# Patient Record
Sex: Male | Born: 1960 | Race: White | Hispanic: No | Marital: Married | Smoking: Former smoker
Health system: Southern US, Community
[De-identification: ages and names within clinical notes are randomized; demographics above are authoritative.]

## PROBLEM LIST (undated history)

## (undated) DIAGNOSIS — I70221 Atherosclerosis of native arteries of extremities with rest pain, right leg: Secondary | ICD-10-CM

## (undated) DIAGNOSIS — L03031 Cellulitis of right toe: Secondary | ICD-10-CM

## (undated) DIAGNOSIS — J45909 Unspecified asthma, uncomplicated: Secondary | ICD-10-CM

## (undated) DIAGNOSIS — L02611 Cutaneous abscess of right foot: Secondary | ICD-10-CM

## (undated) DIAGNOSIS — F419 Anxiety disorder, unspecified: Secondary | ICD-10-CM

## (undated) DIAGNOSIS — F17200 Nicotine dependence, unspecified, uncomplicated: Secondary | ICD-10-CM

## (undated) DIAGNOSIS — I739 Peripheral vascular disease, unspecified: Secondary | ICD-10-CM

## (undated) DIAGNOSIS — I70219 Atherosclerosis of native arteries of extremities with intermittent claudication, unspecified extremity: Secondary | ICD-10-CM

## (undated) HISTORY — PX: TONSILLECTOMY: SUR1361

## (undated) HISTORY — PX: TONSILECTOMY, ADENOIDECTOMY, BILATERAL MYRINGOTOMY AND TUBES: SHX2538

## (undated) HISTORY — PX: STOMACH SURGERY: SHX791

## (undated) HISTORY — PX: BACK SURGERY: SHX140

## (undated) HISTORY — DX: Peripheral vascular disease, unspecified: I73.9

---

## 2005-11-02 ENCOUNTER — Emergency Department: Payer: Self-pay | Admitting: Emergency Medicine

## 2012-06-12 ENCOUNTER — Emergency Department: Payer: Self-pay | Admitting: Emergency Medicine

## 2016-10-25 ENCOUNTER — Other Ambulatory Visit: Payer: Self-pay | Admitting: Internal Medicine

## 2016-10-25 ENCOUNTER — Ambulatory Visit (HOSPITAL_COMMUNITY): Payer: Self-pay

## 2016-10-25 DIAGNOSIS — R0602 Shortness of breath: Secondary | ICD-10-CM

## 2019-11-11 ENCOUNTER — Other Ambulatory Visit (INDEPENDENT_AMBULATORY_CARE_PROVIDER_SITE_OTHER): Payer: Self-pay | Admitting: Nurse Practitioner

## 2019-11-11 ENCOUNTER — Encounter (INDEPENDENT_AMBULATORY_CARE_PROVIDER_SITE_OTHER): Payer: Self-pay | Admitting: Vascular Surgery

## 2019-11-11 ENCOUNTER — Encounter (INDEPENDENT_AMBULATORY_CARE_PROVIDER_SITE_OTHER): Payer: Self-pay

## 2019-11-11 ENCOUNTER — Other Ambulatory Visit: Payer: Self-pay

## 2019-11-11 ENCOUNTER — Ambulatory Visit (INDEPENDENT_AMBULATORY_CARE_PROVIDER_SITE_OTHER): Payer: Self-pay | Admitting: Vascular Surgery

## 2019-11-11 ENCOUNTER — Other Ambulatory Visit (INDEPENDENT_AMBULATORY_CARE_PROVIDER_SITE_OTHER): Payer: Self-pay | Admitting: Vascular Surgery

## 2019-11-11 DIAGNOSIS — I70213 Atherosclerosis of native arteries of extremities with intermittent claudication, bilateral legs: Secondary | ICD-10-CM

## 2019-11-11 DIAGNOSIS — R0989 Other specified symptoms and signs involving the circulatory and respiratory systems: Secondary | ICD-10-CM

## 2019-11-11 DIAGNOSIS — F17200 Nicotine dependence, unspecified, uncomplicated: Secondary | ICD-10-CM

## 2019-11-11 DIAGNOSIS — I70219 Atherosclerosis of native arteries of extremities with intermittent claudication, unspecified extremity: Secondary | ICD-10-CM | POA: Insufficient documentation

## 2019-11-11 NOTE — Progress Notes (Signed)
Patient ID: Brendan Williams, male   DOB: 07-09-1961, 59 y.o.   MRN: [id number]  Chief Complaint  Patient presents with  . New Patient (Initial Visit)    no femoral pulses    HPI Brendan Williams is a 58 y.o. male.  I am asked to see the patient by R. Whitten, PA-C for evaluation of nonpalpable femoral pulses.  Patient describes several months of worsening leg pain and weakness with activity.  He is starting to get to the point where his right foot and leg hurt all the time.  This was steadily progressive without a clear inciting event or causative factor.  No current open ulcerations or infection.  Has not really not sought medical care and has not had previous interventions to his lower extremities.  His primary care provider noticed no palpable pulses with his symptoms referred him for further evaluation and treatment.  His blood pressure has been high as of recent although he previously was reported to have good blood pressure.  He does smoke tobacco.  Had not sought medical care in many years so does not have an extensive past medical history.     PMH Tobacco use Leg pain   Family History  Problem Relation Age of Onset  . Leukemia Mother   . Heart disease Father   . Heart disease Sister   . Cancer Sister   . Cancer Brother     Social History   Tobacco Use  . Smoking status: Current Every Day Smoker    Packs/day: 1.00    Types: Cigarettes  . Smokeless tobacco: Never Used  Substance Use Topics  . Alcohol use: Not on file  . Drug use: Not on file     NKDA  Not currently on meds    REVIEW OF SYSTEMS (Negative unless checked)  Constitutional: [] Weight loss  [] Fever  [] Chills Cardiac: [] Chest pain   [] Chest pressure   [] Palpitations   [] Shortness of breath when laying flat   [] Shortness of breath at rest   [] Shortness of breath with exertion. Vascular:  [x] Pain in legs with walking   [] Pain in legs at rest   [] Pain in legs when laying flat   [x] Claudication    [x] Pain in feet when walking  [] Pain in feet at rest  [] Pain in feet when laying flat   [] History of DVT   [] Phlebitis   [] Swelling in legs   [] Varicose veins   [] Non-healing ulcers Pulmonary:   [] Uses home oxygen   [] Productive cough   [] Hemoptysis   [] Wheeze  [] COPD   [] Asthma Neurologic:  [] Dizziness  [] Blackouts   [] Seizures   [] History of stroke   [] History of TIA  [] Aphasia   [] Temporary blindness   [] Dysphagia   [] Weakness or numbness in arms   [x] Weakness or numbness in legs Musculoskeletal:  [] Arthritis   [] Joint swelling   [] Joint pain   [] Low back pain Hematologic:  [] Easy bruising  [] Easy bleeding   [] Hypercoagulable state   [] Anemic  [] Hepatitis Gastrointestinal:  [] Blood in stool   [] Vomiting blood  [] Gastroesophageal reflux/heartburn   [] Abdominal pain Genitourinary:  [] Chronic kidney disease   [] Difficult urination  [] Frequent urination  [] Burning with urination   [] Hematuria Skin:  [] Rashes   [] Ulcers   [] Wounds Psychological:  [] History of anxiety   []  History of major depression.    Physical Exam BP 132/84 (BP Location: Right Arm)   Pulse (!) 59   Resp 14   Ht 6' (1.829 m)  Wt 173 lb (78.5 kg)   BMI 23.46 kg/m  Gen:  WD/WN, NAD Head: [place]/AT, No temporalis wasting. Ear/Nose/Throat: Hearing grossly intact, nares w/o erythema or drainage, oropharynx w/o Erythema/Exudate Eyes: Conjunctiva clear, sclera non-icteric  Neck: trachea midline.  No JVD. No bruit Pulmonary:  Good air movement, respirations not labored, no use of accessory muscles  Cardiac: RRR, no JVD Vascular:  Vessel Right Left  Radial Palpable Palpable                      Fem  NP  NP  DP  NP  NP  PT  NP  NP   Gastrointestinal:. No masses, surgical incisions, or scars. Musculoskeletal: M/S 5/5 throughout.  No deformity or atrophy.  No edema. Neurologic: Sensation grossly intact in extremities.  Symmetrical.  Speech is fluent. Motor exam as listed above. Psychiatric: Judgment intact, Mood & affect  appropriate for pt's clinical situation. Dermatologic: No rashes or ulcers noted.  No cellulitis or open wounds.    Radiology No results found.  Labs No results found for this or any previous visit (from the past 2160 hour(s)).  Assessment/Plan:  Tobacco use disorder We had a discussion for approximately 3 minutes regarding the absolute need for smoking cessation due to the deleterious nature of tobacco on the vascular system. We discussed the tobacco use would diminish patency of any intervention, and likely significantly worsen progressio of disease. We discussed multiple agents for quitting including replacement therapy or medications to reduce cravings such as Chantix. The patient voices their understanding of the importance of smoking cessation.   Atherosclerosis of native arteries of extremity with intermittent claudication (HCC) The patient describes symptoms consistent with atherosclerosis with claudication of both lower extremities worse on the right than the left.  He sounds like he is getting rest pain on the right leg as well.  I discussed the pathophysiology and natural history of peripheral arterial disease and why causes symptoms.  I discussed other diagnoses in the differential such as neurogenic claudication.  Recommend:  Patient should undergo arterial duplex of the lower extremity ASAP because there has been a significant deterioration in the patient's lower extremity symptoms.  The patient states they are having increased pain and a marked decrease in the distance that they can walk.  The risks and benefits as well as the alternatives were discussed in detail with the patient.  All questions were answered.  Patient agrees to proceed and understands this could be a prelude to angiography and intervention.  The patient will follow up with me in the office to review the studies.       Festus Barren 11/11/2019, 11:37 AM   This note was created with Dragon medical  transcription system.  Any errors from dictation are unintentional.

## 2019-11-11 NOTE — Assessment & Plan Note (Signed)

## 2019-11-11 NOTE — Patient Instructions (Signed)
Peripheral Vascular Disease  Peripheral vascular disease (PVD) is a disease of the blood vessels that are not part of your heart and brain. A simple term for PVD is poor circulation. In most cases, PVD narrows the blood vessels that carry blood from your heart to the rest of your body. This can reduce the supply of blood to your arms, legs, and internal organs, like your stomach or kidneys. However, PVD most often affects a person's lower legs and feet. Without treatment, PVD tends to get worse. PVD can also lead to acute ischemic limb. This is when an arm or leg suddenly cannot get enough blood. This is a medical emergency. Follow these instructions at home: Lifestyle  Do not use any products that contain nicotine or tobacco, such as cigarettes and e-cigarettes. If you need help quitting, ask your doctor.  Lose weight if you are overweight. Or, stay at a healthy weight as told by your doctor.  Eat a diet that is low in fat and cholesterol. If you need help, ask your doctor.  Exercise regularly. Ask your doctor for activities that are right for you. General instructions  Take over-the-counter and prescription medicines only as told by your doctor.  Take good care of your feet: ? Wear comfortable shoes that fit well. ? Check your feet often for any cuts or sores.  Keep all follow-up visits as told by your doctor This is important. Contact a doctor if:  You have cramps in your legs when you walk.  You have leg pain when you are at rest.  You have coldness in a leg or foot.  Your skin changes.  You are unable to get or have an erection (erectile dysfunction).  You have cuts or sores on your feet that do not heal. Get help right away if:  Your arm or leg turns cold, numb, and blue.  Your arms or legs become red, warm, swollen, painful, or numb.  You have chest pain.  You have trouble breathing.  You suddenly have weakness in your face, arm, or leg.  You become very  confused or you cannot speak.  You suddenly have a very bad headache.  You suddenly cannot see. Summary  Peripheral vascular disease (PVD) is a disease of the blood vessels.  A simple term for PVD is poor circulation. Without treatment, PVD tends to get worse.  Treatment may include exercise, low fat and low cholesterol diet, and quitting smoking. This information is not intended to replace advice given to you by your health care provider. Make sure you discuss any questions you have with your health care provider. Document Revised: 09/14/2017 Document Reviewed: 11/09/2016 Elsevier Patient Education  2020 [employer]  

## 2019-11-11 NOTE — Assessment & Plan Note (Addendum)
The patient describes symptoms consistent with atherosclerosis with claudication of both lower extremities worse on the right than the left.  He sounds like he is getting rest pain on the right leg as well.  I discussed the pathophysiology and natural history of peripheral arterial disease and why causes symptoms.  I discussed other diagnoses in the differential such as neurogenic claudication.  Recommend:  Patient should undergo arterial duplex of the lower extremity ASAP because there has been a significant deterioration in the patient's lower extremity symptoms.  The patient states they are having increased pain and a marked decrease in the distance that they can walk.  The risks and benefits as well as the alternatives were discussed in detail with the patient.  All questions were answered.  Patient agrees to proceed and understands this could be a prelude to angiography and intervention.  The patient will follow up with me in the office to review the studies.

## 2019-11-12 ENCOUNTER — Encounter (INDEPENDENT_AMBULATORY_CARE_PROVIDER_SITE_OTHER): Payer: Self-pay | Admitting: Nurse Practitioner

## 2019-11-12 ENCOUNTER — Ambulatory Visit (INDEPENDENT_AMBULATORY_CARE_PROVIDER_SITE_OTHER): Payer: Self-pay

## 2019-11-12 ENCOUNTER — Ambulatory Visit (INDEPENDENT_AMBULATORY_CARE_PROVIDER_SITE_OTHER): Payer: Self-pay | Admitting: Nurse Practitioner

## 2019-11-12 ENCOUNTER — Encounter (INDEPENDENT_AMBULATORY_CARE_PROVIDER_SITE_OTHER): Payer: Self-pay

## 2019-11-12 VITALS — BP 102/75 | HR 55 | Resp 16 | Wt 175.0 lb

## 2019-11-12 DIAGNOSIS — I70213 Atherosclerosis of native arteries of extremities with intermittent claudication, bilateral legs: Secondary | ICD-10-CM

## 2019-11-12 DIAGNOSIS — F17200 Nicotine dependence, unspecified, uncomplicated: Secondary | ICD-10-CM

## 2019-11-12 DIAGNOSIS — I771 Stricture of artery: Secondary | ICD-10-CM

## 2019-11-12 DIAGNOSIS — R0989 Other specified symptoms and signs involving the circulatory and respiratory systems: Secondary | ICD-10-CM

## 2019-11-13 ENCOUNTER — Other Ambulatory Visit: Payer: Self-pay

## 2019-11-13 ENCOUNTER — Other Ambulatory Visit
Admission: RE | Admit: 2019-11-13 | Discharge: 2019-11-13 | Disposition: A | Discharge status: Home / Self Care | Payer: HRSA Program | Source: Ambulatory Visit | Attending: Vascular Surgery | Admitting: Vascular Surgery

## 2019-11-13 DIAGNOSIS — Z20822 Contact with and (suspected) exposure to covid-19: Secondary | ICD-10-CM | POA: Diagnosis not present

## 2019-11-13 DIAGNOSIS — Z01812 Encounter for preprocedural laboratory examination: Secondary | ICD-10-CM | POA: Diagnosis present

## 2019-11-13 LAB — SARS CORONAVIRUS 2 (TAT 6-24 HRS): SARS Coronavirus 2: NEGATIVE

## 2019-11-14 ENCOUNTER — Encounter (INDEPENDENT_AMBULATORY_CARE_PROVIDER_SITE_OTHER): Payer: Self-pay | Admitting: Nurse Practitioner

## 2019-11-14 ENCOUNTER — Encounter
Admission: RE | Disposition: A | Discharge status: Home / Self Care | Payer: Self-pay | Source: Home / Self Care | Attending: Vascular Surgery

## 2019-11-14 ENCOUNTER — Other Ambulatory Visit: Payer: Self-pay

## 2019-11-14 ENCOUNTER — Ambulatory Visit
Admission: RE | Admit: 2019-11-14 | Discharge: 2019-11-14 | Disposition: A | Discharge status: Home / Self Care | Payer: Self-pay | Attending: Vascular Surgery | Admitting: Vascular Surgery

## 2019-11-14 ENCOUNTER — Encounter: Payer: Self-pay | Admitting: Vascular Surgery

## 2019-11-14 DIAGNOSIS — I70223 Atherosclerosis of native arteries of extremities with rest pain, bilateral legs: Secondary | ICD-10-CM

## 2019-11-14 DIAGNOSIS — I70229 Atherosclerosis of native arteries of extremities with rest pain, unspecified extremity: Secondary | ICD-10-CM

## 2019-11-14 DIAGNOSIS — F17210 Nicotine dependence, cigarettes, uncomplicated: Secondary | ICD-10-CM | POA: Insufficient documentation

## 2019-11-14 HISTORY — PX: LOWER EXTREMITY ANGIOGRAPHY: CATH118251

## 2019-11-14 LAB — CREATININE, SERUM
Creatinine, Ser: 0.78 mg/dL (ref 0.61–1.24)
GFR calc Af Amer: >60 mL/min (ref >60)
GFR calc non Af Amer: >60 mL/min (ref >60)

## 2019-11-14 LAB — BUN: BUN: 7 mg/dL (ref 6–20)

## 2019-11-14 SURGERY — LOWER EXTREMITY ANGIOGRAPHY
Anesthesia: Moderate Sedation | Site: Leg Lower | Laterality: Right

## 2019-11-14 MED ORDER — CEFAZOLIN SODIUM-DEXTROSE 2-4 GM/100ML-% IV SOLN
2.0000 g | Freq: Once | INTRAVENOUS | Status: AC
  Administered 2019-11-14: 2 g via INTRAVENOUS

## 2019-11-14 MED ORDER — ATORVASTATIN CALCIUM 10 MG PO TABS: 10.0000 mg | ORAL_TABLET | Freq: Every day | ORAL | 11 refills | Status: DC

## 2019-11-14 MED ORDER — ONDANSETRON HCL 4 MG/2ML IJ SOLN: 4.0000 mg | Freq: Four times a day (QID) | INTRAMUSCULAR | Status: DC | PRN

## 2019-11-14 MED ORDER — ASPIRIN EC 81 MG PO TBEC: 81.0000 mg | DELAYED_RELEASE_TABLET | Freq: Every day | ORAL | 2 refills | Status: DC

## 2019-11-14 MED ORDER — SODIUM CHLORIDE 0.9 % IV SOLN: INTRAVENOUS | Status: DC

## 2019-11-14 MED ORDER — DIPHENHYDRAMINE HCL 50 MG/ML IJ SOLN
INTRAMUSCULAR | Status: DC | PRN
  Administered 2019-11-14: 50 mg via INTRAVENOUS

## 2019-11-14 MED ORDER — SODIUM CHLORIDE 0.9% FLUSH: 3.0000 mL | INTRAVENOUS | Status: DC | PRN

## 2019-11-14 MED ORDER — DIPHENHYDRAMINE HCL 50 MG/ML IJ SOLN
INTRAMUSCULAR | Status: AC
  Filled 2019-11-14: qty 1

## 2019-11-14 MED ORDER — CLOPIDOGREL BISULFATE 75 MG PO TABS
ORAL_TABLET | ORAL | Status: AC
  Administered 2019-11-14: 300 mg via ORAL
  Filled 2019-11-14: qty 4

## 2019-11-14 MED ORDER — HEPARIN SODIUM (PORCINE) 1000 UNIT/ML IJ SOLN
INTRAMUSCULAR | Status: AC
  Filled 2019-11-14: qty 1

## 2019-11-14 MED ORDER — ATORVASTATIN CALCIUM 10 MG PO TABS
10.0000 mg | ORAL_TABLET | Freq: Every day | ORAL | Status: DC
  Administered 2019-11-14: 10 mg via ORAL
  Filled 2019-11-14: qty 1

## 2019-11-14 MED ORDER — ASPIRIN EC 81 MG PO TBEC
DELAYED_RELEASE_TABLET | ORAL | Status: AC
  Administered 2019-11-14: 81 mg via ORAL
  Filled 2019-11-14: qty 1

## 2019-11-14 MED ORDER — SODIUM CHLORIDE 0.9 % IV SOLN: 250.0000 mL | INTRAVENOUS | Status: DC | PRN

## 2019-11-14 MED ORDER — SODIUM CHLORIDE 0.9% FLUSH: 3.0000 mL | Freq: Two times a day (BID) | INTRAVENOUS | Status: DC

## 2019-11-14 MED ORDER — FENTANYL CITRATE (PF) 100 MCG/2ML IJ SOLN
INTRAMUSCULAR | Status: AC
  Filled 2019-11-14: qty 2

## 2019-11-14 MED ORDER — HYDRALAZINE HCL 20 MG/ML IJ SOLN: 5.0000 mg | INTRAMUSCULAR | Status: DC | PRN

## 2019-11-14 MED ORDER — HYDROMORPHONE HCL 1 MG/ML IJ SOLN: 1.0000 mg | Freq: Once | INTRAMUSCULAR | Status: AC | PRN

## 2019-11-14 MED ORDER — ASPIRIN EC 81 MG PO TBEC: 81.0000 mg | DELAYED_RELEASE_TABLET | Freq: Every day | ORAL | Status: DC

## 2019-11-14 MED ORDER — ACETAMINOPHEN 325 MG PO TABS: 650.0000 mg | ORAL_TABLET | ORAL | Status: DC | PRN

## 2019-11-14 MED ORDER — CLOPIDOGREL BISULFATE 300 MG PO TABS: 300.0000 mg | ORAL_TABLET | Freq: Once | ORAL | 0 refills | Status: AC

## 2019-11-14 MED ORDER — METHYLPREDNISOLONE SODIUM SUCC 125 MG IJ SOLR: 125.0000 mg | Freq: Once | INTRAMUSCULAR | Status: DC | PRN

## 2019-11-14 MED ORDER — MIDAZOLAM HCL 2 MG/2ML IJ SOLN
INTRAMUSCULAR | Status: DC | PRN
  Administered 2019-11-14 (×3): 1 mg via INTRAVENOUS
  Administered 2019-11-14: 2 mg via INTRAVENOUS

## 2019-11-14 MED ORDER — HYDROMORPHONE HCL 1 MG/ML IJ SOLN
INTRAMUSCULAR | Status: AC
  Administered 2019-11-14: 1 mg via INTRAVENOUS
  Filled 2019-11-14: qty 1

## 2019-11-14 MED ORDER — DIPHENHYDRAMINE HCL 50 MG/ML IJ SOLN: 50.0000 mg | Freq: Once | INTRAMUSCULAR | Status: DC | PRN

## 2019-11-14 MED ORDER — LABETALOL HCL 5 MG/ML IV SOLN: 10.0000 mg | INTRAVENOUS | Status: DC | PRN

## 2019-11-14 MED ORDER — HEPARIN SODIUM (PORCINE) 1000 UNIT/ML IJ SOLN
INTRAMUSCULAR | Status: DC | PRN
  Administered 2019-11-14: 5000 [IU] via INTRAVENOUS

## 2019-11-14 MED ORDER — CLOPIDOGREL BISULFATE 75 MG PO TABS: 75.0000 mg | ORAL_TABLET | Freq: Every day | ORAL | Status: DC

## 2019-11-14 MED ORDER — FAMOTIDINE 20 MG PO TABS: 40.0000 mg | ORAL_TABLET | Freq: Once | ORAL | Status: DC | PRN

## 2019-11-14 MED ORDER — CLOPIDOGREL BISULFATE 75 MG PO TABS: 75.0000 mg | ORAL_TABLET | Freq: Every day | ORAL | 11 refills | Status: DC

## 2019-11-14 MED ORDER — ONDANSETRON HCL 4 MG/2ML IJ SOLN
INTRAMUSCULAR | Status: AC
  Administered 2019-11-14: 4 mg via INTRAVENOUS
  Filled 2019-11-14: qty 2

## 2019-11-14 MED ORDER — MIDAZOLAM HCL 5 MG/5ML IJ SOLN
INTRAMUSCULAR | Status: AC
  Filled 2019-11-14: qty 5

## 2019-11-14 MED ORDER — CLOPIDOGREL BISULFATE 75 MG PO TABS
ORAL_TABLET | ORAL | Status: AC
  Filled 2019-11-14: qty 1

## 2019-11-14 MED ORDER — MIDAZOLAM HCL 2 MG/ML PO SYRP: 8.0000 mg | ORAL_SOLUTION | Freq: Once | ORAL | Status: DC | PRN

## 2019-11-14 MED ORDER — FENTANYL CITRATE (PF) 100 MCG/2ML IJ SOLN
INTRAMUSCULAR | Status: DC | PRN
  Administered 2019-11-14 (×6): 25 ug via INTRAVENOUS
  Administered 2019-11-14: 50 ug via INTRAVENOUS

## 2019-11-14 MED ORDER — CLOPIDOGREL BISULFATE 75 MG PO TABS: 300.0000 mg | ORAL_TABLET | Freq: Once | ORAL | Status: AC

## 2019-11-14 SURGICAL SUPPLY — 28 items
BALLN LUTONIX DCB 5X100X130 (BALLOONS) ×2
BALLN LUTONIX DCB 6X100X130 (BALLOONS) ×2
BALLN ULTRVRSE 7X150X75 (BALLOONS) ×2
BALLN ULTRVRSE 7X80X75 (BALLOONS) ×2
BALLOON LUTONIX DCB 5X100X130 (BALLOONS) ×1 IMPLANT
BALLOON LUTONIX DCB 6X100X130 (BALLOONS) ×1 IMPLANT
BALLOON ULTRVRSE 7X150X75 (BALLOONS) ×1 IMPLANT
BALLOON ULTRVRSE 7X80X75 (BALLOONS) ×1 IMPLANT
CANNULA 5F STIFF (CANNULA) ×2 IMPLANT
CATH BEACON 5 .035 40 KMP TP (CATHETERS) ×1 IMPLANT
CATH BEACON 5 .035 65 RIM TIP (CATHETERS) ×2 IMPLANT
CATH BEACON 5 .038 40 KMP TP (CATHETERS) ×1
CATH PIG 70CM (CATHETERS) ×2 IMPLANT
DEVICE PRESTO INFLATION (MISCELLANEOUS) ×4 IMPLANT
DEVICE STARCLOSE SE CLOSURE (Vascular Products) ×2 IMPLANT
GLIDEWIRE ADV .035X180CM (WIRE) ×2 IMPLANT
PACK ANGIOGRAPHY (CUSTOM PROCEDURE TRAY) ×2 IMPLANT
SHEATH BRITE TIP 5FRX11 (SHEATH) ×2 IMPLANT
SHEATH BRITE TIP 6FRX11 (SHEATH) ×4 IMPLANT
SHEATH BRITE TIP 7FRX11 (SHEATH) ×2 IMPLANT
STENT LIFESTAR 8X60X80 (Permanent Stent) ×2 IMPLANT
STENT LIFESTAR 8X80X80 (Permanent Stent) ×4 IMPLANT
STENT LIFESTREAM 7X26X80 (Permanent Stent) ×2 IMPLANT
STENT LIFESTREAM 8X26X80 (Permanent Stent) ×2 IMPLANT
SYR MEDRAD MARK 7 150ML (SYRINGE) ×2 IMPLANT
TUBING CONTRAST HIGH PRESS 72 (TUBING) ×2 IMPLANT
WIRE J 3MM .035X145CM (WIRE) ×2 IMPLANT
WIRE MAGIC TOR.035 180C (WIRE) ×2 IMPLANT

## 2019-11-14 NOTE — Progress Notes (Signed)
Pt pulses absent in bilateral feet x 2. Dr. Wyn Quaker at bedside aware. No orders at this time.  Per MD [facility] for Patient to D/C after 2 hrs bedrest if pain controlled. Will monitor closely.

## 2019-11-14 NOTE — Progress Notes (Signed)
SUBJECTIVE:  Patient ID: Brendan Williams, male    DOB: [birth year], 59 y.o.   MRN: [id number] Chief Complaint  Patient presents with  . Follow-up    stat ultrasound follow up    HPI  Brendan Williams is a 59 y.o. male that presents today for follow-up stat ultrasound studies.  The patient was referred by his primary care provider due to having nonpalpable femoral pulses.  The patient has had several months of worsening leg pain and weakness with the right leg being far worse than the left.  He states that the pain happens when he walks and he has a  very short ambulation distance before he begins to experience claudication.  He denies any previous interventions.  The patient also smokes tobacco on a regular basis.  Today the patient has an ABI of 0.60 on the right and 0.75 on the left.  The patient has a toe pressure of 48 on the right and 66 on the left.  Limited duplex shows dampened monophasic flow in the common femoral, superficial femoral and popliteal arteries with monophasic flow reversal in the profundofemoral artery via collateralization.  There is no flow detected in the external iliac artery region.  The left lower extremity shows monophasic flow in the left common femoral, profunda, superficial femoral and popliteal arteries.  No flow was visualized in the external iliac artery region with prominent superficial collateral noted.  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    History reviewed. No pertinent past medical history.  Past Surgical History:  Procedure Laterality Date  . BACK SURGERY     lower ruptured disk  . STOMACH SURGERY    . TONSILECTOMY, ADENOIDECTOMY, BILATERAL MYRINGOTOMY AND TUBES      Social History   Socioeconomic History  . Marital status: Married    Spouse name: Not on file  . Number of children: Not on file  . Years of education: Not on file  . Highest education level: Not on file  Occupational History  . Not on file    Tobacco Use  . Smoking status: Current Every Day Smoker    Packs/day: 1.00    Types: Cigarettes  . Smokeless tobacco: Never Used  Substance and Sexual Activity  . Alcohol use: Not on file  . Drug use: Not on file  . Sexual activity: Not on file  Other Topics Concern  . Not on file  Social History Narrative  . Not on file   Social Determinants of Health   Financial Resource Strain:   . Difficulty of Paying Living Expenses: Not on file  Food Insecurity:   . Worried About Charity fundraiser in the Last Year: Not on file  . Ran Out of Food in the Last Year: Not on file  Transportation Needs:   . Lack of Transportation (Medical): Not on file  . Lack of Transportation (Non-Medical): Not on file  Physical Activity:   . Days of Exercise per Week: Not on file  . Minutes of Exercise per Session: Not on file  Stress:   . Feeling of Stress : Not on file  Social Connections:   . Frequency of Communication with Friends and Family: Not on file  . Frequency of Social Gatherings with Friends and Family: Not on file  . Attends Religious Services: Not on file  . Active Member of Clubs or Organizations: Not on file  . Attends Archivist Meetings: Not on file  . Marital Status: Not  on file  Intimate Partner Violence:   . Fear of Current or Ex-Partner: Not on file  . Emotionally Abused: Not on file  . Physically Abused: Not on file  . Sexually Abused: Not on file    Family History  Problem Relation Age of Onset  . Leukemia Mother   . Heart disease Father   . Heart disease Sister   . Cancer Sister   . Cancer Brother     No Known Allergies   Review of Systems   Review of Systems: Negative Unless Checked Constitutional: [] Weight loss  [] Fever  [] Chills Cardiac: [] Chest pain   []  Atrial Fibrillation  [] Palpitations   [] Shortness of breath when laying flat   [] Shortness of breath with exertion. [] Shortness of breath at rest Vascular:  [x] Pain in legs with walking    [x] Pain in legs with standing [x] Pain in legs when laying flat   [x] Claudication    [] Pain in feet when laying flat    [] History of DVT   [] Phlebitis   [] Swelling in legs   [] Varicose veins   [] Non-healing ulcers Pulmonary:   [] Uses home oxygen   [] Productive cough   [] Hemoptysis   [] Wheeze  [] COPD   [] Asthma Neurologic:  [] Dizziness   [] Seizures  [] Blackouts [] History of stroke   [] History of TIA  [] Aphasia   [] Temporary Blindness   [] Weakness or numbness in arm   [] Weakness or numbness in leg Musculoskeletal:   [] Joint swelling   [] Joint pain   [] Low back pain  []  History of Knee Replacement [] Arthritis [] back Surgeries  []  Spinal Stenosis    Hematologic:  [] Easy bruising  [] Easy bleeding   [] Hypercoagulable state   [] Anemic Gastrointestinal:  [] Diarrhea   [] Vomiting  [] Gastroesophageal reflux/heartburn   [] Difficulty swallowing. [] Abdominal pain Genitourinary:  [] Chronic kidney disease   [] Difficult urination  [] Anuric   [] Blood in urine [] Frequent urination  [] Burning with urination   [] Hematuria Skin:  [] Rashes   [] Ulcers [] Wounds Psychological:  [] History of anxiety   []  History of major depression  []  Memory Difficulties      OBJECTIVE:   Physical Exam  BP 102/75 (BP Location: Right Arm)   Pulse (!) 55   Resp 16   Wt 175 lb (79.4 kg)   BMI 23.73 kg/m   Gen: WD/WN, NAD Head: [place]/AT, No temporalis wasting.  Ear/Nose/Throat: Hearing grossly intact, nares w/o erythema or drainage Eyes: PER, EOMI, sclera nonicteric.  Neck: Supple, no masses.  No JVD.  Pulmonary:  Good air movement, no use of accessory muscles.  Cardiac: RRR Vascular:  Vessel Right Left  Radial Palpable Palpable  Dorsalis Pedis Not Palpable Not Palpable  Posterior Tibial Not Palpable Not Palpable   Gastrointestinal: soft, non-distended. No guarding/no peritoneal signs.  Musculoskeletal: M/S 5/5 throughout.  No deformity or atrophy.  Neurologic: Pain and light touch intact in extremities.  Symmetrical.  Speech is  fluent. Motor exam as listed above. Psychiatric: Judgment intact, Mood & affect appropriate for pt's clinical situation. Dermatologic: No Venous rashes. No Ulcers Noted.  No changes consistent with cellulitis. Lymph : No Cervical lymphadenopathy, no lichenification or skin changes of chronic lymphedema.       ASSESSMENT AND PLAN:  1. Atherosclerosis of native artery of both lower extremities with intermittent claudication (HCC) Recommend:  The patient has evidence of severe atherosclerotic changes of both lower extremities with rest pain that is associated with preulcerative changes and impending tissue loss of the foot.  This represents a limb threatening ischemia and places the patient  at the risk for limb loss.  Patient should undergo angiography of the lower extremities with the hope for intervention for limb salvage.  The risks and benefits as well as the alternative therapies was discussed in detail with the patient.  All questions were answered.  Patient agrees to proceed with angiography.  The patient will follow up with me in the office after the procedure.       2. Tobacco use disorder Smoking cessation was discussed, 3-10 minutes spent on this topic specifically   3. Subclavian artery stenosis (HCC) >30 mmHG difference in the bilateral arm blood pressures.  Patient has no complaints today but his leg is taking a more urgent precedent.  Will re assess at follow up visit   No current outpatient medications on file prior to visit.   No current facility-administered medications on file prior to visit.    There are no Patient Instructions on file for this visit. No follow-ups on file.   Georgiana Spinner, NP  This note was completed with Office manager.  Any errors are purely unintentional.

## 2019-11-14 NOTE — Op Note (Signed)
[facility]  Percutaneous Study/Intervention Procedural Note   Date of Surgery: 11/14/2019  Surgeon(s):Deedee Lybarger    Assistants:none  Pre-operative Diagnosis: PAD with rest pain bilateral lower extremities  Post-operative diagnosis:  Same  Procedure(s) Performed:             1.  Ultrasound guidance for vascular access bilateral femoral artery             2.  Catheter placement into aorta from bilateral femoral approaches             3.  Aortogram and selective bilateral lower extremity angiogram             4.  Percutaneous transluminal angioplasty of right external iliac artery with 6 mm diameter Lutonix drug-coated balloon             5.   Percutaneous transluminal angioplasty of the left external iliac artery with 5 mm diameter Lutonix drug-coated balloon  6.  Kissing balloon stent placements to bilateral common iliac arteries with an 8 mm diameter by 26 mm length stent on the right and a 7 mm diameter by 26 mm length stent on the left  7.  Additional stent placement to the right external iliac artery with 8 mm diameter by 8 cm length life star stent  8.  Additional stent placement to the left common and external iliac artery with an 8 mm diameter by 8 cm length life star stent into the left external iliac artery with a 7 mm diameter by 6 cm length life star stent  9.  Angioplasty of the right common femoral artery with 7 mm diameter angioplasty balloon             10.  StarClose closure device left femoral artery  EBL: 10 cc  Contrast: 110 cc  Fluoro Time: 19.7 minutes  Moderate Conscious Sedation Time: approximately 50 minutes using 5 mg of Versed and 200 mcg of Fentanyl              Indications:  Patient is a 59 y.o.male with rest pain in both legs. The patient has noninvasive study showing very poor monophasic flow bilaterally. The patient is brought in for angiography for further evaluation and potential treatment.  Due to the limb threatening nature  of the situation, angiogram was performed for attempted limb salvage. The patient is aware that if the procedure fails, amputation would be expected.  The patient also understands that even with successful revascularization, amputation may still be required due to the severity of the situation.  Risks and benefits are discussed and informed consent is obtained.   Procedure:  The patient was identified and appropriate procedural time out was performed.  The patient was then placed supine on the table and prepped and draped in the usual sterile fashion. Moderate conscious sedation was administered during a face to face encounter with the patient throughout the procedure with my supervision of the RN administering medicines and monitoring the patient's vital signs, pulse oximetry, telemetry and mental status throughout from the start of the procedure until the patient was taken to the recovery room. Ultrasound was used to evaluate the left common femoral artery.  It was patent but significantly diseased.  A digital ultrasound image was acquired.  A micropuncture puncture needle was used to access the left common femoral artery under direct ultrasound guidance and a permanent image was performed.   A micropuncture wire and sheath were then placed but these were only  go and partially.  There was an occlusion down to the distal external iliac artery and I then crossed this with an advantage wire a 5Fr sheath was placed.  Pigtail catheter was placed into the aorta and an AP aortogram was performed to crossing the occlusion. This demonstrated extensive iliac disease with occlusion of both external iliac arteries and high-grade stenosis versus occlusion of the common iliac arteries.  The renal arteries were widely patent.  The aorta itself was patent but it was disease distally.  Attempts at crossing the right iliac occlusion from the left femoral approach were not successful, so I elected to access the right femoral  artery.  This was done in the distal common femoral artery.  This was done with ultrasound and the artery was accessed under direct visualization with a micropuncture needle.  A micropuncture wire and sheath were placed and upsized to a 6 [place] sheath.  Using the advantage wire and a Kumpe catheter was able to cross the iliac occlusion and confirm intraluminal flow in the aorta.  Selective bilateral lower extremity angiogram was then performed through the existing sheaths. This demonstrated short segment occlusion in the left SFA with what appeared to be more significant disease in the left popliteal artery.  Distal opacification was extremely poor but there appeared to be at least 1 vessel runoff.  On the right side, there was a long segment right SFA occlusion, occlusion of the right common femoral artery below the sheath and involving the origins of the profunda femoris artery and proximal superficial femoral artery.  Distal opacification was extremely poor and nothing was seen below the knee to assess his runoff due to the poor inflow. It was felt that it was in the patient's best interest to proceed with intervention after these images to avoid a second procedure and a larger amount of contrast and fluoroscopy based off of the findings from the initial angiogram. The patient was systemically heparinized. I then used a Kumpe catheter and the advantage wire to get into the aorta bilaterally and confirm intraluminal flow.  The external iliac arteries were heavily calcified and predilatation with a 5 mm Lutonix drug-coated balloon and a 6 mm diameter Lutonix drug-coated balloon were performed on the left and right respectively.  We then proceeded with stent placement in a kissing stent fashion in the common iliac artery for the proximal disease that was high-grade on the right and nearly occlusive on the left.  An 8 mm diameter by 26 mm length stent was placed on the right and a 7 mm diameter by 26 mm length stent  was placed on the left and they were both inflated to 14 atm.  The remainder of the right common iliac artery down to the bifurcation was normal but the external remained highly stenotic after angioplasty.  An 8 mm diameter by 8 cm length life star stent was then deployed from the right iliac bifurcation down to just above the femoral head.  This was postdilated with a 7 mm balloon.  There remained occlusion of the femoral artery and I pulled the sheath back to the very distal femoral artery and used the 7 mm balloon to angioplasty the right common femoral artery in the proximal and mid segment inflated to 8 atm for 1 minute.  Completion imaging showed it now to be patent to the stent with less than 20% stenosis but there remained occlusion at and below the stent in the common femoral artery on the right.  This  involved the origins of the profunda and SFA.  As such, closure device was not deployed on the right and the sheath was removed with pressure held.  The remainder of the disease in the left leg was then addressed within the iliac arteries.  An 8 mm diameter by 8 cm length life star stent was deployed from the edge of the Lifestream stent down to the proximal external iliac artery.  A 7 mm diameter by 6 cm length life star stent was then deployed to the top of the femoral head bridging up to the stent in the proximal external iliac artery.  These were postdilated with 7 mm balloon with excellent angiographic completion result and less than 10% residual stenosis.. I elected to terminate the procedure. The sheath was removed and StarClose closure device was deployed in the left femoral artery with excellent hemostatic result. The patient was taken to the recovery room in stable condition having tolerated the procedure well.  Findings:               Aortogram:  Extensive iliac disease with occlusion of both external iliac arteries and high-grade stenosis versus occlusion of the common iliac arteries.  The  renal arteries were widely patent.  The aorta itself was patent but it was disease distally.             Right Lower Extremity:  there was a long segment right SFA occlusion, occlusion of the right common femoral artery below the sheath and involving the origins of the profunda femoris artery and proximal superficial femoral artery.  Distal opacification was extremely poor and nothing was seen below the knee to assess his runoff due to the poor inflow  Left Lower Extremity:  This demonstrated short segment occlusion in the left SFA with what appeared to be more significant disease in the left popliteal artery.  Distal opacification was extremely poor but there appeared to be at least 1 vessel runoff.  The common femoral artery appeared to be at least moderately diseased although it was difficult to discern with the sheath in place.   Disposition: Patient was taken to the recovery room in stable condition having tolerated the procedure well.  Complications: None  Leotis Pain 11/14/2019 4:46 PM   This note was created with Dragon Medical transcription system. Any errors in dictation are purely unintentional.

## 2019-11-14 NOTE — H&P (Signed)
[facility] History & Physical Update  The patient was interviewed and re-examined.  The patient's previous History and Physical has been reviewed and is unchanged.  There is no change in the plan of care. We plan to proceed with the scheduled procedure.  Festus Barren, MD  11/14/2019, 1:05 PM

## 2019-11-17 ENCOUNTER — Telehealth (INDEPENDENT_AMBULATORY_CARE_PROVIDER_SITE_OTHER): Payer: Self-pay | Admitting: Vascular Surgery

## 2019-11-17 ENCOUNTER — Encounter: Payer: Self-pay | Admitting: Cardiology

## 2019-11-18 ENCOUNTER — Telehealth (INDEPENDENT_AMBULATORY_CARE_PROVIDER_SITE_OTHER): Payer: Self-pay

## 2019-11-18 NOTE — Telephone Encounter (Signed)
Patient wife left voicemail stating that her husband had angio on 11/14/19 and was having pain. The wife is requesting for pain medication to be called into pharmacy for relief. I spoke with Dr Wyn Quaker and he advise I could call in Tramadol 50mg  1 tablet every 6 hours prn #30 with 1 refill and I have made the patient wife aware that prescription will be called into pharmacy.

## 2019-11-24 ENCOUNTER — Telehealth (INDEPENDENT_AMBULATORY_CARE_PROVIDER_SITE_OTHER): Payer: Self-pay

## 2019-11-24 NOTE — Telephone Encounter (Signed)
A referral has been sent to [facility] for a cardiac clearance for this patient for surgery with Dr. Wyn Quaker. Annice Needy, MD  Dominga Ferry, CMA  Patient will need bilateral femoral endarterectomies and bilateral SFA and possible tibial interventions

## 2019-11-25 ENCOUNTER — Other Ambulatory Visit (INDEPENDENT_AMBULATORY_CARE_PROVIDER_SITE_OTHER): Payer: Self-pay | Admitting: Vascular Surgery

## 2019-11-25 ENCOUNTER — Inpatient Hospital Stay
Admission: AD | Admit: 2019-11-25 | Discharge: 2019-12-04 | DRG: 271 | Disposition: A | Discharge status: Home Health | Payer: Self-pay | Source: Ambulatory Visit | Attending: Hospitalist | Admitting: Hospitalist

## 2019-11-25 ENCOUNTER — Ambulatory Visit (INDEPENDENT_AMBULATORY_CARE_PROVIDER_SITE_OTHER): Payer: Self-pay | Admitting: Vascular Surgery

## 2019-11-25 ENCOUNTER — Encounter (INDEPENDENT_AMBULATORY_CARE_PROVIDER_SITE_OTHER): Payer: Self-pay | Admitting: Vascular Surgery

## 2019-11-25 ENCOUNTER — Other Ambulatory Visit: Payer: Self-pay

## 2019-11-25 VITALS — BP 147/87 | HR 68 | Resp 14 | Wt 171.0 lb

## 2019-11-25 DIAGNOSIS — E785 Hyperlipidemia, unspecified: Secondary | ICD-10-CM | POA: Diagnosis present

## 2019-11-25 DIAGNOSIS — F17210 Nicotine dependence, cigarettes, uncomplicated: Secondary | ICD-10-CM | POA: Diagnosis present

## 2019-11-25 DIAGNOSIS — G9349 Other encephalopathy: Secondary | ICD-10-CM | POA: Diagnosis not present

## 2019-11-25 DIAGNOSIS — R509 Fever, unspecified: Secondary | ICD-10-CM

## 2019-11-25 DIAGNOSIS — R231 Pallor: Secondary | ICD-10-CM | POA: Diagnosis present

## 2019-11-25 DIAGNOSIS — I70229 Atherosclerosis of native arteries of extremities with rest pain, unspecified extremity: Secondary | ICD-10-CM | POA: Diagnosis present

## 2019-11-25 DIAGNOSIS — E440 Moderate protein-calorie malnutrition: Secondary | ICD-10-CM | POA: Diagnosis present

## 2019-11-25 DIAGNOSIS — I70221 Atherosclerosis of native arteries of extremities with rest pain, right leg: Secondary | ICD-10-CM

## 2019-11-25 DIAGNOSIS — E1152 Type 2 diabetes mellitus with diabetic peripheral angiopathy with gangrene: Secondary | ICD-10-CM | POA: Diagnosis present

## 2019-11-25 DIAGNOSIS — Z7982 Long term (current) use of aspirin: Secondary | ICD-10-CM

## 2019-11-25 DIAGNOSIS — Z79899 Other long term (current) drug therapy: Secondary | ICD-10-CM

## 2019-11-25 DIAGNOSIS — F1290 Cannabis use, unspecified, uncomplicated: Secondary | ICD-10-CM | POA: Diagnosis present

## 2019-11-25 DIAGNOSIS — I70261 Atherosclerosis of native arteries of extremities with gangrene, right leg: Secondary | ICD-10-CM | POA: Diagnosis present

## 2019-11-25 DIAGNOSIS — T82856A Stenosis of peripheral vascular stent, initial encounter: Secondary | ICD-10-CM | POA: Diagnosis present

## 2019-11-25 DIAGNOSIS — B9562 Methicillin resistant Staphylococcus aureus infection as the cause of diseases classified elsewhere: Secondary | ICD-10-CM | POA: Diagnosis present

## 2019-11-25 DIAGNOSIS — I739 Peripheral vascular disease, unspecified: Secondary | ICD-10-CM | POA: Diagnosis present

## 2019-11-25 DIAGNOSIS — K5900 Constipation, unspecified: Secondary | ICD-10-CM | POA: Diagnosis present

## 2019-11-25 DIAGNOSIS — Z6823 Body mass index (BMI) 23.0-23.9, adult: Secondary | ICD-10-CM

## 2019-11-25 DIAGNOSIS — Z09 Encounter for follow-up examination after completed treatment for conditions other than malignant neoplasm: Secondary | ICD-10-CM

## 2019-11-25 DIAGNOSIS — F17200 Nicotine dependence, unspecified, uncomplicated: Secondary | ICD-10-CM

## 2019-11-25 DIAGNOSIS — S91301A Unspecified open wound, right foot, initial encounter: Secondary | ICD-10-CM

## 2019-11-25 DIAGNOSIS — D649 Anemia, unspecified: Secondary | ICD-10-CM | POA: Diagnosis not present

## 2019-11-25 DIAGNOSIS — F10231 Alcohol dependence with withdrawal delirium: Secondary | ICD-10-CM | POA: Diagnosis not present

## 2019-11-25 DIAGNOSIS — Z7902 Long term (current) use of antithrombotics/antiplatelets: Secondary | ICD-10-CM

## 2019-11-25 DIAGNOSIS — L97519 Non-pressure chronic ulcer of other part of right foot with unspecified severity: Secondary | ICD-10-CM | POA: Diagnosis present

## 2019-11-25 DIAGNOSIS — Z01818 Encounter for other preprocedural examination: Secondary | ICD-10-CM

## 2019-11-25 DIAGNOSIS — I10 Essential (primary) hypertension: Secondary | ICD-10-CM | POA: Diagnosis present

## 2019-11-25 DIAGNOSIS — I745 Embolism and thrombosis of iliac artery: Secondary | ICD-10-CM | POA: Diagnosis present

## 2019-11-25 DIAGNOSIS — Y838 Other surgical procedures as the cause of abnormal reaction of the patient, or of later complication, without mention of misadventure at the time of the procedure: Secondary | ICD-10-CM | POA: Diagnosis present

## 2019-11-25 DIAGNOSIS — Z806 Family history of leukemia: Secondary | ICD-10-CM

## 2019-11-25 DIAGNOSIS — Z8249 Family history of ischemic heart disease and other diseases of the circulatory system: Secondary | ICD-10-CM

## 2019-11-25 DIAGNOSIS — E1142 Type 2 diabetes mellitus with diabetic polyneuropathy: Secondary | ICD-10-CM | POA: Diagnosis present

## 2019-11-25 DIAGNOSIS — K219 Gastro-esophageal reflux disease without esophagitis: Secondary | ICD-10-CM | POA: Diagnosis present

## 2019-11-25 DIAGNOSIS — L03115 Cellulitis of right lower limb: Secondary | ICD-10-CM | POA: Diagnosis present

## 2019-11-25 DIAGNOSIS — Z20822 Contact with and (suspected) exposure to covid-19: Secondary | ICD-10-CM | POA: Diagnosis present

## 2019-11-25 DIAGNOSIS — I70223 Atherosclerosis of native arteries of extremities with rest pain, bilateral legs: Principal | ICD-10-CM | POA: Diagnosis present

## 2019-11-25 LAB — BASIC METABOLIC PANEL
Anion gap: 9 (ref 5–15)
BUN: 6 mg/dL (ref 6–20)
CO2: 25 mmol/L (ref 22–32)
Calcium: 9.1 mg/dL (ref 8.9–10.3)
Chloride: 101 mmol/L (ref 98–111)
Creatinine, Ser: 0.76 mg/dL (ref 0.61–1.24)
GFR calc Af Amer: >60 mL/min (ref >60)
GFR calc non Af Amer: >60 mL/min (ref >60)
Glucose, Bld: 109 mg/dL — ABNORMAL HIGH (ref 70–99)
Potassium: 4.1 mmol/L (ref 3.5–5.1)
Sodium: 135 mmol/L (ref 135–145)

## 2019-11-25 LAB — CBC
HCT: 39.9 % (ref 39.0–52.0)
Hemoglobin: 13.3 g/dL (ref 13.0–17.0)
MCH: 30.4 pg (ref 26.0–34.0)
MCHC: 33.3 g/dL (ref 30.0–36.0)
MCV: 91.3 fL (ref 80.0–100.0)
Platelets: 434 10*3/uL — ABNORMAL HIGH (ref 150–400)
RBC: 4.37 MIL/uL (ref 4.22–5.81)
RDW: 13.3 % (ref 11.5–15.5)
WBC: 11.1 10*3/uL — ABNORMAL HIGH (ref 4.0–10.5)
nRBC: 0 % (ref 0.0–0.2)

## 2019-11-25 LAB — PROTIME-INR
INR: 0.9 (ref 0.8–1.2)
Prothrombin Time: 12.5 seconds (ref 11.4–15.2)

## 2019-11-25 LAB — MAGNESIUM: Magnesium: 1.9 mg/dL (ref 1.7–2.4)

## 2019-11-25 LAB — APTT: aPTT: 36 seconds (ref 24–36)

## 2019-11-25 LAB — HEPARIN LEVEL (UNFRACTIONATED): Heparin Unfractionated: 0.12 IU/mL — ABNORMAL LOW (ref 0.30–0.70)

## 2019-11-25 MED ORDER — MUPIROCIN 2 % EX OINT
1.0000 "application " | TOPICAL_OINTMENT | Freq: Two times a day (BID) | CUTANEOUS | Status: DC
  Filled 2019-11-25: qty 22

## 2019-11-25 MED ORDER — ONDANSETRON HCL 4 MG PO TABS: 4.0000 mg | ORAL_TABLET | Freq: Four times a day (QID) | ORAL | Status: DC | PRN

## 2019-11-25 MED ORDER — HEPARIN SODIUM (PORCINE) 5000 UNIT/ML IJ SOLN: 5000.0000 [IU] | Freq: Three times a day (TID) | INTRAMUSCULAR | Status: DC

## 2019-11-25 MED ORDER — HEPARIN BOLUS VIA INFUSION
4000.0000 [IU] | Freq: Once | INTRAVENOUS | Status: AC
  Administered 2019-11-25: 4000 [IU] via INTRAVENOUS
  Filled 2019-11-25: qty 4000

## 2019-11-25 MED ORDER — OXYCODONE HCL 5 MG PO TABS
5.0000 mg | ORAL_TABLET | ORAL | Status: DC | PRN
  Administered 2019-11-25 – 2019-12-03 (×5): 5 mg via ORAL
  Filled 2019-11-25 (×6): qty 1

## 2019-11-25 MED ORDER — ACETAMINOPHEN 325 MG PO TABS
650.0000 mg | ORAL_TABLET | Freq: Four times a day (QID) | ORAL | Status: DC | PRN
  Administered 2019-11-29 – 2019-11-30 (×2): 650 mg via ORAL
  Filled 2019-11-25 (×2): qty 2

## 2019-11-25 MED ORDER — HEPARIN (PORCINE) 25000 UT/250ML-% IV SOLN
1600.0000 [IU]/h | INTRAVENOUS | Status: DC
  Administered 2019-11-25: 16:00:00 1000 [IU]/h via INTRAVENOUS
  Administered 2019-11-26: 23:00:00 1600 [IU]/h via INTRAVENOUS
  Administered 2019-11-26: 08:00:00 1400 [IU]/h via INTRAVENOUS
  Administered 2019-11-27: 16:00:00 1600 [IU]/h via INTRAVENOUS
  Filled 2019-11-25 (×4): qty 250

## 2019-11-25 MED ORDER — MORPHINE SULFATE (PF) 2 MG/ML IV SOLN: 2.0000 mg | INTRAVENOUS | Status: DC | PRN

## 2019-11-25 MED ORDER — ATORVASTATIN CALCIUM 10 MG PO TABS
10.0000 mg | ORAL_TABLET | Freq: Every day | ORAL | Status: DC
  Administered 2019-11-26: 10 mg via ORAL
  Filled 2019-11-25 (×2): qty 1

## 2019-11-25 MED ORDER — ONDANSETRON HCL 4 MG/2ML IJ SOLN: 4.0000 mg | Freq: Four times a day (QID) | INTRAMUSCULAR | Status: DC | PRN

## 2019-11-25 MED ORDER — HEPARIN BOLUS VIA INFUSION
2300.0000 [IU] | Freq: Once | INTRAVENOUS | Status: AC
  Administered 2019-11-25: 23:00:00 2300 [IU] via INTRAVENOUS
  Filled 2019-11-25: qty 2300

## 2019-11-25 MED ORDER — ACETAMINOPHEN 650 MG RE SUPP: 650.0000 mg | Freq: Four times a day (QID) | RECTAL | Status: DC | PRN

## 2019-11-25 NOTE — Assessment & Plan Note (Signed)
Understands that this is a primary cause of his disease and will attempt smoking cessation forward

## 2019-11-25 NOTE — Assessment & Plan Note (Signed)
The patient still has disabling claudication symptoms on the left, although they are slightly improved after intervention.  On the right, he clearly has a critical and limb threatening situation with rest pain.  We had discussed femoral endarterectomies with infrainguinal intervention at the time of his aortoiliac intervention previously.  He clearly needs this.  Both sides are severely diseased and we will proceed with bilateral femoral endarterectomies and infrainguinal intervention.  I will also reassess his iliac interventions to reassess the patency.  I think he should be admitted to the hospital we will do so today.  We will start him on a heparin drip and give him IV pain medications as needed.  His surgery will be later this week.  We will try to obtain cardiac and preoperative testing.

## 2019-11-25 NOTE — Consult Note (Signed)
ANTICOAGULATION CONSULT NOTE - Initial Consult  Pharmacy Consult for Heparin Drip Indication:  Atherosclerotic Disease With Rest Pain  No Known Allergies  Patient Measurements:   Heparin Dosing Weight: 77.6kg  Vital Signs: Temp: 98.9 F (37.2 C) (02/09 1405) Temp Source: Oral (02/09 1405) BP: 102/72 (02/09 1405) Pulse Rate: 73 (02/09 1405)  Labs: No results for input(s): HGB, HCT, PLT, APTT, LABPROT, INR, HEPARINUNFRC, HEPRLOWMOCWT, CREATININE, CKTOTAL, CKMB, TROPONINIHS in the last 72 hours.  Estimated Creatinine Clearance: 109.1 mL/min (by C-G formula based on SCr of 0.78 mg/dL).   Medical History: Past Medical History:  Diagnosis Date  . Peripheral arterial disease (HCC)     Medications:  No PTA anticoagulant of record  Assessment: Brendan Williams is a 59 y.o. male.  Patient returns in follow-up of his severe peripheral arterial disease.  He underwent aortoiliac intervention about a week and a half ago, but continues to have persistent disabling rest pain of the right foot.  Pharmacy has been consulted to initiate and monitor a heparin drip.  Goal of Therapy:  Heparin level 0.3-0.7 units/ml Monitor platelets by anticoagulation protocol: Yes   Plan:  Give 4000 units bolus x 1 Start heparin infusion at 1000 units/hr Check anti-Xa level in 6 hours and daily while on heparin Continue to monitor H&H and platelets  Albina Billet, PharmD, BCPS Clinical Pharmacist 11/25/2019 2:22 PM

## 2019-11-25 NOTE — Progress Notes (Signed)
Patient ID: Brendan Williams, male   DOB: 1961/03/02, 59 y.o.   MRN: [id number]  Chief Complaint  Patient presents with  . Follow-up    1 week [facility]    HPI Brendan Williams is a 59 y.o. male.  Patient returns in follow-up of his severe peripheral arterial disease.  He underwent aortoiliac intervention about a week and a half ago, but continues to have persistent disabling rest pain of the right foot and has bluish discoloration of the right fifth toe.  He had known common femoral and SFA occlusion as well as profunda disease on the right below the aortoiliac intervention for occluded iliac arteries.  He has gotten some improvement on the left leg where he had SFA disease and some fairly significant common femoral disease spilling into the origins of the profunda and SFA but the flow did appear improved on the left.  He has had sleepless nights and basically cannot rest due to the pain in the right foot that is unrelenting   PMH Tobacco abuse PAD GERD  Past Surgical History:  Procedure Laterality Date  . BACK SURGERY     lower ruptured disk  . LOWER EXTREMITY ANGIOGRAPHY Right 11/14/2019   Procedure: LOWER EXTREMITY ANGIOGRAPHY;  Surgeon: Annice Needy, MD;  Location: [facility] INVASIVE CV LAB;  Service: Cardiovascular;  Laterality: Right;  . STOMACH SURGERY    . TONSILECTOMY, ADENOIDECTOMY, BILATERAL MYRINGOTOMY AND TUBES       Family History  Problem Relation Age of Onset  . Leukemia Mother   . Heart disease Father   . Heart disease Sister   . Cancer Sister   . Cancer Brother     Social History Social History   Tobacco Use  . Smoking status: Current Every Day Smoker    Packs/day: 1.00    Types: Cigarettes  . Smokeless tobacco: Never Used  Substance Use Topics  . Alcohol use: Yes    Alcohol/week: 5.0 standard drinks    Types: 5 Cans of beer per week    Comment: 4-5 beers a week  . Drug use: Yes    Frequency: 3.0 times per week    Types: Marijuana    Comment: 3-4 times  a week     No Known Allergies  Current Outpatient Medications  Medication Sig Dispense Refill  . aspirin EC 81 MG tablet Take 1 tablet (81 mg total) by mouth daily. 150 tablet 2  . atorvastatin (LIPITOR) 10 MG tablet Take 1 tablet (10 mg total) by mouth daily. 30 tablet 11  . clopidogrel (PLAVIX) 75 MG tablet Take 1 tablet (75 mg total) by mouth daily. 30 tablet 11   No current facility-administered medications for this visit.      REVIEW OF SYSTEMS (Negative unless checked)  Constitutional: [] Weight loss  [] Fever  [] Chills Cardiac: [] Chest pain   [] Chest pressure   [] Palpitations   [] Shortness of breath when laying flat   [] Shortness of breath at rest   [] Shortness of breath with exertion. Vascular:  [] Pain in legs with walking   [] Pain in legs at rest   [] Pain in legs when laying flat   [x] Claudication   [x] Pain in feet when walking  [x] Pain in feet at rest  [] Pain in feet when laying flat   [] History of DVT   [] Phlebitis   [] Swelling in legs   [] Varicose veins   [] Non-healing ulcers Pulmonary:   [] Uses home oxygen   [] Productive cough   [] Hemoptysis   [] Wheeze  []   COPD   [] Asthma Neurologic:  [] Dizziness  [] Blackouts   [] Seizures   [] History of stroke   [] History of TIA  [] Aphasia   [] Temporary blindness   [] Dysphagia   [] Weakness or numbness in arms   [] Weakness or numbness in legs Musculoskeletal:  [x] Arthritis   [] Joint swelling   [] Joint pain   [] Low back pain Hematologic:  [] Easy bruising  [] Easy bleeding   [] Hypercoagulable state   [] Anemic  [] Hepatitis Gastrointestinal:  [] Blood in stool   [] Vomiting blood  [x] Gastroesophageal reflux/heartburn   [] Abdominal pain Genitourinary:  [] Chronic kidney disease   [] Difficult urination  [] Frequent urination  [] Burning with urination   [] Hematuria Skin:  [] Rashes   [] Ulcers   [] Wounds Psychological:  [] History of anxiety   []  History of major depression.    Physical Exam BP (!) 147/87 (BP Location: Left Arm)   Pulse 68   Resp 14    Wt 171 lb (77.6 kg)   BMI 23.19 kg/m  Gen:  WD/WN, NAD Head: [place]/AT, No temporalis wasting.  Ear/Nose/Throat: Hearing grossly intact, nares w/o erythema or drainage, oropharynx w/o Erythema/Exudate Eyes: Conjunctiva clear, sclera non-icteric  Neck: trachea midline.  No JVD.  Pulmonary:  Good air movement, respirations not labored, no use of accessory muscles  Cardiac: RRR, no JVD Vascular:  Vessel Right Left  Radial Palpable Palpable                          PT  not palpable  trace palpable  DP  not palpable  not palpable   Gastrointestinal:. No masses, surgical incisions, or scars. Musculoskeletal: M/S 5/5 throughout.  No deformity or atrophy.  Right foot with rubor on dependency and sluggish capillary refill.  Right fifth toe is cyanotic.  2+ right lower extremity edema, trace left lower extremity edema. Neurologic: Sensation grossly intact in extremities.  Symmetrical.  Speech is fluent. Motor exam as listed above. Psychiatric: Judgment intact, Mood & affect appropriate for pt's clinical situation. Dermatologic: No rashes or ulcers noted.  No cellulitis or open wounds.    Radiology PERIPHERAL VASCULAR CATHETERIZATION  Result Date: 11/14/2019 See op note  VAS ABI WITH/WO TBI  Result Date: 11/14/2019 LOWER EXTREMITY DOPPLER STUDY Indications: Claudication.  Performing Technologist: RT, RDMS, RVT  Examination Guidelines: A complete evaluation includes at minimum, Doppler waveform signals and systolic blood pressure reading at the level of bilateral brachial, anterior tibial, and posterior tibial arteries, when vessel segments are accessible. Bilateral testing is considered an integral part of a complete examination. Photoelectric Plethysmograph (PPG) waveforms and toe systolic pressure readings are included as required and additional duplex testing as needed. Limited examinations for reoccurring indications may be performed as noted.  ABI Findings:  +---------+------------------+-----+-------------------+-----------------------+ Right    Rt Pressure (mmHg)IndexWaveform           Comment                 +---------+------------------+-----+-------------------+-----------------------+ Brachial 94                                        >84mm/Mg less than left +---------+------------------+-----+-------------------+-----------------------+ CFA                             dampened monophasic                        +---------+------------------+-----+-------------------+-----------------------+  Popliteal                       dampened monophasic                        +---------+------------------+-----+-------------------+-----------------------+ ATA      76                0.60 dampened monophasic                        +---------+------------------+-----+-------------------+-----------------------+ PTA      69                0.55 dampened monophasic                        +---------+------------------+-----+-------------------+-----------------------+ Great Toe48                0.38 Abnormal                                   +---------+------------------+-----+-------------------+-----------------------+ +---------+------------------+-----+----------+-------+ Left     Lt Pressure (mmHg)IndexWaveform  Comment +---------+------------------+-----+----------+-------+ Brachial 126                                      +---------+------------------+-----+----------+-------+ CFA                             monophasic        +---------+------------------+-----+----------+-------+ Popliteal                       monophasic        +---------+------------------+-----+----------+-------+ ATA      94                0.75 monophasic        +---------+------------------+-----+----------+-------+ PTA      90                0.71 monophasic        +---------+------------------+-----+----------+-------+ Great  Toe66                0.52 Abnormal          +---------+------------------+-----+----------+-------+ Limited duplex: RIGHT: Dampened monophasic flow in the common femoral, superficial femoral and popliteal arteries with monophasic flow reversal in the profunda femoral artery via collateralization. No flow detected in external iliac artery region. LEFT: Monophasic flow in the left common femoral, profunda femoral, superficial femoral, and popliteal arteries. No flow was visualized in the external iliac artery region with prominent superficial collateral noted.  Summary: Right: Resting right ankle-brachial index indicates moderate right lower extremity arterial disease. The right toe-brachial index is abnormal. Left: Resting left ankle-brachial index indicates moderate left lower extremity arterial disease. The left toe-brachial index is abnormal. Limited duplex is suggestive of bilateral iliac level arterial occlusions, as described above.  *See table(s) above for measurements and observations.  Electronically signed by Leotis Pain MD on 11/14/2019 at 8:44:05 AM.    Final     Labs Recent Results (from the past 2160 hour(s))  SARS CORONAVIRUS 2 (TAT 6-24 HRS) Nasopharyngeal Nasopharyngeal Swab     Status: None   Collection Time: 11/13/19  2:18 PM   Specimen: Nasopharyngeal Swab  Result Value  Ref Range   SARS Coronavirus 2 NEGATIVE NEGATIVE    Comment: (NOTE) SARS-CoV-2 target nucleic acids are NOT DETECTED. The SARS-CoV-2 RNA is generally detectable in upper and lower respiratory specimens during the acute phase of infection. Negative results do not preclude SARS-CoV-2 infection, do not rule out co-infections with other pathogens, and should not be used as the sole basis for treatment or other patient management decisions. Negative results must be combined with clinical observations, patient history, and epidemiological information. The expected result is Negative. Fact Sheet for  Patients: HairSlick.no Fact Sheet for Healthcare Providers: quierodirigir.com This test is not yet approved or cleared by the [place] FDA and  has been authorized for detection and/or diagnosis of SARS-CoV-2 by FDA under an Emergency Use Authorization (EUA). This EUA will remain  in effect (meaning this test can be used) for the duration of the COVID-19 declaration under Section 56 4(b)(1) of the Act, 21 U.S.C. section 360bbb-3(b)(1), unless the authorization is terminated or revoked sooner. Performed at [facility], [address], [place], [place] [address]   BUN     Status: None   Collection Time: 11/14/19  1:13 PM  Result Value Ref Range   BUN 7 6 - 20 mg/dL    Comment: Performed at [facility], [address] [place], [place] [address]  Creatinine, serum     Status: None   Collection Time: 11/14/19  1:13 PM  Result Value Ref Range   Creatinine, Ser 0.78 0.61 - 1.24 mg/dL   GFR calc non Af Amer >60 >60 mL/min   GFR calc Af Amer >60 >60 mL/min    Comment: Performed at [facility], [address] [place], [place] [address]    Assessment/Plan:  Tobacco use disorder Understands that this is a primary cause of his disease and will attempt smoking cessation forward  Atherosclerosis of native arteries of extremity with rest pain [facility]) The patient still has disabling claudication symptoms on the left, although they are slightly improved after intervention.  On the right, he clearly has a critical and limb threatening situation with rest pain.  We had discussed femoral endarterectomies with infrainguinal intervention at the time of his aortoiliac intervention previously.  He clearly needs this.  Both sides are severely diseased and we will proceed with bilateral femoral endarterectomies and infrainguinal intervention.  I will also reassess his iliac interventions to reassess the patency.  I think he  should be admitted to the hospital we will do so today.  We will start him on a heparin drip and give him IV pain medications as needed.  His surgery will be later this week.  We will try to obtain cardiac and preoperative testing.      Festus Barren 11/25/2019, 1:30 PM   This note was created with Dragon medical transcription system.  Any errors from dictation are unintentional.

## 2019-11-25 NOTE — Consult Note (Signed)
ANTICOAGULATION CONSULT NOTE - Initial Consult  Pharmacy Consult for Heparin Drip Indication:  Atherosclerotic Disease With Rest Pain  No Known Allergies  Patient Measurements: Height: 6' (182.9 cm) IBW/kg (Calculated) : 77.6 Heparin Dosing Weight: 77.6kg  Vital Signs: Temp: 98.7 F (37.1 C) (02/09 2037) Temp Source: Oral (02/09 2037) BP: 146/90 (02/09 2037) Pulse Rate: 73 (02/09 2037)  Labs: Recent Labs    11/25/19 1418 11/25/19 2118  HGB 13.3  --   HCT 39.9  --   PLT 434*  --   APTT 36  --   LABPROT 12.5  --   INR 0.9  --   HEPARINUNFRC  --  0.12*  CREATININE 0.76  --     Estimated Creatinine Clearance: 109.1 mL/min (by C-G formula based on SCr of 0.76 mg/dL).   Medical History: Past Medical History:  Diagnosis Date  . Peripheral arterial disease (HCC)     Medications:  No PTA anticoagulant of record  Assessment: Brendan Williams is a 59 y.o. male.  Patient returns in follow-up of his severe peripheral arterial disease.  He underwent aortoiliac intervention about a week and a half ago, but continues to have persistent disabling rest pain of the right foot.  Pharmacy has been consulted to initiate and monitor a heparin drip.  Goal of Therapy:  Heparin level 0.3-0.7 units/ml Monitor platelets by anticoagulation protocol: Yes   Plan:  Give 4000 units bolus x 1 Start heparin infusion at 1000 units/hr Check anti-Xa level in 6 hours and daily while on heparin Continue to monitor H&H and platelets  2/9:  HL @ 2118 = 0.12 Will order Heparin 2300 units IV X 1 bolus and increase drip rate to 1300 units/hr. Will recheck HL 6 hrs after rate change.   Scherrie Gerlach, PharmD Clinical Pharmacist 11/25/2019 9:55 PM

## 2019-11-25 NOTE — Patient Instructions (Signed)
Peripheral Vascular Disease  Peripheral vascular disease (PVD) is a disease of the blood vessels that are not part of your heart and brain. A simple term for PVD is poor circulation. In most cases, PVD narrows the blood vessels that carry blood from your heart to the rest of your body. This can reduce the supply of blood to your arms, legs, and internal organs, like your stomach or kidneys. However, PVD most often affects a person's lower legs and feet. Without treatment, PVD tends to get worse. PVD can also lead to acute ischemic limb. This is when an arm or leg suddenly cannot get enough blood. This is a medical emergency. Follow these instructions at home: Lifestyle  Do not use any products that contain nicotine or tobacco, such as cigarettes and e-cigarettes. If you need help quitting, ask your doctor.  Lose weight if you are overweight. Or, stay at a healthy weight as told by your doctor.  Eat a diet that is low in fat and cholesterol. If you need help, ask your doctor.  Exercise regularly. Ask your doctor for activities that are right for you. General instructions  Take over-the-counter and prescription medicines only as told by your doctor.  Take good care of your feet: ? Wear comfortable shoes that fit well. ? Check your feet often for any cuts or sores.  Keep all follow-up visits as told by your doctor This is important. Contact a doctor if:  You have cramps in your legs when you walk.  You have leg pain when you are at rest.  You have coldness in a leg or foot.  Your skin changes.  You are unable to get or have an erection (erectile dysfunction).  You have cuts or sores on your feet that do not heal. Get help right away if:  Your arm or leg turns cold, numb, and blue.  Your arms or legs become red, warm, swollen, painful, or numb.  You have chest pain.  You have trouble breathing.  You suddenly have weakness in your face, arm, or leg.  You become very  confused or you cannot speak.  You suddenly have a very bad headache.  You suddenly cannot see. Summary  Peripheral vascular disease (PVD) is a disease of the blood vessels.  A simple term for PVD is poor circulation. Without treatment, PVD tends to get worse.  Treatment may include exercise, low fat and low cholesterol diet, and quitting smoking. This information is not intended to replace advice given to you by your health care provider. Make sure you discuss any questions you have with your health care provider. Document Revised: 09/14/2017 Document Reviewed: 11/09/2016 Elsevier Patient Education  2020 [employer]  

## 2019-11-26 ENCOUNTER — Inpatient Hospital Stay: Payer: Self-pay

## 2019-11-26 ENCOUNTER — Encounter: Payer: Self-pay | Admitting: Vascular Surgery

## 2019-11-26 DIAGNOSIS — I70219 Atherosclerosis of native arteries of extremities with intermittent claudication, unspecified extremity: Secondary | ICD-10-CM

## 2019-11-26 DIAGNOSIS — F17200 Nicotine dependence, unspecified, uncomplicated: Secondary | ICD-10-CM

## 2019-11-26 DIAGNOSIS — I70229 Atherosclerosis of native arteries of extremities with rest pain, unspecified extremity: Secondary | ICD-10-CM

## 2019-11-26 LAB — MAGNESIUM: Magnesium: 2.1 mg/dL (ref 1.7–2.4)

## 2019-11-26 LAB — BASIC METABOLIC PANEL
Anion gap: 9 (ref 5–15)
BUN: 7 mg/dL (ref 6–20)
CO2: 25 mmol/L (ref 22–32)
Calcium: 8.9 mg/dL (ref 8.9–10.3)
Chloride: 104 mmol/L (ref 98–111)
Creatinine, Ser: 0.7 mg/dL (ref 0.61–1.24)
GFR calc Af Amer: >60 mL/min (ref >60)
GFR calc non Af Amer: >60 mL/min (ref >60)
Glucose, Bld: 108 mg/dL — ABNORMAL HIGH (ref 70–99)
Potassium: 4 mmol/L (ref 3.5–5.1)
Sodium: 138 mmol/L (ref 135–145)

## 2019-11-26 LAB — HEPARIN LEVEL (UNFRACTIONATED)
Heparin Unfractionated: 0.15 IU/mL — ABNORMAL LOW (ref 0.30–0.70)
Heparin Unfractionated: 0.15 IU/mL — ABNORMAL LOW (ref 0.30–0.70)
Heparin Unfractionated: 0.46 IU/mL (ref 0.30–0.70)

## 2019-11-26 LAB — CBC
HCT: 39.8 % (ref 39.0–52.0)
Hemoglobin: 13.3 g/dL (ref 13.0–17.0)
MCH: 30.3 pg (ref 26.0–34.0)
MCHC: 33.4 g/dL (ref 30.0–36.0)
MCV: 90.7 fL (ref 80.0–100.0)
Platelets: 430 10*3/uL — ABNORMAL HIGH (ref 150–400)
RBC: 4.39 MIL/uL (ref 4.22–5.81)
RDW: 13.2 % (ref 11.5–15.5)
WBC: 9.3 10*3/uL (ref 4.0–10.5)
nRBC: 0 % (ref 0.0–0.2)

## 2019-11-26 LAB — SURGICAL PCR SCREEN
MRSA, PCR: NEGATIVE
Staphylococcus aureus: NEGATIVE

## 2019-11-26 MED ORDER — MORPHINE SULFATE (PF) 2 MG/ML IV SOLN
2.0000 mg | INTRAVENOUS | Status: DC | PRN
  Administered 2019-11-27 – 2019-12-03 (×17): 2 mg via INTRAVENOUS
  Filled 2019-11-26 (×18): qty 1

## 2019-11-26 MED ORDER — OXYCODONE HCL 5 MG PO TABS
10.0000 mg | ORAL_TABLET | ORAL | Status: DC | PRN
  Administered 2019-11-26 – 2019-12-03 (×18): 10 mg via ORAL
  Filled 2019-11-26 (×19): qty 2

## 2019-11-26 MED ORDER — HEPARIN BOLUS VIA INFUSION
2500.0000 [IU] | Freq: Once | INTRAVENOUS | Status: AC
  Administered 2019-11-26: 2500 [IU] via INTRAVENOUS
  Filled 2019-11-26: qty 2500

## 2019-11-26 MED ORDER — NICOTINE 21 MG/24HR TD PT24
21.0000 mg | MEDICATED_PATCH | Freq: Every day | TRANSDERMAL | Status: DC
  Administered 2019-11-29 – 2019-11-30 (×2): 21 mg via TRANSDERMAL
  Filled 2019-11-26 (×7): qty 1

## 2019-11-26 NOTE — Consult Note (Signed)
ANTICOAGULATION CONSULT NOTE - Initial Consult  Pharmacy Consult for Heparin Drip Indication:  Atherosclerotic Disease With Rest Pain  No Known Allergies  Patient Measurements: Height: 6' (182.9 cm) Weight: 170 lb 13.7 oz (77.5 kg) IBW/kg (Calculated) : 77.6 Heparin Dosing Weight: 77.6kg  Vital Signs: Temp: 98.6 F (37 C) (02/10 1932) Temp Source: Oral (02/10 1932) BP: 125/73 (02/10 1932) Pulse Rate: 65 (02/10 1932)  Labs: Recent Labs    11/25/19 1418 11/25/19 2118 11/26/19 0504 11/26/19 1317 11/26/19 2213  HGB 13.3  --  13.3  --   --   HCT 39.9  --  39.8  --   --   PLT 434*  --  430*  --   --   APTT 36  --   --   --   --   LABPROT 12.5  --   --   --   --   INR 0.9  --   --   --   --   HEPARINUNFRC  --    < > 0.15* 0.15* 0.46  CREATININE 0.76  --  0.70  --   --    < > = values in this interval not displayed.    Estimated Creatinine Clearance: 109 mL/min (by C-G formula based on SCr of 0.7 mg/dL).   Medical History: Past Medical History:  Diagnosis Date  . Peripheral arterial disease (HCC)     Medications:  No PTA anticoagulant of record  Assessment: AMERE BRICCO is a 58 y.o. male.  Patient returns in follow-up of his severe peripheral arterial disease.  He underwent aortoiliac intervention about a week and a half ago, but continues to have persistent disabling rest pain of the right foot.  Pharmacy has been consulted to initiate and monitor a heparin drip.  Started with 4000 unit bolus and 1000units/hr  0209 2118 HL 0.12 (2300 unit bolus and rate incr to 1300 units) 0210 0504 HL 0.15 (no bolus and rate incr to 1400 units/hr) 0210 1317 HL 0.15  Goal of Therapy:  Heparin level 0.3-0.7 units/ml Monitor platelets by anticoagulation protocol: Yes   Plan:  02/10 @ 2200 HL 0.46 therapeutic. Will continue current rate and will recheck HL w/ am labs.  Thomasene Ripple, PharmD Clinical Pharmacist 11/26/2019 11:31 PM

## 2019-11-26 NOTE — Progress Notes (Addendum)
 Vein & Vascular Surgery Daily Progress Note   Subjective: Patient without complaint this AM.  Minimal improvement to lower extremity discomfort with initiation of heparin.  Objective: Vitals:   11/25/19 1405 11/25/19 2037 11/25/19 2048 11/26/19 0521  BP: 102/72 (!) 146/90  (!) 141/87  Pulse: 73 73  67  Resp: 20 18  20   Temp: 98.9 F (37.2 C) 98.7 F (37.1 C)  97.7 F (36.5 C)  TempSrc: Oral Oral  Oral  SpO2: 100% 99%  99%  Height:   6' (1.829 m)     Intake/Output Summary (Last 24 hours) at 11/26/2019 1148 Last data filed at 11/26/2019 0949 Gross per 24 hour  Intake 499.42 ml  Output --  Net 499.42 ml   Physical Exam: A&Ox3, NAD CV: RRR Pulmonary: CTA Bilaterally Abdomen: Soft, Nontender, Nondistended Vascular:  Right lower extremity: Thigh soft.  Calf soft.  Extremities warm distally in toes.  Unable to palpate pedal pulses.  Bluish tint to toes.  Left lower extremity: Thigh soft.  Calf soft.  Extremities warm distally in toes.  Unable to palpate pedal pulses.   Laboratory: CBC    Component Value Date/Time   WBC 9.3 11/26/2019 0504   HGB 13.3 11/26/2019 0504   HCT 39.8 11/26/2019 0504   PLT 430 (H) 11/26/2019 0504   BMET    Component Value Date/Time   NA 138 11/26/2019 0504   K 4.0 11/26/2019 0504   CL 104 11/26/2019 0504   CO2 25 11/26/2019 0504   GLUCOSE 108 (H) 11/26/2019 0504   BUN 7 11/26/2019 0504   CREATININE 0.70 11/26/2019 0504   CALCIUM 8.9 11/26/2019 0504   GFRNONAA >60 11/26/2019 0504   GFRAA >60 11/26/2019 0504   Assessment/Planning: The patient is a 59 year old male with known history of peripheral artery disease s/p endovascular intervention with minimal improvement in symptoms and physical exam.  1) Atherosclerotic Disease with Rest Pain: Symptoms are bilateral. Minimal improvement with heparin - however will continue for now.  Plan is for bilateral femoral endarterectomies with iliac stenting on Friday with Dr. Monday Have  placed consult to cardiology for clearance. Will order EKG / CXR for pre-op clearance.  We will continue to monitor.  Discussed with Dr. Wyn Quaker Loie Jahr PA-C 11/26/2019 11:48 AM

## 2019-11-26 NOTE — Consult Note (Signed)
ANTICOAGULATION CONSULT NOTE - Initial Consult  Pharmacy Consult for Heparin Drip Indication:  Atherosclerotic Disease With Rest Pain  No Known Allergies  Patient Measurements: Height: 6' (182.9 cm) IBW/kg (Calculated) : 77.6 Heparin Dosing Weight: 77.6kg  Vital Signs: Temp: 98.3 F (36.8 C) (02/10 1301) Temp Source: Oral (02/10 1301) BP: 129/63 (02/10 1301) Pulse Rate: 66 (02/10 1301)  Labs: Recent Labs    11/25/19 1418 11/25/19 2118 11/26/19 0504 11/26/19 1317  HGB 13.3  --  13.3  --   HCT 39.9  --  39.8  --   PLT 434*  --  430*  --   APTT 36  --   --   --   LABPROT 12.5  --   --   --   INR 0.9  --   --   --   HEPARINUNFRC  --  0.12* 0.15* 0.15*  CREATININE 0.76  --  0.70  --     Estimated Creatinine Clearance: 109.1 mL/min (by C-G formula based on SCr of 0.7 mg/dL).   Medical History: Past Medical History:  Diagnosis Date  . Peripheral arterial disease (HCC)     Medications:  No PTA anticoagulant of record  Assessment: Brendan Williams is a 59 y.o. male.  Patient returns in follow-up of his severe peripheral arterial disease.  He underwent aortoiliac intervention about a week and a half ago, but continues to have persistent disabling rest pain of the right foot.  Pharmacy has been consulted to initiate and monitor a heparin drip.  Started with 4000 unit bolus and 1000units/hr  0209 2118 HL 0.12 (2300 unit bolus and rate incr to 1300 units) 0210 0504 HL 0.15 (no bolus and rate incr to 1400 units/hr) 0210 1317 HL 0.15  Goal of Therapy:  Heparin level 0.3-0.7 units/ml Monitor platelets by anticoagulation protocol: Yes   Plan:  02/10 @ 1317 HL 0.15 subtherapeutic.   Will bolus 2500 units and increase rate to 1600 units/hr  Will recheck HL in 6 hours per protocol.  CBC stable will continue to monitor.  Albina Billet, PharmD Clinical Pharmacist 11/26/2019 2:05 PM

## 2019-11-26 NOTE — H&P (Signed)
See clinic note from 11/25/19

## 2019-11-26 NOTE — Consult Note (Signed)
ANTICOAGULATION CONSULT NOTE - Initial Consult  Pharmacy Consult for Heparin Drip Indication:  Atherosclerotic Disease With Rest Pain  No Known Allergies  Patient Measurements: Height: 6' (182.9 cm) IBW/kg (Calculated) : 77.6 Heparin Dosing Weight: 77.6kg  Vital Signs: Temp: 97.7 F (36.5 C) (02/10 0521) Temp Source: Oral (02/10 0521) BP: 141/87 (02/10 0521) Pulse Rate: 67 (02/10 0521)  Labs: Recent Labs    11/25/19 1418 11/25/19 2118 11/26/19 0504  HGB 13.3  --  13.3  HCT 39.9  --  39.8  PLT 434*  --  430*  APTT 36  --   --   LABPROT 12.5  --   --   INR 0.9  --   --   HEPARINUNFRC  --  0.12* 0.15*  CREATININE 0.76  --  0.70    Estimated Creatinine Clearance: 109.1 mL/min (by C-G formula based on SCr of 0.7 mg/dL).   Medical History: Past Medical History:  Diagnosis Date  . Peripheral arterial disease (HCC)     Medications:  No PTA anticoagulant of record  Assessment: Brendan Williams is a 58 y.o. male.  Patient returns in follow-up of his severe peripheral arterial disease.  He underwent aortoiliac intervention about a week and a half ago, but continues to have persistent disabling rest pain of the right foot.  Pharmacy has been consulted to initiate and monitor a heparin drip.  Goal of Therapy:  Heparin level 0.3-0.7 units/ml Monitor platelets by anticoagulation protocol: Yes   Plan:  02/10 @ 0500 HL 0.15 subtherapeutic. Per RN patient's elbow was kinked for sometime, so therefore will not bolus but will increase rate to 1400 units/hr and will recheck HL at 1230, CBC stable will continue to monitor.  Thomasene Ripple, PharmD Clinical Pharmacist 11/26/2019 6:33 AM

## 2019-11-27 ENCOUNTER — Encounter: Payer: Self-pay | Admitting: Vascular Surgery

## 2019-11-27 DIAGNOSIS — Z01818 Encounter for other preprocedural examination: Secondary | ICD-10-CM

## 2019-11-27 DIAGNOSIS — I739 Peripheral vascular disease, unspecified: Secondary | ICD-10-CM

## 2019-11-27 LAB — CBC
HCT: 39.7 % (ref 39.0–52.0)
Hemoglobin: 13.5 g/dL (ref 13.0–17.0)
MCH: 30.9 pg (ref 26.0–34.0)
MCHC: 34 g/dL (ref 30.0–36.0)
MCV: 90.8 fL (ref 80.0–100.0)
Platelets: 415 10*3/uL — ABNORMAL HIGH (ref 150–400)
RBC: 4.37 MIL/uL (ref 4.22–5.81)
RDW: 13 % (ref 11.5–15.5)
WBC: 10.1 10*3/uL (ref 4.0–10.5)
nRBC: 0 % (ref 0.0–0.2)

## 2019-11-27 LAB — BASIC METABOLIC PANEL
Anion gap: 6 (ref 5–15)
BUN: 5 mg/dL — ABNORMAL LOW (ref 6–20)
CO2: 29 mmol/L (ref 22–32)
Calcium: 9.2 mg/dL (ref 8.9–10.3)
Chloride: 102 mmol/L (ref 98–111)
Creatinine, Ser: 0.69 mg/dL (ref 0.61–1.24)
GFR calc Af Amer: >60 mL/min (ref >60)
GFR calc non Af Amer: >60 mL/min (ref >60)
Glucose, Bld: 118 mg/dL — ABNORMAL HIGH (ref 70–99)
Potassium: 4 mmol/L (ref 3.5–5.1)
Sodium: 137 mmol/L (ref 135–145)

## 2019-11-27 LAB — MAGNESIUM: Magnesium: 1.8 mg/dL (ref 1.7–2.4)

## 2019-11-27 LAB — HEPARIN LEVEL (UNFRACTIONATED): Heparin Unfractionated: 0.38 IU/mL (ref 0.30–0.70)

## 2019-11-27 LAB — ABO/RH: ABO/RH(D): B POS

## 2019-11-27 MED ORDER — ENSURE ENLIVE PO LIQD
237.0000 mL | Freq: Three times a day (TID) | ORAL | Status: DC
  Administered 2019-11-27 – 2019-12-04 (×13): 237 mL via ORAL

## 2019-11-27 MED ORDER — ADULT MULTIVITAMIN W/MINERALS CH
1.0000 | ORAL_TABLET | Freq: Every day | ORAL | Status: DC
  Administered 2019-11-29 – 2019-12-04 (×5): 1 via ORAL
  Filled 2019-11-27 (×5): qty 1

## 2019-11-27 MED ORDER — SODIUM CHLORIDE 0.9 % IV SOLN: INTRAVENOUS | Status: DC

## 2019-11-27 MED ORDER — ATORVASTATIN CALCIUM 10 MG PO TABS
10.0000 mg | ORAL_TABLET | Freq: Every day | ORAL | Status: DC
  Administered 2019-11-27 – 2019-12-03 (×6): 10 mg via ORAL
  Filled 2019-11-27 (×5): qty 1

## 2019-11-27 NOTE — Progress Notes (Signed)
Patient foot turning red running upward toward leg

## 2019-11-27 NOTE — Progress Notes (Signed)
[facility] Daily Progress Note   Subjective: Without complaint. No issues overnight.   Objective: Vitals:   11/26/19 1301 11/26/19 1932 11/27/19 0508 11/27/19 0558  BP: 129/63 125/73 103/84   Pulse: 66 65 69   Resp: 16 18 16    Temp: 98.3 F (36.8 C) 98.6 F (37 C) 98.1 F (36.7 C)   TempSrc: Oral Oral Oral   SpO2: 99% 100% 97%   Weight: 77.5 kg   75.3 kg  Height: 6' (1.829 m)   6' (1.829 m)    Intake/Output Summary (Last 24 hours) at 11/27/2019 1030 Last data filed at 11/27/2019 0504 Gross per 24 hour  Intake 614.52 ml  Output 1200 ml  Net -585.48 ml   Physical Exam: A&Ox3, NAD CV: RRR Pulmonary: CTA Bilaterally Abdomen: Soft, Nontender, Nondistended Vascular:             Right lower extremity: Thigh soft.  Calf soft.  Extremities warm distally in toes.  Unable to palpate pedal pulses.  Bluish tint to toes.             Left lower extremity: Thigh soft.  Calf soft.  Extremities warm distally in toes.  Unable to palpate pedal pulses.   Laboratory: CBC    Component Value Date/Time   WBC 10.1 11/27/2019 0411   HGB 13.5 11/27/2019 0411   HCT 39.7 11/27/2019 0411   PLT 415 (H) 11/27/2019 0411   BMET    Component Value Date/Time   NA 137 11/27/2019 0411   K 4.0 11/27/2019 0411   CL 102 11/27/2019 0411   CO2 29 11/27/2019 0411   GLUCOSE 118 (H) 11/27/2019 0411   BUN 5 (L) 11/27/2019 0411   CREATININE 0.69 11/27/2019 0411   CALCIUM 9.2 11/27/2019 0411   GFRNONAA >60 11/27/2019 0411   GFRAA >60 11/27/2019 0411   Assessment/Planning: The patient is a 59 year old male with known history of peripheral artery disease s/p endovascular intervention with minimal improvement in symptoms and physical exam.  1) Atherosclerotic Disease with Rest Pain: Plan for bilateral femoral endarterectomy with endovascular intervention tomorrow.  Appreciate consult from cardiology.  Continue heparin with stop time tomorrow at 5:00am tomorrow. Pre-op  complete. We will continue to monitor.  Discussed with Dr. 46 Jalexus Brett PA-C 11/27/2019 10:30 AM

## 2019-11-27 NOTE — Progress Notes (Signed)
OT Cancellation Note  Patient Details Name: Brendan Williams MRN: [id number] DOB: [birth year]   Cancelled Treatment:    Reason Eval/Treat Not Completed: Other (comment). Consult received, chart reviewed. Pt here for resting claudication pain, pending surgical intervention with vascular team on Friday. Will hold evaluation until post-op phase.  Richrd Prime, MPH, MS, OTR/L ascom [phone] 11/27/19, 12:42 PM

## 2019-11-27 NOTE — Consult Note (Signed)
Cardiology Consultation:   Patient ID: Brendan Williams; [id number]; May 30, 1961   Admit date: 11/25/2019 Date of Consult: 11/27/2019  Primary Care Provider: Gildardo Pounds, PA Primary Cardiologist: new to [facility] - consult by Mariah Milling   Patient Profile:   Brendan Williams is a 59 y.o. male with a hx of severe PAD as detailed below which is followed by vascular surgery, tobacco use, and GERD who is being seen today for preoperative cardiac risk stratification at the request of Ms. Stegmayer, PA-C.  History of Present Illness:   Mr. Grzelak has no previously known cardiac history. He has known severe PAD with known common femoral and SFA occlusion as well as profunda disease which is distal to the recent aortoiliac intervention in late 10/2019 as detailed in the vascular surgery note. In follow up on 2/9, he continued to note persistent disabling rest pain of the right foot as well as cyanotic discoloration of the right 5th toe. He did note some improvement in symptoms on the left leg where he had SFA disease and some significant common femoral disease that extended into the origin of the profunda and SFA. Given his critical limb threatening disease on the right, it was recommended he undergo bilateral femoral endarterectomies and infrainguinal intervention. This is scheduled for 2/12.  He reports never having to see a cardiologist previously. He denies any chest pain, SOB, palpitations, dizziness, presyncope, or syncope. He is able to achieve > 4 METs without cardiac limitation with his limiting factor being claudication. He reports his father and brother had CABG in their 81s. He has smoked 1 pack daily since age 10.  Preoperative 12-lead EKG showed NSR, 63 bpm, normal axis, no acute st/t changes. He continues to note right lower extremity rest pain. He is without cardiac complaints.   Past Medical History:  Diagnosis Date  . Peripheral arterial disease [facility])     Past Surgical History:   Procedure Laterality Date  . BACK SURGERY     lower ruptured disk  . LOWER EXTREMITY ANGIOGRAPHY Right 11/14/2019   Procedure: LOWER EXTREMITY ANGIOGRAPHY;  Surgeon: Annice Needy, MD;  Location: [facility] INVASIVE CV LAB;  Service: Cardiovascular;  Laterality: Right;  . STOMACH SURGERY    . TONSILECTOMY, ADENOIDECTOMY, BILATERAL MYRINGOTOMY AND TUBES       Home Meds: Prior to Admission medications   Medication Sig Start Date End Date Taking? Authorizing Provider  aspirin EC 81 MG tablet Take 1 tablet (81 mg total) by mouth daily. 11/14/19   Annice Needy, MD  atorvastatin (LIPITOR) 10 MG tablet Take 1 tablet (10 mg total) by mouth daily. 11/14/19 11/13/20  Annice Needy, MD  clopidogrel (PLAVIX) 75 MG tablet Take 1 tablet (75 mg total) by mouth daily. 11/14/19   Annice Needy, MD    Inpatient Medications: Scheduled Meds: . atorvastatin  10 mg Oral Daily  . nicotine  21 mg Transdermal Daily   Continuous Infusions: . heparin 1,600 Units/hr (11/27/19 0504)   PRN Meds: acetaminophen **OR** acetaminophen, morphine injection, ondansetron **OR** ondansetron (ZOFRAN) IV, oxyCODONE, oxyCODONE  Allergies:  No Known Allergies  Social History:   Social History   Socioeconomic History  . Marital status: Married    Spouse name: Not on file  . Number of children: Not on file  . Years of education: Not on file  . Highest education level: Not on file  Occupational History  . Not on file  Tobacco Use  . Smoking status: Current Every  Day Smoker    Packs/day: 1.00    Types: Cigarettes  . Smokeless tobacco: Never Used  Substance and Sexual Activity  . Alcohol use: Yes    Alcohol/week: 5.0 standard drinks    Types: 5 Cans of beer per week    Comment: 4-5 beers a week  . Drug use: Yes    Frequency: 3.0 times per week    Types: Marijuana    Comment: 3-4 times a week  . Sexual activity: Yes  Other Topics Concern  . Not on file  Social History Narrative   Live in private residence with wife    Social Determinants of Health   Financial Resource Strain:   . Difficulty of Paying Living Expenses: Not on file  Food Insecurity:   . Worried About Charity fundraiser in the Last Year: Not on file  . Ran Out of Food in the Last Year: Not on file  Transportation Needs:   . Lack of Transportation (Medical): Not on file  . Lack of Transportation (Non-Medical): Not on file  Physical Activity:   . Days of Exercise per Week: Not on file  . Minutes of Exercise per Session: Not on file  Stress:   . Feeling of Stress : Not on file  Social Connections:   . Frequency of Communication with Friends and Family: Not on file  . Frequency of Social Gatherings with Friends and Family: Not on file  . Attends Religious Services: Not on file  . Active Member of Clubs or Organizations: Not on file  . Attends Archivist Meetings: Not on file  . Marital Status: Not on file  Intimate Partner Violence:   . Fear of Current or Ex-Partner: Not on file  . Emotionally Abused: Not on file  . Physically Abused: Not on file  . Sexually Abused: Not on file     Family History:   Family History  Problem Relation Age of Onset  . Leukemia Mother   . Heart disease Father        a. CABG  . Heart disease Sister        a. MVR  . Cancer Sister   . Cancer Brother   . CAD Brother        a. CABG    ROS:  Review of Systems  Constitutional: Positive for malaise/fatigue. Negative for chills, diaphoresis, fever and weight loss.  HENT: Negative for congestion.   Eyes: Negative for discharge and redness.  Respiratory: Negative for cough, sputum production, shortness of breath and wheezing.   Cardiovascular: Positive for claudication. Negative for chest pain, palpitations, orthopnea, leg swelling and PND.  Gastrointestinal: Negative for abdominal pain, heartburn, nausea and vomiting.  Musculoskeletal: Negative for falls and myalgias.  Skin: Negative for rash.  Neurological: Negative for dizziness,  tingling, tremors, sensory change, speech change, focal weakness, loss of consciousness and weakness.  Endo/Heme/Allergies: Does not bruise/bleed easily.  Psychiatric/Behavioral: Negative for substance abuse. The patient is not nervous/anxious.   All other systems reviewed and are negative.     Physical Exam/Data:   Vitals:   11/26/19 1301 11/26/19 1932 11/27/19 0508 11/27/19 0558  BP: 129/63 125/73 103/84   Pulse: 66 65 69   Resp: 16 18 16    Temp: 98.3 F (36.8 C) 98.6 F (37 C) 98.1 F (36.7 C)   TempSrc: Oral Oral Oral   SpO2: 99% 100% 97%   Weight: 77.5 kg   75.3 kg  Height: 6' (1.829 m)   6' (  1.829 m)    Intake/Output Summary (Last 24 hours) at 11/27/2019 0824 Last data filed at 11/27/2019 0504 Gross per 24 hour  Intake 645.24 ml  Output 1200 ml  Net -554.76 ml   Filed Weights   11/26/19 1301 11/27/19 0558  Weight: 77.5 kg 75.3 kg   Body mass index is 22.51 kg/m.   Physical Exam: General: Well developed, well nourished, in no acute distress. Head: Normocephalic, atraumatic, sclera non-icteric, no xanthomas, nares without discharge.  Neck: Negative for carotid bruits. JVD not elevated. Lungs: Clear bilaterally to auscultation without wheezes, rales, or rhonchi. Breathing is unlabored. Heart: RRR with S1 S2. No murmurs, rubs, or gallops appreciated. Abdomen: Soft, non-tender, non-distended with normoactive bowel sounds. No hepatomegaly. No rebound/guarding. No obvious abdominal masses. Msk:  Strength and tone appear normal for age. Extremities: No clubbing or cyanosis. No edema. RLE pulses are nonpalpable. LLE PT trace with DP nonpalpable. Neuro: Alert and oriented X 3. No facial asymmetry. No focal deficit. Moves all extremities spontaneously. Psych:  Responds to questions appropriately with a normal affect.   EKG:  The EKG was personally reviewed and demonstrates: NSR, 63 bpm, normal axis, no acute st/t changes Telemetry:  Telemetry was personally reviewed and  demonstrates: not on telemetry  Weights: Filed Weights   11/26/19 1301 11/27/19 0558  Weight: 77.5 kg 75.3 kg    Relevant CV Studies: No prior studies for review in Epic  Laboratory Data:  Chemistry Recent Labs  Lab 11/25/19 1418 11/26/19 0504 11/27/19 0411  NA 135 138 137  K 4.1 4.0 4.0  CL 101 104 102  CO2 25 25 29   GLUCOSE 109* 108* 118*  BUN 6 7 5*  CREATININE 0.76 0.70 0.69  CALCIUM 9.1 8.9 9.2  GFRNONAA >60 >60 >60  GFRAA >60 >60 >60  ANIONGAP 9 9 6     No results for input(s): PROT, ALBUMIN, AST, ALT, ALKPHOS, BILITOT in the last 168 hours. Hematology Recent Labs  Lab 11/25/19 1418 11/26/19 0504 11/27/19 0411  WBC 11.1* 9.3 10.1  RBC 4.37 4.39 4.37  HGB 13.3 13.3 13.5  HCT 39.9 39.8 39.7  MCV 91.3 90.7 90.8  MCH 30.4 30.3 30.9  MCHC 33.3 33.4 34.0  RDW 13.3 13.2 13.0  PLT 434* 430* 415*   Cardiac EnzymesNo results for input(s): TROPONINI in the last 168 hours. No results for input(s): TROPIPOC in the last 168 hours.  BNPNo results for input(s): BNP, PROBNP in the last 168 hours.  DDimer No results for input(s): DDIMER in the last 168 hours.  Radiology/Studies:  DG Chest 2 View  Result Date: 11/26/2019 IMPRESSION: No acute cardiopulmonary disease. Electronically Signed   By: 01/25/20 M.D.   On: 11/26/2019 13:06    Assessment and Plan:   1. Preoperative cardiac risk stratification: -[facility] Activity Status Index: METs > 4 without cardiac limitation. His limiting factor with regards to his functional status is secondary to lifestyle limiting claudication/critical limb ischemia  -Revised Cardiac Index: low risk for high risk noncardiac procedure with an estimated 0.9% risk of adverse cardiac event -He may proceed with noncardiac procedure without further workup at this time at an overall low risk as above  2. PAD: -Per vascular surgery  -On heparin gtt -Lipitor  -Consider obtaining lipid panel and A1c for further risk stratification   3.  Tobacco use: -Nicotine patch while admitted -Complete cessation is advised   4. Dispo: -Would recommend he follow up with his PCP or our office for further risk management as  an outpatient    For questions or updates, please contact [facility] Please consult [email or web address] for contact info under Cardiology/STEMI.   Signed, Eula Listen, PA-C [facility] Pager: [phone] 11/27/2019, 8:24 AM

## 2019-11-27 NOTE — Progress Notes (Addendum)
Initial Nutrition Assessment  DOCUMENTATION CODES:   Not applicable  INTERVENTION:   Ensure Enlive po TID, each supplement provides 350 kcal and 20 grams of protein  MVI daily   NUTRITION DIAGNOSIS:   Moderate Malnutrition related to poor appetite as evidenced by mild to moderate fat depletions, moderate  to severe muscle depletions.  GOAL:   Patient will meet greater than or equal to 90% of their needs  MONITOR:   PO intake, Supplement acceptance, Labs, Weight trends, Skin, I & O's  REASON FOR ASSESSMENT:   Malnutrition Screening Tool    ASSESSMENT:   59 year old male with known history of peripheral artery disease s/p endovascular intervention 1/29 now admitted with disabling claudication symptoms on the left   Met with pt in room today. Pt reports fair appetite and oral intake at baseline. Pt reports that his appetite is poor in hospital; pt eating < 50% of meals. Pt is willing to drink supplements while in hospital. RD will add supplements and MVI to help pt meet his estimated needs. Per chart, pt with 9lb(5%) weight loss in 2 weeks; this is significant.   Of note, pt documented to have h/o stomach surgery; pt reports that he has never had any surgeries on his stomach.   Medications reviewed and include: nicotine, NaCl '@75ml' /hr, heparin   Labs reviewed: BUN 5(L), Mg 1.8 wnl  NUTRITION - FOCUSED PHYSICAL EXAM:    Most Recent Value  Orbital Region  No depletion  Upper Arm Region  Moderate depletion  Thoracic and Lumbar Region  Mild depletion  Buccal Region  Mild depletion  Temple Region  Moderate depletion  Clavicle Bone Region  Moderate depletion  Clavicle and Acromion Bone Region  Moderate depletion  Scapular Bone Region  Moderate depletion  Dorsal Hand  Moderate depletion  Patellar Region  Severe depletion  Anterior Thigh Region  Severe depletion  Posterior Calf Region  Severe depletion  Edema (RD Assessment)  None  Hair  Reviewed  Eyes  Reviewed  Mouth   Reviewed  Skin  Reviewed  Nails  Reviewed     Diet Order:   Diet Order            Diet NPO time specified  Diet effective midnight        Diet heart healthy/carb modified Room service appropriate? Yes; Fluid consistency: Thin  Diet effective now             EDUCATION NEEDS:   Education needs have been addressed  Skin:  Skin Assessment: Reviewed RN Assessment  Last BM:  2/9  Height:   Ht Readings from Last 1 Encounters:  11/27/19 6' (1.829 m)    Weight:   Wt Readings from Last 1 Encounters:  11/27/19 75.3 kg    Ideal Body Weight:  80.9 kg  BMI:  Body mass index is 22.51 kg/m.  Estimated Nutritional Needs:   Kcal:  2100-2400kcal/day  Protein:  105-120g/day  Fluid:  >2.3L/day  Koleen Distance MS, RD, LDN Contact information available in Amion

## 2019-11-27 NOTE — Progress Notes (Signed)
PT Cancellation Note  Patient Details Name: Brendan Williams MRN: [id number] DOB: [birth year]   Cancelled Treatment:    Reason Eval/Treat Not Completed: Patient not medically ready(Pt here for resting claudication pain, pending surgical intervention with vascular team on Friday. Will hold evaluation until post-op phase.)  12:08 PM, 11/27/19 Rosamaria Lints, PT, DPT Physical Therapist - [facility]  [phone] (ASCOM)    Edge Mauger C 11/27/2019, 12:08 PM

## 2019-11-27 NOTE — Consult Note (Signed)
ANTICOAGULATION CONSULT NOTE - Initial Consult  Pharmacy Consult for Heparin Drip Indication:  Atherosclerotic Disease With Rest Pain  No Known Allergies  Patient Measurements: Height: 6' (182.9 cm) Weight: 170 lb 13.7 oz (77.5 kg) IBW/kg (Calculated) : 77.6 Heparin Dosing Weight: 77.6kg  Vital Signs: Temp: 98.1 F (36.7 C) (02/11 0508) Temp Source: Oral (02/11 0508) BP: 103/84 (02/11 0508) Pulse Rate: 69 (02/11 0508)  Labs: Recent Labs    11/25/19 1418 11/25/19 2118 11/26/19 0504 11/26/19 0504 11/26/19 1317 11/26/19 2213 11/27/19 0411  HGB 13.3  --  13.3  --   --   --  13.5  HCT 39.9  --  39.8  --   --   --  39.7  PLT 434*  --  430*  --   --   --  415*  APTT 36  --   --   --   --   --   --   LABPROT 12.5  --   --   --   --   --   --   INR 0.9  --   --   --   --   --   --   HEPARINUNFRC  --    < > 0.15*   < > 0.15* 0.46 0.38  CREATININE 0.76  --  0.70  --   --   --  0.69   < > = values in this interval not displayed.    Estimated Creatinine Clearance: 109 mL/min (by C-G formula based on SCr of 0.69 mg/dL).   Medical History: Past Medical History:  Diagnosis Date  . Peripheral arterial disease (HCC)     Medications:  No PTA anticoagulant of record  Assessment: Brendan Williams is a 59 y.o. male.  Patient returns in follow-up of his severe peripheral arterial disease.  He underwent aortoiliac intervention about a week and a half ago, but continues to have persistent disabling rest pain of the right foot.  Pharmacy has been consulted to initiate and monitor a heparin drip.  Started with 4000 unit bolus and 1000units/hr  0209 2118 HL 0.12 (2300 unit bolus and rate incr to 1300 units) 0210 0504 HL 0.15 (no bolus and rate incr to 1400 units/hr) 0210 1317 HL 0.15  Goal of Therapy:  Heparin level 0.3-0.7 units/ml Monitor platelets by anticoagulation protocol: Yes   Plan:  02/11 @ 0400 HL 0.38 therapeutic. Will continue current rate and will recheck HL w/  am labs, CBC stable will continue to monitor.  Thomasene Ripple, PharmD Clinical Pharmacist 11/27/2019 5:18 AM

## 2019-11-28 ENCOUNTER — Inpatient Hospital Stay: Payer: Self-pay

## 2019-11-28 ENCOUNTER — Inpatient Hospital Stay: Payer: Self-pay | Admitting: Anesthesiology

## 2019-11-28 ENCOUNTER — Encounter
Admission: AD | Disposition: A | Discharge status: Home Health | Payer: Self-pay | Source: Ambulatory Visit | Attending: Vascular Surgery

## 2019-11-28 DIAGNOSIS — I70261 Atherosclerosis of native arteries of extremities with gangrene, right leg: Secondary | ICD-10-CM

## 2019-11-28 DIAGNOSIS — I70222 Atherosclerosis of native arteries of extremities with rest pain, left leg: Secondary | ICD-10-CM

## 2019-11-28 HISTORY — PX: ENDARTERECTOMY FEMORAL: SHX5804

## 2019-11-28 HISTORY — PX: APPLICATION OF WOUND VAC: SHX5189

## 2019-11-28 HISTORY — PX: CENTRAL VENOUS CATHETER INSERTION: SHX401

## 2019-11-28 LAB — MAGNESIUM: Magnesium: 1.9 mg/dL (ref 1.7–2.4)

## 2019-11-28 LAB — LIPID PANEL
Cholesterol: 126 mg/dL (ref 0–200)
HDL: 43 mg/dL (ref >40)
LDL Cholesterol: 67 mg/dL (ref 0–99)
Total CHOL/HDL Ratio: 2.9 RATIO
Triglycerides: 79 mg/dL (ref <150)
VLDL: 16 mg/dL (ref 0–40)

## 2019-11-28 LAB — RESPIRATORY PANEL BY RT PCR (FLU A&B, COVID)
Influenza A by PCR: NEGATIVE
Influenza B by PCR: NEGATIVE
SARS Coronavirus 2 by RT PCR: NEGATIVE

## 2019-11-28 LAB — CBC
HCT: 39.2 % (ref 39.0–52.0)
Hemoglobin: 13.2 g/dL (ref 13.0–17.0)
MCH: 30.5 pg (ref 26.0–34.0)
MCHC: 33.7 g/dL (ref 30.0–36.0)
MCV: 90.5 fL (ref 80.0–100.0)
Platelets: 414 10*3/uL — ABNORMAL HIGH (ref 150–400)
RBC: 4.33 MIL/uL (ref 4.22–5.81)
RDW: 13 % (ref 11.5–15.5)
WBC: 10.8 10*3/uL — ABNORMAL HIGH (ref 4.0–10.5)
nRBC: 0 % (ref 0.0–0.2)

## 2019-11-28 LAB — GLUCOSE, CAPILLARY: Glucose-Capillary: 168 mg/dL — ABNORMAL HIGH (ref 70–99)

## 2019-11-28 LAB — BASIC METABOLIC PANEL
Anion gap: 10 (ref 5–15)
BUN: 8 mg/dL (ref 6–20)
CO2: 24 mmol/L (ref 22–32)
Calcium: 9.1 mg/dL (ref 8.9–10.3)
Chloride: 102 mmol/L (ref 98–111)
Creatinine, Ser: 0.69 mg/dL (ref 0.61–1.24)
GFR calc Af Amer: >60 mL/min (ref >60)
GFR calc non Af Amer: >60 mL/min (ref >60)
Glucose, Bld: 111 mg/dL — ABNORMAL HIGH (ref 70–99)
Potassium: 4 mmol/L (ref 3.5–5.1)
Sodium: 136 mmol/L (ref 135–145)

## 2019-11-28 LAB — TYPE AND SCREEN
ABO/RH(D): B POS
Antibody Screen: NEGATIVE

## 2019-11-28 LAB — PROTIME-INR
INR: 0.9 (ref 0.8–1.2)
Prothrombin Time: 12.3 seconds (ref 11.4–15.2)

## 2019-11-28 LAB — HEMOGLOBIN A1C
Hgb A1c MFr Bld: 6 % — ABNORMAL HIGH (ref 4.8–5.6)
Mean Plasma Glucose: 125.5 mg/dL

## 2019-11-28 SURGERY — ENDARTERECTOMY, FEMORAL
Anesthesia: General | Laterality: Bilateral

## 2019-11-28 MED ORDER — SUGAMMADEX SODIUM 200 MG/2ML IV SOLN
INTRAVENOUS | Status: DC | PRN
  Administered 2019-11-28: 160 mg via INTRAVENOUS

## 2019-11-28 MED ORDER — PROPOFOL 10 MG/ML IV BOLUS
INTRAVENOUS | Status: DC | PRN
  Administered 2019-11-28: 180 mg via INTRAVENOUS

## 2019-11-28 MED ORDER — VISTASEAL 10 ML SINGLE DOSE KIT
PACK | CUTANEOUS | Status: DC | PRN
  Administered 2019-11-28: 10 mL via TOPICAL

## 2019-11-28 MED ORDER — OXYCODONE HCL 5 MG PO TABS: 5.0000 mg | ORAL_TABLET | Freq: Once | ORAL | Status: DC | PRN

## 2019-11-28 MED ORDER — CEFAZOLIN SODIUM-DEXTROSE 2-3 GM-%(50ML) IV SOLR
INTRAVENOUS | Status: DC | PRN
  Administered 2019-11-28 (×2): 2 g via INTRAVENOUS

## 2019-11-28 MED ORDER — ACETAMINOPHEN 10 MG/ML IV SOLN: 1000.0000 mg | Freq: Once | INTRAVENOUS | Status: DC | PRN

## 2019-11-28 MED ORDER — HEPARIN SODIUM (PORCINE) 1000 UNIT/ML IJ SOLN
INTRAMUSCULAR | Status: AC
  Filled 2019-11-28: qty 1

## 2019-11-28 MED ORDER — ONDANSETRON HCL 4 MG/2ML IJ SOLN
INTRAMUSCULAR | Status: AC
  Filled 2019-11-28: qty 2

## 2019-11-28 MED ORDER — ACETAMINOPHEN 10 MG/ML IV SOLN
INTRAVENOUS | Status: DC | PRN
  Administered 2019-11-28: 1000 mg via INTRAVENOUS

## 2019-11-28 MED ORDER — HEPARIN SODIUM (PORCINE) 5000 UNIT/ML IJ SOLN
INTRAMUSCULAR | Status: AC
  Filled 2019-11-28: qty 1

## 2019-11-28 MED ORDER — CEFAZOLIN SODIUM 1 G IJ SOLR
INTRAMUSCULAR | Status: AC
  Filled 2019-11-28: qty 20

## 2019-11-28 MED ORDER — FENTANYL CITRATE (PF) 100 MCG/2ML IJ SOLN
INTRAMUSCULAR | Status: AC
  Filled 2019-11-28: qty 2

## 2019-11-28 MED ORDER — ROCURONIUM BROMIDE 100 MG/10ML IV SOLN
INTRAVENOUS | Status: DC | PRN
  Administered 2019-11-28: 20 mg via INTRAVENOUS
  Administered 2019-11-28 (×2): 10 mg via INTRAVENOUS
  Administered 2019-11-28 (×2): 20 mg via INTRAVENOUS
  Administered 2019-11-28: 50 mg via INTRAVENOUS
  Administered 2019-11-28: 20 mg via INTRAVENOUS

## 2019-11-28 MED ORDER — SEVOFLURANE IN SOLN
RESPIRATORY_TRACT | Status: AC
  Filled 2019-11-28: qty 250

## 2019-11-28 MED ORDER — LABETALOL HCL 5 MG/ML IV SOLN
INTRAVENOUS | Status: AC
  Administered 2019-11-28: 19:00:00 5 mg via INTRAVENOUS
  Filled 2019-11-28: qty 4

## 2019-11-28 MED ORDER — DEXMEDETOMIDINE HCL 200 MCG/2ML IV SOLN
INTRAVENOUS | Status: DC | PRN
  Administered 2019-11-28: 12 ug via INTRAVENOUS

## 2019-11-28 MED ORDER — FENTANYL CITRATE (PF) 100 MCG/2ML IJ SOLN
25.0000 ug | INTRAMUSCULAR | Status: AC | PRN
  Administered 2019-11-28 (×6): 25 ug via INTRAVENOUS

## 2019-11-28 MED ORDER — SODIUM CHLORIDE FLUSH 0.9 % IV SOLN
INTRAVENOUS | Status: AC
  Filled 2019-11-28: qty 30

## 2019-11-28 MED ORDER — KETAMINE HCL 50 MG/ML IJ SOLN
INTRAMUSCULAR | Status: AC
  Filled 2019-11-28: qty 10

## 2019-11-28 MED ORDER — LORAZEPAM 2 MG/ML IJ SOLN
1.0000 mg | INTRAMUSCULAR | Status: DC | PRN
  Administered 2019-11-28 – 2019-11-29 (×8): 2 mg via INTRAVENOUS
  Filled 2019-11-28 (×8): qty 1

## 2019-11-28 MED ORDER — PROPOFOL 500 MG/50ML IV EMUL
INTRAVENOUS | Status: AC
  Filled 2019-11-28: qty 50

## 2019-11-28 MED ORDER — VISTASEAL 10 ML SINGLE DOSE KIT
PACK | CUTANEOUS | Status: AC
  Filled 2019-11-28: qty 10

## 2019-11-28 MED ORDER — ACETAMINOPHEN 10 MG/ML IV SOLN
INTRAVENOUS | Status: AC
  Filled 2019-11-28: qty 100

## 2019-11-28 MED ORDER — PHENYLEPHRINE HCL-NACL 10-0.9 MG/250ML-% IV SOLN
INTRAVENOUS | Status: DC | PRN
  Administered 2019-11-28: 25 ug/min via INTRAVENOUS

## 2019-11-28 MED ORDER — PHENYLEPHRINE HCL (PRESSORS) 10 MG/ML IV SOLN
INTRAVENOUS | Status: DC | PRN
  Administered 2019-11-28 (×2): 100 ug via INTRAVENOUS
  Administered 2019-11-28: 200 ug via INTRAVENOUS
  Administered 2019-11-28 (×2): 100 ug via INTRAVENOUS
  Administered 2019-11-28: 150 ug via INTRAVENOUS
  Administered 2019-11-28: 200 ug via INTRAVENOUS
  Administered 2019-11-28: 50 ug via INTRAVENOUS

## 2019-11-28 MED ORDER — MIDAZOLAM HCL 2 MG/2ML IJ SOLN
INTRAMUSCULAR | Status: AC
  Filled 2019-11-28: qty 2

## 2019-11-28 MED ORDER — DEXMEDETOMIDINE HCL IN NACL 80 MCG/20ML IV SOLN
INTRAVENOUS | Status: AC
  Filled 2019-11-28: qty 20

## 2019-11-28 MED ORDER — ASPIRIN EC 81 MG PO TBEC
81.0000 mg | DELAYED_RELEASE_TABLET | Freq: Every day | ORAL | Status: DC
  Administered 2019-11-28 – 2019-12-01 (×4): 81 mg via ORAL
  Filled 2019-11-28 (×4): qty 1

## 2019-11-28 MED ORDER — LABETALOL HCL 5 MG/ML IV SOLN
5.0000 mg | INTRAVENOUS | Status: AC | PRN
  Administered 2019-11-28: 19:00:00 5 mg via INTRAVENOUS

## 2019-11-28 MED ORDER — LIDOCAINE HCL (PF) 2 % IJ SOLN
INTRAMUSCULAR | Status: AC
  Filled 2019-11-28: qty 10

## 2019-11-28 MED ORDER — FENTANYL CITRATE (PF) 100 MCG/2ML IJ SOLN
INTRAMUSCULAR | Status: DC | PRN
  Administered 2019-11-28: 50 ug via INTRAVENOUS
  Administered 2019-11-28: 200 ug via INTRAVENOUS
  Administered 2019-11-28 (×3): 50 ug via INTRAVENOUS

## 2019-11-28 MED ORDER — LIDOCAINE HCL (CARDIAC) PF 100 MG/5ML IV SOSY
PREFILLED_SYRINGE | INTRAVENOUS | Status: DC | PRN
  Administered 2019-11-28: 80 mg via INTRAVENOUS

## 2019-11-28 MED ORDER — DEXAMETHASONE SODIUM PHOSPHATE 10 MG/ML IJ SOLN
INTRAMUSCULAR | Status: AC
  Filled 2019-11-28: qty 1

## 2019-11-28 MED ORDER — PHENYLEPHRINE HCL (PRESSORS) 10 MG/ML IV SOLN
INTRAVENOUS | Status: AC
  Filled 2019-11-28: qty 1

## 2019-11-28 MED ORDER — LACTATED RINGERS IV SOLN: INTRAVENOUS | Status: DC | PRN

## 2019-11-28 MED ORDER — PROPOFOL 10 MG/ML IV BOLUS
INTRAVENOUS | Status: AC
  Filled 2019-11-28: qty 20

## 2019-11-28 MED ORDER — HEPARIN SODIUM (PORCINE) 1000 UNIT/ML IJ SOLN
INTRAMUSCULAR | Status: DC | PRN
  Administered 2019-11-28: 3000 [IU] via INTRAVENOUS
  Administered 2019-11-28: 5000 [IU] via INTRAVENOUS
  Administered 2019-11-28: 2000 [IU] via INTRAVENOUS

## 2019-11-28 MED ORDER — SODIUM CHLORIDE 0.9 % IV SOLN
INTRAVENOUS | Status: DC | PRN
  Administered 2019-11-28: 500 mL via INTRAMUSCULAR

## 2019-11-28 MED ORDER — ONDANSETRON HCL 4 MG/2ML IJ SOLN: 4.0000 mg | Freq: Once | INTRAMUSCULAR | Status: DC | PRN

## 2019-11-28 MED ORDER — HYDROMORPHONE HCL 1 MG/ML IJ SOLN
INTRAMUSCULAR | Status: AC
  Administered 2019-11-28: 0.5 mg via INTRAVENOUS
  Filled 2019-11-28: qty 1

## 2019-11-28 MED ORDER — PROPOFOL 500 MG/50ML IV EMUL
INTRAVENOUS | Status: DC | PRN
  Administered 2019-11-28: 50 ug/kg/min via INTRAVENOUS

## 2019-11-28 MED ORDER — ROCURONIUM BROMIDE 50 MG/5ML IV SOLN
INTRAVENOUS | Status: AC
  Filled 2019-11-28: qty 1

## 2019-11-28 MED ORDER — APIXABAN 5 MG PO TABS
5.0000 mg | ORAL_TABLET | Freq: Two times a day (BID) | ORAL | Status: DC
  Administered 2019-11-29 – 2019-12-01 (×5): 5 mg via ORAL
  Filled 2019-11-28 (×5): qty 1

## 2019-11-28 MED ORDER — CEFAZOLIN SODIUM 1 G IJ SOLR
INTRAMUSCULAR | Status: AC
  Filled 2019-11-28: qty 10

## 2019-11-28 MED ORDER — SUGAMMADEX SODIUM 200 MG/2ML IV SOLN
INTRAVENOUS | Status: AC
  Filled 2019-11-28: qty 2

## 2019-11-28 MED ORDER — KETAMINE HCL 10 MG/ML IJ SOLN
INTRAMUSCULAR | Status: DC | PRN
  Administered 2019-11-28: 20 mg via INTRAVENOUS

## 2019-11-28 MED ORDER — MIDAZOLAM HCL 2 MG/2ML IJ SOLN
INTRAMUSCULAR | Status: DC | PRN
  Administered 2019-11-28: 2 mg via INTRAVENOUS

## 2019-11-28 MED ORDER — DEXMEDETOMIDINE HCL IN NACL 400 MCG/100ML IV SOLN: 0.2000 ug/kg/h | INTRAVENOUS | Status: DC

## 2019-11-28 MED ORDER — CHLORHEXIDINE GLUCONATE CLOTH 2 % EX PADS
6.0000 | MEDICATED_PAD | Freq: Every day | CUTANEOUS | Status: DC
  Administered 2019-11-28 – 2019-12-01 (×4): 6 via TOPICAL

## 2019-11-28 MED ORDER — HYDROMORPHONE HCL 1 MG/ML IJ SOLN: 0.5000 mg | INTRAMUSCULAR | Status: DC | PRN

## 2019-11-28 MED ORDER — ONDANSETRON HCL 4 MG/2ML IJ SOLN
INTRAMUSCULAR | Status: DC | PRN
  Administered 2019-11-28: 4 mg via INTRAVENOUS

## 2019-11-28 MED ORDER — OXYCODONE HCL 5 MG/5ML PO SOLN: 5.0000 mg | Freq: Once | ORAL | Status: DC | PRN

## 2019-11-28 MED ORDER — DEXAMETHASONE SODIUM PHOSPHATE 10 MG/ML IJ SOLN
INTRAMUSCULAR | Status: DC | PRN
  Administered 2019-11-28: 5 mg via INTRAVENOUS

## 2019-11-28 SURGICAL SUPPLY — 103 items
APPLIER CLIP 11 MED OPEN (CLIP)
APPLIER CLIP 9.375 SM OPEN (CLIP)
BAG COUNTER SPONGE EZ (MISCELLANEOUS) IMPLANT
BAG DECANTER FOR FLEXI CONT (MISCELLANEOUS) ×4 IMPLANT
BAG ISOLATATION DRAPE 20X20 ST (DRAPES) IMPLANT
BALLN DORADO 8X100X80 (BALLOONS) ×4
BALLN DORADO 8X60X80 (BALLOONS) ×4
BALLN LUTONIX 018 4X150X130 (BALLOONS) ×8
BALLN LUTONIX 018 5X150X130 (BALLOONS) ×4
BALLN LUTONIX 018 5X300X130 (BALLOONS) ×4
BALLN ULTRV 018 3X100X75 (BALLOONS) ×4
BALLOON DORADO 8X100X80 (BALLOONS) ×2 IMPLANT
BALLOON DORADO 8X60X80 (BALLOONS) ×2 IMPLANT
BALLOON LUTONIX 018 4X150X130 (BALLOONS) ×4 IMPLANT
BALLOON LUTONIX 018 5X150X130 (BALLOONS) ×2 IMPLANT
BALLOON LUTONIX 018 5X300X130 (BALLOONS) ×2 IMPLANT
BALLOON ULTRV 018 3X100X75 (BALLOONS) ×2 IMPLANT
BLADE SURG 15 STRL LF DISP TIS (BLADE) ×2 IMPLANT
BLADE SURG 15 STRL SS (BLADE) ×2
BLADE SURG SZ11 CARB STEEL (BLADE) ×4 IMPLANT
BOOT SUTURE AID YELLOW STND (SUTURE) ×4 IMPLANT
BRUSH SCRUB EZ  4% CHG (MISCELLANEOUS) ×2
BRUSH SCRUB EZ 4% CHG (MISCELLANEOUS) ×2 IMPLANT
CANISTER PENUMBRA ENGINE (MISCELLANEOUS) ×4 IMPLANT
CANISTER SUCT 1200ML W/VALVE (MISCELLANEOUS) ×4 IMPLANT
CATH BEACON 5 .035 40 KMP TP (CATHETERS) ×2 IMPLANT
CATH BEACON 5 .035 65 KMP TIP (CATHETERS) ×4 IMPLANT
CATH BEACON 5 .038 40 KMP TP (CATHETERS) ×2
CATH EMBOLECTOMY 3X80 (CATHETERS) ×8 IMPLANT
CATH FOGERTY 4X80 WAS (CATHETERS) ×4 IMPLANT
CATH INDIGO D 50CM (CATHETERS) ×4 IMPLANT
CATH PIG 70CM (CATHETERS) ×4 IMPLANT
CHLORAPREP W/TINT 26 (MISCELLANEOUS) ×4 IMPLANT
CLIP APPLIE 11 MED OPEN (CLIP) IMPLANT
CLIP APPLIE 9.375 SM OPEN (CLIP) IMPLANT
COUNTER SPONGE BAG EZ (MISCELLANEOUS)
COVER WAND RF STERILE (DRAPES) ×4 IMPLANT
DERMABOND ADVANCED (GAUZE/BANDAGES/DRESSINGS) ×2
DERMABOND ADVANCED .7 DNX12 (GAUZE/BANDAGES/DRESSINGS) ×2 IMPLANT
DEVICE PRESTO INFLATION (MISCELLANEOUS) ×8 IMPLANT
DRAPE INCISE IOBAN 66X45 STRL (DRAPES) ×4 IMPLANT
DRAPE ISOLATE BAG 20X20 STRL (DRAPES)
ELECT CAUTERY BLADE 6.4 (BLADE) ×4 IMPLANT
ELECT REM PT RETURN 9FT ADLT (ELECTROSURGICAL) ×4
ELECTRODE REM PT RTRN 9FT ADLT (ELECTROSURGICAL) ×2 IMPLANT
GLIDEWIRE ADV .035X180CM (WIRE) ×4 IMPLANT
GLOVE BIO SURGEON STRL SZ7 (GLOVE) ×8 IMPLANT
GLOVE INDICATOR 7.5 STRL GRN (GLOVE) ×4 IMPLANT
GORE VIABAHN ENDOPROSTHESIS IMPLANT
GOWN STRL REUS W/ TWL LRG LVL3 (GOWN DISPOSABLE) ×2 IMPLANT
GOWN STRL REUS W/ TWL XL LVL3 (GOWN DISPOSABLE) ×4 IMPLANT
GOWN STRL REUS W/TWL LRG LVL3 (GOWN DISPOSABLE) ×2
GOWN STRL REUS W/TWL XL LVL3 (GOWN DISPOSABLE) ×4
HEMOSTAT SURGICEL 2X3 (HEMOSTASIS) ×8 IMPLANT
IV NS 500ML (IV SOLUTION) ×2
IV NS 500ML BAXH (IV SOLUTION) ×2 IMPLANT
KIT PREVENA INCISION MGT 13 (CANNISTER) ×8 IMPLANT
KIT TURNOVER KIT A (KITS) ×4 IMPLANT
LABEL OR SOLS (LABEL) ×4 IMPLANT
LOOP RED MAXI  1X406MM (MISCELLANEOUS) ×4
LOOP VESSEL MAXI 1X406 RED (MISCELLANEOUS) ×4 IMPLANT
LOOP VESSEL MINI 0.8X406 BLUE (MISCELLANEOUS) ×4 IMPLANT
LOOPS BLUE MINI 0.8X406MM (MISCELLANEOUS) ×4
NDL SAFETY ECLIPSE 18X1.5 (NEEDLE) ×2 IMPLANT
NEEDLE HYPO 18GX1.5 SHARP (NEEDLE) ×2
NS IRRIG 500ML POUR BTL (IV SOLUTION) ×4 IMPLANT
PACK ANGIOGRAPHY (CUSTOM PROCEDURE TRAY) ×4 IMPLANT
PACK BASIN MAJOR ARMC (MISCELLANEOUS) ×4 IMPLANT
PACK UNIVERSAL (MISCELLANEOUS) ×4 IMPLANT
PATCH CAROTID ECM VASC 1X10 (Prosthesis & Implant Heart) ×8 IMPLANT
SHEATH BRITE TIP 6FRX11 (SHEATH) ×8 IMPLANT
SHEATH BRITE TIP 8FRX11 (SHEATH) ×8 IMPLANT
STENT LIFESTREAM 12X38X80 (Permanent Stent) ×8 IMPLANT
STENT LIFESTREAM 9X58X80 (Permanent Stent) ×16 IMPLANT
STENT VIABAHN 6X150X120 (Permanent Stent) ×4 IMPLANT
STENT VIABAHN 6X250X120 (Permanent Stent) ×4 IMPLANT
STENT VIABAHN 9X10X120 (Permanent Stent) ×4 IMPLANT
STENT VIABAHN 9X7.5X120 (Permanent Stent) ×4 IMPLANT
SUT MNCRL 4-0 (SUTURE) ×4
SUT MNCRL 4-0 27XMFL (SUTURE) ×4
SUT PROLENE 5 0 RB 1 DA (SUTURE) ×8 IMPLANT
SUT PROLENE 6 0 BV (SUTURE) ×56 IMPLANT
SUT PROLENE 7 0 BV 1 (SUTURE) ×8 IMPLANT
SUT SILK 2 0 (SUTURE) ×2
SUT SILK 2-0 18XBRD TIE 12 (SUTURE) ×2 IMPLANT
SUT SILK 3 0 (SUTURE) ×2
SUT SILK 3-0 18XBRD TIE 12 (SUTURE) ×2 IMPLANT
SUT SILK 4 0 (SUTURE) ×2
SUT SILK 4-0 18XBRD TIE 12 (SUTURE) ×2 IMPLANT
SUT VIC AB 2-0 CT1 27 (SUTURE) ×8
SUT VIC AB 2-0 CT1 TAPERPNT 27 (SUTURE) ×8 IMPLANT
SUT VIC AB 3-0 SH 27 (SUTURE) ×4
SUT VIC AB 3-0 SH 27X BRD (SUTURE) ×4 IMPLANT
SUT VICRYL+ 3-0 36IN CT-1 (SUTURE) ×8 IMPLANT
SUTURE MNCRL 4-0 27XMF (SUTURE) ×4 IMPLANT
SYR 20ML LL LF (SYRINGE) ×4 IMPLANT
SYR 3ML LL SCALE MARK (SYRINGE) ×4 IMPLANT
SYR 5ML LL (SYRINGE) ×4 IMPLANT
TRAY FOLEY MTR SLVR 16FR STAT (SET/KITS/TRAYS/PACK) ×4 IMPLANT
TUBING CONTRAST HIGH PRESS 72 (TUBING) ×4 IMPLANT
WIRE G V18X300CM (WIRE) ×4 IMPLANT
WIRE J 3MM .035X145CM (WIRE) ×8 IMPLANT
WIRE MAGIC TOR.035 180C (WIRE) ×4 IMPLANT

## 2019-11-28 NOTE — Anesthesia Procedure Notes (Signed)
Procedure Name: Intubation Date/Time: 11/28/2019 12:35 PM Performed by: Lowry Bowl, CRNA Pre-anesthesia Checklist: Patient identified, Emergency Drugs available, Suction available, Patient being monitored and Timeout performed Patient Re-evaluated:Patient Re-evaluated prior to induction Oxygen Delivery Method: Circle system utilized Preoxygenation: Pre-oxygenation with 100% oxygen Induction Type: IV induction Ventilation: Mask ventilation without difficulty Laryngoscope Size: Mac and 4 Grade View: Grade II Tube type: Oral Tube size: 7.5 mm Number of attempts: 1 Airway Equipment and Method: Stylet Placement Confirmation: ETT inserted through vocal cords under direct vision,  positive ETCO2 and breath sounds checked- equal and bilateral Secured at: 23 cm Tube secured with: Tape Dental Injury: Teeth and Oropharynx as per pre-operative assessment

## 2019-11-28 NOTE — Anesthesia Preprocedure Evaluation (Signed)
Anesthesia Evaluation  Patient identified by MRN, date of birth, ID band Patient awake    Reviewed: Allergy & Precautions, NPO status , Patient's Chart, lab work & pertinent test results  History of Anesthesia Complications Negative for: history of anesthetic complications  Airway Mallampati: II  TM Distance: >3 FB Neck ROM: Full    Dental no notable dental hx. (+) Teeth Intact, Dental Advisory Given   Pulmonary neg pulmonary ROS, neg sleep apnea, neg COPD, Current Smoker and Patient abstained from smoking.,    Pulmonary exam normal breath sounds clear to auscultation       Cardiovascular Exercise Tolerance: Good METS(-) hypertension+ Peripheral Vascular Disease  (-) CAD and (-) Past MI negative cardio ROS  (-) dysrhythmias  Rhythm:Regular Rate:Normal - Systolic murmurs    Neuro/Psych negative neurological ROS  negative psych ROS   GI/Hepatic neg GERD  ,(+)     (-) substance abuse  ,   Endo/Other  neg diabetes  Renal/GU negative Renal ROS     Musculoskeletal   Abdominal   Peds  Hematology   Anesthesia Other Findings Past Medical History: No date: Peripheral arterial disease (HCC)  Reproductive/Obstetrics                             Anesthesia Physical Anesthesia Plan  ASA: III  Anesthesia Plan: General   Post-op Pain Management:    Induction: Intravenous  PONV Risk Score and Plan: 2 and Ondansetron, Dexamethasone and Midazolam  Airway Management Planned: Oral ETT  Additional Equipment: None and Arterial line  Intra-op Plan:   Post-operative Plan: Extubation in OR  Informed Consent: I have reviewed the patients History and Physical, chart, labs and discussed the procedure including the risks, benefits and alternatives for the proposed anesthesia with the patient or authorized representative who has indicated his/her understanding and acceptance.     Dental advisory  given  Plan Discussed with: CRNA and Surgeon  Anesthesia Plan Comments: (Discussed risks of anesthesia with patient, including PONV, blood loss requiring blood transfusion, sore throat, lip/dental damage. Rare risks discussed as well, such as cardiorespiratory sequelae. Patient understands.)        Anesthesia Quick Evaluation

## 2019-11-28 NOTE — Progress Notes (Addendum)
Pt right foot much more red today than it had been the previous 2 days. Red color streaking up towards the patients middle calf.  Able to hear pulse with doppler but it sounded different than the left foot. Pt in 10/10 pain and unable to sit still. Morphine given at 0902. Will reasses.

## 2019-11-28 NOTE — Op Note (Signed)
OPERATIVE NOTE   PROCEDURE: 1. Left common femoral, profunda femoris, and superficial femoral artery endarterectomies 2.   Right common femoral, profunda femoris, and superficial femoral artery endarterectomies 3.   Fogarty embolectomy balloon to the right iliac arteries using a 4 Fogarty balloon 4.   Fogarty embolectomy balloon to the left iliac arteries using a 3 and a 4 Fogarty balloon 5.   Catheter placement into the aorta from bilateral femoral approaches 6.   Aortogram and bilateral lower extremity angiograms 7.   Kissing balloon stent placements into the common iliac arteries bilaterally with 9 mm diameter by 58 mm length lifestream stents 8.   Additional stent placement into the right iliac arteries both common and external with 9 mm diameter by 58 mm length lifestream stent and a 9 mm diameter by 7.5 cm length Viabahn stent 9.   Additional stent placement to the left iliac arteries both common and external with 9 mm diameter by 58 mm length lifestream stent and a 9 mm diameter by 10 cm length Viabahn stent 10.  Additional stent placement into the aorta from bilateral approaches to extend inflow proximally using 12 mm diameter by 38 mm length lifestream stents from each side 11.  Angioplasty of the right anterior tibial artery with 3 mm diameter by 10 cm length angioplasty balloon 12.  Viabahn stent placement to the right SFA and proximal popliteal artery with 6 mm diameter by 25 cm length stent 13.  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 significant plaque in bilateral common  femoral, profunda femoris, and superficial femoral arteries 2.  Thrombosis of the previous bilateral iliac stent placements requiring Fogarty embolectomy/thrombectomy and relining with new covered stents from the aorta down to the distal external iliac arteries 3.  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 4.  Left SFA with high-grade stenosis in the distal segment and two-vessel runoff distally  SPECIMEN(S): Bilateral common femoral, profunda femoris, and superficial femoral artery plaque.  INDICATIONS:  Patient presents with worsening ischemic symptoms now with gangrene of the right fifth toe and rest pain.  He has had previous aortoiliac intervention but had known severe common femoral disease as well as infrainguinal disease.  Bilateral femoral endarterectomies as well as endovascular therapy are planned to try to improve perfusion. The risks and benefits as well as alternative therapies including intervention were reviewed in detail all questions were answered the patient agrees to proceed with surgery.  DESCRIPTION: After obtaining full informed written consent, the patient was brought back to the operating room and placed supine upon the operating table. The patient received IV antibiotics prior to induction. After obtaining adequate anesthesia, the patient was prepped and draped in the standard fashion appropriate time out is called.   With myself working on the right and Dr. Delana Meyer working on the left we began by dissecting out the femoral arteries on each side. Vertical incisions were created overlying both femoral arteries. The common femoral artery proximally, and superficial femoral artery, and primary profunda femoris artery branches were encircled with  vessel loops and prepared for control. Both femoral arteries were found to have significant plaque from the common femoral  artery into the profunda and superficial femoral arteries.  Unfortunately, there was also no pulse in either femoral artery despite previous aortoiliac intervention.   5000 units of heparin was given and allowed circulate for 5 minutes.  Additional heparin was given later in the procedure as needed  Attention is then turned to the right femoral artery. An arteriotomy is made with 11 blade and extended with Potts scissors in the common femoral artery and carried down onto the first 2-3 cm of the superficial femoral artery. An endarterectomy was then performed. The [facility] was used to create a plane. The proximal endpoint was cut flush with tenotomy scissors. This was in the proximal common femoral artery. An eversion endarterectomy was then performed for the first 2-3 cm of the main profunda femoris branch as well as a short segment eversion endarterectomy on the secondary profunda femoris branch. Good backbleeding was then seen. The distal endpoint of the superficial femoral artery endarterectomy was created with gentle traction and the distal endpoint was clean.  Despite the endarterectomy, the artery remained pulseless.  3 passes with a 4 Fogarty embolectomy balloon were done proximally but the balloon would not pass beyond about 20 cm.  Even though a large amount of thrombus was returned, satisfactory inflow was not achieved.  The left femoral artery is then addressed. Arteriotomy is made in the common femoral artery and extended down into the first 2 cm or so of the superficial femoral artery. Similarly, an endarterectomy was performed with the [facility]. The proximal endpoint was cut flush with tenotomy scissors in the proximal common femoral artery.  The artery remained pulseless however.  The profunda femoris which was diseased at least 2 to 3 cm downstream was addressed and treated with an eversion endarterectomy and this was performed with a hemostat and gentle traction. The  arteriotomy was carried down onto the superficial femoral artery and the endarterectomy was continued to this point. The distal endpoint was also extended about 2 cm more distal with hemostats pulling a large plug of plaque out of the proximal superficial femoral artery.  Again, despite the extensive endarterectomy the artery remained pulseless.  A 4 Fogarty embolectomy balloon was passed proximally twice with a large amount of thrombus returned but still only a trickle of inflow.  This was despite the balloon passing up to 30 cm without a lot of resistance. At this point, it was clear his previous iliac stents were thrombosed and would need revision and endovascular therapy.  Pigtail catheter was placed up the left and imaging was performed which showed occlusion with thrombus and stenosis throughout the right iliac system.  The left iliac system was also full of thrombus although a small amount of flow was seen through the thrombus.  This was after Fogarty embolectomy so we elected to place a covered stents starting at the distal aorta and proximal common iliac arteries bilaterally.  8 [place] sheaths were placed bilaterally and intraluminal flow was confirmed in the aorta from each side to ensure that we are not subintimal.  First set of kissing stents in the common iliac arteries bilaterally with 9 mm diameter by 58 mm length lifestream stents inflated to 12 atm on each side.  An additional 9 mm diameter by 58 mm length lifestream stent was then deployed in the distal common and proximal external iliac artery on each side through the  8 French sheaths and inflated to 12 atm.  To take this down to just above the arteriotomy in the external iliac artery and completely cover the lesions, 9 mm diameter Viabahn stents were selected.  On the right it was 7.5 cm long on the left it was 10 cm long.  The Viabahn stents were postdilated with 8 mm balloons.  Completion imaging after this showed very poor inflow with  thrombus in the distal aorta limiting flow through both sets of stents.  With this, we elected to stent into the aorta on each side and a double lumen shotgun fashion.  Larger stents which were 12 mm in diameter by 37 mm in length were selected and these were inflated simultaneously in the mid and distal aorta up to 10 atm.  These went back into the initial 9 mm diameter by 58 mm length lifestream stents in the common iliac artery.  With this, there was return of pulsatile flow in both groins and pigtail catheter placed back into the aorta showed brisk flow with less than 10% residual stenosis on the right.  On the left, there remained some thrombus in the iliac arteries that was then treated with a 3 Fogarty embolectomy balloon with resolution of the thrombus and brisk inflow seen. Attention was then turned to the infra inguinal disease that was known.  Although our images were very limited on his previous angiogram due to poor inflow, there appeared to be significant disease on both sides.  The 8 French sheath first was turned antegrade on the right and imaging was performed which showed a medium to long segment occlusion in the mid to distal SFA of about 15 cm.  There was reconstitution of the above-knee popliteal artery which was somewhat diseased.  There was then two-vessel runoff but a high-grade stenosis was seen in the proximal portion of the anterior tibial artery.  The posterior tibial artery appeared to be continuous into the foot.  I was able to cross the occlusion with minimal difficulty with an advantage wire and a Kumpe catheter and then cross the AT stenosis with a V 18 wire without difficulty.  I then treated the anterior tibial artery with a 3 mm diameter by 10 cm length angioplasty balloon inflated to 8 atm for 1 minute.  The SFA occlusion was addressed with a 6 mm diameter by 25 cm length Viabahn stent taken down to the above-knee popliteal artery.  This was postdilated with a 5 mm balloon and  then a 4 mm balloon just below the stent in the above-knee popliteal artery was inflated to 12 atm for 1 minute.  Completion imaging showed markedly improved flow with less than 10% residual stenosis in the SFA and popliteal artery.  There is still some spasm in the anterior tibial artery but the residual stenosis appear to be in the 25 to 30% range.  The peroneal and posterior tibial arteries also lit up and flowed well at this point. Dr. Delana Meyer then flipped his 26 French sheath in an antegrade fashion on the left and performed imaging showing an 80 to 90% stenosis in the mid to distal SFA which was highly calcific.  The popliteal artery then normalized and there was two-vessel runoff distally.  He crossed the lesion without difficulty with a V 18 wire and performed stent placement with a 6 mm diameter by 15 cm length Viabahn stent postdilated with a 5 mm balloon with excellent angiographic completion result and less than 10% residual stenosis in  the left SFA.  Both sheaths were then removed and we turned our attention to closing the arteriotomies.  Additional heparin was given and loose flecks were all removed from the arteriotomies.  The vessels were copiously flushed with heparinized saline. The left femoral arteriotomy was addressed first.  The Cormatrix patcth is then selected and prepared for a patch angioplasty.  It is cut and beveled and started at the proximal endpoint with a 6-0 Prolene suture.  Approximately one half of the suture line is run medially and laterally and the distal end point was cut and bevelled to match the arteriotomy.  A second 6-0 Prolene was started at the distal end point and run to the mid portion to complete the arteriotomy.  The vessel was flushed prior to release of control and completion of the anastomosis.  At this point, flow was established first to the profunda femoris artery and then to the superficial femoral artery.  Initially, there is not good backbleeding so we  passed 3 Fogarty embolectomy balloons down both the SFA as well as the profunda femoris artery.  This restored backbleeding although no obvious thrombus was removed from either vessel.  Easily palpable pulses are noted well beyond the anastomosis and both arteries. At this point, we completed the right sided arteriotomy.  The Cormatrix extracellular patch was then brought onto the field.  It is cut and beveled and started at the proximal endpoint with a 6-0 Prolene suture.  Approximately one half of the suture line is run medially and laterally and the distal end point was cut and bevelled to match the arteriotomy.  A second 6-0 Prolene was started at the distal end point and run to the mid portion to complete the arteriotomy.   Flushing maneuvers were performed and flow was reestablished to the femoral vessels. Excellent pulses noted in the right superficial femoral and profunda femoris artery below the femoral anastomosis.  There was a rent in the profunda femoris artery main branch which was repaired with a series of interrupted 6-0 Prolene sutures with a pledget with adequate hemostasis achieved.  Surgicel and Vistacel topical hemostatic agents were placed in the femoral incisions and hemostasis was complete. The femoral incisions were then closed in a layered fashion with 2 layers of 2-0 Vicryl, 2 layers of 3-0 Vicryl, and skin staples with an incisional VAC for the skin closure. Dermabond and sterile dressing were then placed over all incisions.  The patient was then awakened from anesthesia and taken to the recovery room in stable condition having tolerated the procedure well.  COMPLICATIONS: None  CONDITION: Stable     Leotis Pain 11/28/2019 5:58 PM  This note was created with Dragon Medical transcription system. Any errors in dictation are purely unintentional.

## 2019-11-28 NOTE — Progress Notes (Signed)
Pt received another dose of labetalol of 5mg    Blood pressure 143/94

## 2019-11-28 NOTE — Transfer of Care (Signed)
Immediate Anesthesia Transfer of Care Note  Patient: Brendan Williams  Procedure(s) Performed: ENDARTERECTOMY FEMORAL (Bilateral ) INSERTION CENTRAL LINE ADULT APPLICATION OF WOUND VAC (Bilateral )  Patient Location: PACU  Anesthesia Type:General  Level of Consciousness: drowsy  Airway & Oxygen Therapy: Patient Spontanous Breathing and Patient connected to face mask oxygen  Post-op Assessment: Report given to RN and Post -op Vital signs reviewed and stable  Post vital signs: Reviewed and stable  Last Vitals:  Vitals Value Taken Time  BP 125/90 11/28/19 1755  Temp    Pulse 73 11/28/19 1800  Resp 14 11/28/19 1800  SpO2 100 % 11/28/19 1800  Vitals shown include unvalidated device data.  Last Pain:  Vitals:   11/28/19 0947  TempSrc:   PainSc: Asleep      Patients Stated Pain Goal: 0 (11/27/19 2059)  Complications: No apparent anesthesia complications

## 2019-11-28 NOTE — Op Note (Signed)
OPERATIVE NOTE   PROCEDURE: 1. Bilateral common femoral, superficial femoral and profunda femoris endarterectomy with Cormatrix patch angioplasty 2. Fogarty embolectomy of the right common and external iliac artery using a #4 Fogarty balloon catheter 3. Fogarty embolectomy of the left common iliac and external iliac arteries using a #3 and a #4 Fogarty balloon catheter. 4. Catheter placement into the aorta from both right common femoral and left common femoral approach 5. Aortogram with bilateral lower extremity angiogram 6. Placement of bilateral common iliac artery stents kissing balloon technique using a 9 x 58 lifestream stent initially but later in the case extending proximally with a 12 x 58 lifestream stent bilaterally. 7. Placement of bilateral 9 mm diameter Viabahn stents in the right and left external iliac arteries. 8. Stent placement right SFA with a 6 mm x 25 cm length Viabahn stent 9. Angioplasty right anterior tibial artery to 3 mm 10. 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  ASSISTANT(S): None  ANESTHESIA: general  ESTIMATED BLOOD LOSS: 1000 cc  FINDING(S): 1. Profound calcific plaque noted bilaterally extending past the initial bifurcation of the profunda femoris arteries as well as down the extensive length of the SFA  SPECIMEN(S):  Calcific plaque from the common femoral, superficial femoral and the profunda femoris arteries bilaterally  INDICATIONS:   Brendan Williams 59 y.o. y.o.male who presents with complaints of lifestyle limiting claudication and pain continuously in the feet bilaterally. The patient has documented  severe atherosclerotic occlusive disease and has undergone multiple minimally invasive treatments in the past. However, at this point his primary area of stricture stenosis resides in the common femoral and origins of the superficial femoral and profunda femoris extending into these arteries and therefore this is not amenable to intervention and he is now undergoing open endarterectomy. The risks and benefits of been reviewed with the patient, all questions have answered; alternative therapies have been reviewed as well and the patient has agreed to proceed with surgical open repair.  DESCRIPTION: After obtaining full informed written consent, the patient was brought back to the operating room and placed supine upon the operating table.  The patient received IV antibiotics prior to induction.  After obtaining adequate anesthesia, the patient was prepped and draped in the standard fashion for: bilateral femoral exposure.  Co-surgeons are required because this is a bilateral procedure with work being performed simultaneously from both the right femoral and left femoral approach.  This also expedite the procedure making a shorter operative time reducing complications and improving patient safety.  Attention was turned to the bilateral groin with Brendan Williams working on the right and myself working on the left of the patient.  Vertical  incisions were made over the common femoral artery and dissected down to the common femoral artery with electrocautery.  I dissected out the common femoral artery from the distal external iliac artery (identified by the superficial circumflex vessels) down to the femoral bifurcation.  On initial inspection, the common femoral artery was: Densely calcified and there was no palpable pulse noted bilaterally.    Subsequently the dissection was continued to include all circumflex branches and the profunda femoral artery and superficial femoral artery. The superficial femoral artery was  dissected circumferentially for a distance of approximately 3-4 cm and the profunda femoris was dissected circumferentially out to the fourth order branches individual vessel loops were placed around each branch. Both of the groins were treated simultaneously as described above. Control of all branches was obtained with vessel loops.  A softer area in the distal external iliac artery amendable to clamping was identified.    The patient was given 5000 units of Heparin intravenously, which was a therapeutic bolus.   After waiting 3 minutes, the distal external iliac artery was clamped and placed all circumflex branches, and the profunda and superficial femoral arteries under tension.  Arteriotomy was made in the common femoral artery with a 11-blade and extended it with a Potts scissor proximally and distally extending the distal end down the SFA for approximately 3 to 4 cm.   Endarterectomy was then performed under direct visualization using a freer elevator and a right angle from the mid common femoral extending up both proximally and distally. Proximally the endarterectomy was brought up to the level of the clamp where a clean edge was obtained. Distally the endarterectomy was carried down to a soft spot in the SFA where a feathered edge would was obtained.  7-0 Prolene interrupted tacking sutures were placed to secure the leading edge of the plaque in the SFA.  Both femoral artery systems were treated in this same fashion beginning on the left and then performing endarterectomy on the right  The profunda femoris was treated with an eversion technique extending endarterectomy approximately 2 cm distally again obtaining a featheredge on both sides again treating the left first and then the right..  A #4 Fogarty embolectomy catheter was then opened onto the field and beginning on the right side multiple passes were made from the common femoral extending up into the aorta and then withdrawing the catheter.   Initial passes did return a significant amount of thrombotic material.  Once the past was clear we turned our attention to thromboembolectomy of the left common and external iliac arteries.  Again multiple passes were made until there was no return of thrombotic material.  But the initial passes did return significant amount of thrombus.  This was collected and sent as specimen.  21 [place] sheaths were then introduced into the external iliac arteries under direct visualization in a retrograde fashion through the femoral arteriotomies.  Initially 9 mm x 58 mm lifestream stents were advanced up both sides and positioned raising the aortic bifurcation by about a centimeter and a half.  There deployed without difficulty.  Following this 9 mm x 38 mm lifestream stents were extended on both sides again simultaneous inflation was performed in both of these deployments with maximal atmospheric inflation of 12 atm for approximately 30 seconds.  Hand-injection of contrast was then performed and a 9 mm x 75 mm Viabahn stent was deployed on the right side extending the treated segment across the entire external iliac down to the circumflex branches.  On the left we selected a 9 mm x 100 mm Viabahn stent again deployed extending down to just above the circumflex branches.  Both of these Viabahn stents were postdilated with 8 mm balloons.  Follow-up imaging demonstrated thrombus in the common iliac within the stent and a #3 Fogarty balloon catheter was advanced up the left side and the thrombus was extracted.  Follow-up imaging appeared to show problems at the proximal edge of the lifestream stents in the aorta and we selected 12 mm x 58  mm lifestream stents and again in a kissing technique advanced both several more centimeters into the aorta raising the bifurcation but also keeping the distal portions of the stents well within the common iliac bilaterally.  They were deployed simultaneously to 12 atm for approximately 30  seconds.  Pigtail catheter was then reintroduced up the right side and bolus injection of contrast demonstrated wide patency of the aorta resolution of the thrombus within the left side as well as resolution of the lesion proximally.  There is now full expansion of both stents with wide patency of the distal aorta common iliacs and external iliac arteries bilaterally.  Given this finding we turned our attention to treating the right lower extremity initially.  The 8 French sheath was removed from the retrograde orientation and inserted antegrade down the SFA.  Using a combination of hand-injection as well as injection through a Kumpe catheter angiography was performed.  Occlusion of the SFA was noted with reconstitution of the distal popliteal.  High-grade greater than 80% stenosis was noted in the anterior tibial.  A 6 mm x 25 cm length Viabahn stent was deployed across the occluded segment of the SFA and popliteal and then postdilated with a 5 mm balloon.  A 3 mm balloon was then advanced across the anterior tibial lesion inflation was performed.  Follow-up imaging through the sheath now demonstrated wide patency of the SFA wide patency of the SFA popliteal stent there is less than 5% residual stenosis.  There is now wide patency of the anterior tibial with less than 5% residual stenosis.  Attention was then turned to the left lower extremity where the sheath was removed from the retrograde direction and reinserted down the SFA in an antegrade direction.  Hand-injection of contrast was then performed demonstrating a greater than 90% stenosis in the SFA and proximal popliteal at Hunter's canal.  There is two-vessel runoff to the foot identified.  V 18 wire was then advanced across the 90% stenosis and a 6 mm x 15 cm Viabahn stent was deployed and postdilated with a 5 mm balloon.  Inflation was to 14 atm for 30 seconds.  Follow-up imaging now demonstrates less than 5% residual stenosis with preservation of the  distal runoff.  At this point both sheaths were removed and both SFA's were irrigated with copious heparinized saline and then controlled with Silastic Vesseloops.  At this point, I fashioned a core matrix patch for the geometry of the arteriotomy.  The patch was sewn to the artery with 2 running stitches of 6-0 Prolene, running from each end.  Prior to completing the patch angioplasty, the profunda femoral artery was flushed as was the superficial femoral artery. The system was then forward flushed. The endarterectomy site was then irrigated copiously with heparinized saline.  On the left backbleeding from the SFA and the profunda was poor in the #3 Fogarty embolectomy catheter was passed down both the SFA and the popliteal.  A single pass was made down each artery no thrombus was noted but backbleeding was quite brisk.  Both were irrigated with heparinized saline and the CorMatrix patch suture line was completed without difficulty.  On the right backbleeding was noted to be excellent and the suture line was completed in the usual fashion.  Small arteriotomy was noted in the profunda femoris on the right and this was easily controlled with interrupted 6-0 Prolene suture.  Flow was then reestablished first to the profunda femoris and then the superficial femoral artery. Any gaps  or bleeding sites in the suture line were easily controlled with a 6-0 Prolene suture. Doppler is then delivered onto the field and the SFA as well as the profunda femoris arteries were interrogated and found to have triphasic Doppler signals.  Both right and left groins were then irrigated copiously with sterile saline and subsequently Vistaseal and Surgicel were placed in the wound. The incision was repaired with a double layer of 2-0 Vicryl, a double layer of 3-0 Vicryl, and a layer of 4-0 Monocryl in a subcuticular fashion.  The skin was cleaned, dried, and reinforced with Dermabond.  COMPLICATIONS: None  CONDITION:  Brendan Williams, M.D. [facility] Office: [phone]  11/28/2019, 6:46 PM

## 2019-11-29 ENCOUNTER — Inpatient Hospital Stay: Payer: Self-pay

## 2019-11-29 LAB — RESPIRATORY PANEL BY PCR

## 2019-11-29 LAB — BPAM RBC
Blood Product Expiration Date: 202103192359
Blood Product Expiration Date: 202103192359
Unit Type and Rh: 5100
Unit Type and Rh: 5100

## 2019-11-29 LAB — CBC
HCT: 32.7 % — ABNORMAL LOW (ref 39.0–52.0)
Hemoglobin: 10.8 g/dL — ABNORMAL LOW (ref 13.0–17.0)
MCH: 30.4 pg (ref 26.0–34.0)
MCHC: 33 g/dL (ref 30.0–36.0)
MCV: 92.1 fL (ref 80.0–100.0)
Platelets: 345 10*3/uL (ref 150–400)
RBC: 3.55 MIL/uL — ABNORMAL LOW (ref 4.22–5.81)
RDW: 13.2 % (ref 11.5–15.5)
WBC: 15.3 10*3/uL — ABNORMAL HIGH (ref 4.0–10.5)
nRBC: 0 % (ref 0.0–0.2)

## 2019-11-29 LAB — TYPE AND SCREEN
ABO/RH(D): B POS
Antibody Screen: NEGATIVE
Unit division: 0
Unit division: 0

## 2019-11-29 LAB — EXPECTORATED SPUTUM ASSESSMENT W GRAM STAIN, RFLX TO RESP C

## 2019-11-29 LAB — BASIC METABOLIC PANEL
Anion gap: 10 (ref 5–15)
BUN: 9 mg/dL (ref 6–20)
CO2: 24 mmol/L (ref 22–32)
Calcium: 8.4 mg/dL — ABNORMAL LOW (ref 8.9–10.3)
Chloride: 102 mmol/L (ref 98–111)
Creatinine, Ser: 0.78 mg/dL (ref 0.61–1.24)
GFR calc Af Amer: >60 mL/min (ref >60)
GFR calc non Af Amer: >60 mL/min (ref >60)
Glucose, Bld: 130 mg/dL — ABNORMAL HIGH (ref 70–99)
Potassium: 4.1 mmol/L (ref 3.5–5.1)
Sodium: 136 mmol/L (ref 135–145)

## 2019-11-29 LAB — MAGNESIUM: Magnesium: 1.8 mg/dL (ref 1.7–2.4)

## 2019-11-29 LAB — PROCALCITONIN: Procalcitonin: 0.11 ng/mL

## 2019-11-29 MED ORDER — THIAMINE HCL 100 MG/ML IJ SOLN
Freq: Once | INTRAVENOUS | Status: AC
  Filled 2019-11-29: qty 1000

## 2019-11-29 MED ORDER — VANCOMYCIN HCL 1250 MG/250ML IV SOLN
1250.0000 mg | Freq: Two times a day (BID) | INTRAVENOUS | Status: DC
  Administered 2019-11-29 – 2019-12-04 (×10): 1250 mg via INTRAVENOUS
  Filled 2019-11-29 (×11): qty 250

## 2019-11-29 MED ORDER — VANCOMYCIN HCL 1750 MG/350ML IV SOLN
1750.0000 mg | Freq: Once | INTRAVENOUS | Status: AC
  Administered 2019-11-29: 13:00:00 1750 mg via INTRAVENOUS
  Filled 2019-11-29: qty 350

## 2019-11-29 MED ORDER — PIPERACILLIN-TAZOBACTAM 3.375 G IVPB
3.3750 g | Freq: Three times a day (TID) | INTRAVENOUS | Status: DC
  Administered 2019-11-29 – 2019-12-02 (×9): 3.375 g via INTRAVENOUS
  Filled 2019-11-29 (×9): qty 50

## 2019-11-29 MED ORDER — SODIUM CHLORIDE 0.9 % IV SOLN
INTRAVENOUS | Status: DC | PRN
  Administered 2019-11-29: 23:00:00 250 mL via INTRAVENOUS
  Administered 2019-11-29: 15:00:00 1000 mL via INTRAVENOUS
  Administered 2019-12-01 (×2): 250 mL via INTRAVENOUS

## 2019-11-29 MED ORDER — CLONIDINE HCL 0.1 MG PO TABS
0.1000 mg | ORAL_TABLET | Freq: Three times a day (TID) | ORAL | Status: DC
  Administered 2019-11-29 – 2019-12-01 (×7): 0.1 mg via ORAL
  Filled 2019-11-29 (×8): qty 1

## 2019-11-29 NOTE — Progress Notes (Signed)
Shift summary:  - Febrile, tachycardic, tachypneic, and rigoring this evening (see MAR for treatment). - Mentation improving throughout this shift. - Red streaking from RLE; Vascular surgery aware.

## 2019-11-29 NOTE — Evaluation (Addendum)
Physical Therapy Evaluation Patient Details Name: Brendan Williams MRN: [id number] DOB: 07-Sep-1961 Today's Date: 11/29/2019   History of Present Illness  59 year old with a history of alcoholism, lifelong smoking, GERD does not follow with primary care doctor, came in with problems associated with his severe peripheral artery disease namely SFA and common femoral occlusion status post aorto iliac surgery on January 2021 when he was noted to have critical limb ischemia of his toes.  S/p b/l endartectomies/stent placement surgeries 2/12.  WBAT in b/l LEs per vascular sx.  Clinical Impression  Pt sleepy, but willing to participate with PT.  He showed good effort but was very weak and functionally limited.  Pt had some tremors in UEs with most in bed testing, but ataxia and tremoring became very apparent and exaggerated with both standing/walking attempts.  Ultimately he was unable to maintain standing for any significant amount of time and was highly reliant on the walker to maintain upright.  Pt was hoping to have done better and still hopes to go home but realizes that at this point is is far too weak and unsteady to be able to safely go home.      Follow Up Recommendations SNF(pt hoping to improve enough to go home)    Equipment Recommendations  None recommended by PT    Recommendations for Other Services       Precautions / Restrictions Precautions Precautions: Fall Restrictions Weight Bearing Restrictions: Yes RLE Weight Bearing: Weight bearing as tolerated LLE Weight Bearing: Weight bearing as tolerated      Mobility  Bed Mobility Overal bed mobility: Needs Assistance Bed Mobility: Supine to Sit;Sit to Supine     Supine to sit: Min assist Sit to supine: Min assist   General bed mobility comments: Pt showed very good effort and determined to do transitions on his own, ultimately needed heavy UE use and extra time, only light actual phyiscal assist  Transfers Overall transfer  level: Needs assistance Equipment used: Rolling walker (2 wheeled) Transfers: Sit to/from Stand Sit to Stand: Min assist         General transfer comment: Pt very unsteady with rising to standing, tremors in buckling in knees; able to keep himself upright at EOB with heavy UE use on walker and assist from PT  Ambulation/Gait Ambulation/Gait assistance: Min assist;Mod assist Gait Distance (Feet): 2 Feet Assistive device: Rolling walker (2 wheeled)       General Gait Details: Pt was able to take 2 steps forward with each foot, but was unsteady with shaking/tremors and inability to control walker safely.  Overall he showed great effort but simply was too weak and tremoring to be able to attempt actual ambulation.  Stairs            Wheelchair Mobility    Modified Rankin (Stroke Patients Only)       Balance                                             Pertinent Vitals/Pain Pain Assessment: 0-10 Pain Score: 4  Pain Location: b/l feet/legs    Home Living Family/patient expects to be discharged to:: Private residence Living Arrangements: Spouse/significant other;Other relatives Available Help at Discharge: Family;Available 24 hours/day   Home Access: Stairs to enter Entrance Stairs-Rails: ("yes") Entrance Stairs-Number of Steps: 3 Home Layout: One level Home Equipment: Walker - 2 wheels;Cane - single  point      Prior Function Level of Independence: Independent         Comments: Pt reports that he does not drive, but can be out in the community regularly, does not use ADs     Hand Dominance        Extremity/Trunk Assessment   Upper Extremity Assessment Upper Extremity Assessment: Generalized weakness(limited due to tremors/lines but functional)    Lower Extremity Assessment Lower Extremity Assessment: Generalized weakness(limited testing secondary to b/l LE sx/pain)       Communication   Communication: No difficulties   Cognition Arousal/Alertness: Awake/alert Behavior During Therapy: Restless(with with tremors/DTs) Overall Cognitive Status: Within Functional Limits for tasks assessed                                        General Comments  Pt's HR jumped to ~130 with both standing attempts, generally ~100 with supine and seated tasks    Exercises     Assessment/Plan    PT Assessment Patient needs continued PT services  PT Problem List Decreased strength;Decreased range of motion;Decreased balance;Decreased activity tolerance;Decreased mobility;Decreased knowledge of use of DME;Decreased safety awareness;Cardiopulmonary status limiting activity       PT Treatment Interventions DME instruction;Gait training;Functional mobility training;Therapeutic activities;Therapeutic exercise;Balance training;Stair training;Neuromuscular re-education;Patient/family education    PT Goals (Current goals can be found in the Care Plan section)  Acute Rehab PT Goals Patient Stated Goal: go home PT Goal Formulation: With patient Time For Goal Achievement: 12/13/19 Potential to Achieve Goals: Fair    Frequency Min 2X/week   Barriers to discharge        Co-evaluation               AM-PAC PT "6 Clicks" Mobility  Outcome Measure Help needed turning from your back to your side while in a flat bed without using bedrails?: A Little Help needed moving from lying on your back to sitting on the side of a flat bed without using bedrails?: A Little Help needed moving to and from a bed to a chair (including a wheelchair)?: A Lot Help needed standing up from a chair using your arms (e.g., wheelchair or bedside chair)?: A Lot Help needed to walk in hospital room?: Total Help needed climbing 3-5 steps with a railing? : Total 6 Click Score: 12    End of Session Equipment Utilized During Treatment: Gait belt;Oxygen Activity Tolerance: Other (comment);Patient limited by fatigue(tremors likely related  to detox) Patient left: in bed;with call bell/phone within reach;with nursing/sitter in room Nurse Communication: Mobility status PT Visit Diagnosis: Muscle weakness (generalized) (M62.81);Difficulty in walking, not elsewhere classified (R26.2);Unsteadiness on feet (R26.81);Ataxic gait (R26.0)    Time: 1610-9604 PT Time Calculation (min) (ACUTE ONLY): 33 min   Charges:   PT Evaluation $PT Eval Low Complexity: 1 Low PT Treatments $Therapeutic Activity: 8-22 mins        Kreg Shropshire, DPT 11/29/2019, 4:45 PM

## 2019-11-29 NOTE — Consult Note (Signed)
Pharmacy Antibiotic Note  Brendan Williams is a 59 y.o. male admitted on 11/25/2019 with sepsis.  Pharmacy has been consulted for pip/tazo and vancomycin dosing.  Plan: Zosyn 3.375g IV q8h (4 hour infusion).  Will give a vancomycin 1750 mg x 1 loading dose, followed by vancomycin 1250 mg q12H maintenance dose. Predicted AUC 499 and goal AUC is 400-550. Scr used 0.8.   Height: 6' (182.9 cm) Weight: 166 lb 0.1 oz (75.3 kg) IBW/kg (Calculated) : 77.6  Temp (24hrs), Avg:98.5 F (36.9 C), Min:97.4 F (36.3 C), Max:100.6 F (38.1 C)  Recent Labs  Lab 11/25/19 1418 11/26/19 0504 11/27/19 0411 11/28/19 0752 11/29/19 0517  WBC 11.1* 9.3 10.1 10.8* 15.3*  CREATININE 0.76 0.70 0.69 0.69 0.78    Estimated Creatinine Clearance: 105.9 mL/min (by C-G formula based on SCr of 0.78 mg/dL).    No Known Allergies  Antimicrobials this admission: 2/13 pip/tazo >>  2/13 vancomycin >>   Dose adjustments this admission: None  Microbiology results:  2/9 MRSA PCR: negative  Thank you for allowing pharmacy to be a part of this patient's care.  Ronnald Ramp, PharmD, BCPS 11/29/2019 11:13 AM

## 2019-11-29 NOTE — Progress Notes (Signed)
OT Cancellation Note  Patient Details Name: Brendan Williams MRN: [id number] DOB: 01-18-1961   Cancelled Treatment:    Reason Eval/Treat Not Completed: Fatigue/lethargy limiting ability to participate. Thank you for the OT consult. Order received and chart reviewed. This Thereasa Parkin spoke with pt primary physical therapist who indicated pt was sleeping soundly this am and nsg staff reports pt has been confused overnight. Will hold OT eval until a later date/time when pt awake/alert and able to participate in therapy session.   Rockney Ghee, M.S., OTR/L Ascom: [phone] 11/29/19, 9:07 AM

## 2019-11-29 NOTE — Progress Notes (Signed)
[facility] Progress Note   Date of Surgery: 11/28/2019 Procedures:  Procedure(s): ENDARTERECTOMY FEMORAL INSERTION CENTRAL LINE ADULT APPLICATION OF WOUND VAC Surgeon: Surgeon(s): Dew, Marlow Baars, MD Schnier, Latina Craver, MD  1 Day Post-Op  History of Present Illness  Brendan Williams is a 59 y.o. male who is 1 Day Post-Op.  He is reported to be quite agitated and suspected to be going thru withdrawals. He has been sleeping .  Hemodynamically stable. Does not engage in converstaion VASC. LAB Studies:      Imaging: VASCULAR C-ARM IMAGES ONLY ([facility] IR)  Result Date: 11/28/2019 Please refer to notes tab for details about interventional procedure. (Op Note)   Significant Diagnostic Studies: CBC Lab Results  Component Value Date   WBC 15.3 (H) 11/29/2019   HGB 10.8 (L) 11/29/2019   HCT 32.7 (L) 11/29/2019   MCV 92.1 11/29/2019   PLT 345 11/29/2019    BMET    Component Value Date/Time   NA 136 11/29/2019 0517   K 4.1 11/29/2019 0517   CL 102 11/29/2019 0517   CO2 24 11/29/2019 0517   GLUCOSE 130 (H) 11/29/2019 0517   BUN 9 11/29/2019 0517   CREATININE 0.78 11/29/2019 0517   CALCIUM 8.4 (L) 11/29/2019 0517   GFRNONAA >60 11/29/2019 0517   GFRAA >60 11/29/2019 0517    COAG Lab Results  Component Value Date   INR 0.9 11/28/2019   INR 0.9 11/25/2019   No results found for: PTT  Physical Examination  BP Readings from Last 3 Encounters:  11/29/19 (!) 147/78  11/25/19 (!) 147/87  11/14/19 116/86   Temp Readings from Last 3 Encounters:  11/29/19 (!) 100.6 F (38.1 C) (Axillary)  11/14/19 98.3 F (36.8 C) (Oral)   @LASTSAO2 (3)@ Pulse Readings from Last 3 Encounters:  11/29/19 92  11/25/19 68  11/14/19 63    Pt is A&O x 3 Incisions clean and dry. vacs in place. Feet are warm. Biphasic DP and PT bil. Right foot with red streaks to mid calf. No crepitus. Starts at base of cyanotic fith toe.  Assessment/Plan: Tem 100.6 this  morning S/p bil femoral endarterectomies and stentingh. Alcohol withdrawal protocol initiated. ICU team consulted.  Continue eliquis. If able to take PO Will get blood culture and chest xray in view of fever. Incentive spirometry    11/16/19, MD  11/29/2019 9:29 AM

## 2019-11-29 NOTE — Anesthesia Postprocedure Evaluation (Signed)
Anesthesia Post Note  Patient: Brendan Williams  Procedure(s) Performed: ENDARTERECTOMY FEMORAL (Bilateral ) INSERTION CENTRAL LINE ADULT APPLICATION OF WOUND VAC (Bilateral )  Patient location during evaluation: SICU Anesthesia Type: General Level of consciousness: awake, patient cooperative and oriented (appears to be in withdrawl) Pain management: pain level controlled Vital Signs Assessment: post-procedure vital signs reviewed and stable Respiratory status: spontaneous breathing, nonlabored ventilation and respiratory function stable Cardiovascular status: stable Postop Assessment: no apparent nausea or vomiting Anesthetic complications: no Comments: Has been getting Ativan per CIWA protocol and he was noted to have some red streaking around the leg, so was placed on vanc and zosyn.  Otherwise patient is doing well without anesthetic complications.     Last Vitals:  Vitals:   11/29/19 1400 11/29/19 1500  BP: (!) 171/91 (!) 143/86  Pulse:  98  Resp: (!) 21 17  Temp:    SpO2:  100%    Last Pain:  Vitals:   11/29/19 1200  TempSrc: Axillary  PainSc:                  Lenard Simmer

## 2019-11-29 NOTE — Care Plan (Signed)
Patient re-evaluated at bedside.  He is febrile 101.6, tachypneic RR26-34, diaphoretic with chills and admits to withdrawal symptoms.  Administered clonidine 0.1 TID with 1 dose now, goal to decrease BP by 25%, Tylenol 650 q6h PNR, Ativan per CIWA protocol.    Vida Rigger, M.D.  Pulmonary & Critical Care Medicine  [facility]

## 2019-11-29 NOTE — Progress Notes (Signed)
PT Cancellation Note  Patient Details Name: Brendan Williams MRN: [id number] DOB: Feb 07, 1961   Cancelled Treatment:    Reason Eval/Treat Not Completed: Patient's level of consciousness Spoke with nurse who reports that pt is delirious (likely going through withdrawal) and not appropriate for PT this AM, will check back later and eval as appropriate.  Malachi Pro, DPT 11/29/2019, 10:52 AM

## 2019-11-29 NOTE — Consult Note (Signed)
CRITICAL CARE PROGRESS NOTE    Name: Brendan Williams MRN: [id number] DOB: Jul 08, 1961     LOS: 4   SUBJECTIVE FINDINGS & SIGNIFICANT EVENTS   Patient description:  This is a 59 year old with a history of alcoholism, lifelong smoking, GERD does not follow with primary care doctor, came in with problems associated with his severe peripheral artery disease namely SFA and common femoral occlusion status post aorto iliac surgery on January 2021 when he was noted to have critical limb ischemia of his toes.  He has a total of 41 pack years of smoking.  Lines / Drains: PIVx2  Cultures / Sepsis markers: bloodx2  Antibiotics: Vanc/zosyn empirically   Protocols / Consultants: Pccm/vasc/cards     PAST MEDICAL HISTORY   Past Medical History:  Diagnosis Date  . Peripheral arterial disease (HCC)      SURGICAL HISTORY   Past Surgical History:  Procedure Laterality Date  . BACK SURGERY     lower ruptured disk  . LOWER EXTREMITY ANGIOGRAPHY Right 11/14/2019   Procedure: LOWER EXTREMITY ANGIOGRAPHY;  Surgeon: Annice Needy, MD;  Location: [facility] INVASIVE CV LAB;  Service: Cardiovascular;  Laterality: Right;  . STOMACH SURGERY    . TONSILECTOMY, ADENOIDECTOMY, BILATERAL MYRINGOTOMY AND TUBES       FAMILY HISTORY   Family History  Problem Relation Age of Onset  . Leukemia Mother   . Heart disease Father        a. CABG  . Heart disease Sister        a. MVR  . Cancer Sister   . Cancer Brother   . CAD Brother        a. CABG     SOCIAL HISTORY   Social History   Tobacco Use  . Smoking status: Current Every Day Smoker    Packs/day: 1.00    Types: Cigarettes  . Smokeless tobacco: Never Used  Substance Use Topics  . Alcohol use: Yes    Alcohol/week: 5.0 standard drinks    Types: 5 Cans of beer per  week    Comment: 4-5 beers a week  . Drug use: Yes    Frequency: 3.0 times per week    Types: Marijuana    Comment: 3-4 times a week     MEDICATIONS   Current Medication:  Current Facility-Administered Medications:  .  0.9 %  sodium chloride infusion, , Intravenous, Continuous, Dew, Marlow Baars, MD, Last Rate: 75 mL/hr at 11/29/19 0816, New Bag at 11/29/19 0816 .  acetaminophen (TYLENOL) tablet 650 mg, 650 mg, Oral, Q6H PRN **OR** acetaminophen (TYLENOL) suppository 650 mg, 650 mg, Rectal, Q6H PRN, Dew, Marlow Baars, MD .  apixaban (ELIQUIS) tablet 5 mg, 5 mg, Oral, BID, Dew, Marlow Baars, MD, 5 mg at 11/29/19 1026 .  aspirin EC tablet 81 mg, 81 mg, Oral, Daily, Annice Needy, MD, 81 mg at 11/29/19 1026 .  atorvastatin (LIPITOR) tablet 10 mg, 10 mg, Oral, q1800, Annice Needy, MD, 10 mg at 11/27/19 1835 .  Chlorhexidine Gluconate Cloth 2 % PADS 6 each, 6 each, Topical, Daily, Dew, Marlow Baars, MD, 6 each at 11/28/19 1955 .  dexmedetomidine (PRECEDEX) 400 MCG/100ML (4 mcg/mL) infusion, 0.2-0.7 mcg/kg/hr, Intravenous, Continuous, Griffith Citron, MD, Stopped at 11/28/19 2127 .  feeding supplement (ENSURE ENLIVE) (ENSURE ENLIVE) liquid 237 mL, 237 mL, Oral, TID BM, Dew, Marlow Baars, MD, 237 mL at 11/29/19 1027 .  LORazepam (ATIVAN) injection 1-2 mg, 1-2 mg, Intravenous, Q1H PRN, Griffith Citron, MD, 2  mg at 11/29/19 0610 .  morphine 2 MG/ML injection 2 mg, 2 mg, Intravenous, Q2H PRN, Annice Needy, MD, 2 mg at 11/29/19 0259 .  multivitamin with minerals tablet 1 tablet, 1 tablet, Oral, Daily, Dew, Marlow Baars, MD, 1 tablet at 11/29/19 1026 .  nicotine (NICODERM CQ - dosed in mg/24 hours) patch 21 mg, 21 mg, Transdermal, Daily, Dew, Marlow Baars, MD, 21 mg at 11/29/19 1030 .  ondansetron (ZOFRAN) tablet 4 mg, 4 mg, Oral, Q6H PRN **OR** ondansetron (ZOFRAN) injection 4 mg, 4 mg, Intravenous, Q6H PRN, Wyn Quaker, Marlow Baars, MD .  oxyCODONE (Oxy IR/ROXICODONE) immediate release tablet 10 mg, 10 mg, Oral, Q4H PRN, Annice Needy, MD, 10 mg at  11/27/19 2301 .  oxyCODONE (Oxy IR/ROXICODONE) immediate release tablet 5 mg, 5 mg, Oral, Q4H PRN, Annice Needy, MD, 5 mg at 11/28/19 1954    ALLERGIES   Patient has no known allergies.    REVIEW OF SYSTEMS    Unable to obtain ROS due to mild confusion  PHYSICAL EXAMINATION   Vital Signs: Temp:  [97.4 F (36.3 C)-100.6 F (38.1 C)] 100.6 F (38.1 C) (02/13 0800) Pulse Rate:  [63-93] 92 (02/13 0600) Resp:  [10-33] 18 (02/13 0800) BP: (109-158)/(78-110) 147/78 (02/13 0800) SpO2:  [94 %-100 %] 99 % (02/13 0800)  GENERAL:NAD HEAD: Normocephalic, atraumatic.  EYES: Pupils equal, round, reactive to light.  No scleral icterus.  MOUTH: Moist mucosal membrane. NECK: Supple. No thyromegaly. No nodules. No JVD.  PULMONARY: ctab CARDIOVASCULAR: S1 and S2. Regular rate and rhythm. No murmurs, rubs, or gallops.  GASTROINTESTINAL: Soft, nontender, non-distended. No masses. Positive bowel sounds. No hepatosplenomegaly.  MUSCULOSKELETAL: No swelling, clubbing, or edema. Tender erythematous right foot.  NEUROLOGIC: Mild distress due to acute illness SKIN:intact,warm,dry   PERTINENT DATA     Infusions: . sodium chloride 75 mL/hr at 11/29/19 0816  . dexmedetomidine (PRECEDEX) IV infusion Stopped (11/28/19 2127)   Scheduled Medications: . apixaban  5 mg Oral BID  . aspirin EC  81 mg Oral Daily  . atorvastatin  10 mg Oral q1800  . Chlorhexidine Gluconate Cloth  6 each Topical Daily  . feeding supplement (ENSURE ENLIVE)  237 mL Oral TID BM  . multivitamin with minerals  1 tablet Oral Daily  . nicotine  21 mg Transdermal Daily   PRN Medications: acetaminophen **OR** acetaminophen, LORazepam, morphine injection, ondansetron **OR** ondansetron (ZOFRAN) IV, oxyCODONE, oxyCODONE Hemodynamic parameters:   Intake/Output: 02/12 0701 - 02/13 0700 In: 3750 [I.V.:3700; IV Piggyback:50] Out: 2700 [Urine:1700; Blood:1000]  Ventilator  Settings:       LAB RESULTS:  Basic  Metabolic Panel: Recent Labs  Lab 11/25/19 1418 11/25/19 1418 11/26/19 0504 11/26/19 0504 11/27/19 0411 11/27/19 0411 11/28/19 0752 11/29/19 0517  NA 135  --  138  --  137  --  136 136  K 4.1   < > 4.0   < > 4.0   < > 4.0 4.1  CL 101  --  104  --  102  --  102 102  CO2 25  --  25  --  29  --  24 24  GLUCOSE 109*  --  108*  --  118*  --  111* 130*  BUN 6  --  7  --  5*  --  8 9  CREATININE 0.76  --  0.70  --  0.69  --  0.69 0.78  CALCIUM 9.1  --  8.9  --  9.2  --  9.1 8.4*  MG 1.9  --  2.1  --  1.8  --  1.9 1.8   < > = values in this interval not displayed.   Liver Function Tests: No results for input(s): AST, ALT, ALKPHOS, BILITOT, PROT, ALBUMIN in the last 168 hours. No results for input(s): LIPASE, AMYLASE in the last 168 hours. No results for input(s): AMMONIA in the last 168 hours. CBC: Recent Labs  Lab 11/25/19 1418 11/26/19 0504 11/27/19 0411 11/28/19 0752 11/29/19 0517  WBC 11.1* 9.3 10.1 10.8* 15.3*  HGB 13.3 13.3 13.5 13.2 10.8*  HCT 39.9 39.8 39.7 39.2 32.7*  MCV 91.3 90.7 90.8 90.5 92.1  PLT 434* 430* 415* 414* 345   Cardiac Enzymes: No results for input(s): CKTOTAL, CKMB, CKMBINDEX, TROPONINI in the last 168 hours. BNP: Invalid input(s): POCBNP CBG: Recent Labs  Lab 11/28/19 1946  GLUCAP 168*     IMAGING RESULTS:  Imaging: VASCULAR C-ARM IMAGES ONLY ([facility] IR)  Result Date: 11/28/2019 Please refer to notes tab for details about interventional procedure. (Op Note)     ASSESSMENT AND PLAN    -Multidisciplinary rounds held today  Altered mental status with confusion - due to alcohol withdrawal  - thiamine/folate repletion - CIWA protocol -follow [place] EtOH withdrawal protocol -s/p Ativan this am -patient admits to EtOH and THC abuse -monitor for DTs/ seizures  Probable right lower extremity cellulitis -patient admits to "scraping foot when he fell" -febrile with elevated WBC count today - will cover with broad spectrum  antibiotics -pharmD consult for vanc/zosyn pharmacodynamics -procalcitonin trend -MRSA pcr negative - may d/c vanco as per pharmD -ICU telemetrymonitoring    Severe PAD with critical limb ischemia - vascular team on case  -POD1 - B/L fem endarterectomy with stenting - hemodynamically stable -adequate analgesia  Tobacco abuse with probable COPD  - 14mg  nicotine replacement  - COPD workup on outpatient    ID -continue IV abx as prescibed -follow up cultures  GI/Nutrition GI PROPHYLAXIS as indicated DIET-->TF's as tolerated Constipation protocol as indicated  ENDO - ICU hypoglycemic\Hyperglycemia protocol -check FSBS per protocol   ELECTROLYTES -follow labs as needed -replace as needed -pharmacy consultation   DVT/GI PRX ordered -SCDs  TRANSFUSIONS AS NEEDED MONITOR FSBS ASSESS the need for LABS as needed   Critical care provider statement:    Critical care time (minutes):  33   Critical care time was exclusive of:  Separately billable procedures and treating other patients   Critical care was necessary to treat or prevent imminent or life-threatening deterioration of the following conditions:  critical limb ischemia, alcohol /drug withdrawal, probable sepsis, multiple comoirid conditiosn.    Critical care was time spent personally by me on the following activities:  Development of treatment plan with patient or surrogate, discussions with consultants, evaluation of patient's response to treatment, examination of patient, obtaining history from patient or surrogate, ordering and performing treatments and interventions, ordering and review of laboratory studies and re-evaluation of patient's condition.  I assumed direction of critical care for this patient from another provider in my specialty: no    This document was prepared using Dragon voice recognition software and may include unintentional dictation errors.    Ottie Glazier, M.D.  Division of [place]

## 2019-11-29 NOTE — Plan of Care (Signed)

## 2019-11-30 LAB — BASIC METABOLIC PANEL
Anion gap: 10 (ref 5–15)
BUN: 9 mg/dL (ref 6–20)
CO2: 23 mmol/L (ref 22–32)
Calcium: 8.2 mg/dL — ABNORMAL LOW (ref 8.9–10.3)
Chloride: 103 mmol/L (ref 98–111)
Creatinine, Ser: 0.83 mg/dL (ref 0.61–1.24)
GFR calc Af Amer: >60 mL/min (ref >60)
GFR calc non Af Amer: >60 mL/min (ref >60)
Glucose, Bld: 100 mg/dL — ABNORMAL HIGH (ref 70–99)
Potassium: 3.7 mmol/L (ref 3.5–5.1)
Sodium: 136 mmol/L (ref 135–145)

## 2019-11-30 LAB — CBC
HCT: 27.9 % — ABNORMAL LOW (ref 39.0–52.0)
Hemoglobin: 9.2 g/dL — ABNORMAL LOW (ref 13.0–17.0)
MCH: 30.3 pg (ref 26.0–34.0)
MCHC: 33 g/dL (ref 30.0–36.0)
MCV: 91.8 fL (ref 80.0–100.0)
Platelets: 302 10*3/uL (ref 150–400)
RBC: 3.04 MIL/uL — ABNORMAL LOW (ref 4.22–5.81)
RDW: 13.2 % (ref 11.5–15.5)
WBC: 14.2 10*3/uL — ABNORMAL HIGH (ref 4.0–10.5)
nRBC: 0 % (ref 0.0–0.2)

## 2019-11-30 LAB — PROCALCITONIN: Procalcitonin: 0.14 ng/mL

## 2019-11-30 LAB — MAGNESIUM: Magnesium: 1.8 mg/dL (ref 1.7–2.4)

## 2019-11-30 LAB — PREPARE RBC (CROSSMATCH)

## 2019-11-30 MED ORDER — LORAZEPAM 1 MG PO TABS: 1.0000 mg | ORAL_TABLET | ORAL | Status: AC | PRN

## 2019-11-30 MED ORDER — LORAZEPAM 2 MG/ML IJ SOLN: 1.0000 mg | INTRAMUSCULAR | Status: AC | PRN

## 2019-11-30 NOTE — Progress Notes (Signed)
Patient transferred room 155, report called to [facility], VSS on room air, all belongings, chart, meds, and provena supplies transferred with him.  His wifa accompanied him to room 155.  CCMD alerted to his transfer and tele box #

## 2019-11-30 NOTE — Progress Notes (Addendum)
[facility] Progress Note   Date of Surgery: 11/28/2019 Procedures:  Procedure(s): ENDARTERECTOMY FEMORAL INSERTION CENTRAL LINE ADULT APPLICATION OF WOUND VAC Surgeon: Surgeon(s): Dew, Marlow Baars, MD Schnier, Latina Craver, MD  1 Day Post-Op  History of Present Illness  Brendan Williams is a 59 y.o. male who is 1 Day Post-Op.  He is reported to be quite agitated and suspected to be going thru withdrawals. He has been sleeping . He did attempt to participate in PT but reportedly is very weak Hemodynamically stable. Still febrile 100.1 CXR with no signifixcant findings. Blood cultures pending    VASC. LAB Studies:      Imaging: DG Chest Port 1 View  Result Date: 11/29/2019 CLINICAL DATA:  Fever. S/p bilateral femoral endarterectomies and stenting. Hx of PAD, current smoker. EXAM: PORTABLE CHEST 1 VIEW COMPARISON:  Chest radiograph 11/26/2019 FINDINGS: Stable cardiomediastinal contours given technique. Low volumes with bronchovascular crowding in the lung bases. No definite new focal infiltrate. No pneumothorax or large pleural effusion. Probable old left rib fracture. IMPRESSION: Low volumes with bronchovascular crowding. No definite focal infiltrate. Electronically Signed   By: Emmaline Kluver M.D.   On: 11/29/2019 12:10   VASCULAR C-ARM IMAGES ONLY ([facility] IR)  Result Date: 11/28/2019 Please refer to notes tab for details about interventional procedure. (Op Note)   Significant Diagnostic Studies: CBC Lab Results  Component Value Date   WBC 14.2 (H) 11/30/2019   HGB 9.2 (L) 11/30/2019   HCT 27.9 (L) 11/30/2019   MCV 91.8 11/30/2019   PLT 302 11/30/2019    BMET    Component Value Date/Time   NA 136 11/30/2019 0502   K 3.7 11/30/2019 0502   CL 103 11/30/2019 0502   CO2 23 11/30/2019 0502   GLUCOSE 100 (H) 11/30/2019 0502   BUN 9 11/30/2019 0502   CREATININE 0.83 11/30/2019 0502   CALCIUM 8.2 (L) 11/30/2019 0502   GFRNONAA >60 11/30/2019 0502    GFRAA >60 11/30/2019 0502    COAG Lab Results  Component Value Date   INR 0.9 11/28/2019   INR 0.9 11/25/2019   No results found for: PTT  Physical Examination  BP Readings from Last 3 Encounters:  11/30/19 131/75  11/25/19 (!) 147/87  11/14/19 116/86   Temp Readings from Last 3 Encounters:  11/30/19 100.1 F (37.8 C) (Axillary)  11/14/19 98.3 F (36.8 C) (Oral)   @LASTSAO2 (3)@ Pulse Readings from Last 3 Encounters:  11/30/19 94  11/25/19 68  11/14/19 63    Pt is A&O x 3 Incisions clean and dry. vacs in place. Feet are warm. Biphasic DP and PT bil. Right foot with red streaks to mid calf. No crepitus. Starts at base of cyanotic fith toe.  Assessment/Plan: Tem 100.1 this morning S/p bil femoral endarterectomies and stenting Red streak in right ankle. Started on broad spectrum antibiotics . No worsening so far Alcohol withdrawal protocol initiated. ICU team consulted.  Continue eliquis. If able to take PO Fever w/u in progress with no obvious source. Possibly atelectasis Encourage Incentive spirometry    11/16/19, MD  11/30/2019 8:52 AM

## 2019-11-30 NOTE — Consult Note (Signed)
Pharmacy Antibiotic Note  Brendan Williams is a 59 y.o. male admitted on 11/25/2019 with sepsis.  Pharmacy has been consulted for pip/tazo and vancomycin dosing.  Plan: Zosyn 3.375g IV q8h (4 hour infusion).  Will give a vancomycin 1750 mg x 1 loading dose, followed by vancomycin 1250 mg q12H maintenance dose. Predicted AUC 484 and goal AUC is 400-550. Scr used 0.83.   Height: 6' (182.9 cm) Weight: 172 lb 2.9 oz (78.1 kg) IBW/kg (Calculated) : 77.6  Temp (24hrs), Avg:99.7 F (37.6 C), Min:98.8 F (37.1 C), Max:101.6 F (38.7 C)  Recent Labs  Lab 11/26/19 0504 11/27/19 0411 11/28/19 0752 11/29/19 0517 11/30/19 0502  WBC 9.3 10.1 10.8* 15.3* 14.2*  CREATININE 0.70 0.69 0.69 0.78 0.83    Estimated Creatinine Clearance: 105.2 mL/min (by C-G formula based on SCr of 0.83 mg/dL).    No Known Allergies  Antimicrobials this admission: 2/13 pip/tazo >>  2/13 vancomycin >>   Dose adjustments this admission: None  Microbiology results:  2/9 MRSA PCR: negative  Thank you for allowing pharmacy to be a part of this patient's care.  Ronnald Ramp, PharmD, BCPS 11/30/2019 11:44 AM

## 2019-11-30 NOTE — Progress Notes (Signed)
PHARMACY - PHYSICIAN COMMUNICATION CRITICAL VALUE ALERT - BLOOD CULTURE IDENTIFICATION (BCID)  No results found for this or any previous visit.  Name of physician (or Provider) Contacted:  Manuela Schwartz, NP   Changes to prescribed antibiotics required: No, will continue vanc and zosyn  Enez Monahan D 11/30/2019  8:13 PM

## 2019-11-30 NOTE — Consult Note (Signed)
CRITICAL CARE PROGRESS NOTE    Name: Brendan Williams MRN: [id number] DOB: [birth year]     LOS: 5   SUBJECTIVE FINDINGS & SIGNIFICANT EVENTS   Patient description:  This is a 59 year old with a history of alcoholism, lifelong smoking, GERD does not follow with primary care doctor, came in with problems associated with his severe peripheral artery disease namely SFA and common femoral occlusion status post aorto iliac surgery on January 2021 when he was noted to have critical limb ischemia of his toes.  He has a total of 41 pack years of smoking.  Lines / Drains: PIVx2  Cultures / Sepsis markers: bloodx2  Antibiotics: Vanc/zosyn empirically   Protocols / Consultants: Pccm/vasc/cards   11/30/19-patient has clinically improved, this morning on exam patient is nontachypneic, nondiaphoretic, nonfebrile on tylenol,hemodynamically stable, with trending down WBC count, he is lucid and nonagitated speaking calmly and appropriately.  Unbreached lines of demarcation on right lower extremity. Plan for transfer to hospitalist service on telemetry floor.  Discussed with Dr Consuello Closs vascular surgery.   PAST MEDICAL HISTORY   Past Medical History:  Diagnosis Date  . Peripheral arterial disease (HCC)      SURGICAL HISTORY   Past Surgical History:  Procedure Laterality Date  . BACK SURGERY     lower ruptured disk  . LOWER EXTREMITY ANGIOGRAPHY Right 11/14/2019   Procedure: LOWER EXTREMITY ANGIOGRAPHY;  Surgeon: Annice Needy, MD;  Location: [facility] INVASIVE CV LAB;  Service: Cardiovascular;  Laterality: Right;  . STOMACH SURGERY    . TONSILECTOMY, ADENOIDECTOMY, BILATERAL MYRINGOTOMY AND TUBES       FAMILY HISTORY   Family History  Problem Relation Age of Onset  . Leukemia Mother   . Heart disease Father        a.  CABG  . Heart disease Sister        a. MVR  . Cancer Sister   . Cancer Brother   . CAD Brother        a. CABG     SOCIAL HISTORY   Social History   Tobacco Use  . Smoking status: Current Every Day Smoker    Packs/day: 1.00    Types: Cigarettes  . Smokeless tobacco: Never Used  Substance Use Topics  . Alcohol use: Yes    Alcohol/week: 5.0 standard drinks    Types: 5 Cans of beer per week    Comment: 4-5 beers a week  . Drug use: Yes    Frequency: 3.0 times per week    Types: Marijuana    Comment: 3-4 times a week     MEDICATIONS   Current Medication:  Current Facility-Administered Medications:  .  0.9 %  sodium chloride infusion, , Intravenous, PRN, Vida Rigger, MD, Last Rate: 10 mL/hr at 11/30/19 0906, Rate Verify at 11/30/19 0906 .  acetaminophen (TYLENOL) tablet 650 mg, 650 mg, Oral, Q6H PRN, 650 mg at 11/29/19 1631 **OR** acetaminophen (TYLENOL) suppository 650 mg, 650 mg, Rectal, Q6H PRN, Wyn Quaker, Marlow Baars, MD .  apixaban (ELIQUIS) tablet 5 mg, 5 mg, Oral, BID, Dew, Marlow Baars, MD, 5 mg at 11/30/19 0907 .  aspirin EC tablet 81 mg, 81 mg, Oral, Daily, Annice Needy, MD, 81 mg at 11/30/19 0907 .  atorvastatin (LIPITOR) tablet 10 mg, 10 mg, Oral, q1800, Annice Needy, MD, 10 mg at 11/29/19 1631 .  Chlorhexidine Gluconate Cloth 2 % PADS 6 each, 6 each, Topical, Daily, Dew, Marlow Baars, MD, 6 each at 11/30/19 0908 .  cloNIDine (CATAPRES) tablet 0.1 mg, 0.1 mg, Oral, TID, Karna Christmas, Pamala Hayman, MD, 0.1 mg at 11/30/19 0907 .  dexmedetomidine (PRECEDEX) 400 MCG/100ML (4 mcg/mL) infusion, 0.2-0.7 mcg/kg/hr, Intravenous, Continuous, Griffith Citron, MD, Stopped at 11/28/19 2127 .  feeding supplement (ENSURE ENLIVE) (ENSURE ENLIVE) liquid 237 mL, 237 mL, Oral, TID BM, Annice Needy, MD, 237 mL at 11/30/19 0908 .  LORazepam (ATIVAN) injection 1-2 mg, 1-2 mg, Intravenous, Q1H PRN, Griffith Citron, MD, 2 mg at 11/29/19 2301 .  morphine 2 MG/ML injection 2 mg, 2 mg, Intravenous, Q2H PRN, Annice Needy,  MD, 2 mg at 11/29/19 2109 .  multivitamin with minerals tablet 1 tablet, 1 tablet, Oral, Daily, Dew, Marlow Baars, MD, 1 tablet at 11/30/19 0908 .  nicotine (NICODERM CQ - dosed in mg/24 hours) patch 21 mg, 21 mg, Transdermal, Daily, Dew, Marlow Baars, MD, 21 mg at 11/30/19 0908 .  ondansetron (ZOFRAN) tablet 4 mg, 4 mg, Oral, Q6H PRN **OR** ondansetron (ZOFRAN) injection 4 mg, 4 mg, Intravenous, Q6H PRN, Wyn Quaker, Marlow Baars, MD .  oxyCODONE (Oxy IR/ROXICODONE) immediate release tablet 10 mg, 10 mg, Oral, Q4H PRN, Annice Needy, MD, 10 mg at 11/29/19 2110 .  oxyCODONE (Oxy IR/ROXICODONE) immediate release tablet 5 mg, 5 mg, Oral, Q4H PRN, Annice Needy, MD, 5 mg at 11/28/19 1954 .  piperacillin-tazobactam (ZOSYN) IVPB 3.375 g, 3.375 g, Intravenous, Q8H, Ronnald Ramp, RPH, Stopped at 11/30/19 4132 .  vancomycin (VANCOREADY) IVPB 1250 mg/250 mL, 1,250 mg, Intravenous, Q12H, Ronnald Ramp, RPH, Stopped at 11/30/19 4401    ALLERGIES   Patient has no known allergies.    REVIEW OF SYSTEMS    Unable to obtain ROS due to mild confusion  PHYSICAL EXAMINATION   Vital Signs: Temp:  [98.8 F (37.1 C)-101.6 F (38.7 C)] 98.8 F (37.1 C) (02/14 0800) Pulse Rate:  [79-106] 89 (02/14 1000) Resp:  [14-26] 16 (02/14 1000) BP: (93-171)/(53-140) 146/76 (02/14 1000) SpO2:  [95 %-100 %] 99 % (02/14 1000) Weight:  [78.1 kg] 78.1 kg (02/14 0423)  GENERAL:NAD HEAD: Normocephalic, atraumatic.  EYES: Pupils equal, round, reactive to light.  No scleral icterus.  MOUTH: Moist mucosal membrane. NECK: Supple. No thyromegaly. No nodules. No JVD.  PULMONARY: ctab CARDIOVASCULAR: S1 and S2. Regular rate and rhythm. No murmurs, rubs, or gallops.  GASTROINTESTINAL: Soft, nontender, non-distended. No masses. Positive bowel sounds. No hepatosplenomegaly.  MUSCULOSKELETAL: No swelling, clubbing, or edema of LLE. Tender erythematous right foot with erythematous linear streaking consistent with possible lymphatic tracking.    NEUROLOGIC: Calm non-aggitated, no FND grossly SKIN:intact,warm,dry   PERTINENT DATA     Infusions: . sodium chloride 10 mL/hr at 11/30/19 0906  . dexmedetomidine (PRECEDEX) IV infusion Stopped (11/28/19 2127)  . piperacillin-tazobactam (ZOSYN)  IV Stopped (11/30/19 0906)  . vancomycin Stopped (11/30/19 0272)   Scheduled Medications: . apixaban  5 mg Oral BID  . aspirin EC  81 mg Oral Daily  . atorvastatin  10 mg Oral q1800  . Chlorhexidine Gluconate Cloth  6 each Topical Daily  . cloNIDine  0.1 mg Oral TID  . feeding supplement (ENSURE ENLIVE)  237 mL Oral TID BM  . multivitamin with minerals  1 tablet Oral Daily  . nicotine  21 mg Transdermal Daily   PRN Medications: sodium chloride, acetaminophen **OR** acetaminophen, LORazepam, morphine injection, ondansetron **OR** ondansetron (ZOFRAN) IV, oxyCODONE, oxyCODONE Hemodynamic parameters:   Intake/Output: 02/13 0701 - 02/14 0700 In: 1503.5 [I.V.:778.7; IV Piggyback:724.8] Out: 915 [Urine:915]  Ventilator  Settings:  LAB RESULTS:  Basic Metabolic Panel: Recent Labs  Lab 11/26/19 0504 11/26/19 0504 11/27/19 0411 11/27/19 0411 11/28/19 0752 11/28/19 0752 11/29/19 0517 11/30/19 0502  NA 138  --  137  --  136  --  136 136  K 4.0   < > 4.0   < > 4.0   < > 4.1 3.7  CL 104  --  102  --  102  --  102 103  CO2 25  --  29  --  24  --  24 23  GLUCOSE 108*  --  118*  --  111*  --  130* 100*  BUN 7  --  5*  --  8  --  9 9  CREATININE 0.70  --  0.69  --  0.69  --  0.78 0.83  CALCIUM 8.9  --  9.2  --  9.1  --  8.4* 8.2*  MG 2.1  --  1.8  --  1.9  --  1.8 1.8   < > = values in this interval not displayed.   Liver Function Tests: No results for input(s): AST, ALT, ALKPHOS, BILITOT, PROT, ALBUMIN in the last 168 hours. No results for input(s): LIPASE, AMYLASE in the last 168 hours. No results for input(s): AMMONIA in the last 168 hours. CBC: Recent Labs  Lab 11/26/19 0504 11/27/19 0411 11/28/19 0752  11/29/19 0517 11/30/19 0502  WBC 9.3 10.1 10.8* 15.3* 14.2*  HGB 13.3 13.5 13.2 10.8* 9.2*  HCT 39.8 39.7 39.2 32.7* 27.9*  MCV 90.7 90.8 90.5 92.1 91.8  PLT 430* 415* 414* 345 302   Cardiac Enzymes: No results for input(s): CKTOTAL, CKMB, CKMBINDEX, TROPONINI in the last 168 hours. BNP: Invalid input(s): POCBNP CBG: Recent Labs  Lab 11/28/19 1946  GLUCAP 168*     IMAGING RESULTS:  Imaging: DG Chest Port 1 View  Result Date: 11/29/2019 CLINICAL DATA:  Fever. S/p bilateral femoral endarterectomies and stenting. Hx of PAD, current smoker. EXAM: PORTABLE CHEST 1 VIEW COMPARISON:  Chest radiograph 11/26/2019 FINDINGS: Stable cardiomediastinal contours given technique. Low volumes with bronchovascular crowding in the lung bases. No definite new focal infiltrate. No pneumothorax or large pleural effusion. Probable old left rib fracture. IMPRESSION: Low volumes with bronchovascular crowding. No definite focal infiltrate. Electronically Signed   By: Audie Pinto M.D.   On: 11/29/2019 12:10   VASCULAR C-ARM IMAGES ONLY ([facility] IR)  Result Date: 11/28/2019 Please refer to notes tab for details about interventional procedure. (Op Note)     ASSESSMENT AND PLAN    -Multidisciplinary rounds held today  Altered mental status with confusion- 2/14-improved now with calm appropriate mentation - due to alcohol withdrawal  - thiamine/folate repletion - CIWA protocol -follow [place] EtOH withdrawal protocol -s/p Ativan this am -patient admits to EtOH and THC abuse -monitor for DTs/ seizures   Probable right lower extremity cellulitis -patient admits to "scraping foot when he fell" -febrile with elevated WBC count today - will cover with broad spectrum antibiotics -pharmD consult for vanc/zosyn pharmacodynamics -procalcitonin trend -MRSA pcr negative - -ICU telemetrymonitoring -demarcated erythematous margins without progression overnight   Severe PAD with critical limb  ischemia - vascular team on case  -POD2- 11/30/19 - B/L fem endarterectomy with stenting - hemodynamically stable -adequate analgesia   Tobacco abuse with probable COPD  - 14mg  nicotine replacement  - COPD workup on outpatient    ID -continue IV abx as prescibed -follow up cultures  GI/Nutrition GI PROPHYLAXIS as indicated DIET-->TF's as tolerated  Constipation protocol as indicated  ENDO - ICU hypoglycemic\Hyperglycemia protocol -check FSBS per protocol   ELECTROLYTES -follow labs as needed -replace as needed -pharmacy consultation   DVT/GI PRX ordered -SCDs  TRANSFUSIONS AS NEEDED MONITOR FSBS ASSESS the need for LABS as needed   Critical care provider statement:    Critical care time (minutes):  33   Critical care time was exclusive of:  Separately billable procedures and treating other patients   Critical care was necessary to treat or prevent imminent or life-threatening deterioration of the following conditions:  critical limb ischemia, alcohol /drug withdrawal, probable sepsis, multiple comoirid conditiosn.    Critical care was time spent personally by me on the following activities:  Development of treatment plan with patient or surrogate, discussions with consultants, evaluation of patient's response to treatment, examination of patient, obtaining history from patient or surrogate, ordering and performing treatments and interventions, ordering and review of laboratory studies and re-evaluation of patient's condition.  I assumed direction of critical care for this patient from another provider in my specialty: no    This document was prepared using Dragon voice recognition software and may include unintentional dictation errors.    Vida Rigger, M.D.  Division of Pulmonary & Critical Care Medicine  [facility]

## 2019-12-01 ENCOUNTER — Inpatient Hospital Stay: Payer: Self-pay

## 2019-12-01 DIAGNOSIS — I70221 Atherosclerosis of native arteries of extremities with rest pain, right leg: Secondary | ICD-10-CM

## 2019-12-01 LAB — BASIC METABOLIC PANEL WITH GFR
Anion gap: 7 (ref 5–15)
BUN: 11 mg/dL (ref 6–20)
CO2: 25 mmol/L (ref 22–32)
Calcium: 8.3 mg/dL — ABNORMAL LOW (ref 8.9–10.3)
Chloride: 104 mmol/L (ref 98–111)
Creatinine, Ser: 0.85 mg/dL (ref 0.61–1.24)
GFR calc Af Amer: >60 mL/min
GFR calc non Af Amer: >60 mL/min
Glucose, Bld: 118 mg/dL — ABNORMAL HIGH (ref 70–99)
Potassium: 3.6 mmol/L (ref 3.5–5.1)
Sodium: 136 mmol/L (ref 135–145)

## 2019-12-01 LAB — CBC
HCT: 28.1 % — ABNORMAL LOW (ref 39.0–52.0)
Hemoglobin: 9.2 g/dL — ABNORMAL LOW (ref 13.0–17.0)
MCH: 30.3 pg (ref 26.0–34.0)
MCHC: 32.7 g/dL (ref 30.0–36.0)
MCV: 92.4 fL (ref 80.0–100.0)
Platelets: 305 10*3/uL (ref 150–400)
RBC: 3.04 MIL/uL — ABNORMAL LOW (ref 4.22–5.81)
RDW: 13.2 % (ref 11.5–15.5)
WBC: 13.8 10*3/uL — ABNORMAL HIGH (ref 4.0–10.5)
nRBC: 0 % (ref 0.0–0.2)

## 2019-12-01 LAB — PROCALCITONIN: Procalcitonin: 0.13 ng/mL

## 2019-12-01 LAB — MAGNESIUM: Magnesium: 2 mg/dL (ref 1.7–2.4)

## 2019-12-01 MED ORDER — ZOLPIDEM TARTRATE 5 MG PO TABS
5.0000 mg | ORAL_TABLET | Freq: Every evening | ORAL | Status: DC | PRN
  Administered 2019-12-01 – 2019-12-03 (×3): 5 mg via ORAL
  Filled 2019-12-01 (×3): qty 1

## 2019-12-01 MED ORDER — SENNOSIDES-DOCUSATE SODIUM 8.6-50 MG PO TABS
1.0000 | ORAL_TABLET | Freq: Two times a day (BID) | ORAL | Status: DC
  Administered 2019-12-01 – 2019-12-04 (×5): 1 via ORAL
  Filled 2019-12-01 (×5): qty 1

## 2019-12-01 NOTE — Progress Notes (Signed)
[facility] Progress Note  Patient: Brendan Williams    ZWC:[id number]  DOA: 11/25/2019     Date of Service: the patient was seen and examined on 12/01/2019  Chief complaint Right fifth toe gangrene, cellulitis  Brief hospital course: Brendan Williams is a 59 y.o. male who presents with darkening discoloration to the right fifth toe with associated erythema and edema that is extending proximal over the foot and ankle.  Patient was admitted for further work-up due to gangrenous changes and cellulitis.  Vascular determine that he had very poor blood flow and perform revascularization procedure to the right lower extremity.  Podiatry was consulted at that point after revascularization for potential amputation of the right fifth toe.  Currently further plan is amputation tomorrow..  Assessment and Plan: 1.  Right leg cellulitis. Severe PAD with critical limb ischemia. Initially started on broad-spectrum antibiotic coverage. Currently on IV Zosyn. Vascular surgery was the primary on the patient initially. SP bilateral femoral endarterectomy with stenting. Pain control per vascular surgery. Monitor.  Antibiotics per vascular surgery as well.  2.  Alcohol abuse. Smoking. Acute encephalopathy secondary to alcohol withdrawal Continue CIWA protocol. Continue B12 and folic acid. Continue nicotine patch. Also on clonidine  3.  Constipation. Bowel regimen initiated.  4.  Hyperlipidemia Continue Lipitor.  5.  Right foot toe gangrene Scheduled for amputation tomorrow by podiatry.  Rapid Covid PCR ordered at the request.  6.  Moderate malnutrition Body mass index is 23.53 kg/m.  Nutrition Problem: Moderate Malnutrition Etiology: poor appetite Interventions: Interventions: Ensure Enlive (each supplement provides 350kcal and 20 grams of protein), MVI, Magic cup   Diet: Cardiac diet DVT Prophylaxis: Therapeutic Anticoagulation with Eliquis   Advance goals of care discussion: Full  code  Family Communication: family was present at bedside, at the time of interview.  The pt provided permission to discuss medical plan with the family. Opportunity was given to ask question and all questions were answered satisfactorily.   Disposition:  Pt is from home, admitted with gangrene and cellulitis, still has gangrene requiring amputation tomorrow, which precludes a safe discharge. Discharge to home with home health per patient request given the PT recommends SNF, when cleared by surgery.  Subjective: No nausea no vomiting no fever no chills.  No chest pain no abdominal pain.  Reports constipation.  Physical Exam: General:  alert oriented to time, place, and person.  Appear in mild distress, affect appropriate Eyes: PERRL ENT: Oral Mucosa Clear, moist  Neck: no JVD,  Cardiovascular: S1 and S2 Present, no Murmur,  Respiratory: good respiratory effort, Bilateral Air entry equal and Decreased, no Crackles, no wheezes Abdomen: Bowel Sound present, Soft and no tenderness,  Skin: no rash Extremities: no Pedal edema, no calf tenderness Neurologic: without any new focal findings  Gait not checked due to patient safety concerns  Vitals:   11/30/19 2202 11/30/19 2347 12/01/19 0801 12/01/19 1559  BP: 116/68 111/71 115/70 130/72  Pulse:  81 65 79  Resp:  20 16 18   Temp:  98.4 F (36.9 C) 98.5 F (36.9 C) 98.5 F (36.9 C)  TempSrc:  Oral Oral   SpO2:  100% 99% 98%  Weight:  78.7 kg    Height:        Intake/Output Summary (Last 24 hours) at 12/01/2019 1927 Last data filed at 12/01/2019 1515 Gross per 24 hour  Intake 240 ml  Output 1100 ml  Net -860 ml   Filed Weights   11/27/19 0558 11/30/19 0423 11/30/19  2347  Weight: 75.3 kg 78.1 kg 78.7 kg    Data Reviewed: I have personally reviewed and interpreted daily labs, tele strips, imagings as discussed above. I reviewed all nursing notes, pharmacy notes, vitals, pertinent old records I have discussed plan of care as  described above with RN and patient/family.  CBC: Recent Labs  Lab 11/27/19 0411 11/28/19 0752 11/29/19 0517 11/30/19 0502 12/01/19 0324  WBC 10.1 10.8* 15.3* 14.2* 13.8*  HGB 13.5 13.2 10.8* 9.2* 9.2*  HCT 39.7 39.2 32.7* 27.9* 28.1*  MCV 90.8 90.5 92.1 91.8 92.4  PLT 415* 414* 345 302 027   Basic Metabolic Panel: Recent Labs  Lab 11/27/19 0411 11/28/19 0752 11/29/19 0517 11/30/19 0502 12/01/19 0324  NA 137 136 136 136 136  K 4.0 4.0 4.1 3.7 3.6  CL 102 102 102 103 104  CO2 29 24 24 23 25   GLUCOSE 118* 111* 130* 100* 118*  BUN 5* 8 9 9 11   CREATININE 0.69 0.69 0.78 0.83 0.85  CALCIUM 9.2 9.1 8.4* 8.2* 8.3*  MG 1.8 1.9 1.8 1.8 2.0    Studies: No results found.  Scheduled Meds: . apixaban  5 mg Oral BID  . aspirin EC  81 mg Oral Daily  . atorvastatin  10 mg Oral q1800  . Chlorhexidine Gluconate Cloth  6 each Topical Daily  . cloNIDine  0.1 mg Oral TID  . feeding supplement (ENSURE ENLIVE)  237 mL Oral TID BM  . multivitamin with minerals  1 tablet Oral Daily  . nicotine  21 mg Transdermal Daily  . senna-docusate  1 tablet Oral BID   Continuous Infusions: . sodium chloride Stopped (11/30/19 1316)  . piperacillin-tazobactam (ZOSYN)  IV 3.375 g (12/01/19 1322)  . vancomycin Stopped (12/01/19 1200)   PRN Meds: sodium chloride, acetaminophen **OR** acetaminophen, LORazepam **OR** LORazepam, morphine injection, ondansetron **OR** ondansetron (ZOFRAN) IV, oxyCODONE, oxyCODONE, zolpidem  Time spent: 35 minutes  Author: Berle Mull, MD [facility] 12/01/2019 7:27 PM  To reach On-call, see care teams to locate the attending and reach out to them via [email or web address]. If 7PM-7AM, please contact night-coverage If you still have difficulty reaching the attending provider, please page the [facility] (Director on Call) for [facility] on amion for assistance.

## 2019-12-01 NOTE — Progress Notes (Signed)
Pt sleeping soundly at this time.

## 2019-12-01 NOTE — Evaluation (Signed)
Occupational Therapy Evaluation Patient Details Name: Brendan Williams MRN: [id number] DOB: December 28, 1960 Today's Date: 12/01/2019    History of Present Illness 59 year old with a history of alcoholism, lifelong smoking, GERD does not follow with primary care doctor, came in with problems associated with his severe peripheral artery disease namely SFA and common femoral occlusion status post aorto iliac surgery on January 2021 when he was noted to have critical limb ischemia of his toes.  S/p b/l endartectomies/stent placement surgeries 2/12.  WBAT in b/l LEs per vascular sx.   Clinical Impression   Patient seen this date for OT evaluation following B/L femoral endarterectomy with stenting.  Patient is WBAT for B LEs.  Patient agreeable to therapy and motivated to return home, however, he was limited in ability to participate secondary to pain.  Nurse administered pain medication during treatment session.  Attempted to sit up at EOB with MIN A, but unable to tolerate secondary to pain.  Patient declined attempting to stand. Performed simple grooming tasks while supine with set up.  Therapist provided education on importance of ankle pumps while in bed to improve circulation and simple stretches to reduce pain and improve participation in ADLs.  Wife present for evaluation and verbalized acknowledgement of education along with patient.  Based on performance, recommending SNF at discharge, but patient/spouse are anticipating discharge home.       Follow Up Recommendations  SNF(Patient is anticipating D/C home)    Equipment Recommendations       Recommendations for Other Services       Precautions / Restrictions Precautions Precautions: Fall Restrictions Weight Bearing Restrictions: No RLE Weight Bearing: Weight bearing as tolerated LLE Weight Bearing: Weight bearing as tolerated      Mobility Bed Mobility Overal bed mobility: Needs Assistance Bed Mobility: Supine to Sit;Sit to Supine      Supine to sit: Min assist Sit to supine: Min assist   General bed mobility comments: Patient willing to attempt, but unable to complete full sitting at EOB secondary to pain.  Transfers                 General transfer comment: Unable to attempt functional transfer this date secondary to pain.    Balance                                           ADL either performed or assessed with clinical judgement   ADL Overall ADL's : Needs assistance/impaired Eating/Feeding: Set up   Grooming: Wash/dry face;Oral care;Brushing hair;Set up Grooming Details (indicate cue type and reason): Able to perform while supine in bed with SBA for safety.         Upper Body Dressing : Minimal assistance;Sitting   Lower Body Dressing: Maximal assistance;Sitting/lateral leans     Toilet Transfer Details (indicate cue type and reason): unable to perform secondary to pain   Toileting - Clothing Manipulation Details (indicate cue type and reason): unable to perform secondary to pain   Tub/Shower Transfer Details (indicate cue type and reason): unable to perform secondary to pain Functional mobility during ADLs: (unable to perform secondary to pain) General ADL Comments: Patient attempted to sit up at EOB to perform simple hygiene tasks.  Required MIN A and head of bed elevated to initiate.  Patient stated he is unable due to pain and laid back supine with MIN A.  Performed simple grooming  while supine with set up.     Vision         Perception     Praxis      Pertinent Vitals/Pain Pain Score: 7 (Nurse administered pain medication during evaluation) Pain Location: B/L feet, legs, groin     Hand Dominance Left   Extremity/Trunk Assessment Upper Extremity Assessment Upper Extremity Assessment: Overall WFL for tasks assessed(Noted slight tremor with exertion)   Lower Extremity Assessment Lower Extremity Assessment: Defer to PT evaluation       Communication  Communication Communication: No difficulties   Cognition Arousal/Alertness: Awake/alert Behavior During Therapy: WFL for tasks assessed/performed Overall Cognitive Status: Within Functional Limits for tasks assessed                                     General Comments       Exercises Other Exercises Other Exercises: Provided education to patient on importance of ankle pumps to improve circulation. Other Exercises: Provided education on simple stretches within precautions to improve ROM/strength needed for grooming and functional transfers.   Shoulder Instructions      Home Living Family/patient expects to be discharged to:: Private residence Living Arrangements: Spouse/significant other;Other relatives Available Help at Discharge: Family;Available 24 hours/day   Home Access: Stairs to enter Entrance Stairs-Number of Steps: 3   Home Layout: One level     Bathroom Shower/Tub: Occupational psychologist: Standard     Home Equipment: Environmental consultant - 2 wheels;Cane - single point;Shower seat;Grab bars - tub/shower          Prior Functioning/Environment Level of Independence: Independent        Comments: Pt reports that he does not drive, but can be out in the community regularly, does not use ADs        OT Problem List: Decreased strength;Decreased activity tolerance;Decreased safety awareness;Pain      OT Treatment/Interventions: Self-care/ADL training;Therapeutic exercise;Therapeutic activities    OT Goals(Current goals can be found in the care plan section) Acute Rehab OT Goals Patient Stated Goal: Get up and maneuver around. OT Goal Formulation: With patient Time For Goal Achievement: 12/15/19 Potential to Achieve Goals: Good  OT Frequency: Min 2X/week   Barriers to D/C:            Co-evaluation              AM-PAC OT "6 Clicks" Daily Activity     Outcome Measure Help from another person eating meals?: A Little Help from another  person taking care of personal grooming?: A Little Help from another person toileting, which includes using toliet, bedpan, or urinal?: A Lot Help from another person bathing (including washing, rinsing, drying)?: A Lot Help from another person to put on and taking off regular upper body clothing?: A Little Help from another person to put on and taking off regular lower body clothing?: A Lot 6 Click Score: 15   End of Session Nurse Communication: Weight bearing status  Activity Tolerance: Patient limited by pain Patient left: in bed;with call bell/phone within reach;with bed alarm set;with family/visitor present  OT Visit Diagnosis: Muscle weakness (generalized) (M62.81)                Time: 8144-8185 OT Time Calculation (min): 28 min Charges:  OT General Charges $OT Visit: 1 Visit OT Evaluation $OT Eval Low Complexity: 1 Low OT Treatments $Self Care/Home Management : 8-22 mins $Therapeutic  Activity: 8-22 mins  Louanne Belton, MS, OTR/L 12/01/19, 10:05 AM

## 2019-12-01 NOTE — Plan of Care (Signed)

## 2019-12-01 NOTE — Progress Notes (Signed)
Received a call from Dr. Allena Katz regarding need for a rapid covid test prior to patient surgery tomorrow. Patient had negative covid test on 11/28/19. Dr. Allena Katz stated he was told by the OR that he needed one within 72 hrs. Per the OR charge nurse Merry Proud the test he had on 11/28/19 is sufficient for him to have surgery tomorrow. She will place a note on the chart that patient had a negative result this admission. Per Merry Proud as long as the patient has not left the hospital since his last negative test it can be used. Primary RN Denny Peon notified as well so she can pass it on to dayshift RN.

## 2019-12-01 NOTE — Progress Notes (Signed)
[facility] Daily Progress Note   Subjective: 1. Left common femoral, profunda femoris, and superficial femoral artery endarterectomies 2.   Right common femoral, profunda femoris, and superficial femoral artery endarterectomies 3.   Fogarty embolectomy balloon to the right iliac arteries using a 4 Fogarty balloon 4.   Fogarty embolectomy balloon to the left iliac arteries using a 3 and a 4 Fogarty balloon 5.   Catheter placement into the aorta from bilateral femoral approaches 6.   Aortogram and bilateral lower extremity angiograms 7.   Kissing balloon stent placements into the common iliac arteries bilaterally with 9 mm diameter by 58 mm length lifestream stents 8.   Additional stent placement into the right iliac arteries both common and external with 9 mm diameter by 58 mm length lifestream stent and a 9 mm diameter by 7.5 cm length Viabahn stent 9.   Additional stent placement to the left iliac arteries both common and external with 9 mm diameter by 58 mm length lifestream stent and a 9 mm diameter by 10 cm length Viabahn stent 10.  Additional stent placement into the aorta from bilateral approaches to extend inflow proximally using 12 mm diameter by 38 mm length lifestream stents from each side 11.  Angioplasty of the right anterior tibial artery with 3 mm diameter by 10 cm length angioplasty balloon 12.  Viabahn stent placement to the right SFA and proximal popliteal artery with 6 mm diameter by 25 cm length stent 13.  Viabahn stent placement to the left SFA with 6 mm diameter by 15 cm length stent  Patient without complaint. Some soreness to bilateral lower extremities.   Objective: Vitals:   11/30/19 1724 11/30/19 2202 11/30/19 2347 12/01/19 0801  BP:  116/68 111/71 115/70  Pulse:   81 65  Resp:   20 16  Temp: 99.2 F (37.3 C)  98.4 F (36.9 C) 98.5 F (36.9 C)  TempSrc:   Oral Oral  SpO2:   100% 99%  Weight:   78.7 kg   Height:         Intake/Output Summary (Last 24 hours) at 12/01/2019 1041 Last data filed at 12/01/2019 3419 Gross per 24 hour  Intake 470.58 ml  Output 850 ml  Net -379.42 ml   Physical Exam: A&Ox3, NAD CV: RRR Pulmonary: CTA Bilaterally Abdomen: Soft, Nontender, Nondistended Right Groin: Prevena VAC has been removed.  Incision is clean dry and intact.  Staples are intact. Left Groin: Prevena VAC has been removed.  Incision is clean dry and intact.  Staples are intact. Vascular:  Left Lower Extremity: Thigh soft.  Calf soft.  Extremities warm distally in toes.  Motor/sensory is intact.  No compartment syndrome noted.  Right Lower Extremity:  Thigh soft.  Calf soft.  Extremities warm distally in toes.  Motor/sensory is intact.  No compartment syndrome noted.  Toes are warm however the left fifth toe does not seem viable.  Red streaking to the extremity has improved.  Laboratory: CBC    Component Value Date/Time   WBC 13.8 (H) 12/01/2019 0324   HGB 9.2 (L) 12/01/2019 0324   HCT 28.1 (L) 12/01/2019 0324   PLT 305 12/01/2019 0324   BMET    Component Value Date/Time   NA 136 12/01/2019 0324   K 3.6 12/01/2019 0324   CL 104 12/01/2019 0324   CO2 25 12/01/2019 0324   GLUCOSE 118 (H) 12/01/2019 0324   BUN 11 12/01/2019 0324   CREATININE 0.85 12/01/2019 0324  CALCIUM 8.3 (L) 12/01/2019 0324   GFRNONAA >60 12/01/2019 0324   GFRAA >60 12/01/2019 0324   Assessment/Planning: The patient is a 59 year old male with known severe peripheral artery disease of bilateral extremities.  Status post bilateral femoral endarterectomy with endovascular intervention POD#3  1) Hemoglobin is stable. 2) CIWA protocol started Friday for possible DT's.  Patient seems to be doing fine and asymptomatic today. 3) Prevena groin VACS have been removed.  Groin incisions are clean, dry and intact 4) Right fifth toe does not look viable.  Will consult podiatry for recommendations if amputation is recommended. 5) OT/PT  recommending SNF placement.  Continue PT/OT/ambulation with assistance.  Seen and examined with Dr. Wallis Mart [facility] PA-C 12/01/2019 10:41 AM

## 2019-12-01 NOTE — Consult Note (Signed)
PODIATRY / FOOT AND ANKLE SURGERY CONSULTATION NOTE  Requesting Physician: Cleda Daub PA  Reason for consult: Right fifth toe gangrene, cellulitis  Chief Complaint: Right foot infection   HPI: Brendan Williams is a 59 y.o. male who presents with darkening discoloration to the right fifth toe with associated erythema and edema that is extending proximal over the foot and ankle.  Patient was admitted for further work-up due to gangrenous changes and cellulitis.  Vascular determine that he had very poor blood flow and perform revascularization procedure to the right lower extremity.  Podiatry was consulted at that point after revascularization for potential amputation of the right fifth toe.  PMHx:  Past Medical History:  Diagnosis Date  . Peripheral arterial disease (HCC)     Surgical Hx:  Past Surgical History:  Procedure Laterality Date  . APPLICATION OF WOUND VAC Bilateral 11/28/2019   Procedure: APPLICATION OF WOUND VAC;  Surgeon: Annice Needy, MD;  Location: [facility] ORS;  Service: Vascular;  Laterality: Bilateral;  Prevena   . BACK SURGERY     lower ruptured disk  . CENTRAL VENOUS CATHETER INSERTION  11/28/2019   Procedure: INSERTION CENTRAL LINE ADULT;  Surgeon: Annice Needy, MD;  Location: [facility] ORS;  Service: Vascular;;  . ENDARTERECTOMY FEMORAL Bilateral 11/28/2019   Procedure: ENDARTERECTOMY FEMORAL;  Surgeon: Annice Needy, MD;  Location: [facility] ORS;  Service: Vascular;  Laterality: Bilateral;  . LOWER EXTREMITY ANGIOGRAPHY Right 11/14/2019   Procedure: LOWER EXTREMITY ANGIOGRAPHY;  Surgeon: Annice Needy, MD;  Location: [facility] INVASIVE CV LAB;  Service: Cardiovascular;  Laterality: Right;  . STOMACH SURGERY    . TONSILECTOMY, ADENOIDECTOMY, BILATERAL MYRINGOTOMY AND TUBES      FHx:  Family History  Problem Relation Age of Onset  . Leukemia Mother   . Heart disease Father        a. CABG  . Heart disease Sister        a. MVR  . Cancer Sister   . Cancer Brother   . CAD  Brother        a. CABG    Social History:  reports that he has been smoking cigarettes. He has been smoking about 1.00 pack per day. He has never used smokeless tobacco. He reports current alcohol use of about 5.0 standard drinks of alcohol per week. He reports current drug use. Frequency: 3.00 times per week. Drug: Marijuana.  Allergies: No Known Allergies  Review of Systems: General ROS: negative Respiratory ROS: no cough, shortness of breath, or wheezing Cardiovascular ROS: no chest pain or dyspnea on exertion Musculoskeletal ROS: positive for - joint swelling and pain in foot - right Neurological ROS: positive for - numbness/tingling Dermatological ROS: positive for Gangrene right fifth toe, redness and swelling right leg anterior/dorsal  Medications Prior to Admission  Medication Sig Dispense Refill  . aspirin EC 81 MG tablet Take 1 tablet (81 mg total) by mouth daily. 150 tablet 2  . atorvastatin (LIPITOR) 10 MG tablet Take 1 tablet (10 mg total) by mouth daily. 30 tablet 11  . clopidogrel (PLAVIX) 75 MG tablet Take 1 tablet (75 mg total) by mouth daily. 30 tablet 11    Physical Exam: General: Alert and oriented.  No apparent distress.  Vascular: DP/PT pulses +1 bilateral, capillary fill time intact to the forefoot with the exception of the right fifth toe which appears absent, no hair growth to digits.  Right lower extremity moderate pitting edema with associated erythema that extends from the right  foot to the ankle.  Neuro: Light touch sensation reduced to digits bilaterally.  Derm: Right fifth toe appears to be gangrenous to the PIPJ and slightly proximal to that point with associated erythema and edema that it is extending proximally to the ankle and over the dorsum of the foot.  No other open wounds noted.  MSK: Pain on palpation of the right foot especially at the fifth toe distally.  Results for orders placed or performed during the hospital encounter of 11/25/19 (from  the past 48 hour(s))  CBC     Status: Abnormal   Collection Time: 11/30/19  5:02 AM  Result Value Ref Range   WBC 14.2 (H) 4.0 - 10.5 K/uL   RBC 3.04 (L) 4.22 - 5.81 MIL/uL   Hemoglobin 9.2 (L) 13.0 - 17.0 g/dL   HCT 17.5 (L) 10.2 - 58.5 %   MCV 91.8 80.0 - 100.0 fL   MCH 30.3 26.0 - 34.0 pg   MCHC 33.0 30.0 - 36.0 g/dL   RDW 27.7 82.4 - 23.5 %   Platelets 302 150 - 400 K/uL   nRBC 0.0 0.0 - 0.2 %    Comment: Performed at [facility], [address] [place], [place] [address]  Basic metabolic panel     Status: Abnormal   Collection Time: 11/30/19  5:02 AM  Result Value Ref Range   Sodium 136 135 - 145 mmol/L   Potassium 3.7 3.5 - 5.1 mmol/L   Chloride 103 98 - 111 mmol/L   CO2 23 22 - 32 mmol/L   Glucose, Bld 100 (H) 70 - 99 mg/dL   BUN 9 6 - 20 mg/dL   Creatinine, Ser 3.15 0.61 - 1.24 mg/dL   Calcium 8.2 (L) 8.9 - 10.3 mg/dL   GFR calc non Af Amer >60 >60 mL/min   GFR calc Af Amer >60 >60 mL/min   Anion gap 10 5 - 15    Comment: Performed at [facility], [address] [place], [place] [address]  Magnesium     Status: None   Collection Time: 11/30/19  5:02 AM  Result Value Ref Range   Magnesium 1.8 1.7 - 2.4 mg/dL    Comment: Performed at [facility], [address] [place], [place] [address]  Procalcitonin     Status: None   Collection Time: 11/30/19  5:02 AM  Result Value Ref Range   Procalcitonin 0.14 ng/mL    Comment:        Interpretation: PCT (Procalcitonin) <= 0.5 ng/mL: Systemic infection (sepsis) is not likely. Local bacterial infection is possible. (NOTE)       Sepsis PCT Algorithm           Lower Respiratory Tract                                      Infection PCT Algorithm    ----------------------------     ----------------------------         PCT < 0.25 ng/mL                PCT < 0.10 ng/mL         Strongly encourage             Strongly discourage   discontinuation of antibiotics    initiation of antibiotics     ----------------------------     -----------------------------       PCT  0.25 - 0.50 ng/mL            PCT 0.10 - 0.25 ng/mL               OR       >80% decrease in PCT            Discourage initiation of                                            antibiotics      Encourage discontinuation           of antibiotics    ----------------------------     -----------------------------         PCT >= 0.50 ng/mL              PCT 0.26 - 0.50 ng/mL               AND        <80% decrease in PCT             Encourage initiation of                                             antibiotics       Encourage continuation           of antibiotics    ----------------------------     -----------------------------        PCT >= 0.50 ng/mL                  PCT > 0.50 ng/mL               AND         increase in PCT                  Strongly encourage                                      initiation of antibiotics    Strongly encourage escalation           of antibiotics                                     -----------------------------                                           PCT <= 0.25 ng/mL                                                 OR                                        > 80% decrease in PCT  Discontinue / Do not initiate                                             antibiotics Performed at [facility], [address] [place], [place] [address]   CBC     Status: Abnormal   Collection Time: 12/01/19  3:24 AM  Result Value Ref Range   WBC 13.8 (H) 4.0 - 10.5 K/uL   RBC 3.04 (L) 4.22 - 5.81 MIL/uL   Hemoglobin 9.2 (L) 13.0 - 17.0 g/dL   HCT 60.4 (L) 54.0 - 98.1 %   MCV 92.4 80.0 - 100.0 fL   MCH 30.3 26.0 - 34.0 pg   MCHC 32.7 30.0 - 36.0 g/dL   RDW 19.1 47.8 - 29.5 %   Platelets 305 150 - 400 K/uL   nRBC 0.0 0.0 - 0.2 %    Comment: Performed at [facility], [address] [place], [place] [address]  Basic metabolic panel     Status:  Abnormal   Collection Time: 12/01/19  3:24 AM  Result Value Ref Range   Sodium 136 135 - 145 mmol/L   Potassium 3.6 3.5 - 5.1 mmol/L   Chloride 104 98 - 111 mmol/L   CO2 25 22 - 32 mmol/L   Glucose, Bld 118 (H) 70 - 99 mg/dL   BUN 11 6 - 20 mg/dL   Creatinine, Ser 8.65 0.61 - 1.24 mg/dL   Calcium 8.3 (L) 8.9 - 10.3 mg/dL   GFR calc non Af Amer >60 >60 mL/min   GFR calc Af Amer >60 >60 mL/min   Anion gap 7 5 - 15    Comment: Performed at [facility], [address], [place], [place] [address]  Magnesium     Status: None   Collection Time: 12/01/19  3:24 AM  Result Value Ref Range   Magnesium 2.0 1.7 - 2.4 mg/dL    Comment: Performed at [facility], [address] [place], [place] [address]  Procalcitonin     Status: None   Collection Time: 12/01/19  3:24 AM  Result Value Ref Range   Procalcitonin 0.13 ng/mL    Comment:        Interpretation: PCT (Procalcitonin) <= 0.5 ng/mL: Systemic infection (sepsis) is not likely. Local bacterial infection is possible. (NOTE)       Sepsis PCT Algorithm           Lower Respiratory Tract                                      Infection PCT Algorithm    ----------------------------     ----------------------------         PCT < 0.25 ng/mL                PCT < 0.10 ng/mL         Strongly encourage             Strongly discourage   discontinuation of antibiotics    initiation of antibiotics    ----------------------------     -----------------------------       PCT 0.25 - 0.50 ng/mL            PCT 0.10 - 0.25 ng/mL  OR       >80% decrease in PCT            Discourage initiation of                                            antibiotics      Encourage discontinuation           of antibiotics    ----------------------------     -----------------------------         PCT >= 0.50 ng/mL              PCT 0.26 - 0.50 ng/mL               AND        <80% decrease in PCT             Encourage initiation of                                              antibiotics       Encourage continuation           of antibiotics    ----------------------------     -----------------------------        PCT >= 0.50 ng/mL                  PCT > 0.50 ng/mL               AND         increase in PCT                  Strongly encourage                                      initiation of antibiotics    Strongly encourage escalation           of antibiotics                                     -----------------------------                                           PCT <= 0.25 ng/mL                                                 OR                                        > 80% decrease in PCT                                     Discontinue / Do not initiate  antibiotics Performed at [facility], [address] [place], [place] [address]    No results found.  Blood pressure 130/72, pulse 79, temperature 98.5 F (36.9 C), resp. rate 18, height 6' (1.829 m), weight 78.7 kg, SpO2 98 %.  Assessment 1. Cellulitis right foot associated with gangrenous right fifth toe 2. 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 PVD status post revascularization  Plan -Patient seen and examined -Appears to have necrosis of the right fifth toe to the level of the proximal PIPJ and proximal phalanx.  Capillary fill time appears to be intact past that point at the metatarsal phalangeal joint. -Previous positive blood culture likely contaminate.  Consult placed for ID for further Abx control.  Appreciate recommendations.  Discussed with Dr. Rivka Safer. -Discussed all treatment options with the patient both conservative and surgical attempts at correction.  Once the benefits and complications of surgical intervention were discussed, the patient has elected for surgery consisting of right partial fifth ray amputation. -Patient has been scheduled for procedure around 1200 on 12/02/2019.  Patient  to be n.p.o. at midnight for surgical procedure. -Appreciate recommendations for antibiotics. -Patient needs STAT covid test prior to the procedure.  Message sent to Dr. Allena Katz, awaiting.  Rosetta Posner, DPM 12/01/2019, 4:50 PM

## 2019-12-01 NOTE — Progress Notes (Signed)
[name] visited with Brendan Williams, and Brendan Williams's wife Steward Drone. Brendan Williams looked dejected with condition. Brendan Williams reported that he has had stents put in him. Wife let [name] know that he is been in the hospital a week. Brendan Williams requested prayer. Brendan Williams was tearful during prayer.Requested [name] to come back and visit. Brendan Williams also said that his sis-in-law was here before, and chaplains helped her through her stay. [name] will check back on Brendan Williams during the week.   12/01/19 1331  Clinical Encounter Type  Visited With Patient and family together  Visit Type Initial;Psychological support;Spiritual support;Social support  Referral From Other (Comment)  Consult/Referral To  (Routine)  Spiritual Encounters  Spiritual Needs Prayer;Emotional  Stress Factors  Patient Stress Factors Health changes;Loss of control;Major life changes  Family Stress Factors Health changes

## 2019-12-01 NOTE — Progress Notes (Signed)
Physical Therapy Treatment Patient Details Name: Brendan Williams MRN: [id number] DOB: [birth year] Today's Date: 12/01/2019    History of Present Illness 59 year old with a history of alcoholism, lifelong smoking, GERD does not follow with primary care doctor, came in with problems associated with his severe peripheral artery disease namely SFA and common femoral occlusion status post aorto iliac surgery on January 2021 when he was noted to have critical limb ischemia of his toes.  S/p b/l endartectomies/stent placement surgeries 2/12.  WBAT in b/l LEs per vascular sx.    PT Comments    Pt is making gradual progress towards goals with improved mobility attempts this date. Pt still fearful of expected pain and ambulates with hopping of R LE. Able to progress to foot flat during ambulation, however inconsistent. Limited mobility performed this date, however at end of session, pt reports it went better than he expected and was hopeful of decreased pain. Seated there-ex performed and pt appears motivated to participate. No CIWA symptoms at this time. Updated recommendations. Will continue to progress as able.   Follow Up Recommendations  Home health PT;Supervision for mobility/OOB     Equipment Recommendations  Rolling walker with 5" wheels    Recommendations for Other Services       Precautions / Restrictions Precautions Precautions: Fall Restrictions Weight Bearing Restrictions: Yes RLE Weight Bearing: Weight bearing as tolerated LLE Weight Bearing: Weight bearing as tolerated    Mobility  Bed Mobility Overal bed mobility: Needs Assistance Bed Mobility: Supine to Sit;Sit to Supine     Supine to sit: Min assist Sit to supine: Min assist   General bed mobility comments: Pt received sitting at EOB.  Transfers Overall transfer level: Needs assistance Equipment used: Rolling walker (2 wheeled) Transfers: Sit to/from Stand Sit to Stand: Min guard         General transfer  comment: safe technique with upright posture. Adjusted RW to correct height  Ambulation/Gait Ambulation/Gait assistance: Min guard Gait Distance (Feet): 10 Feet Assistive device: Rolling walker (2 wheeled) Gait Pattern/deviations: Step-to pattern     General Gait Details: ambulated in forward/retro pattern close to bedside. Pt unwilling to ambulate further in room this date due to pain. Keeps R foot elevated off bed, however with encouragement able to perform foot flat. Heavy WBing through B UEs   Stairs             Wheelchair Mobility    Modified Rankin (Stroke Patients Only)       Balance                                            Cognition Arousal/Alertness: Awake/alert Behavior During Therapy: WFL for tasks assessed/performed Overall Cognitive Status: Within Functional Limits for tasks assessed                                        Exercises Other Exercises Other Exercises: Provided education to patient on importance of ankle pumps to improve circulation. Other Exercises: Provided education on simple stretches within precautions to improve ROM/strength needed for grooming and functional transfers. Other Exercises: seated ther-ex performed on B LE including LAQ, AP, alt marching. All ther-ex performed x 15 reps with supervision    General Comments        Pertinent Vitals/Pain  Pain Assessment: 0-10 Pain Score: 7  Pain Location: B/L feet, legs, groin Pain Descriptors / Indicators: Aching;Operative site guarding Pain Intervention(s): Limited activity within patient's tolerance;Premedicated before session    Home Living Family/patient expects to be discharged to:: Private residence Living Arrangements: Spouse/significant other;Other relatives Available Help at Discharge: Family;Available 24 hours/day   Home Access: Stairs to enter   Home Layout: One level Home Equipment: Walker - 2 wheels;Cane - single point;Shower  seat;Grab bars - tub/shower      Prior Function Level of Independence: Independent      Comments: Pt reports that he does not drive, but can be out in the community regularly, does not use ADs   PT Goals (current goals can now be found in the care plan section) Acute Rehab PT Goals Patient Stated Goal: Get up and maneuver around. PT Goal Formulation: With patient Time For Goal Achievement: 12/13/19 Potential to Achieve Goals: Good Progress towards PT goals: Progressing toward goals    Frequency    Min 2X/week      PT Plan Discharge plan needs to be updated    Co-evaluation              AM-PAC PT "6 Clicks" Mobility   Outcome Measure  Help needed turning from your back to your side while in a flat bed without using bedrails?: None Help needed moving from lying on your back to sitting on the side of a flat bed without using bedrails?: None Help needed moving to and from a bed to a chair (including a wheelchair)?: A Little Help needed standing up from a chair using your arms (e.g., wheelchair or bedside chair)?: A Little Help needed to walk in hospital room?: A Little Help needed climbing 3-5 steps with a railing? : A Lot 6 Click Score: 19    End of Session Equipment Utilized During Treatment: Gait belt Activity Tolerance: Patient limited by pain Patient left: in bed(seated on EOB with wife in room) Nurse Communication: Mobility status PT Visit Diagnosis: Muscle weakness (generalized) (M62.81);Difficulty in walking, not elsewhere classified (R26.2);Unsteadiness on feet (R26.81);Ataxic gait (R26.0)     Time: 0539-7673 PT Time Calculation (min) (ACUTE ONLY): 23 min  Charges:  $Gait Training: 8-22 mins $Therapeutic Exercise: 8-22 mins                     Elizabeth Palau, PT, DPT [phone]    Drena Ham 12/01/2019, 11:46 AM

## 2019-12-02 ENCOUNTER — Inpatient Hospital Stay: Payer: Self-pay | Admitting: Anesthesiology

## 2019-12-02 ENCOUNTER — Inpatient Hospital Stay: Payer: Self-pay

## 2019-12-02 ENCOUNTER — Encounter
Admission: AD | Disposition: A | Discharge status: Home Health | Payer: Self-pay | Source: Ambulatory Visit | Attending: Vascular Surgery

## 2019-12-02 ENCOUNTER — Encounter: Payer: Self-pay | Admitting: Vascular Surgery

## 2019-12-02 DIAGNOSIS — Z89421 Acquired absence of other right toe(s): Secondary | ICD-10-CM

## 2019-12-02 DIAGNOSIS — L03115 Cellulitis of right lower limb: Secondary | ICD-10-CM

## 2019-12-02 DIAGNOSIS — F17210 Nicotine dependence, cigarettes, uncomplicated: Secondary | ICD-10-CM

## 2019-12-02 DIAGNOSIS — F10231 Alcohol dependence with withdrawal delirium: Secondary | ICD-10-CM

## 2019-12-02 DIAGNOSIS — D62 Acute posthemorrhagic anemia: Secondary | ICD-10-CM

## 2019-12-02 DIAGNOSIS — I96 Gangrene, not elsewhere classified: Secondary | ICD-10-CM

## 2019-12-02 DIAGNOSIS — Z95820 Peripheral vascular angioplasty status with implants and grafts: Secondary | ICD-10-CM

## 2019-12-02 HISTORY — PX: AMPUTATION TOE: SHX6595

## 2019-12-02 LAB — BASIC METABOLIC PANEL
Anion gap: 9 (ref 5–15)
BUN: 8 mg/dL (ref 6–20)
CO2: 25 mmol/L (ref 22–32)
Calcium: 8.1 mg/dL — ABNORMAL LOW (ref 8.9–10.3)
Chloride: 104 mmol/L (ref 98–111)
Creatinine, Ser: 0.71 mg/dL (ref 0.61–1.24)
GFR calc Af Amer: >60 mL/min (ref >60)
GFR calc non Af Amer: >60 mL/min (ref >60)
Glucose, Bld: 114 mg/dL — ABNORMAL HIGH (ref 70–99)
Potassium: 3.5 mmol/L (ref 3.5–5.1)
Sodium: 138 mmol/L (ref 135–145)

## 2019-12-02 LAB — CBC
HCT: 23.9 % — ABNORMAL LOW (ref 39.0–52.0)
Hemoglobin: 8.1 g/dL — ABNORMAL LOW (ref 13.0–17.0)
MCH: 30.7 pg (ref 26.0–34.0)
MCHC: 33.9 g/dL (ref 30.0–36.0)
MCV: 90.5 fL (ref 80.0–100.0)
Platelets: 350 10*3/uL (ref 150–400)
RBC: 2.64 MIL/uL — ABNORMAL LOW (ref 4.22–5.81)
RDW: 13.1 % (ref 11.5–15.5)
WBC: 9.8 10*3/uL (ref 4.0–10.5)
nRBC: 0 % (ref 0.0–0.2)

## 2019-12-02 LAB — MAGNESIUM: Magnesium: 1.9 mg/dL (ref 1.7–2.4)

## 2019-12-02 LAB — CULTURE, BLOOD (ROUTINE X 2): Special Requests: ADEQUATE

## 2019-12-02 LAB — LACTIC ACID, PLASMA
Lactic Acid, Venous: 1.1 mmol/L (ref 0.5–1.9)
Lactic Acid, Venous: 1 mmol/L (ref 0.5–1.9)

## 2019-12-02 LAB — SURGICAL PATHOLOGY

## 2019-12-02 SURGERY — AMPUTATION, TOE
Anesthesia: General | Site: Toe | Laterality: Right

## 2019-12-02 MED ORDER — BUPIVACAINE HCL 0.5 % IJ SOLN
INTRAMUSCULAR | Status: DC | PRN
  Administered 2019-12-02: 20 mL

## 2019-12-02 MED ORDER — FENTANYL CITRATE (PF) 100 MCG/2ML IJ SOLN
INTRAMUSCULAR | Status: AC
  Filled 2019-12-02: qty 2

## 2019-12-02 MED ORDER — FENTANYL CITRATE (PF) 100 MCG/2ML IJ SOLN
INTRAMUSCULAR | Status: DC | PRN
  Administered 2019-12-02 (×4): 25 ug via INTRAVENOUS
  Administered 2019-12-02: 50 ug via INTRAVENOUS

## 2019-12-02 MED ORDER — PROPOFOL 10 MG/ML IV BOLUS
INTRAVENOUS | Status: AC
  Filled 2019-12-02: qty 20

## 2019-12-02 MED ORDER — DEXMEDETOMIDINE HCL IN NACL 80 MCG/20ML IV SOLN
INTRAVENOUS | Status: AC
  Filled 2019-12-02: qty 20

## 2019-12-02 MED ORDER — LACTATED RINGERS IV BOLUS
500.0000 mL | Freq: Once | INTRAVENOUS | Status: AC
  Administered 2019-12-02: 500 mL via INTRAVENOUS

## 2019-12-02 MED ORDER — APIXABAN 5 MG PO TABS
5.0000 mg | ORAL_TABLET | Freq: Two times a day (BID) | ORAL | Status: DC
  Administered 2019-12-03 – 2019-12-04 (×3): 5 mg via ORAL
  Filled 2019-12-02 (×3): qty 1

## 2019-12-02 MED ORDER — FENTANYL CITRATE (PF) 100 MCG/2ML IJ SOLN: 25.0000 ug | INTRAMUSCULAR | Status: DC | PRN

## 2019-12-02 MED ORDER — MIDAZOLAM HCL 2 MG/2ML IJ SOLN
INTRAMUSCULAR | Status: AC
  Filled 2019-12-02: qty 2

## 2019-12-02 MED ORDER — LACTATED RINGERS IV SOLN: INTRAVENOUS | Status: DC | PRN

## 2019-12-02 MED ORDER — ENSURE PRE-SURGERY PO LIQD
296.0000 mL | Freq: Once | ORAL | Status: AC
  Administered 2019-12-02: 296 mL via ORAL
  Filled 2019-12-02: qty 296

## 2019-12-02 MED ORDER — NEOMYCIN-POLYMYXIN B GU 40-200000 IR SOLN
Status: DC | PRN
  Administered 2019-12-02: 2 mL

## 2019-12-02 MED ORDER — BUPIVACAINE HCL (PF) 0.5 % IJ SOLN
INTRAMUSCULAR | Status: AC
  Filled 2019-12-02: qty 30

## 2019-12-02 MED ORDER — PROPOFOL 500 MG/50ML IV EMUL
INTRAVENOUS | Status: DC | PRN
  Administered 2019-12-02: 50 ug/kg/min via INTRAVENOUS

## 2019-12-02 MED ORDER — PHENYLEPHRINE HCL (PRESSORS) 10 MG/ML IV SOLN
INTRAVENOUS | Status: DC | PRN
  Administered 2019-12-02 (×4): 100 ug via INTRAVENOUS
  Administered 2019-12-02: 200 ug via INTRAVENOUS
  Administered 2019-12-02: 100 ug via INTRAVENOUS

## 2019-12-02 MED ORDER — PROPOFOL 10 MG/ML IV BOLUS
INTRAVENOUS | Status: DC | PRN
  Administered 2019-12-02: 40 mg via INTRAVENOUS
  Administered 2019-12-02: 20 mg via INTRAVENOUS
  Administered 2019-12-02: 30 mg via INTRAVENOUS
  Administered 2019-12-02: 20 mg via INTRAVENOUS

## 2019-12-02 MED ORDER — ASPIRIN EC 81 MG PO TBEC
81.0000 mg | DELAYED_RELEASE_TABLET | Freq: Every day | ORAL | Status: DC
  Administered 2019-12-03 – 2019-12-04 (×2): 81 mg via ORAL
  Filled 2019-12-02 (×2): qty 1

## 2019-12-02 MED ORDER — ONDANSETRON HCL 4 MG/2ML IJ SOLN
INTRAMUSCULAR | Status: AC
  Filled 2019-12-02: qty 2

## 2019-12-02 MED ORDER — OXYCODONE HCL 5 MG/5ML PO SOLN: 5.0000 mg | Freq: Once | ORAL | Status: DC | PRN

## 2019-12-02 MED ORDER — SODIUM CHLORIDE 0.9 % IV SOLN
3.0000 g | Freq: Four times a day (QID) | INTRAVENOUS | Status: DC
  Administered 2019-12-02 – 2019-12-04 (×7): 3 g via INTRAVENOUS
  Filled 2019-12-02 (×9): qty 8
  Filled 2019-12-02: qty 3

## 2019-12-02 MED ORDER — OXYCODONE HCL 5 MG PO TABS: 5.0000 mg | ORAL_TABLET | Freq: Once | ORAL | Status: DC | PRN

## 2019-12-02 MED ORDER — EPHEDRINE SULFATE 50 MG/ML IJ SOLN
INTRAMUSCULAR | Status: AC
  Filled 2019-12-02: qty 1

## 2019-12-02 MED ORDER — EPHEDRINE SULFATE 50 MG/ML IJ SOLN
INTRAMUSCULAR | Status: DC | PRN
  Administered 2019-12-02 (×7): 10 mg via INTRAVENOUS
  Administered 2019-12-02 (×3): 5 mg via INTRAVENOUS

## 2019-12-02 MED ORDER — ONDANSETRON HCL 4 MG/2ML IJ SOLN
INTRAMUSCULAR | Status: DC | PRN
  Administered 2019-12-02: 4 mg via INTRAVENOUS

## 2019-12-02 MED ORDER — DEXMEDETOMIDINE HCL 200 MCG/2ML IV SOLN
INTRAVENOUS | Status: DC | PRN
  Administered 2019-12-02: 4 ug via INTRAVENOUS
  Administered 2019-12-02 (×2): 8 ug via INTRAVENOUS

## 2019-12-02 MED ORDER — MIDAZOLAM HCL 2 MG/2ML IJ SOLN
INTRAMUSCULAR | Status: DC | PRN
  Administered 2019-12-02: 2 mg via INTRAVENOUS

## 2019-12-02 SURGICAL SUPPLY — 42 items
BLADE MED AGGRESSIVE (BLADE) ×3 IMPLANT
BLADE OSC/SAGITTAL MD 5.5X18 (BLADE) IMPLANT
BNDG CONFORM 2 STRL LF (GAUZE/BANDAGES/DRESSINGS) IMPLANT
BNDG ELASTIC 4X5.8 VLCR STR LF (GAUZE/BANDAGES/DRESSINGS) ×3 IMPLANT
BNDG ESMARK 4X12 TAN STRL LF (GAUZE/BANDAGES/DRESSINGS) ×3 IMPLANT
BNDG GAUZE 4.5X4.1 6PLY STRL (MISCELLANEOUS) ×3 IMPLANT
CANISTER SUCT 1200ML W/VALVE (MISCELLANEOUS) ×3 IMPLANT
CNTNR SPEC 2.5X3XGRAD LEK (MISCELLANEOUS) ×1
CONT SPEC 4OZ STER OR WHT (MISCELLANEOUS) ×2
CONTAINER SPEC 2.5X3XGRAD LEK (MISCELLANEOUS) ×1 IMPLANT
COVER WAND RF STERILE (DRAPES) ×3 IMPLANT
CUFF TOURN 18 STER (MISCELLANEOUS) ×2 IMPLANT
DURAPREP 26ML APPLICATOR (WOUND CARE) ×3 IMPLANT
ELECT REM PT RETURN 9FT ADLT (ELECTROSURGICAL) ×3
ELECTRODE REM PT RTRN 9FT ADLT (ELECTROSURGICAL) ×1 IMPLANT
GAUZE SPONGE 4X4 12PLY STRL (GAUZE/BANDAGES/DRESSINGS) ×6 IMPLANT
GAUZE XEROFORM 1X8 LF (GAUZE/BANDAGES/DRESSINGS) ×3 IMPLANT
GLOVE BIO SURGEON STRL SZ7 (GLOVE) ×3 IMPLANT
GLOVE INDICATOR 7.0 STRL GRN (GLOVE) ×3 IMPLANT
GOWN STRL REUS W/ TWL LRG LVL3 (GOWN DISPOSABLE) ×2 IMPLANT
GOWN STRL REUS W/TWL LRG LVL3 (GOWN DISPOSABLE) ×4
HANDPIECE VERSAJET DEBRIDEMENT (MISCELLANEOUS) IMPLANT
HEMOSTAT SURGICEL 2X3 (HEMOSTASIS) ×2 IMPLANT
KIT TURNOVER KIT A (KITS) ×3 IMPLANT
LABEL OR SOLS (LABEL) ×3 IMPLANT
NDL FILTER BLUNT 18X1 1/2 (NEEDLE) ×1 IMPLANT
NDL HYPO 25X1 1.5 SAFETY (NEEDLE) ×2 IMPLANT
NEEDLE FILTER BLUNT 18X 1/2SAF (NEEDLE) ×2
NEEDLE FILTER BLUNT 18X1 1/2 (NEEDLE) ×1 IMPLANT
NEEDLE HYPO 25X1 1.5 SAFETY (NEEDLE) ×6 IMPLANT
NS IRRIG 500ML POUR BTL (IV SOLUTION) ×3 IMPLANT
PACK EXTREMITY ARMC (MISCELLANEOUS) ×3 IMPLANT
SOL .9 NS 3000ML IRR  AL (IV SOLUTION)
SOL .9 NS 3000ML IRR UROMATIC (IV SOLUTION) IMPLANT
SOL PREP PVP 2OZ (MISCELLANEOUS) ×3
SOLUTION PREP PVP 2OZ (MISCELLANEOUS) ×1 IMPLANT
STOCKINETTE STRL 6IN 960660 (GAUZE/BANDAGES/DRESSINGS) ×3 IMPLANT
SUT ETHILON 3-0 FS-10 30 BLK (SUTURE) ×6
SUT VIC AB 3-0 SH 27 (SUTURE) ×2
SUT VIC AB 3-0 SH 27X BRD (SUTURE) ×1 IMPLANT
SUTURE EHLN 3-0 FS-10 30 BLK (SUTURE) ×2 IMPLANT
SYR 10ML LL (SYRINGE) ×3 IMPLANT

## 2019-12-02 NOTE — Progress Notes (Signed)
[facility] Progress Note  Patient: Brendan Williams    PFX:[id number]  DOA: 11/25/2019     Date of Service: the patient was seen and examined on 12/02/2019  Chief complaint Right fifth toe gangrene, cellulitis  Brief hospital course: Brendan Williams is a 59 y.o. male who presents with darkening discoloration to the right fifth toe with associated erythema and edema that is extending proximal over the foot and ankle.  Patient was admitted for further work-up due to gangrenous changes and cellulitis.  Vascular determine that he had very poor blood flow and perform revascularization procedure to the right lower extremity.  Podiatry was consulted at that point after revascularization for potential amputation of the right fifth toe.  Currently further plan is amputation tomorrow..  Assessment and Plan: 1.  Right leg cellulitis. Severe PAD with critical limb ischemia. Right foot toe gangrene S/p AMPUTATION RIGHT 5TH TOE on 12/02/19 Initially started on broad-spectrum antibiotic coverage. Currently on IV Zosyn. Vascular surgery was the primary on the patient initially. SP bilateral femoral endarterectomy with stenting. Pain control per vascular surgery. Monitor.  Antibiotics per vascular surgery as well. --continue vanc and Unasyn  2.  Alcohol abuse. Smoking. Acute encephalopathy secondary to alcohol withdrawal Continue CIWA protocol. Continue H29 and folic acid. Continue nicotine patch. Also on clonidine  3.  Constipation. Bowel regimen initiated.  4.  Hyperlipidemia Continue Lipitor.  6.  Moderate malnutrition Body mass index is 23.5 kg/m.  Nutrition Problem: Moderate Malnutrition Etiology: poor appetite Interventions: Interventions: Ensure Enlive (each supplement provides 350kcal and 20 grams of protein), MVI, Magic cup   Diet: Cardiac diet DVT Prophylaxis: Therapeutic Anticoagulation with Eliquis   Advance goals of care discussion: Full code  Family  Communication: updated wife at bedside  Disposition:  Pt is from home, admitted with gangrene and cellulitis.  Discharge to home with home health per patient request given the PT recommends SNF, when cleared by surgery.  Subjective:  Went for digit amputation today.  Post-op, pt did well, no significant pain.  No fever, dyspnea, chest pain, abdominal pain, N/V/D, dysuria, increased swelling.  No BM yet.   Physical Exam: Constitutional: NAD, AAOx3 HEENT: conjunctivae and lids normal, EOMI CV: RRR no M,R,G. Distal pulses +2.  No cyanosis.   RESP: CTA B/L, normal respiratory effort  GI: +BS, NTND Extremities: No effusions, edema, or tenderness in LLE.  Right foot ACE wrapped. SKIN: warm, dry and intact Neuro: II - XII grossly intact.  Sensation intact Psych: Normal mood and affect.  Appropriate judgement and reason    Vitals:   12/02/19 1350 12/02/19 1408 12/02/19 1430 12/02/19 1603  BP: 100/90 112/65 118/74 107/74  Pulse: 64 72 72 68  Resp: 16 13 15    Temp:  (!) 97.5 F (36.4 C) 98.1 F (36.7 C) (!) 97.5 F (36.4 C)  TempSrc:   Oral Oral  SpO2: 100% 99% 99% 100%  Weight:      Height:        Intake/Output Summary (Last 24 hours) at 12/02/2019 1817 Last data filed at 12/02/2019 1412 Gross per 24 hour  Intake 4003.57 ml  Output 1060 ml  Net 2943.57 ml   Filed Weights   11/30/19 0423 11/30/19 2347 12/02/19 0446  Weight: 78.1 kg 78.7 kg 78.6 kg    Data Reviewed: I have personally reviewed and interpreted daily labs, tele strips, imagings as discussed above. I reviewed all nursing notes, pharmacy notes, vitals, pertinent old records I have discussed plan of care as described  above with RN and patient/family.  CBC: Recent Labs  Lab 11/28/19 0752 11/29/19 0517 11/30/19 0502 12/01/19 0324 12/02/19 0543  WBC 10.8* 15.3* 14.2* 13.8* 9.8  HGB 13.2 10.8* 9.2* 9.2* 8.1*  HCT 39.2 32.7* 27.9* 28.1* 23.9*  MCV 90.5 92.1 91.8 92.4 90.5  PLT 414* 345 302 305 350   Basic  Metabolic Panel: Recent Labs  Lab 11/28/19 0752 11/29/19 0517 11/30/19 0502 12/01/19 0324 12/02/19 0543  NA 136 136 136 136 138  K 4.0 4.1 3.7 3.6 3.5  CL 102 102 103 104 104  CO2 24 24 23 25 25   GLUCOSE 111* 130* 100* 118* 114*  BUN 8 9 9 11 8   CREATININE 0.69 0.78 0.83 0.85 0.71  CALCIUM 9.1 8.4* 8.2* 8.3* 8.1*  MG 1.9 1.8 1.8 2.0 1.9    Studies: DG Foot 2 Views Right  Result Date: 12/02/2019 CLINICAL DATA:  Status post fifth toe amputation. EXAM: RIGHT FOOT - 2 VIEW COMPARISON:  12/01/2019 FINDINGS: Two views study shows fifth toe amputation at the level of the mid metatarsal. Overlying bandage material evident. No worrisome lytic or sclerotic osseous abnormality. IMPRESSION: Status post little toe amputation at the level of the mid metatarsal. Electronically Signed   By: 12/04/2019 M.D.   On: 12/02/2019 17:21   DG Foot 2 Views Right  Result Date: 12/01/2019 CLINICAL DATA:  Right foot wound EXAM: RIGHT FOOT - 2 VIEW COMPARISON:  None. FINDINGS: Frontal and lateral views of the right foot demonstrate no displaced fractures. Alignment is anatomic. Joint spaces are well preserved. Soft tissues are normal. IMPRESSION: 1. Unremarkable right foot. Electronically Signed   By: 12/04/2019 M.D.   On: 12/01/2019 20:52    Scheduled Meds: . [START ON 12/03/2019] apixaban  5 mg Oral BID  . [START ON 12/03/2019] aspirin EC  81 mg Oral Daily  . atorvastatin  10 mg Oral q1800  . Chlorhexidine Gluconate Cloth  6 each Topical Daily  . feeding supplement (ENSURE ENLIVE)  237 mL Oral TID BM  . multivitamin with minerals  1 tablet Oral Daily  . nicotine  21 mg Transdermal Daily  . senna-docusate  1 tablet Oral BID   Continuous Infusions: . sodium chloride 0 mL/hr at 12/01/19 2259  . vancomycin 1,250 mg (12/02/19 0905)   PRN Meds: sodium chloride, acetaminophen **OR** acetaminophen, LORazepam **OR** LORazepam, morphine injection, ondansetron **OR** ondansetron (ZOFRAN) IV, oxyCODONE,  oxyCODONE, zolpidem  2260, MD [facility] 12/02/2019 6:17 PM  To reach On-call, see care teams to locate the attending and reach out to them via [email or web address]. If 7PM-7AM, please contact night-coverage If you still have difficulty reaching the attending provider, please page the [facility] (Director on Call) for [facility] on amion for assistance.

## 2019-12-02 NOTE — H&P (Signed)
HISTORY AND PHYSICAL INTERVAL NOTE:  12/02/2019  11:44 AM  Brendan Williams  has presented today for surgery, with the diagnosis of RIGHT 5TH TOE GANGRENE.  The various methods of treatment have been discussed with the patient.  No guarantees were given.  After consideration of risks, benefits and other options for treatment, the patient has consented to surgery.  I have reviewed the patients' chart and labs.   PROCEDURE: RIGHT PARTIAL FIFTH RAY AMPUTATION   A history and physical examination was performed in my office.  The patient was reexamined.  There have been no changes to this history and physical examination.  Rosetta Posner, DPM

## 2019-12-02 NOTE — Op Note (Addendum)
PODIATRY / FOOT AND ANKLE SURGERY OPERATIVE REPORT    SURGEON: Caroline More, DPM  PRE-OPERATIVE DIAGNOSIS:  1.  Right fifth ray gangrene 2.  Right foot cellulitis extending up to the ankle 3.  PVD status post revascularization right lower extremity. 4.  Chronic tobacco abuse 5.  Diabetes type 2 with polyneuropathy, uncontrolled, last A1c 6%  POST-OPERATIVE DIAGNOSIS: Same  PROCEDURE(S): 1. Right partial fifth ray amputation  HEMOSTASIS: No tourniquet  ANESTHESIA: MAC  ESTIMATED BLOOD LOSS: 50 cc  FINDING(S): 1.  Right fifth toe gangrene to the level of bone and extending proximally to the fifth metatarsal phalangeal joint.  Appears to have healthy bleeding tissue at the level of the fifth metatarsal phalangeal joint but still questionable tissue at the lateral aspect of the fourth toe/fourth metatarsal phalangeal joint.  PATHOLOGY/SPECIMEN(S): Pathologic specimen right fifth ray partial, culture right fifth toe  INDICATIONS:   Brendan Williams is a 59 y.o. male who presents with a chronic gangrenous right fifth ray.  Patient presented to the hospital due to this and subsequent cellulitis that had developed.  During admission patient had revascularization of the right lower extremity and podiatry team was consulted for potential amputation after.  Discussed all treatment options the patient both conservative and surgical attempts at correction include potential risks and complications of surgical intervention at this time patient has elected for right partial fifth ray amputation.  Discussed postoperative course in detail prior to the procedure.  Discussed that due to patient's vascular status and history of diabetes as well as smoking history that healing any type of amputation will be difficult due to his comorbidities.  DESCRIPTION: After obtaining full informed written consent, the patient was brought back to the operating room and placed supine upon the operating table.  The  patient received IV antibiotics prior to induction.  After obtaining adequate anesthesia, 20 cc of half percent Marcaine plain was injected about the right fifth ray in a reverse Mayo type block.  The patient was prepped and draped in the standard fashion.  Attention was then directed to the right fifth ray where a racquet type incision was made at the dorsal medial aspect of the distal shaft of the fifth metatarsal extending distally and wrapping around the base of the proximal phalanx of the fifth toe where somewhat healthy tissue remained.  This incision was made straight to bone.  An extensor tenotomy and capsulotomy was performed followed by release the collateral and suspensory ligaments and any attachments to the plantar plate and flexor tendon.  The fifth toe was disarticulated and passed off the operative site and sent off for culture and pathologic specimen was also obtained.  Circumferential dissection was then continued around the fifth metatarsal head and distal shaft to the midshaft portion.  A sagittal bone saw was then used to resect the distal shaft and head of the fifth metatarsal which was passed off the operative site and sent off along with a pathologic specimen.  All tendinous debris and plantar plate was resected and passed off the operative site.  Any bleeding vessels were cauterized at that time.  Overall the tissues appeared to have fairly minimal bleeding overall concerning for potential healing issues in the future.  A small area continued to bleed at the distal aspect of the amputation site despite Bovie cauterization.  A flush was performed with copious amounts normal sterile saline.  At this time a small amount of Surgicel was applied to the wound in this area.  Hemostasis  appeared to be well achieved.  The deep tissues were then reapproximated well coapted with 3-0 Vicryl, the subcutaneous tissues were approximated well coapted with 3-0 Vicryl and the skin was then reapproximated  well coapted with 3-0 nylon combination of simple, horizontal mattress, vertical mattress type stitching.  10 cc of half percent Marcaine plain was injected about the operative site.  Xeroform was then applied to the incision site followed by 4 x 4 gauze, ABD, Kerlix, Ace wrap.  The patient tolerated the procedure and anesthesia well was transferred to recovery room vital signs stable vascular status intact to the remainder of the right foot.  Following appear to postoperative monitoring the patient be discharged back to the inpatient room with the following written oral postop instructions: Keep surgical dressings clean, dry, and intact, elevate left lower extremity when at rest, take postoperative pain medication and antibiotics as prescribed, maintain partial weightbearing with heel contact only for transfers in a surgical shoe otherwise try to stay off the foot as much possible.  PT consultation placed.  Appreciate recommendations for IV antibiotics per infectious disease.  Likely to transition to oral for discharge.  COMPLICATIONS: None  CONDITION: Good, stable  Caroline More, DPM

## 2019-12-02 NOTE — Progress Notes (Signed)
Nutrition Follow Up Note   DOCUMENTATION CODES:   Not applicable  INTERVENTION:   Ensure Enlive po TID, each supplement provides 350 kcal and 20 grams of protein  MVI daily   NUTRITION DIAGNOSIS:   Moderate Malnutrition related to poor appetite as evidenced by mild to moderate fat depletions, moderate  to severe muscle depletions.  GOAL:   Patient will meet greater than or equal to 90% of their needs  -progressing   MONITOR:   PO intake, Supplement acceptance, Labs, Weight trends, Skin, I & O's  ASSESSMENT:   59 year old male with known history of peripheral artery disease s/p endovascular intervention 1/29 now admitted with disabling claudication symptoms on the left   Pt s/p right partial fifth ray amputation today  Pt with fairly good appetite and oral intake in hospital; pt eating 50-100% of meals and drinking some Ensure supplements. Pt NPO today for procedure. Recommend continue supplements and vitamins after discharge. Per chart, pt is weight stable since admit.   Medications reviewed and include: MVI, nicotine, senokot, zosyn, vancomycin  Labs reviewed: Hgb 8.1(L), Hct 23.9(L)  Diet Order:   Diet Order    None     EDUCATION NEEDS:   Education needs have been addressed  Skin:  Skin Assessment: Reviewed RN Assessment  Last BM:  2/12- constipation  Height:   Ht Readings from Last 1 Encounters:  11/27/19 6' (1.829 m)    Weight:   Wt Readings from Last 1 Encounters:  12/02/19 78.6 kg    Ideal Body Weight:  80.9 kg  BMI:  Body mass index is 23.5 kg/m.  Estimated Nutritional Needs:   Kcal:  2100-2400kcal/day  Protein:  105-120g/day  Fluid:  >2.3L/day  Betsey Holiday MS, RD, LDN Contact information available in Amion

## 2019-12-02 NOTE — Progress Notes (Signed)
PT Cancellation Note  Patient Details Name: Brendan Williams MRN: [id number] DOB: May 29, 1961   Cancelled Treatment:    Reason Eval/Treat Not Completed: Other (comment). Pt with pending surgery this date for 5th toe amputation. Will need new PT orders when medically stable to participate including updated weightbearing status. Will discontinue orders at this time. Thank you.   [name] 12/02/2019, 8:38 AM  Elizabeth Palau, PT, DPT [phone]

## 2019-12-02 NOTE — Consult Note (Signed)
Pharmacy Antibiotic Note  Brendan Williams is a 59 y.o. male admitted on 11/25/2019 with sepsis.  Pharmacy has been consulted for unasyn and vancomycin dosing. Patient received 4 days of Zosyn. Per ID physician, will change from Zosyn to Unasyn.   Plan: 1) Unasyn 3g IV Q6 hours.  2) Will give a vancomycin 1750 mg x 1 loading dose, followed by vancomycin 1250 mg q12H maintenance dose. Predicted AUC 484 and goal AUC is 400-550. Scr used 0.83.   Height: 6' (182.9 cm) Weight: 173 lb 4.5 oz (78.6 kg) IBW/kg (Calculated) : 77.6  Temp (24hrs), Avg:98.2 F (36.8 C), Min:97.2 F (36.2 C), Max:99.1 F (37.3 C)  Recent Labs  Lab 11/28/19 0752 11/29/19 0517 11/30/19 0502 12/01/19 0324 12/02/19 0543 12/02/19 0619 12/02/19 0913  WBC 10.8* 15.3* 14.2* 13.8* 9.8  --   --   CREATININE 0.69 0.78 0.83 0.85 0.71  --   --   LATICACIDVEN  --   --   --   --   --  1.1 1.0    Estimated Creatinine Clearance: 109.1 mL/min (by C-G formula based on SCr of 0.71 mg/dL).    No Known Allergies  Antimicrobials this admission: 2/16 Unasyn >> 2/13 pip/tazo >> 2/16 2/13 vancomycin >>   Dose adjustments this admission: None  Microbiology results:  2/13 Bcx: Staph Species(coagulase negative) 2/9 MRSA PCR: negative  Thank you for allowing pharmacy to be a part of this patient's care.  Bettey Costa, PharmD Clinical Pharmacist 12/02/2019 6:26 PM

## 2019-12-02 NOTE — TOC Initial Note (Signed)
Transition of Care [facility]) - Initial/Assessment Note    Patient Details  Name: Brendan Williams MRN: [id number] Date of Birth: 08/12/1961  [facility] CM/SW Contact:    Su Hilt, RN Phone Number: 12/02/2019, 10:00 AM  Clinical Narrative:                 Met with the patient and his wife in the room to discuss DC plan and needs He will need a RW thru [place], I notified Brad with [facility] I notified Cassie with [facility] that the patient needs HH PT thru charity as it is their week The patient will get his medications other than Narcotics from Medication Management He has a PCP in place   Expected Discharge Plan: [place] Barriers to Discharge: Continued Medical Work up   Patient Goals and CMS Choice Patient states their goals for this hospitalization and ongoing recovery are:: go home      Expected Discharge Plan and Services Expected Discharge Plan: [place]   Discharge Planning Services: CM Consult, Medication Assistance, Other - See comment([facility] services)   Living arrangements for the past 2 months: Single Family Home                 DME Arranged: [place] rolling DME Agency: [facility] Date DME Agency Contacted: 12/02/19 Time DME Agency Contacted: [phone] Representative spoke with at DME Agency: [place] Arranged: PT([facility]) [place] Agency: [place] Date [place]: 12/02/19 Time [place]: [phone] Representative spoke with at [place]: Cassie  Prior Living Arrangements/Services Living arrangements for the past 2 months: [place] with:: Spouse Patient language and need for interpreter reviewed:: Yes Do you feel safe going back to the place where you live?: Yes      Need for Family Participation in Patient Care: No (Comment) Care giver support system in place?: Yes (comment)   Criminal Activity/Legal Involvement Pertinent to Current Situation/Hospitalization: No  - Comment as needed  Activities of Daily Living Home Assistive Devices/Equipment: None ADL Screening (condition at time of admission) Patient's cognitive ability adequate to safely complete daily activities?: Yes Is the patient deaf or have difficulty hearing?: No Does the patient have difficulty seeing, even when wearing glasses/contacts?: No Does the patient have difficulty concentrating, remembering, or making decisions?: No Patient able to express need for assistance with ADLs?: Yes Does the patient have difficulty dressing or bathing?: No Independently performs ADLs?: Yes (appropriate for developmental age) Does the patient have difficulty walking or climbing stairs?: No Weakness of Legs: Both Weakness of Arms/Hands: None  Permission Sought/Granted   Permission granted to share information with : Yes, Verbal Permission Granted              Emotional Assessment Appearance:: Appears stated age Attitude/Demeanor/Rapport: Engaged Affect (typically observed): Appropriate Orientation: : Oriented to Self, Oriented to Situation, Oriented to Place, Oriented to  Time Alcohol / Substance Use: Not Applicable Psych Involvement: No (comment)  Admission diagnosis:  PAD (peripheral artery disease) ([place]) [I73.9] Patient Active Problem List   Diagnosis Date Noted  . Atherosclerosis of native arteries of extremity with rest pain ([place]) 11/25/2019  . PAD (peripheral artery disease) ([place]) 11/25/2019  . Tobacco use disorder 11/11/2019  . Atherosclerosis of native arteries of extremity with intermittent claudication ([place]) 11/11/2019   PCP:  Rutherford Limerick, PA Pharmacy:   [id number] [place], [place] - [address]  [place] [address] Phone: [phone] Fax: [phone]  [facility] [place], [place] [address] [place] [address] Phone: [phone] Fax: [phone]     Social Determinants of Health (SDOH)  Interventions    Readmission Risk Interventions No flowsheet data found.

## 2019-12-02 NOTE — Progress Notes (Signed)
OT Cancellation Note  Patient Details Name: Brendan Williams MRN: [id number] DOB: 1961/02/08   Cancelled Treatment:    Reason Eval/Treat Not Completed: Other (comment)  Pt with R 5th toe gangrene and now undergoing surgery this date for 5th ray amputation. Will need new OT orders including updated weightbearing status when he becomes stable after procedure. Will discontinue orders at this time. Thank you.  Rejeana Brock, MS, OTR/L ascom [phone] 12/02/19, 1:21 PM

## 2019-12-02 NOTE — Transfer of Care (Signed)
Immediate Anesthesia Transfer of Care Note  Patient: Brendan Williams  Procedure(s) Performed: AMPUTATION RIGHT 5TH TOE (Right Toe)  Patient Location: PACU  Anesthesia Type:General  Level of Consciousness: awake, alert  and oriented  Airway & Oxygen Therapy: Patient Spontanous Breathing  Post-op Assessment: Report given to RN and Post -op Vital signs reviewed and stable  Post vital signs: Reviewed and stable  Last Vitals:  Vitals Value Taken Time  BP 98/70 12/02/19 1340  Temp 36.2 C 12/02/19 1336  Pulse 77 12/02/19 1342  Resp 16 12/02/19 1342  SpO2 97 % 12/02/19 1342  Vitals shown include unvalidated device data.  Last Pain:  Vitals:   12/02/19 1336  TempSrc:   PainSc: 0-No pain      Patients Stated Pain Goal: 0 (12/02/19 0900)  Complications: No apparent anesthesia complications

## 2019-12-02 NOTE — Consult Note (Signed)
NAME: Brendan Williams  DOB: 30-Sep-1961  MRN: [id number]  Date/Time: 12/02/2019 4:39 PM  REQUESTING PROVIDER: Dr. Excell Seltzer Subjective:  REASON FOR CONSULT: Right foot infection ? Brendan Williams is a 59 y.o. male with a history of severe peripheral artery disease, lifelong smoker, is admitted on 11/25/2019 because of disabling rest pain to the right foot and bluish discoloration of the right fifth toe.  He is followed by vascular surgeon and had undergone aortoiliac intervention on 11/14/2019 He had angioplasty of the left external iliac artery, stent placements to bilateral common iliac artery,  to the right external iliac artery,to the left common and external iliac artery and angioplasty of the right common femoral artery.   Pt says  3 days  after that started having foot pain and then noted bluish discoloration of the toe    On 11/28/2019 he had extensive vascular intervention with bilateral endarterectomies of the femoral, profunda femoris and superficial femoral arteries.  He also had embolectomy balloon to the right and left iliac arteries.,  And extensive stents placement to the common iliac arteries and right iliac artery and left iliac arteries and to the aorta.  Also underwent angioplasty of the right anterior tibial artery and a stent placement to the right SFA and proximal popliteal artery.  He also had a stent placement to the left SFA.  Postprocedure he developed agitation  He also had low-grade fever and hence blood cultures were sent.   He underwent rt fifth ray excision by Dr.Baker on 12/02/19 and I am asked to see the patient for antibiotic recommendation No fever in the past 48 hrs and patient is feeling better- wife at bed side      Past Medical History:  Diagnosis Date  . Peripheral arterial disease [facility])     Past Surgical History:  Procedure Laterality Date  . APPLICATION OF WOUND VAC Bilateral 11/28/2019   Procedure: APPLICATION OF WOUND VAC;  Surgeon: Annice Needy, MD;   Location: [facility] ORS;  Service: Vascular;  Laterality: Bilateral;  Prevena   . BACK SURGERY     lower ruptured disk  . CENTRAL VENOUS CATHETER INSERTION  11/28/2019   Procedure: INSERTION CENTRAL LINE ADULT;  Surgeon: Annice Needy, MD;  Location: [facility] ORS;  Service: Vascular;;  . ENDARTERECTOMY FEMORAL Bilateral 11/28/2019   Procedure: ENDARTERECTOMY FEMORAL;  Surgeon: Annice Needy, MD;  Location: [facility] ORS;  Service: Vascular;  Laterality: Bilateral;  . LOWER EXTREMITY ANGIOGRAPHY Right 11/14/2019   Procedure: LOWER EXTREMITY ANGIOGRAPHY;  Surgeon: Annice Needy, MD;  Location: [facility] INVASIVE CV LAB;  Service: Cardiovascular;  Laterality: Right;  . STOMACH SURGERY    . TONSILECTOMY, ADENOIDECTOMY, BILATERAL MYRINGOTOMY AND TUBES      Social History   Socioeconomic History  . Marital status: Married    Spouse name: Not on file  . Number of children: Not on file  . Years of education: Not on file  . Highest education level: Not on file  Occupational History  . Not on file  Tobacco Use  . Smoking status: Current Every Day Smoker    Packs/day: 1.00    Types: Cigarettes  . Smokeless tobacco: Never Used  Substance and Sexual Activity  . Alcohol use: Yes    Alcohol/week: 5.0 standard drinks    Types: 5 Cans of beer per week    Comment: 4-5 beers a week  . Drug use: Yes    Frequency: 3.0 times per week    Types: Marijuana  Comment: 3-4 times a week  . Sexual activity: Yes  Other Topics Concern  . Not on file  Social History Narrative   Live in private residence with wife   Social Determinants of Health   Financial Resource Strain:   . Difficulty of Paying Living Expenses: Not on file  Food Insecurity:   . Worried About Charity fundraiser in the Last Year: Not on file  . Ran Out of Food in the Last Year: Not on file  Transportation Needs:   . Lack of Transportation (Medical): Not on file  . Lack of Transportation (Non-Medical): Not on file  Physical Activity:   . Days of  Exercise per Week: Not on file  . Minutes of Exercise per Session: Not on file  Stress:   . Feeling of Stress : Not on file  Social Connections:   . Frequency of Communication with Friends and Family: Not on file  . Frequency of Social Gatherings with Friends and Family: Not on file  . Attends Religious Services: Not on file  . Active Member of Clubs or Organizations: Not on file  . Attends Archivist Meetings: Not on file  . Marital Status: Not on file  Intimate Partner Violence:   . Fear of Current or Ex-Partner: Not on file  . Emotionally Abused: Not on file  . Physically Abused: Not on file  . Sexually Abused: Not on file    Family History  Problem Relation Age of Onset  . Leukemia Mother   . Heart disease Father        a. CABG  . Heart disease Sister        a. MVR  . Cancer Sister   . Cancer Brother   . CAD Brother        a. CABG   No Known Allergies  ? Current Facility-Administered Medications  Medication Dose Route Frequency Provider Last Rate Last Admin  . 0.9 %  sodium chloride infusion   Intravenous PRN Ottie Glazier, MD 0 mL/hr at 12/01/19 2259 Continued from Pre-op at 12/02/19 1237  . acetaminophen (TYLENOL) tablet 650 mg  650 mg Oral Q6H PRN Algernon Huxley, MD   650 mg at 11/30/19 1725   Or  . acetaminophen (TYLENOL) suppository 650 mg  650 mg Rectal Q6H PRN Algernon Huxley, MD      . Derrill Memo ON 12/03/2019] apixaban (ELIQUIS) tablet 5 mg  5 mg Oral BID Enzo Bi, MD      . Derrill Memo ON 12/03/2019] aspirin EC tablet 81 mg  81 mg Oral Daily Enzo Bi, MD      . atorvastatin (LIPITOR) tablet 10 mg  10 mg Oral J0932 Algernon Huxley, MD   10 mg at 12/01/19 1705  . Chlorhexidine Gluconate Cloth 2 % PADS 6 each  6 each Topical Daily Algernon Huxley, MD   6 each at 12/01/19 0910  . feeding supplement (ENSURE ENLIVE) (ENSURE ENLIVE) liquid 237 mL  237 mL Oral TID BM Algernon Huxley, MD   237 mL at 12/01/19 2130  . LORazepam (ATIVAN) tablet 1-4 mg  1-4 mg Oral Q1H PRN Ottie Glazier, MD       Or  . LORazepam (ATIVAN) injection 1-4 mg  1-4 mg Intravenous Q1H PRN Ottie Glazier, MD      . morphine 2 MG/ML injection 2 mg  2 mg Intravenous Q2H PRN Algernon Huxley, MD   2 mg at 12/02/19 0900  . multivitamin with  minerals tablet 1 tablet  1 tablet Oral Daily Annice Needy, MD   1 tablet at 12/01/19 0908  . nicotine (NICODERM CQ - dosed in mg/24 hours) patch 21 mg  21 mg Transdermal Daily Annice Needy, MD   21 mg at 11/30/19 0908  . ondansetron (ZOFRAN) tablet 4 mg  4 mg Oral Q6H PRN Annice Needy, MD       Or  . ondansetron (ZOFRAN) injection 4 mg  4 mg Intravenous Q6H PRN Annice Needy, MD      . oxyCODONE (Oxy IR/ROXICODONE) immediate release tablet 10 mg  10 mg Oral Q4H PRN Annice Needy, MD   10 mg at 12/01/19 2301  . oxyCODONE (Oxy IR/ROXICODONE) immediate release tablet 5 mg  5 mg Oral Q4H PRN Annice Needy, MD   5 mg at 11/28/19 1954  . piperacillin-tazobactam (ZOSYN) IVPB 3.375 g  3.375 g Intravenous Q8H Ronnald Ramp, RPH 12.5 mL/hr at 12/02/19 0559 3.375 g at 12/02/19 0559  . senna-docusate (Senokot-S) tablet 1 tablet  1 tablet Oral BID Rolly Salter, MD   1 tablet at 12/01/19 2125  . vancomycin (VANCOREADY) IVPB 1250 mg/250 mL  1,250 mg Intravenous Q12H Ronnald Ramp, RPH 166.7 mL/hr at 12/02/19 0905 1,250 mg at 12/02/19 0998  . zolpidem (AMBIEN) tablet 5 mg  5 mg Oral QHS PRN Rolly Salter, MD   5 mg at 12/01/19 2301     Abtx:  Anti-infectives (From admission, onward)   Start     Dose/Rate Route Frequency Ordered Stop   11/29/19 2200  vancomycin (VANCOREADY) IVPB 1250 mg/250 mL     1,250 mg 166.7 mL/hr over 90 Minutes Intravenous Every 12 hours 11/29/19 1118     11/29/19 1130  piperacillin-tazobactam (ZOSYN) IVPB 3.375 g     3.375 g 12.5 mL/hr over 240 Minutes Intravenous Every 8 hours 11/29/19 1118     11/29/19 1130  vancomycin (VANCOREADY) IVPB 1750 mg/350 mL     1,750 mg 175 mL/hr over 120 Minutes Intravenous  Once 11/29/19 1118 11/29/19 1452       REVIEW OF SYSTEMS:  Const:  fever, negative chills, negative weight loss Eyes: negative diplopia or visual changes, negative eye pain ENT: negative coryza, negative sore throat Resp: ++ cough, hemoptysis, dyspnea Cards: negative for chest pain, palpitations, lower extremity edema GU: negative for frequency, dysuria and hematuria GI: Negative for abdominal pain, diarrhea, bleeding, constipation Skin: negative for rash and pruritus Heme: negative for easy bruising and gum/nose bleeding MS: rt leg achiness and now rt foot pain Neurolo:negative for headaches, dizziness, vertigo, memory problems  Psych: negative for feelings of anxiety, depression  Endocrine: negative for thyroid, diabetes Allergy/Immunology- negative for any medication or food allergies ?  Objective:  VITALS:  BP 107/74 (BP Location: Left Arm)   Pulse 68   Temp (!) 97.5 F (36.4 C) (Oral)   Resp 15   Ht 6' (1.829 m)   Wt 78.6 kg   SpO2 100%   BMI 23.50 kg/m  PHYSICAL EXAM:  General: Alert, cooperative, no distress, appears stated age.  Head: Normocephalic, without obvious abnormality, atraumatic. Eyes: Conjunctivae clear, anicteric sclerae. Pupils are equal ENT Nares normal. No drainage or sinus tenderness. Lips, mucosa, and tongue normal. No Thrush-poor dentition Neck: Supple, symmetrical, no adenopathy, thyroid: non tender no carotid bruit and no JVD. Back: No CVA tenderness. Lungs: Clear to auscultation bilaterally. No Wheezing or Rhonchi. No rales. Heart: Regular rate and rhythm, no murmur,  rub or gallop. Abdomen: Soft, non-tender,not distended. Bowel sounds normal. No masses B/l inguinal surgical site- with staples- clean, no erythema or discharge Extremities: rt leg surgical dressing not removed  Reviewed picture that wife had taken before surgery     Skin: No rashes or lesions. Or bruising Lymph: Cervical, supraclavicular normal. Neurologic: Grossly non-focal Pertinent Labs Lab  Results CBC    Component Value Date/Time   WBC 9.8 12/02/2019 0543   RBC 2.64 (L) 12/02/2019 0543   HGB 8.1 (L) 12/02/2019 0543   HCT 23.9 (L) 12/02/2019 0543   PLT 350 12/02/2019 0543   MCV 90.5 12/02/2019 0543   MCH 30.7 12/02/2019 0543   MCHC 33.9 12/02/2019 0543   RDW 13.1 12/02/2019 0543    CMP Latest Ref Rng & Units 12/02/2019 12/01/2019 11/30/2019  Glucose 70 - 99 mg/dL 829(H) 371(I) 967(E)  BUN 6 - 20 mg/dL 8 11 9   Creatinine 0.61 - 1.24 mg/dL 9.38 1.01  Sodium 135 - 145 mmol/L 138 136 136  Potassium 3.5 - 5.1 mmol/L 3.5 3.6 3.7  Chloride 98 - 111 mmol/L 104 104 103  CO2 22 - 32 mmol/L 25 25 23   Calcium 8.9 - 10.3 mg/dL 8.1(L) 8.3(L) 8.2(L)      Microbiology: Recent Results (from the past 240 hour(s))  Surgical PCR screen     Status: None   Collection Time: 11/25/19 11:30 PM   Specimen: Nasal Mucosa; Nasal Swab  Result Value Ref Range Status   MRSA, PCR NEGATIVE NEGATIVE Final   Staphylococcus aureus NEGATIVE NEGATIVE Final    Comment: (NOTE) The Xpert SA Assay (FDA approved for NASAL specimens in patients 48 years of age and older), is one component of a comprehensive surveillance program. It is not intended to diagnose infection nor to guide or monitor treatment. Performed at [facility], [address] [place], [address] [place]   Respiratory Panel by RT PCR (Flu A&B, Covid) - Nasopharyngeal Swab     Status: None   Collection Time: 11/28/19 11:18 AM   Specimen: Nasopharyngeal Swab  Result Value Ref Range Status   SARS Coronavirus 2 by RT PCR NEGATIVE NEGATIVE Final    Comment: (NOTE) SARS-CoV-2 target nucleic acids are NOT DETECTED. The SARS-CoV-2 RNA is generally detectable in upper respiratoy specimens during the acute phase of infection. The lowest concentration of SARS-CoV-2 viral copies this assay can detect is 131 copies/mL. A negative result does not preclude SARS-Cov-2 infection and should not be used as the sole basis for  treatment or other patient management decisions. A negative result may occur with  improper specimen collection/handling, submission of specimen other than nasopharyngeal swab, presence of viral mutation(s) within the areas targeted by this assay, and inadequate number of viral copies (<131 copies/mL). A negative result must be combined with clinical observations, patient history, and epidemiological information. The expected result is Negative. Fact Sheet for Patients:  02585 Fact Sheet for Healthcare Providers:  01/26/20 This test is not yet ap proved or cleared by the https://www.moore.com/ FDA and  has been authorized for detection and/or diagnosis of SARS-CoV-2 by FDA under an Emergency Use Authorization (EUA). This EUA will remain  in effect (meaning this test can be used) for the duration of the COVID-19 declaration under Section 564(b)(1) of the Act, 21 U.S.C. section 360bbb-3(b)(1), unless the authorization is terminated or revoked sooner.    Influenza A by PCR NEGATIVE NEGATIVE Final   Influenza B by PCR NEGATIVE NEGATIVE Final    Comment: (NOTE)  The Xpert Xpress SARS-CoV-2/FLU/RSV assay is intended as an aid in  the diagnosis of influenza from Nasopharyngeal swab specimens and  should not be used as a sole basis for treatment. Nasal washings and  aspirates are unacceptable for Xpert Xpress SARS-CoV-2/FLU/RSV  testing. Fact Sheet for Patients: https://www.moore.com/https://www.fda.gov/media/142436/download Fact Sheet for Healthcare Providers: https://www.young.biz/https://www.fda.gov/media/142435/download This test is not yet approved or cleared by the [place] FDA and  has been authorized for detection and/or diagnosis of SARS-CoV-2 by  FDA under an Emergency Use Authorization (EUA). This EUA will remain  in effect (meaning this test can be used) for the duration of the  Covid-19 declaration under Section 564(b)(1) of the Act, 21  U.S.C. section  360bbb-3(b)(1), unless the authorization is  terminated or revoked. Performed at [facility], [address] [place], [place] [address]   Respiratory Panel by PCR     Status: None   Collection Time: 11/29/19 11:56 AM   Specimen: Nasopharyngeal Swab; Respiratory  Result Value Ref Range Status   Adenovirus NOT DETECTED NOT DETECTED Final   Coronavirus 229E NOT DETECTED NOT DETECTED Final    Comment: (NOTE) The Coronavirus on the Respiratory Panel, DOES NOT test for the novel  Coronavirus (2019 nCoV)    Coronavirus HKU1 NOT DETECTED NOT DETECTED Final   Coronavirus NL63 NOT DETECTED NOT DETECTED Final   Coronavirus OC43 NOT DETECTED NOT DETECTED Final   Metapneumovirus NOT DETECTED NOT DETECTED Final   Rhinovirus / Enterovirus NOT DETECTED NOT DETECTED Final   Influenza A NOT DETECTED NOT DETECTED Final   Influenza B NOT DETECTED NOT DETECTED Final   Parainfluenza Virus 1 NOT DETECTED NOT DETECTED Final   Parainfluenza Virus 2 NOT DETECTED NOT DETECTED Final   Parainfluenza Virus 3 NOT DETECTED NOT DETECTED Final   Parainfluenza Virus 4 NOT DETECTED NOT DETECTED Final   Respiratory Syncytial Virus NOT DETECTED NOT DETECTED Final   Bordetella pertussis NOT DETECTED NOT DETECTED Final   Chlamydophila pneumoniae NOT DETECTED NOT DETECTED Final   Mycoplasma pneumoniae NOT DETECTED NOT DETECTED Final    Comment: Performed at [facility], [address] [place], [place] [address]  Expectorated sputum assessment w rflx to resp cult     Status: None   Collection Time: 11/29/19 11:56 AM   Specimen: Sputum  Result Value Ref Range Status   Specimen Description SPUTUM  Final   Special Requests NONE  Final   Sputum evaluation   Final    Sputum specimen not acceptable for testing.  Please recollect.   Performed at [facility], [address] [place], [place] [address]    Report Status 11/29/2019 FINAL  Final  CULTURE, BLOOD (ROUTINE X 2) w Reflex to ID Panel      Status: None (Preliminary result)   Collection Time: 11/29/19 12:40 PM   Specimen: BLOOD  Result Value Ref Range Status   Specimen Description BLOOD LEFT ANTECUBITAL  Final   Special Requests   Final    BOTTLES DRAWN AEROBIC AND ANAEROBIC Blood Culture results may not be optimal due to an excessive volume of blood received in culture bottles   Culture   Final    NO GROWTH 3 DAYS Performed at [facility], [address] [place], [place] [address]    Report Status PENDING  Incomplete  CULTURE, BLOOD (ROUTINE X 2) w Reflex to ID Panel     Status: Abnormal   Collection Time: 11/29/19 12:40 PM   Specimen: BLOOD  Result Value  Ref Range Status   Specimen Description   Final    BLOOD BLOOD LEFT HAND Performed at [facility], [address] [place], [place] [address]    Special Requests   Final    BOTTLES DRAWN AEROBIC AND ANAEROBIC Blood Culture adequate volume Performed at [facility], [address] [place], [place] [address]    Culture  Setup Time   Final    GRAM POSITIVE COCCI AEROBIC BOTTLE ONLY CRITICAL RESULT CALLED TO, READ BACK BY AND VERIFIED WITH: JASON ROBBINS AT 1854 11/30/19.PMF    Culture (A)  Final    STAPHYLOCOCCUS SPECIES (COAGULASE NEGATIVE) THE SIGNIFICANCE OF ISOLATING THIS ORGANISM FROM A SINGLE SET OF BLOOD CULTURES WHEN MULTIPLE SETS ARE DRAWN IS UNCERTAIN. PLEASE NOTIFY THE MICROBIOLOGY DEPARTMENT WITHIN ONE WEEK IF SPECIATION AND SENSITIVITIES ARE REQUIRED. Performed at [facility], [address] [place], [place] [address]    Report Status 12/02/2019 FINAL  Final    IMAGING RESULTS: CXr atelectasisI have personally reviewed the films ? Impression/Recommendation ? 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 S/P ray excision of the fifth toe.  Currently on Zosyn. We will change to Unasyn.continue vanco for now Will evaluate the wound with Dr.BAker and decide whether oral antibiotics would suffice on  discharge  Anemia: Acute-- postprocedure  Severe peripheral arterial disease S/P revascularization procedure ? __Alcohol use with delirium tremens  Chronic smoker.  _________________________________________________ Discussed with patient,and wife

## 2019-12-02 NOTE — Anesthesia Preprocedure Evaluation (Addendum)
Anesthesia Evaluation  Patient identified by MRN, date of birth, ID band Patient awake    Reviewed: Allergy & Precautions, NPO status , Patient's Chart, lab work & pertinent test results  History of Anesthesia Complications Negative for: history of anesthetic complications  Airway Mallampati: III  TM Distance: >3 FB Neck ROM: Full    Dental  (+) Dental Advisory Given, Poor Dentition, Chipped, Missing   Pulmonary neg pulmonary ROS, neg sleep apnea, neg COPD, Current Smoker and Patient abstained from smoking.,    Pulmonary exam normal breath sounds clear to auscultation       Cardiovascular Exercise Tolerance: Good METS(-) hypertension+ Peripheral Vascular Disease  (-) CAD and (-) Past MI negative cardio ROS  (-) dysrhythmias  Rhythm:Regular Rate:Normal - Systolic murmurs    Neuro/Psych negative neurological ROS  negative psych ROS   GI/Hepatic neg GERD  ,(+)     substance abuse  alcohol use,   Endo/Other  neg diabetes  Renal/GU negative Renal ROS     Musculoskeletal   Abdominal   Peds  Hematology   Anesthesia Other Findings Past Medical History: No date: Peripheral arterial disease (HCC)  Past Surgical History: 11/28/2019: APPLICATION OF WOUND VAC; Bilateral     Comment:  Procedure: APPLICATION OF WOUND VAC;  Surgeon: Annice Needy, MD;  Location: [facility] ORS;  Service: Vascular;                Laterality: Bilateral;  Prevena  No date: BACK SURGERY     Comment:  lower ruptured disk 11/28/2019: CENTRAL VENOUS CATHETER INSERTION     Comment:  Procedure: INSERTION CENTRAL LINE ADULT;  Surgeon: Annice Needy, MD;  Location: [facility] ORS;  Service: Vascular;; 11/28/2019: ENDARTERECTOMY FEMORAL; Bilateral     Comment:  Procedure: ENDARTERECTOMY FEMORAL;  Surgeon: Annice Needy, MD;  Location: [facility] ORS;  Service: Vascular;                Laterality: Bilateral; 11/14/2019: LOWER  EXTREMITY ANGIOGRAPHY; Right     Comment:  Procedure: LOWER EXTREMITY ANGIOGRAPHY;  Surgeon: Annice Needy, MD;  Location: [facility] INVASIVE CV LAB;  Service:               Cardiovascular;  Laterality: Right; No date: STOMACH SURGERY No date: TONSILECTOMY, ADENOIDECTOMY, BILATERAL MYRINGOTOMY AND TUBES   Reproductive/Obstetrics                            Anesthesia Physical  Anesthesia Plan  ASA: III  Anesthesia Plan: General   Post-op Pain Management:    Induction: Intravenous  PONV Risk Score and Plan: 2 and Ondansetron, Dexamethasone and Midazolam  Airway Management Planned: Natural Airway and Nasal Cannula  Additional Equipment:   Intra-op Plan:   Post-operative Plan: Extubation in OR  Informed Consent: I have reviewed the patients History and Physical, chart, labs and discussed the procedure including the risks, benefits and alternatives for the proposed anesthesia with the patient or authorized representative who has indicated his/her understanding and acceptance.     Dental advisory given  Plan Discussed with: CRNA and Surgeon  Anesthesia Plan Comments: ( Patient and wife  consented for risks of anesthesia including but not limited to:  - adverse reactions to medications - risk of intubation if required - damage to teeth, lips or other oral mucosa - sore throat or hoarseness - Damage to heart, brain, lungs or loss of life  They voiced understanding)       Anesthesia Quick Evaluation

## 2019-12-02 NOTE — Progress Notes (Signed)
Patient BP 73/59 and HR 81. Brendan Williams Notified. She ordered bolus and tor reassess. Mews 2 Vital signs initiated.

## 2019-12-02 NOTE — Anesthesia Postprocedure Evaluation (Signed)
Anesthesia Post Note  Patient: Brendan Williams  Procedure(s) Performed: AMPUTATION RIGHT 5TH TOE (Right Toe)  Patient location during evaluation: PACU Anesthesia Type: General Level of consciousness: awake and alert and oriented Pain management: pain level controlled Vital Signs Assessment: post-procedure vital signs reviewed and stable Respiratory status: spontaneous breathing, nonlabored ventilation and respiratory function stable Cardiovascular status: blood pressure returned to baseline and stable Postop Assessment: no signs of nausea or vomiting Anesthetic complications: no     Last Vitals:  Vitals:   12/02/19 1408 12/02/19 1430  BP: 112/65 118/74  Pulse: 72 72  Resp: 13 15  Temp: (!) 36.4 C 36.7 C  SpO2: 99% 99%    Last Pain:  Vitals:   12/02/19 1430  TempSrc: Oral  PainSc:                  [name]

## 2019-12-03 DIAGNOSIS — I70222 Atherosclerosis of native arteries of extremities with rest pain, left leg: Secondary | ICD-10-CM

## 2019-12-03 DIAGNOSIS — Z720 Tobacco use: Secondary | ICD-10-CM

## 2019-12-03 DIAGNOSIS — B9561 Methicillin susceptible Staphylococcus aureus infection as the cause of diseases classified elsewhere: Secondary | ICD-10-CM

## 2019-12-03 DIAGNOSIS — I70261 Atherosclerosis of native arteries of extremities with gangrene, right leg: Secondary | ICD-10-CM

## 2019-12-03 DIAGNOSIS — L03031 Cellulitis of right toe: Secondary | ICD-10-CM

## 2019-12-03 DIAGNOSIS — R7881 Bacteremia: Secondary | ICD-10-CM

## 2019-12-03 LAB — VANCOMYCIN, TROUGH: Vancomycin Tr: 10 ug/mL — ABNORMAL LOW (ref 15–20)

## 2019-12-03 LAB — CBC
HCT: 23.3 % — ABNORMAL LOW (ref 39.0–52.0)
Hemoglobin: 7.6 g/dL — ABNORMAL LOW (ref 13.0–17.0)
MCH: 30.2 pg (ref 26.0–34.0)
MCHC: 32.6 g/dL (ref 30.0–36.0)
MCV: 92.5 fL (ref 80.0–100.0)
Platelets: 384 10*3/uL (ref 150–400)
RBC: 2.52 MIL/uL — ABNORMAL LOW (ref 4.22–5.81)
RDW: 13.1 % (ref 11.5–15.5)
WBC: 10.9 10*3/uL — ABNORMAL HIGH (ref 4.0–10.5)
nRBC: 0 % (ref 0.0–0.2)

## 2019-12-03 LAB — BASIC METABOLIC PANEL
Anion gap: 8 (ref 5–15)
BUN: 9 mg/dL (ref 6–20)
CO2: 24 mmol/L (ref 22–32)
Calcium: 8.1 mg/dL — ABNORMAL LOW (ref 8.9–10.3)
Chloride: 104 mmol/L (ref 98–111)
Creatinine, Ser: 0.61 mg/dL (ref 0.61–1.24)
GFR calc Af Amer: >60 mL/min (ref >60)
GFR calc non Af Amer: >60 mL/min (ref >60)
Glucose, Bld: 139 mg/dL — ABNORMAL HIGH (ref 70–99)
Potassium: 3.6 mmol/L (ref 3.5–5.1)
Sodium: 136 mmol/L (ref 135–145)

## 2019-12-03 LAB — VANCOMYCIN, PEAK: Vancomycin Pk: 29 ug/mL — ABNORMAL LOW (ref 30–40)

## 2019-12-03 LAB — MAGNESIUM: Magnesium: 1.9 mg/dL (ref 1.7–2.4)

## 2019-12-03 LAB — SURGICAL PATHOLOGY

## 2019-12-03 NOTE — Evaluation (Signed)
Physical Therapy RE-Evaluation Patient Details Name: Brendan Williams MRN: [id number] DOB: 17-Sep-1961 Today's Date: 12/03/2019   History of Present Illness  59 year old with a history of alcoholism, lifelong smoking, GERD does not follow with primary care doctor, came in with problems associated with his severe peripheral artery disease namely SFA and common femoral occlusion status post aorto iliac surgery on January 2021 when he was noted to have critical limb ischemia of his toes.  S/p b/l endartectomies/stent placement surgeries 2/12. Now s/p R 5th toe amputation on 12/02/19. WBAT in L LE and Heel WBing through R LE limited to transfers per Dr. Luana Shu.  Clinical Impression  Pt is a pleasant 59 year old male who was admitted for bilat endartectomies/stent placement on 2/12 and now R 5th toe amputation on 12/02/19. Re-evaluation performed this date s/p procedure yesterday. Pt performs bed mobility with supervision and transfers/ambulation with cga and RW. R LE WBing limited to heel transfers, however pt prefers to be NWB, as there is not a post op shoe in room. Pt demonstrates deficits with strength/mobility/pain. Would benefit from skilled PT to address above deficits and promote optimal return to PLOF. Recommend transition to [facility] upon discharge from acute hospitalization.     Follow Up Recommendations Home health PT;Supervision for mobility/OOB    Equipment Recommendations  (RW delievered to room at this time)    Recommendations for Other Services       Precautions / Restrictions Precautions Precautions: Fall Restrictions Weight Bearing Restrictions: Yes RLE Weight Bearing: Touchdown weight bearing LLE Weight Bearing: Weight bearing as tolerated      Mobility  Bed Mobility Overal bed mobility: Needs Assistance Bed Mobility: Supine to Sit     Supine to sit: Supervision     General bed mobility comments: Pt able to swing B LEs off bed and sit at EOB. Upright posture noted and  safe technique.   Transfers Overall transfer level: Needs assistance Equipment used: Rolling walker (2 wheeled) Transfers: Sit to/from Stand Sit to Stand: Min guard         General transfer comment: slightly impulsive and tries to get up prior to cues. Once standing, pt prefers R NWB.   Ambulation/Gait Ambulation/Gait assistance: Min guard Gait Distance (Feet): 3 Feet Assistive device: Rolling walker (2 wheeled) Gait Pattern/deviations: Step-to pattern     General Gait Details: hopped over a few steps to recliner maintaining R LE NWB. Safe technique. DUe to orders, limited to transfers at this time. Further ambulation deferred  Stairs            Wheelchair Mobility    Modified Rankin (Stroke Patients Only)       Balance Overall balance assessment: Needs assistance Sitting-balance support: Feet supported Sitting balance-Leahy Scale: Good     Standing balance support: Bilateral upper extremity supported Standing balance-Leahy Scale: Good                               Pertinent Vitals/Pain Pain Assessment: 0-10 Pain Score: 6  Pain Location: R LE foot/ankle Pain Descriptors / Indicators: Aching;Operative site guarding Pain Intervention(s): Limited activity within patient's tolerance    Home Living Family/patient expects to be discharged to:: Private residence Living Arrangements: Spouse/significant other;Other relatives Available Help at Discharge: Family;Available 24 hours/day Type of Home: House Home Access: Stairs to enter   [employer] of Steps: 3 Home Layout: One level Home Equipment: Cane - single point;Shower seat;Grab bars - tub/shower  Prior Function Level of Independence: Independent         Comments: Pt reports that he does not drive, but can be out in the community regularly, does not use ADs     Hand Dominance   Dominant Hand: Left    Extremity/Trunk Assessment   Upper Extremity Assessment Upper  Extremity Assessment: Overall WFL for tasks assessed    Lower Extremity Assessment Lower Extremity Assessment: Generalized weakness(L LE grossly 4/5; R LE grossly 3+/5)       Communication   Communication: No difficulties  Cognition Arousal/Alertness: Awake/alert Behavior During Therapy: WFL for tasks assessed/performed Overall Cognitive Status: Within Functional Limits for tasks assessed                                        General Comments      Exercises Other Exercises Other Exercises: seated ther-ex performed on B LE including AP and SLRs. All ther-ex performed x 10 reps with supervision and limited by pain   Assessment/Plan    PT Assessment Patient needs continued PT services  PT Problem List Decreased strength;Decreased range of motion;Decreased balance;Decreased activity tolerance;Decreased mobility;Decreased knowledge of use of DME;Decreased safety awareness;Cardiopulmonary status limiting activity       PT Treatment Interventions DME instruction;Gait training;Functional mobility training;Therapeutic activities;Therapeutic exercise;Balance training;Stair training;Neuromuscular re-education;Patient/family education    PT Goals (Current goals can be found in the Care Plan section)  Acute Rehab PT Goals Patient Stated Goal: Get up and maneuver around. PT Goal Formulation: With patient Time For Goal Achievement: 12/17/19 Potential to Achieve Goals: Good    Frequency Min 2X/week   Barriers to discharge        Co-evaluation               AM-PAC PT "6 Clicks" Mobility  Outcome Measure Help needed turning from your back to your side while in a flat bed without using bedrails?: None Help needed moving from lying on your back to sitting on the side of a flat bed without using bedrails?: None Help needed moving to and from a bed to a chair (including a wheelchair)?: A Little Help needed standing up from a chair using your arms (e.g., wheelchair  or bedside chair)?: A Little Help needed to walk in hospital room?: A Little Help needed climbing 3-5 steps with a railing? : A Lot 6 Click Score: 19    End of Session Equipment Utilized During Treatment: Gait belt Activity Tolerance: Patient limited by pain Patient left: in chair;with chair alarm set Nurse Communication: Mobility status PT Visit Diagnosis: Muscle weakness (generalized) (M62.81);Difficulty in walking, not elsewhere classified (R26.2);Unsteadiness on feet (R26.81);Ataxic gait (R26.0)    Time: 7342-8768 PT Time Calculation (min) (ACUTE ONLY): 20 min   Charges:   PT Evaluation $PT Re-evaluation: 1 Re-eval PT Treatments $Therapeutic Exercise: 8-22 mins        Elizabeth Palau, PT, DPT [phone]   Sumiko Ceasar 12/03/2019, 9:56 AM

## 2019-12-03 NOTE — Progress Notes (Signed)
ID  Doing better Says the pain foot is better    O/E BP 101/68 (BP Location: Left Arm)   Pulse 74   Temp 97.8 F (36.6 C) (Oral)   Resp 14   Ht 6' (1.829 m)   Wt 78.6 kg   SpO2 100%   BMI 23.50 kg/m    Awake and alert Chest b/l air entry Hss1s2 abd soft Rt foot   Surgical site looks good- no erythema or discharge   CBC Latest Ref Rng & Units 12/03/2019 12/02/2019 12/01/2019  WBC 4.0 - 10.5 K/uL 10.9(H) 9.8 13.8(H)  Hemoglobin 13.0 - 17.0 g/dL 7.6(L) 8.1(L) 9.2(L)  Hematocrit 39.0 - 52.0 % 23.3(L) 23.9(L) 28.1(L)  Platelets 150 - 400 K/uL 384 350 305    CMP Latest Ref Rng & Units 12/03/2019 12/02/2019 12/01/2019  Glucose 70 - 99 mg/dL 607(P) 710(G) 269(S)  BUN 6 - 20 mg/dL 9 8 11   Creatinine 0.61 - 1.24 mg/dL 8.54 6.27  Sodium 135 - 145 mmol/L 136 138 136  Potassium 3.5 - 5.1 mmol/L 3.6 3.5 3.6  Chloride 98 - 111 mmol/L 104 104 104  CO2 22 - 32 mmol/L 24 25 25   Calcium 8.9 - 10.3 mg/dL 8.1(L) 8.1(L) 8.3(L)    Impression Coag neg staph in blood is a contaminant as 1 of 4  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

## 2019-12-03 NOTE — Discharge Instructions (Signed)
[facility]  POST OPERATIVE INSTRUCTIONS FOR DR. TROXLER, DR. FOWLER, AND DR. Feven Alderfer [facility]   1. Take your medication as prescribed.  Pain medication should be taken only as needed.  2. Keep the dressing clean, dry and intact.  3. Keep your foot elevated above the heart level for the first 48 hours.  4. Walking to the bathroom and brief periods of walking are acceptable, unless we have instructed you to be non-weight bearing.  5. Always wear your post-op shoe when walking.  Always use your crutches if you are to be non-weight bearing.  6. Do not take a shower. Baths are permissible as long as the foot is kept out of the water.   7. Every hour you are awake:  - Bend your knee 15 times. - Flex foot 15 times - Massage calf 15 times  8. Call [facility] ([phone]) if any of the following problems occur: - You develop a temperature or fever. - The bandage becomes saturated with blood. - Medication does not stop your pain. - Injury of the foot occurs. - Any symptoms of infection including redness, odor, or red streaks running from wound.  

## 2019-12-03 NOTE — TOC Progression Note (Signed)
Transition of Care [facility]) - Progression Note    Patient Details  Name: Brendan Williams MRN: [id number] Date of Birth: [birth year]  Transition of Care [facility]) CM/SW Contact  Barrie Dunker, RN Phone Number: 12/03/2019, 1:33 PM  Clinical Narrative:   Spoke with the patient about the need for a wheelchair, he stated that he has a friend that is going to let him borrow one, I asked if he could verify that for sure, he said that he would definatly be able to get one    Expected Discharge Plan: Home w Home Health Services Barriers to Discharge: Continued Medical Work up  Expected Discharge Plan and Services Expected Discharge Plan: Home w Home Health Services   Discharge Planning Services: CM Consult, Medication Assistance, Other - See comment(charity [facility] services)   Living arrangements for the past 2 months: Single Family Home                 DME Arranged: Walker rolling DME Agency: [employer] Date DME Agency Contacted: 12/02/19 Time DME Agency Contacted: [phone] Representative spoke with at DME Agency: Nida Boatman HH Arranged: PT([facility]) HH Agency: [facility] Date [facility] Agency Contacted: 12/02/19 Time HH Agency Contacted: [phone] Representative spoke with at [facility] Agency: Cassie   Social Determinants of Health (SDOH) Interventions    Readmission Risk Interventions No flowsheet data found.

## 2019-12-03 NOTE — Consult Note (Addendum)
Pharmacy Antibiotic Note  Brendan Williams is a 59 y.o. male admitted on 11/25/2019 with sepsis.  Pharmacy has been consulted for unasyn and vancomycin dosing. Patient received 4 days of Zosyn. Per ID physician, have changed from Zosyn to Unasyn.   Plan: 1) Continue Unasyn 3g IV Q6 hours.  2) Vancomycin - Checked levels after 7th scheduled dose  Dose @ 1250mg  q12h given 2/16 @ 2201  Peak 02/17 @ 0041 = 29 Trough 02/17 @ 0924 = 10  This gives a Ke of 0.1221 and t 1/2 of 5.7 hr and current AUC @ steady state of 460  Will continue  Vancomycin 1250mg  q12h  Height: 6' (182.9 cm) Weight: 173 lb 4.5 oz (78.6 kg) IBW/kg (Calculated) : 77.6  Temp (24hrs), Avg:98 F (36.7 C), Min:97.2 F (36.2 C), Max:98.5 F (36.9 C)  Recent Labs  Lab 11/29/19 0517 11/30/19 0502 12/01/19 0324 12/02/19 0543 12/02/19 0619 12/02/19 0913 12/03/19 0040 12/03/19 0041 12/03/19 0924  WBC 15.3* 14.2* 13.8* 9.8  --   --  10.9*  --   --   CREATININE 0.78 0.83 0.85 0.71  --   --  0.61  --   --   LATICACIDVEN  --   --   --   --  1.1 1.0  --   --   --   VANCOTROUGH  --   --   --   --   --   --   --   --  10*  VANCOPEAK  --   --   --   --   --   --   --  29*  --     Estimated Creatinine Clearance: 109.1 mL/min (by C-G formula based on SCr of 0.61 mg/dL).    No Known Allergies  Antimicrobials this admission: 2/16 Unasyn >> 2/13 pip/tazo >> 2/16 2/13 vancomycin >>   Dose adjustments this admission: None  Microbiology results:  2/13 Bcx: Staph Species(coagulase negative) 2/9 MRSA PCR: negative 2/16 Toe tissue culture: Staph Aureus pending susc  Thank you for allowing pharmacy to be a part of this patient's care.  4/9, PharmD, BCPS Clinical Pharmacist 12/03/2019 10:44 AM

## 2019-12-03 NOTE — Progress Notes (Signed)
[facility] Progress Note  Patient: Brendan Williams    JKD:[id number]  DOA: 11/25/2019     Date of Service: the patient was seen and examined on 12/03/2019  Chief complaint Right fifth toe gangrene, cellulitis  Brief hospital course: Brendan Williams is a 59 y.o. male who presents with darkening discoloration to the right fifth toe with associated erythema and edema that is extending proximal over the foot and ankle.  Patient was admitted for further work-up due to gangrenous changes and cellulitis.  Vascular determine that he had very poor blood flow and perform revascularization procedure to the right lower extremity.  Podiatry was consulted at that point after revascularization for potential amputation of the right fifth toe.   Assessment and Plan: 1.  Right leg cellulitis. Severe PAD with critical limb ischemia. Right foot toe gangrene S/p AMPUTATION RIGHT 5TH TOE on 12/02/19 Initially started on broad-spectrum antibiotic coverage. Vascular surgery was the primary on the patient initially. SP bilateral femoral endarterectomy with stenting. Pain control per vascular surgery. --continue vanc and Unasyn pending OR cx result, per ID  2.  Alcohol abuse. Smoking. Acute encephalopathy secondary to alcohol withdrawal Continue CIWA protocol. Continue B12 and folic acid. Continue nicotine patch. Also on clonidine  3.  Constipation. Bowel regimen initiated.  4.  Hyperlipidemia Continue Lipitor.  6.  Moderate malnutrition Body mass index is 23.5 kg/m.  Nutrition Problem: Moderate Malnutrition Etiology: poor appetite Interventions: Interventions: Ensure Enlive (each supplement provides 350kcal and 20 grams of protein), MVI, Magic cup   Diet: Cardiac diet DVT Prophylaxis: Therapeutic Anticoagulation with Eliquis   Advance goals of care discussion: Full code  Family Communication: not today  Disposition:  Pt is from home, admitted with gangrene and cellulitis.   Discharge to home with home health after OR cx results and ID finalizes abx plan, in 1-2 days.   Subjective:  Pt reported some pain in his foot due to recent dressing change.  Normal PO intake and had BM.  No fever, dyspnea, chest pain, abdominal pain, N/V/D, dysuria.  Pt is eager to go home.   Physical Exam: Constitutional: NAD, AAOx3 HEENT: conjunctivae and lids normal, EOMI CV: RRR no M,R,G. Distal pulses +2.  No cyanosis.   RESP: CTA B/L, normal respiratory effort  GI: +BS, NTND Extremities: No effusions, edema, or tenderness in LLE.  Right foot ACE wrapped. SKIN: warm, dry and intact Neuro: II - XII grossly intact.  Sensation intact Psych: Normal mood and affect.  Appropriate judgement and reason    Vitals:   12/03/19 0448 12/03/19 0819 12/03/19 1205 12/03/19 1623  BP: 130/72 101/68 138/80 121/70  Pulse: 75 74 74 81  Resp: 16 14 12    Temp: 98.4 F (36.9 C) 97.8 F (36.6 C) 98.1 F (36.7 C) 98.3 F (36.8 C)  TempSrc: Oral Oral Oral Oral  SpO2: 99% 100% 100% 99%  Weight:      Height:        Intake/Output Summary (Last 24 hours) at 12/03/2019 2008 Last data filed at 12/03/2019 1800 Gross per 24 hour  Intake 1358.36 ml  Output 2500 ml  Net -1141.64 ml   Filed Weights   11/30/19 0423 11/30/19 2347 12/02/19 0446  Weight: 78.1 kg 78.7 kg 78.6 kg    Data Reviewed: I have personally reviewed and interpreted daily labs, tele strips, imagings as discussed above. I reviewed all nursing notes, pharmacy notes, vitals, pertinent old records I have discussed plan of care as described above with RN and patient/family.  CBC: Recent Labs  Lab 11/29/19 0517 11/30/19 0502 12/01/19 0324 12/02/19 0543 12/03/19 0040  WBC 15.3* 14.2* 13.8* 9.8 10.9*  HGB 10.8* 9.2* 9.2* 8.1* 7.6*  HCT 32.7* 27.9* 28.1* 23.9* 23.3*  MCV 92.1 91.8 92.4 90.5 92.5  PLT 345 302 305 350 161   Basic Metabolic Panel: Recent Labs  Lab 11/29/19 0517 11/30/19 0502 12/01/19 0324 12/02/19 0543  12/03/19 0040  NA 136 136 136 138 136  K 4.1 3.7 3.6 3.5 3.6  CL 102 103 104 104 104  CO2 24 23 25 25 24   GLUCOSE 130* 100* 118* 114* 139*  BUN 9 9 11 8 9   CREATININE 0.78 0.83 0.85 0.71 0.61  CALCIUM 8.4* 8.2* 8.3* 8.1* 8.1*  MG 1.8 1.8 2.0 1.9 1.9    Studies: No results found.  Scheduled Meds: . apixaban  5 mg Oral BID  . aspirin EC  81 mg Oral Daily  . atorvastatin  10 mg Oral q1800  . Chlorhexidine Gluconate Cloth  6 each Topical Daily  . feeding supplement (ENSURE ENLIVE)  237 mL Oral TID BM  . multivitamin with minerals  1 tablet Oral Daily  . nicotine  21 mg Transdermal Daily  . senna-docusate  1 tablet Oral BID   Continuous Infusions: . sodium chloride 0 mL/hr at 12/01/19 2259  . ampicillin-sulbactam (UNASYN) IV Stopped (12/03/19 1446)  . vancomycin Stopped (12/03/19 1209)   PRN Meds: sodium chloride, acetaminophen **OR** acetaminophen, morphine injection, ondansetron **OR** ondansetron (ZOFRAN) IV, oxyCODONE, oxyCODONE, zolpidem  Enzo Bi, MD [facility] 12/03/2019 8:08 PM  To reach On-call, see care teams to locate the attending and reach out to them via [email or web address]. If 7PM-7AM, please contact night-coverage If you still have difficulty reaching the attending provider, please page the [facility] (Director on Call) for [facility] on amion for assistance.

## 2019-12-03 NOTE — Progress Notes (Signed)
[facility] Daily Progress Note   Subjective: 11/28/19: 1. Left common femoral, profunda femoris, and superficial femoral artery endarterectomies 2. Right common femoral, profunda femoris, and superficial femoral artery endarterectomies 3.Fogarty embolectomy balloon to the right iliac arteries using a 4 Fogarty balloon 4.Fogarty embolectomy balloon to the left iliac arteries using a 3 and a 4 Fogarty balloon 5.Catheter placement into the aorta from bilateral femoral approaches 6.Aortogram and bilateral lower extremity angiograms 7.Kissing balloon stent placements into the common iliac arteries bilaterally with 9 mm diameter by 58 mm length lifestream stents 8.Additional stent placement into the right iliac arteries both common and external with 9 mm diameter by 58 mm length lifestream stent and a 9 mm diameter by 7.5 cm length Viabahn stent 9.Additional stent placement to the left iliac arteries both common and external with 9 mm diameter by 58 mm length lifestream stent and a 9 mm diameter by 10 cm length Viabahn stent 10.Additional stent placement into the aorta from bilateral approaches to extend inflow proximally using 12 mm diameter by 38 mm length lifestream stents from each side 11.Angioplasty of the right anterior tibial artery with 3 mm diameter by 10 cm length angioplasty balloon 12.Viabahn stent placement to the right SFA and proximal popliteal artery with 6 mm diameter by 25 cm length stent 13.Viabahn stent placement to the left SFA with 6 mm diameter by 15 cm length stent  Patient without complaint. No issues overnight.   Objective: Vitals:   12/02/19 2153 12/03/19 0010 12/03/19 0448 12/03/19 0819  BP: 127/68 119/66 130/72 101/68  Pulse: 82 77 75 74  Resp: 16  16 14   Temp: 98.4 F (36.9 C) 98.5 F (36.9 C) 98.4 F (36.9 C) 97.8 F (36.6 C)  TempSrc: Oral Oral Oral Oral  SpO2: 99% 98% 99% 100%  Weight:      Height:         Intake/Output Summary (Last 24 hours) at 12/03/2019 1026 Last data filed at 12/03/2019 0554 Gross per 24 hour  Intake 1680 ml  Output 1710 ml  Net -30 ml   Physical Exam: A&Ox3, NAD CV: RRR Pulmonary: CTA Bilaterally Abdomen: Soft, Nontender, Nondistended Right Groin: Prevena VAC has been removed.  Incision is clean dry and intact.  Staples are intact. Left Groin: Prevena VAC has been removed.  Incision is clean dry and intact.  Staples are intact. Vascular:             Left Lower Extremity: Thigh soft.  Calf soft.  Extremities warm distally in toes.  Motor/sensory is intact.  No compartment syndrome noted.             Right Lower Extremity:  Thigh soft.  Calf soft.  Podiatry dressing intact, clean and dry. Motor/sensory is intact.  No compartment syndrome noted. Red streaking to the extremity has resolved.   Laboratory: CBC    Component Value Date/Time   WBC 10.9 (H) 12/03/2019 0040   HGB 7.6 (L) 12/03/2019 0040   HCT 23.3 (L) 12/03/2019 0040   PLT 384 12/03/2019 0040   BMET    Component Value Date/Time   NA 136 12/03/2019 0040   K 3.6 12/03/2019 0040   CL 104 12/03/2019 0040   CO2 24 12/03/2019 0040   GLUCOSE 139 (H) 12/03/2019 0040   BUN 9 12/03/2019 0040   CREATININE 0.61 12/03/2019 0040   CALCIUM 8.1 (L) 12/03/2019 0040   GFRNONAA >60 12/03/2019 0040   GFRAA >60 12/03/2019 0040   Assessment/Planning: The  patient is a 59 year old male with known severe peripheral artery disease of bilateral extremities.  Status post bilateral femoral endarterectomy with endovascular intervention   1) Groins incisions are clean, dry and intact 2) RLE with improvement to erythema / pallor 3) PT / OT - recommend home PT 4) On ASA and Eliquis. 5) From vascular standpoint - can be discharged home when cleared by podiatry / medically stable.   Discussed with Dr. Wallis Mart Vladislav Axelson PA-C 12/03/2019 10:26 AM

## 2019-12-03 NOTE — Progress Notes (Signed)
Ch visited with Pt as follow up from a day ago. Pt had requested for a follow-up visit before amputation. Pt expressed concerns about the weather and how he wished to be discharged today. Pt reported that he is waiting for a doctor to sign off on his discharge. Pt requested prayer for healing of foot. Pt requested for Ch to come visit again when he will be back for his follow-up on the toe. Ch prayed with Pt. Pt was grateful for prayer.    12/03/19 1431  Clinical Encounter Type  Visited With Patient  Visit Type Follow-up;Psychological support;Spiritual support  Referral From Other (Comment)  Consult/Referral To Other (Comment) (Routine)  Spiritual Encounters  Spiritual Needs Prayer;Emotional  Stress Factors  Patient Stress Factors Health changes;Loss of control;Major life changes

## 2019-12-03 NOTE — Progress Notes (Addendum)
PODIATRY / FOOT AND ANKLE SURGERY PROGRESS NOTE  Requesting Physician: Marcelle Overlie PA  Reason for consult: Right fifth toe gangrene, cellulitis  Chief Complaint: Right foot infection   HPI: Brendan Williams is a 59 y.o. male who presents status post 1 day right partial fifth ray amputation.  Patient rates his pain overall 2/10.  He states that his pain goes up and down but he feels is gotten better overall since the procedure.  He has kept a surgical dressing clean, dry, and intact since the procedure.  He states he is really not been putting much pressure on the right foot since the operation.  Patient denies nausea, vomiting, fever, chills.  PMHx:  Past Medical History:  Diagnosis Date  . Peripheral arterial disease ([place])     Surgical Hx:  Past Surgical History:  Procedure Laterality Date  . AMPUTATION TOE Right 12/02/2019   Procedure: AMPUTATION RIGHT 5TH TOE;  Surgeon: Caroline More, DPM;  Location: [facility];  Service: Podiatry;  Laterality: Right;  . APPLICATION OF WOUND VAC Bilateral 11/28/2019   Procedure: APPLICATION OF WOUND VAC;  Surgeon: Algernon Huxley, MD;  Location: [facility];  Service: Vascular;  Laterality: Bilateral;  [place]   . BACK SURGERY     lower ruptured disk  . CENTRAL VENOUS CATHETER INSERTION  11/28/2019   Procedure: INSERTION CENTRAL LINE ADULT;  Surgeon: Algernon Huxley, MD;  Location: [facility];  Service: Vascular;;  . ENDARTERECTOMY FEMORAL Bilateral 11/28/2019   Procedure: ENDARTERECTOMY FEMORAL;  Surgeon: Algernon Huxley, MD;  Location: [facility];  Service: Vascular;  Laterality: Bilateral;  . LOWER EXTREMITY ANGIOGRAPHY Right 11/14/2019   Procedure: LOWER EXTREMITY ANGIOGRAPHY;  Surgeon: Algernon Huxley, MD;  Location: [facility];  Service: Cardiovascular;  Laterality: Right;  . STOMACH SURGERY    . TONSILECTOMY, ADENOIDECTOMY, BILATERAL MYRINGOTOMY AND TUBES      FHx:  Family History  Problem Relation Age of Onset  . Leukemia Mother   . Heart  disease Father        a. CABG  . Heart disease Sister        a. MVR  . Cancer Sister   . Cancer Brother   . CAD Brother        a. CABG    Social History:  reports that he has been smoking cigarettes. He has been smoking about 1.00 pack per day. He has never used smokeless tobacco. He reports current alcohol use of about 5.0 standard drinks of alcohol per week. He reports current drug use. Frequency: 3.00 times per week. Drug: Marijuana.  Allergies: No Known Allergies  Review of Systems: General ROS: negative Respiratory ROS: no cough, shortness of breath, or wheezing Cardiovascular ROS: no chest pain or dyspnea on exertion Musculoskeletal ROS: positive for - joint swelling and pain in foot - right Neurological ROS: positive for - numbness/tingling Dermatological ROS: positive for Right foot incision site fifth ray amputation partial  Medications Prior to Admission  Medication Sig Dispense Refill  . aspirin EC 81 MG tablet Take 1 tablet (81 mg total) by mouth daily. 150 tablet 2  . atorvastatin (LIPITOR) 10 MG tablet Take 1 tablet (10 mg total) by mouth daily. 30 tablet 11  . clopidogrel (PLAVIX) 75 MG tablet Take 1 tablet (75 mg total) by mouth daily. 30 tablet 11    Physical Exam: General: Alert and oriented.  No apparent distress.  Vascular: DP/PT pulses +1 bilateral, capillary fill time intact to the forefoot with  the exception of the distal amputation site around the fourth toe laterally which is slightly delayed, no hair growth to digits.  Right lower extremity mild none pitting edema with associated erythema that extends from the right foot to the ankle but overall decreased and getting better.  Neuro: Light touch sensation reduced to digits bilaterally.  Derm: Right partial fifth ray amputation site appears to have incision site intact with sutures intact, no signs of dehiscence currently.  Mild delay in capillary fill time distally at the lateral fourth toe and plantar  skin flap.  Capillary fill time appears to be intact the remainder the incision site.  No active drainage, decreased erythema and edema present since procedure.  No odor.    MSK: Right partial fifth ray amputation, mild pain on palpation of amputation site.  Results for orders placed or performed during the hospital encounter of 11/25/19 (from the past 48 hour(s))  CBC     Status: Abnormal   Collection Time: 12/02/19  5:43 AM  Result Value Ref Range   WBC 9.8 4.0 - 10.5 K/uL   RBC 2.64 (L) 4.22 - 5.81 MIL/uL   Hemoglobin 8.1 (L) 13.0 - 17.0 g/dL   HCT 29.9 (L) 24.2 - 68.3 %   MCV 90.5 80.0 - 100.0 fL   MCH 30.7 26.0 - 34.0 pg   MCHC 33.9 30.0 - 36.0 g/dL   RDW 41.9 62.2 - 29.7 %   Platelets 350 150 - 400 K/uL   nRBC 0.0 0.0 - 0.2 %    Comment: Performed at [facility], [address] [place], [place] [address]  Basic metabolic panel     Status: Abnormal   Collection Time: 12/02/19  5:43 AM  Result Value Ref Range   Sodium 138 135 - 145 mmol/L   Potassium 3.5 3.5 - 5.1 mmol/L   Chloride 104 98 - 111 mmol/L   CO2 25 22 - 32 mmol/L   Glucose, Bld 114 (H) 70 - 99 mg/dL   BUN 8 6 - 20 mg/dL   Creatinine, Ser 1.94 0.61 - 1.24 mg/dL   Calcium 8.1 (L) 8.9 - 10.3 mg/dL   GFR calc non Af Amer >60 >60 mL/min   GFR calc Af Amer >60 >60 mL/min   Anion gap 9 5 - 15    Comment: Performed at [facility], [address] [place], [place] [address]  Magnesium     Status: None   Collection Time: 12/02/19  5:43 AM  Result Value Ref Range   Magnesium 1.9 1.7 - 2.4 mg/dL    Comment: Performed at [facility], [address] [place], [place] [address]  Lactic acid, plasma     Status: None   Collection Time: 12/02/19  6:19 AM  Result Value Ref Range   Lactic Acid, Venous 1.1 0.5 - 1.9 mmol/L    Comment: Performed at [facility], [address] [place], [place] [address]  Lactic acid, plasma     Status: None   Collection Time: 12/02/19  9:13 AM    Result Value Ref Range   Lactic Acid, Venous 1.0 0.5 - 1.9 mmol/L    Comment: Performed at [facility], [address] [place], [place] [address]  Surgical pathology     Status: None   Collection Time: 12/02/19 12:25 PM  Result Value Ref Range   SURGICAL PATHOLOGY      SURGICAL PATHOLOGY CASE: [id number] PATIENT: Brendan Williams Surgical Pathology Report  Specimen Submitted: A. Toe, right partial 5th ray; amputation  Clinical History: Right 5th toe gangrene.    DIAGNOSIS: A. TOE, RIGHT PARTIAL FIFTH RAY; AMPUTATION: - ULCERATION, GANGRENOUS NECROSIS, AND ACUTE INFLAMMATION. - PARTIALLY DEVITALIZED BONE WITH FEATURES OF CHRONIC OSTEOMYELITIS; NO DEFINITE ACUTE OSTEOMYELITIS. - SKIN, SOFT TISSUE, AND BONY MARGINS FREE OF ACUTE INFLAMMATION.  GROSS DESCRIPTION: A. Labeled: Right partial fifth ray Received: In formalin Size: 7.5 x 2.0 x 1.7 cm Description: Fifth phalange amputated at the proximal phalanx level covered by white skin.  The tip of the distal phalange demonstrates deep necrotic mummification including bone. Sections are submitted following decalcification.  Summary of sections: 1 - skin (perpendicular section) and bone (cross-section) margins 2 - representative sections   Final Diagnosis performed by  Katherine Mantle, MD.   Electronically signed 12/03/2019 9:35:31AM The electronic signature indicates that the named Attending Pathologist has evaluated the specimen Technical component performed at [facility], [address], [place], [place] [address] Lab: [phone] Dir: Jolene Schimke, MD, MMM  Professional component performed at [facility], [facility], [address], [place], [place] [address] Lab: [phone] Dir: Georgiann Cocker. Rubinas, MD   Aerobic/Anaerobic Culture (surgical/deep wound)     Status: None (Preliminary result)   Collection Time: 12/02/19  1:06 PM   Specimen: PATH Digit amputation; Tissue  Result Value Ref Range    Specimen Description TISSUE TOE    Special Requests      RIGHT 5TH TOE Performed at [facility], [address] [place], [place] [address]    Gram Stain      RARE WBC PRESENT, PREDOMINANTLY PMN FEW GRAM POSITIVE COCCI    Culture      FEW STAPHYLOCOCCUS AUREUS SUSCEPTIBILITIES TO FOLLOW Performed at [facility], [address], [place], [place] [address]    Report Status PENDING   CBC     Status: Abnormal   Collection Time: 12/03/19 12:40 AM  Result Value Ref Range   WBC 10.9 (H) 4.0 - 10.5 K/uL   RBC 2.52 (L) 4.22 - 5.81 MIL/uL   Hemoglobin 7.6 (L) 13.0 - 17.0 g/dL   HCT 15.1 (L) 76.1 - 60.7 %   MCV 92.5 80.0 - 100.0 fL   MCH 30.2 26.0 - 34.0 pg   MCHC 32.6 30.0 - 36.0 g/dL   RDW 37.1 06.2 - 69.4 %   Platelets 384 150 - 400 K/uL   nRBC 0.0 0.0 - 0.2 %    Comment: Performed at [facility], [address] [place], [place] [address]  Basic metabolic panel     Status: Abnormal   Collection Time: 12/03/19 12:40 AM  Result Value Ref Range   Sodium 136 135 - 145 mmol/L   Potassium 3.6 3.5 - 5.1 mmol/L   Chloride 104 98 - 111 mmol/L   CO2 24 22 - 32 mmol/L   Glucose, Bld 139 (H) 70 - 99 mg/dL   BUN 9 6 - 20 mg/dL   Creatinine, Ser 7.03 0.61 - 1.24 mg/dL   Calcium 8.1 (L) 8.9 - 10.3 mg/dL   GFR calc non Af Amer >60 >60 mL/min   GFR calc Af Amer >60 >60 mL/min   Anion gap 8 5 - 15    Comment: Performed at [facility], [address] [place], [place] [address]  Magnesium     Status: None   Collection Time: 12/03/19 12:40 AM  Result Value Ref Range   Magnesium 1.9 1.7 - 2.4 mg/dL  Comment: Performed at [facility], [address] [place], [place] [address]  Vancomycin, peak     Status: Abnormal   Collection Time: 12/03/19 12:41 AM  Result Value Ref Range   Vancomycin Pk 29 (L) 30 - 40 ug/mL    Comment: Performed at [facility], [address] [place], [place] [address]  Vancomycin, trough     Status:  Abnormal   Collection Time: 12/03/19  9:24 AM  Result Value Ref Range   Vancomycin Tr 10 (L) 15 - 20 ug/mL    Comment: Performed at [facility], [address] [place], [place] [address]   DG Foot 2 Views Right  Result Date: 12/02/2019 CLINICAL DATA:  Status post fifth toe amputation. EXAM: RIGHT FOOT - 2 VIEW COMPARISON:  12/01/2019 FINDINGS: Two views study shows fifth toe amputation at the level of the mid metatarsal. Overlying bandage material evident. No worrisome lytic or sclerotic osseous abnormality. IMPRESSION: Status post little toe amputation at the level of the mid metatarsal. Electronically Signed   By: Kennith Center M.D.   On: 12/02/2019 17:21   DG Foot 2 Views Right  Result Date: 12/01/2019 CLINICAL DATA:  Right foot wound EXAM: RIGHT FOOT - 2 VIEW COMPARISON:  None. FINDINGS: Frontal and lateral views of the right foot demonstrate no displaced fractures. Alignment is anatomic. Joint spaces are well preserved. Soft tissues are normal. IMPRESSION: 1. Unremarkable right foot. Electronically Signed   By: Sharlet Salina M.D.   On: 12/01/2019 20:52    Blood pressure 138/80, pulse 74, temperature 98.1 F (36.7 C), temperature source Oral, resp. rate 12, height 6' (1.829 m), weight 78.6 kg, SpO2 100 %.  Assessment 1. Cellulitis right foot associated with gangrenous right fifth toe status post partial fifth ray amputation 2. 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 PVD status post revascularization  Plan -Patient seen and examined -Incision line appears to be intact to the right partial fifth ray amputation site with no acute drainage or worsening signs of infection.  Mild delay in capillary fill time to the distal aspect of the incision around the lateral aspect of the fourth toe.  We will continue to monitor this area. -Redressed with Xeroform to the incision site followed by 4 x 4 gauze, ABD, Kerlix, Ace wrap.  Patient instructed to leave dressings clean, dry,  and intact until postoperative visit. -Recommend partial weightbearing with heel contact only in a surgical shoe to the right foot.  Appreciate physical therapy recommendations. -Pathologic specimen appears to be clear at the proximal margin for any infectious tissue.  Wound culture taken is growing staph aureus currently, susceptibilities pending.  Appreciate antibiotic recommendations per infectious disease.   Podiatry team to sign off at this time.  Discharge instructions placed in chart.  Patient to follow-up in 1 week after discharge date for further evaluation.  Rosetta Posner, DPM 12/03/2019, 3:10 PM

## 2019-12-04 LAB — BASIC METABOLIC PANEL
Anion gap: 7 (ref 5–15)
BUN: 7 mg/dL (ref 6–20)
CO2: 26 mmol/L (ref 22–32)
Calcium: 8.3 mg/dL — ABNORMAL LOW (ref 8.9–10.3)
Chloride: 105 mmol/L (ref 98–111)
Creatinine, Ser: 0.67 mg/dL (ref 0.61–1.24)
GFR calc Af Amer: >60 mL/min (ref >60)
GFR calc non Af Amer: >60 mL/min (ref >60)
Glucose, Bld: 111 mg/dL — ABNORMAL HIGH (ref 70–99)
Potassium: 3.8 mmol/L (ref 3.5–5.1)
Sodium: 138 mmol/L (ref 135–145)

## 2019-12-04 LAB — CULTURE, BLOOD (ROUTINE X 2): Culture: NO GROWTH

## 2019-12-04 LAB — CBC
HCT: 24.9 % — ABNORMAL LOW (ref 39.0–52.0)
Hemoglobin: 8.2 g/dL — ABNORMAL LOW (ref 13.0–17.0)
MCH: 30.5 pg (ref 26.0–34.0)
MCHC: 32.9 g/dL (ref 30.0–36.0)
MCV: 92.6 fL (ref 80.0–100.0)
Platelets: 476 10*3/uL — ABNORMAL HIGH (ref 150–400)
RBC: 2.69 MIL/uL — ABNORMAL LOW (ref 4.22–5.81)
RDW: 13.2 % (ref 11.5–15.5)
WBC: 9.7 10*3/uL (ref 4.0–10.5)
nRBC: 0 % (ref 0.0–0.2)

## 2019-12-04 LAB — MAGNESIUM: Magnesium: 1.9 mg/dL (ref 1.7–2.4)

## 2019-12-04 MED ORDER — SULFAMETHOXAZOLE-TRIMETHOPRIM 800-160 MG PO TABS: 1.0000 | ORAL_TABLET | Freq: Two times a day (BID) | ORAL | 0 refills | Status: DC

## 2019-12-04 MED ORDER — SULFAMETHOXAZOLE-TRIMETHOPRIM 800-160 MG PO TABS
1.0000 | ORAL_TABLET | Freq: Two times a day (BID) | ORAL | Status: DC
  Administered 2019-12-04: 1 via ORAL
  Filled 2019-12-04: qty 1

## 2019-12-04 MED ORDER — OXYCODONE HCL 5 MG PO TABS: 5.0000 mg | ORAL_TABLET | Freq: Three times a day (TID) | ORAL | 0 refills | Status: AC | PRN

## 2019-12-04 NOTE — Plan of Care (Signed)
  Problem: Education: Goal: Knowledge of General Education information will improve Description: Including pain rating scale, medication(s)/side effects and non-pharmacologic comfort measures Outcome: Progressing   Problem: Clinical Measurements: Goal: Ability to maintain clinical measurements within normal limits will improve Outcome: Progressing Goal: Will remain free from infection Outcome: Progressing Goal: Diagnostic test results will improve Outcome: Progressing   Problem: Activity: Goal: Risk for activity intolerance will decrease Outcome: Progressing   Problem: Safety: Goal: Ability to remain free from injury will improve Outcome: Progressing   

## 2019-12-04 NOTE — Progress Notes (Signed)
ID MRSA in foot culture- can change to PO bactrim DS BID for 7-10 days to treat skin and soft tissue

## 2019-12-05 ENCOUNTER — Other Ambulatory Visit: Payer: Self-pay | Admitting: Podiatry

## 2019-12-05 DIAGNOSIS — L03115 Cellulitis of right lower limb: Secondary | ICD-10-CM

## 2019-12-05 MED ORDER — CLINDAMYCIN HCL 150 MG PO CAPS: 300.0000 mg | ORAL_CAPSULE | Freq: Three times a day (TID) | ORAL | Status: DC

## 2019-12-05 MED ORDER — CLINDAMYCIN HCL 300 MG PO CAPS: 300.0000 mg | ORAL_CAPSULE | Freq: Three times a day (TID) | ORAL | 0 refills | Status: DC

## 2019-12-09 LAB — AEROBIC/ANAEROBIC CULTURE W GRAM STAIN (SURGICAL/DEEP WOUND)

## 2019-12-11 NOTE — Discharge Summary (Signed)
Physician Discharge Summary   Brendan Williams  male DOB: 01-19-1961  QJJ:[id number]  PCP: Gildardo Pounds, PA  Admit date: 11/25/2019 Discharge date:  12/04/2019  Admitted From: home Disposition:  home Home Health: Yes CODE STATUS: Full code  Discharge Instructions    Diet - low sodium heart healthy   Complete by: As directed    Discharge instructions   Complete by: As directed    Please continue to take antibiotic Bactrim for 10 more days after discharge for treatment of your foot infection.  Follow up with Podiatry in outpatient clinic.  Dr. Darlin Priestly - -   Increase activity slowly   Complete by: As directed        Hospital Course:  For full details, please see H&P, progress notes, consult notes and ancillary notes.  Briefly,  Brendan Williams a 59 y.o.malewho presents with darkening discoloration to the right fifth toe with associated erythema and edema that is extending proximal over the foot and ankle. Patient was admitted for further work-up due to gangrenous changes and cellulitis. Vascular determined that he had very poor blood flow and performed revascularization procedure to the right lower extremity. Podiatry was consulted at that point after revascularization for amputation of the right fifth toe.  1.  Right leg cellulitis. Severe PAD with critical limb ischemia. Right foot toe gangrene S/p AMPUTATION RIGHT 5TH TOE on 12/02/19 Initially started on broad-spectrum antibiotic coverage.  Vascular surgery was the primary for the patient initially who performed bilateral femoral endarterectomy with stenting.  Pt then received amputation of right 5th toe with Dr. Excell Seltzer podiatry.  Pt was on vanc and Unasyn during hospitalization.  OR foot cx grew MRSA.  Pt was transitioned to PO Bactrim DS BID for 10 more day after discharge, per ID rec.  2.  Alcohol abuse. Smoking. Acute encephalopathy secondary to alcohol withdrawal CIWA protocol. Continued B12 and folic  acid. Continued nicotine patch.  3.  Constipation. Bowel regimen continued.  4.  Hyperlipidemia Continue Lipitor.  6.  Moderate malnutrition Body mass index is 23.5 kg/m.  Nutrition Problem: Moderate Malnutrition Etiology: poor appetite Interventions: Interventions: Ensure Enlive (each supplement provides 350kcal and 20 grams of protein), MVI, Magic cup   Discharge Diagnoses:  Active Problems:   Atherosclerosis of native arteries of extremity with rest pain [facility])   PAD (peripheral artery disease) [facility])    Discharge Instructions:  Allergies as of 12/04/2019   No Known Allergies     Medication List    TAKE these medications   aspirin EC 81 MG tablet Take 1 tablet (81 mg total) by mouth daily.   atorvastatin 10 MG tablet Commonly known as: Lipitor Take 1 tablet (10 mg total) by mouth daily.   clopidogrel 75 MG tablet Commonly known as: Plavix Take 1 tablet (75 mg total) by mouth daily.     ASK your doctor about these medications   oxyCODONE 5 MG immediate release tablet Commonly known as: Oxy IR/ROXICODONE Take 1 tablet (5 mg total) by mouth every 8 (eight) hours as needed for up to 5 days for moderate pain. Ask about: Should I take this medication?       Follow-up Information    Georgiana Spinner, NP. Schedule an appointment as soon as possible for a visit in 1 week(s).   Specialty: Vascular Surgery Why: First post-op check. No studies needed.  Please call and make your follow-up appointment. The office is closed due to inclement weather. Contact information: [address] [place] [address] [phone]           No Known Allergies   The results of significant diagnostics from this hospitalization (including imaging, microbiology, ancillary and laboratory) are listed below for reference.   Consultations:   Procedures/Studies: DG Chest 2 View  Result Date: 11/26/2019 CLINICAL DATA:  Preoperative respiratory evaluation prior to  vascular surgery in this patient with peripheral arterial disease. Current smoker. EXAM: CHEST - 2 VIEW COMPARISON:  06/12/2012. FINDINGS: Cardiac silhouette normal in size, unchanged. Thoracic aorta mildly atherosclerotic. Hilar and mediastinal contours otherwise unremarkable. Mild hyperinflation. Mild central peribronchial thickening, unchanged. Lungs otherwise clear. No localized airspace consolidation. No pleural effusions. No pneumothorax. Normal pulmonary vascularity. Degenerative changes involving the thoracic spine with a bridging left lateral osteophyte at T7-8. IMPRESSION: No acute cardiopulmonary disease. Electronically Signed   By: Hulan Saas M.D.   On: 11/26/2019 13:06   PERIPHERAL VASCULAR CATHETERIZATION  Result Date: 11/14/2019 See op note  DG Chest Port 1 View  Result Date: 11/29/2019 CLINICAL DATA:  Fever. S/p bilateral femoral endarterectomies and stenting. Hx of PAD, current smoker. EXAM: PORTABLE CHEST 1 VIEW COMPARISON:  Chest radiograph 11/26/2019 FINDINGS: Stable cardiomediastinal contours given technique. Low volumes with bronchovascular crowding in the lung bases. No definite new focal infiltrate. No pneumothorax or large pleural effusion. Probable old left rib fracture. IMPRESSION: Low volumes with bronchovascular crowding. No definite focal infiltrate. Electronically Signed   By: Emmaline Kluver M.D.   On: 11/29/2019 12:10   DG Foot 2 Views Right  Result Date: 12/02/2019 CLINICAL DATA:  Status post fifth toe amputation. EXAM: RIGHT FOOT - 2 VIEW COMPARISON:  12/01/2019 FINDINGS: Two views study shows fifth toe amputation at the level of the mid metatarsal. Overlying bandage material evident. No worrisome lytic or sclerotic osseous abnormality. IMPRESSION: Status post little toe amputation at the level of the mid metatarsal. Electronically Signed   By: Kennith Center M.D.   On: 12/02/2019 17:21   DG Foot 2 Views Right  Result Date: 12/01/2019 CLINICAL DATA:  Right  foot wound EXAM: RIGHT FOOT - 2 VIEW COMPARISON:  None. FINDINGS: Frontal and lateral views of the right foot demonstrate no displaced fractures. Alignment is anatomic. Joint spaces are well preserved. Soft tissues are normal. IMPRESSION: 1. Unremarkable right foot. Electronically Signed   By: Sharlet Salina M.D.   On: 12/01/2019 20:52   VAS [place] ABI WITH/WO TBI  Result Date: 11/14/2019 LOWER EXTREMITY DOPPLER STUDY Indications: Claudication.  Performing Technologist: Jamse Mead RT, RDMS, RVT  Examination Guidelines: A complete evaluation includes at minimum, Doppler waveform signals and systolic blood pressure reading at the level of bilateral brachial, anterior tibial, and posterior tibial arteries, when vessel segments are accessible. Bilateral testing is considered an integral part of a complete examination. Photoelectric Plethysmograph (PPG) waveforms and toe systolic pressure readings are included as required and additional duplex testing as needed. Limited examinations for reoccurring indications may be performed as noted.  ABI Findings: +---------+------------------+-----+-------------------+-----------------------+ Right    Rt Pressure (mmHg)IndexWaveform           Comment                 +---------+------------------+-----+-------------------+-----------------------+ Brachial 94                                        >27mm/Mg less than left +---------+------------------+-----+-------------------+-----------------------+ CFA  dampened monophasic                        +---------+------------------+-----+-------------------+-----------------------+ Popliteal                       dampened monophasic                        +---------+------------------+-----+-------------------+-----------------------+ ATA      76                0.60 dampened monophasic                         +---------+------------------+-----+-------------------+-----------------------+ PTA      69                0.55 dampened monophasic                        +---------+------------------+-----+-------------------+-----------------------+ Great Toe48                0.38 Abnormal                                   +---------+------------------+-----+-------------------+-----------------------+ +---------+------------------+-----+----------+-------+ Left     Lt Pressure (mmHg)IndexWaveform  Comment +---------+------------------+-----+----------+-------+ Brachial 126                                      +---------+------------------+-----+----------+-------+ CFA                             monophasic        +---------+------------------+-----+----------+-------+ Popliteal                       monophasic        +---------+------------------+-----+----------+-------+ ATA      94                0.75 monophasic        +---------+------------------+-----+----------+-------+ PTA      90                0.71 monophasic        +---------+------------------+-----+----------+-------+ Great Toe66                0.52 Abnormal          +---------+------------------+-----+----------+-------+ Limited duplex: RIGHT: Dampened monophasic flow in the common femoral, superficial femoral and popliteal arteries with monophasic flow reversal in the profunda femoral artery via collateralization. No flow detected in external iliac artery region. LEFT: Monophasic flow in the left common femoral, profunda femoral, superficial femoral, and popliteal arteries. No flow was visualized in the external iliac artery region with prominent superficial collateral noted.  Summary: Right: Resting right ankle-brachial index indicates moderate right lower extremity arterial disease. The right toe-brachial index is abnormal. Left: Resting left ankle-brachial index indicates moderate left lower extremity  arterial disease. The left toe-brachial index is abnormal. Limited duplex is suggestive of bilateral iliac level arterial occlusions, as described above.  *See table(s) above for measurements and observations.  Electronically signed by Leotis Pain MD on 11/14/2019 at 8:44:05 AM.    Final    VASCULAR C-ARM IMAGES ONLY ([facility])  Result Date: 11/28/2019 Please refer to notes  tab for details about interventional procedure. (Op Note)     Labs: BNP (last 3 results) No results for input(s): BNP in the last 8760 hours. Basic Metabolic Panel: No results for input(s): NA, K, CL, CO2, GLUCOSE, BUN, CREATININE, CALCIUM, MG, PHOS in the last 168 hours. Liver Function Tests: No results for input(s): AST, ALT, ALKPHOS, BILITOT, PROT, ALBUMIN in the last 168 hours. No results for input(s): LIPASE, AMYLASE in the last 168 hours. No results for input(s): AMMONIA in the last 168 hours. CBC: No results for input(s): WBC, NEUTROABS, HGB, HCT, MCV, PLT in the last 168 hours. Cardiac Enzymes: No results for input(s): CKTOTAL, CKMB, CKMBINDEX, TROPONINI in the last 168 hours. BNP: Invalid input(s): POCBNP CBG: No results for input(s): GLUCAP in the last 168 hours. D-Dimer No results for input(s): DDIMER in the last 72 hours. Hgb A1c No results for input(s): HGBA1C in the last 72 hours. Lipid Profile No results for input(s): CHOL, HDL, LDLCALC, TRIG, CHOLHDL, LDLDIRECT in the last 72 hours. Thyroid function studies No results for input(s): TSH, T4TOTAL, T3FREE, THYROIDAB in the last 72 hours.  Invalid input(s): FREET3 Anemia work up No results for input(s): VITAMINB12, FOLATE, FERRITIN, TIBC, IRON, RETICCTPCT in the last 72 hours. Urinalysis No results found for: COLORURINE, APPEARANCEUR, LABSPEC, PHURINE, GLUCOSEU, HGBUR, BILIRUBINUR, KETONESUR, PROTEINUR, UROBILINOGEN, NITRITE, LEUKOCYTESUR Sepsis Labs Invalid input(s): PROCALCITONIN,  WBC,  LACTICIDVEN Microbiology Recent Results (from the past  240 hour(s))  Aerobic/Anaerobic Culture (surgical/deep wound)     Status: None   Collection Time: 12/02/19  1:06 PM   Specimen: PATH Digit amputation; Tissue  Result Value Ref Range Status   Specimen Description TISSUE TOE  Final   Special Requests   Final    RIGHT 5TH TOE Performed at [facility], [address] [place], [place] [address]    Gram Stain   Final    RARE WBC PRESENT, PREDOMINANTLY PMN FEW GRAM POSITIVE COCCI    Culture   Final    FEW METHICILLIN RESISTANT STAPHYLOCOCCUS AUREUS RARE ESCHERICHIA COLI FEW CORYNEBACTERIUM MINUTISSIMUM Standardized susceptibility testing for this organism is not available. NO ANAEROBES ISOLATED Performed at [facility], [address], [place], [place] [address]    Report Status 12/09/2019 FINAL  Final   Organism ID, Bacteria METHICILLIN RESISTANT STAPHYLOCOCCUS AUREUS  Final   Organism ID, Bacteria ESCHERICHIA COLI  Final      Susceptibility   Escherichia coli - MIC*    AMPICILLIN >=32 RESISTANT Resistant     CEFAZOLIN <=4 SENSITIVE Sensitive     CEFEPIME <=0.12 SENSITIVE Sensitive     CEFTAZIDIME <=1 SENSITIVE Sensitive     CEFTRIAXONE <=0.25 SENSITIVE Sensitive     CIPROFLOXACIN >=4 RESISTANT Resistant     GENTAMICIN >=16 RESISTANT Resistant     IMIPENEM <=0.25 SENSITIVE Sensitive     TRIMETH/SULFA >=320 RESISTANT Resistant     AMPICILLIN/SULBACTAM 16 INTERMEDIATE Intermediate     PIP/TAZO <=4 SENSITIVE Sensitive     * RARE ESCHERICHIA COLI   Methicillin resistant staphylococcus aureus - MIC*    CIPROFLOXACIN <=0.5 SENSITIVE Sensitive     ERYTHROMYCIN <=0.25 SENSITIVE Sensitive     GENTAMICIN <=0.5 SENSITIVE Sensitive     OXACILLIN >=4 RESISTANT Resistant     TETRACYCLINE <=1 SENSITIVE Sensitive     VANCOMYCIN <=0.5 SENSITIVE Sensitive     TRIMETH/SULFA <=10 SENSITIVE Sensitive     CLINDAMYCIN <=0.25 SENSITIVE Sensitive     RIFAMPIN <=0.5 SENSITIVE Sensitive     Inducible Clindamycin NEGATIVE  Sensitive      * FEW METHICILLIN RESISTANT STAPHYLOCOCCUS AUREUS     Total time spend on discharging this patient, including the last patient exam, discussing the hospital stay, instructions for ongoing care as it relates to all pertinent caregivers, as well as preparing the medical discharge records, prescriptions, and/or referrals as applicable, is 30 minutes.    Darlin Priestly, MD  [facility] 12/11/2019, 4:26 PM  If 7PM-7AM, please contact night-coverage

## 2019-12-12 ENCOUNTER — Ambulatory Visit (INDEPENDENT_AMBULATORY_CARE_PROVIDER_SITE_OTHER): Payer: Self-pay | Admitting: Nurse Practitioner

## 2019-12-16 ENCOUNTER — Encounter (INDEPENDENT_AMBULATORY_CARE_PROVIDER_SITE_OTHER): Payer: Self-pay | Admitting: Vascular Surgery

## 2019-12-16 ENCOUNTER — Ambulatory Visit (INDEPENDENT_AMBULATORY_CARE_PROVIDER_SITE_OTHER): Payer: Self-pay | Admitting: Vascular Surgery

## 2019-12-16 ENCOUNTER — Other Ambulatory Visit: Payer: Self-pay

## 2019-12-16 VITALS — BP 139/80 | HR 70 | Resp 16 | Ht 73.0 in | Wt 164.0 lb

## 2019-12-16 DIAGNOSIS — I70221 Atherosclerosis of native arteries of extremities with rest pain, right leg: Secondary | ICD-10-CM

## 2019-12-16 NOTE — Assessment & Plan Note (Signed)
Doing well status post extensive revascularization including bilateral femoral endarterectomies, and bilateral endovascular revascularization.  Status post right fifth digit amputation by podiatry and he will follow up with them.  Half of his staples were removed today and we will bring him back in about a week to take the other half out and check his ABIs.

## 2019-12-16 NOTE — Progress Notes (Signed)
Patient ID: Brendan Williams, male   DOB: [birth year], 60 y.o.   MRN: [id number]  Chief Complaint  Patient presents with  . Follow-up    Post Op 1 WK    HPI Brendan Williams is a 59 y.o. male.  Patient returns in follow-up about 2 weeks status post extensive endovascular revascularization concomitant to femoral artery endarterectomies bilaterally.  He has undergone a right fifth toe amputation and will be seeing the podiatrist in the next several days.  His right foot pain and swelling is markedly improved.  He is not having any major issues and his ambulation is already improved.  His wounds are doing well and I removed about half of the staples from both groins today.   Past Medical History:  Diagnosis Date  . Peripheral arterial disease [facility])     Past Surgical History:  Procedure Laterality Date  . AMPUTATION TOE Right 12/02/2019   Procedure: AMPUTATION RIGHT 5TH TOE;  Surgeon: Rosetta Posner, DPM;  Location: [facility] ORS;  Service: Podiatry;  Laterality: Right;  . APPLICATION OF WOUND VAC Bilateral 11/28/2019   Procedure: APPLICATION OF WOUND VAC;  Surgeon: Annice Needy, MD;  Location: [facility] ORS;  Service: Vascular;  Laterality: Bilateral;  [facility]   . BACK SURGERY     lower ruptured disk  . CENTRAL VENOUS CATHETER INSERTION  11/28/2019   Procedure: INSERTION CENTRAL LINE ADULT;  Surgeon: Annice Needy, MD;  Location: [facility] ORS;  Service: Vascular;;  . ENDARTERECTOMY FEMORAL Bilateral 11/28/2019   Procedure: ENDARTERECTOMY FEMORAL;  Surgeon: Annice Needy, MD;  Location: [facility] ORS;  Service: Vascular;  Laterality: Bilateral;  . LOWER EXTREMITY ANGIOGRAPHY Right 11/14/2019   Procedure: LOWER EXTREMITY ANGIOGRAPHY;  Surgeon: Annice Needy, MD;  Location: [facility] INVASIVE CV LAB;  Service: Cardiovascular;  Laterality: Right;  . STOMACH SURGERY    . TONSILECTOMY, ADENOIDECTOMY, BILATERAL MYRINGOTOMY AND TUBES        No Known Allergies  Current Outpatient Medications  Medication Sig Dispense  Refill  . aspirin EC 81 MG tablet Take 1 tablet (81 mg total) by mouth daily. 150 tablet 2  . atorvastatin (LIPITOR) 10 MG tablet Take 1 tablet (10 mg total) by mouth daily. 30 tablet 11  . clindamycin (CLEOCIN) 300 MG capsule Take 1 capsule (300 mg total) by mouth 3 (three) times daily. 21 capsule 0  . clopidogrel (PLAVIX) 75 MG tablet Take 1 tablet (75 mg total) by mouth daily. 30 tablet 11   No current facility-administered medications for this visit.        Physical Exam BP 139/80   Pulse 70   Resp 16   Ht 6\' 1"  (1.854 m)   Wt 164 lb (74.4 kg)   BMI 21.64 kg/m  Gen:  WD/WN, NAD Skin: incision C/D/I about half of the staples were removed today Ext: Feet are warm.  Right foot bandage remains intact.  He has a palpable posterior tibial pulse on the right and a palpable dorsalis pedis pulse on the left.    Assessment/Plan:  Atherosclerosis of native arteries of extremity with rest pain [facility] Doing well status post extensive revascularization including bilateral femoral endarterectomies, and bilateral endovascular revascularization.  Status post right fifth digit amputation by podiatry and he will follow up with them.  Half of his staples were removed today and we will bring him back in about a week to take the other half out and check his ABIs.      [facility] 12/16/2019, 9:44  AM   This note was created with Dragon medical transcription system.  Any errors from dictation are unintentional.

## 2019-12-16 NOTE — Patient Instructions (Signed)
Peripheral Vascular Disease  Peripheral vascular disease (PVD) is a disease of the blood vessels that are not part of your heart and brain. A simple term for PVD is poor circulation. In most cases, PVD narrows the blood vessels that carry blood from your heart to the rest of your body. This can reduce the supply of blood to your arms, legs, and internal organs, like your stomach or kidneys. However, PVD most often affects a person's lower legs and feet. Without treatment, PVD tends to get worse. PVD can also lead to acute ischemic limb. This is when an arm or leg suddenly cannot get enough blood. This is a medical emergency. Follow these instructions at home: Lifestyle  Do not use any products that contain nicotine or tobacco, such as cigarettes and e-cigarettes. If you need help quitting, ask your doctor.  Lose weight if you are overweight. Or, stay at a healthy weight as told by your doctor.  Eat a diet that is low in fat and cholesterol. If you need help, ask your doctor.  Exercise regularly. Ask your doctor for activities that are right for you. General instructions  Take over-the-counter and prescription medicines only as told by your doctor.  Take good care of your feet: ? Wear comfortable shoes that fit well. ? Check your feet often for any cuts or sores.  Keep all follow-up visits as told by your doctor This is important. Contact a doctor if:  You have cramps in your legs when you walk.  You have leg pain when you are at rest.  You have coldness in a leg or foot.  Your skin changes.  You are unable to get or have an erection (erectile dysfunction).  You have cuts or sores on your feet that do not heal. Get help right away if:  Your arm or leg turns cold, numb, and blue.  Your arms or legs become red, warm, swollen, painful, or numb.  You have chest pain.  You have trouble breathing.  You suddenly have weakness in your face, arm, or leg.  You become very  confused or you cannot speak.  You suddenly have a very bad headache.  You suddenly cannot see. Summary  Peripheral vascular disease (PVD) is a disease of the blood vessels.  A simple term for PVD is poor circulation. Without treatment, PVD tends to get worse.  Treatment may include exercise, low fat and low cholesterol diet, and quitting smoking. This information is not intended to replace advice given to you by your health care provider. Make sure you discuss any questions you have with your health care provider. Document Revised: 09/14/2017 Document Reviewed: 11/09/2016 Elsevier Patient Education  2020 [employer]  

## 2019-12-29 ENCOUNTER — Ambulatory Visit (INDEPENDENT_AMBULATORY_CARE_PROVIDER_SITE_OTHER): Payer: Self-pay

## 2019-12-29 ENCOUNTER — Other Ambulatory Visit: Payer: Self-pay

## 2019-12-29 ENCOUNTER — Ambulatory Visit (INDEPENDENT_AMBULATORY_CARE_PROVIDER_SITE_OTHER): Payer: Self-pay | Admitting: Nurse Practitioner

## 2019-12-29 ENCOUNTER — Encounter (INDEPENDENT_AMBULATORY_CARE_PROVIDER_SITE_OTHER): Payer: Self-pay | Admitting: Nurse Practitioner

## 2019-12-29 VITALS — BP 90/61 | HR 52 | Resp 16 | Wt 165.4 lb

## 2019-12-29 DIAGNOSIS — I70221 Atherosclerosis of native arteries of extremities with rest pain, right leg: Secondary | ICD-10-CM

## 2019-12-29 DIAGNOSIS — F17200 Nicotine dependence, unspecified, uncomplicated: Secondary | ICD-10-CM

## 2019-12-29 DIAGNOSIS — G458 Other transient cerebral ischemic attacks and related syndromes: Secondary | ICD-10-CM

## 2020-01-01 ENCOUNTER — Encounter (INDEPENDENT_AMBULATORY_CARE_PROVIDER_SITE_OTHER): Payer: Self-pay | Admitting: Nurse Practitioner

## 2020-01-01 NOTE — Progress Notes (Signed)
SUBJECTIVE:  Patient ID: Brendan Williams, male    DOB: 23-Sep-1961, 59 y.o.   MRN: [id number] Chief Complaint  Patient presents with  . Follow-up    HPI  MARTESE Williams is a 59 y.o. male the presents to the office today for noninvasive studies as well as staple removal following bilateral endarterectomy with extensive intervention on 11/28/2019, including:  1. Bilateral common femoral, superficial femoral and profunda femoris endarterectomy with Cormatrix patch angioplasty 2. Fogarty embolectomy of the right common and external iliac artery using a #4 Fogarty balloon catheter 3. Fogarty embolectomy of the left common iliac and external iliac arteries using a #3 and a #4 Fogarty balloon catheter. 4. Catheter placement into the aorta from both right common femoral and left common femoral approach 5. Aortogram with bilateral lower extremity angiogram 6. Placement of bilateral common iliac artery stents kissing balloon technique using a 9 x 58 lifestream stent initially but later in the case extending proximally with a 12 x 58 lifestream stent bilaterally. 7. Placement of bilateral 9 mm diameter Viabahn stents in the right and left external iliac arteries. 8. Stent placement right SFA with a 6 mm x 25 cm length Viabahn stent 9. Angioplasty right anterior tibial artery to 3 mm 10. 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  The patient recently visited his podiatrist and all staples removed with Steri-Strips placed.  The wound looks nearly healed today.  The patient also reports that his claudication-like symptoms are essentially gone and that his legs feel better than they have a long time.  Only complaint that the patient has at this time is some continued numbness of his lower extremities.  He denies any fever, chills, nausea, vomiting or  diarrhea.  He denies any drainage from his wounds.  He denies any big issues from his wounds.  The bilateral wounds still are clean dry and intact without any issue.  Today patient underwent bilateral ABIs which revealed an ABI of 1.05 on the right and 1.13 on the left.  Subsequently was also noted that the patient had a greater than 30 mmHg difference in between his brachial blood pressures.  This would suggest subclavian steal within the right upper extremity.  Past Medical History:  Diagnosis Date  . Peripheral arterial disease [facility])     Past Surgical History:  Procedure Laterality Date  . AMPUTATION TOE Right 12/02/2019   Procedure: AMPUTATION RIGHT 5TH TOE;  Surgeon: Caroline More, DPM;  Location: [facility] ORS;  Service: Podiatry;  Laterality: Right;  . APPLICATION OF WOUND VAC Bilateral 11/28/2019   Procedure: APPLICATION OF WOUND VAC;  Surgeon: Algernon Huxley, MD;  Location: [facility] ORS;  Service: Vascular;  Laterality: Bilateral;  Prevena   . BACK SURGERY     lower ruptured disk  . CENTRAL VENOUS CATHETER INSERTION  11/28/2019   Procedure: INSERTION CENTRAL LINE ADULT;  Surgeon: Algernon Huxley, MD;  Location: [facility] ORS;  Service: Vascular;;  . ENDARTERECTOMY FEMORAL Bilateral 11/28/2019   Procedure: ENDARTERECTOMY FEMORAL;  Surgeon: Algernon Huxley, MD;  Location: [facility] ORS;  Service: Vascular;  Laterality: Bilateral;  . LOWER EXTREMITY ANGIOGRAPHY Right 11/14/2019   Procedure: LOWER EXTREMITY ANGIOGRAPHY;  Surgeon: Algernon Huxley, MD;  Location: [place] [facility];  Service: Cardiovascular;  Laterality: Right;  . STOMACH SURGERY    .  TONSILECTOMY, ADENOIDECTOMY, BILATERAL MYRINGOTOMY AND TUBES      Social History   Socioeconomic History  . Marital status: Married    Spouse name: Not on file  . Number of children: Not on file  . Years of education: Not on file  . Highest education level: Not on file  Occupational History  . Not on file  Tobacco Use  . Smoking status: Current Every Day Smoker     Packs/day: 1.00    Types: Cigarettes  . Smokeless tobacco: Never Used  Substance and Sexual Activity  . Alcohol use: Yes    Alcohol/week: 5.0 standard drinks    Types: 5 Cans of beer per week    Comment: 4-5 beers a week  . Drug use: Yes    Frequency: 3.0 times per week    Types: Marijuana    Comment: 3-4 times a week  . Sexual activity: Yes  Other Topics Concern  . Not on file  Social History Narrative   Live in private residence with wife   Social Determinants of Health   Financial Resource Strain:   . Difficulty of Paying Living Expenses:   Food Insecurity:   . Worried About Programme researcher, broadcasting/film/video in the Last Year:   . Barista in the Last Year:   Transportation Needs:   . Freight forwarder (Medical):   Marland Kitchen Lack of Transportation (Non-Medical):   Physical Activity:   . Days of Exercise per Week:   . Minutes of Exercise per Session:   Stress:   . Feeling of Stress :   Social Connections:   . Frequency of Communication with Friends and Family:   . Frequency of Social Gatherings with Friends and Family:   . Attends Religious Services:   . Active Member of Clubs or Organizations:   . Attends Banker Meetings:   Marland Kitchen Marital Status:   Intimate Partner Violence:   . Fear of Current or Ex-Partner:   . Emotionally Abused:   Marland Kitchen Physically Abused:   . Sexually Abused:     Family History  Problem Relation Age of Onset  . Leukemia Mother   . Heart disease Father        a. CABG  . Heart disease Sister        a. MVR  . Cancer Sister   . Cancer Brother   . CAD Brother        a. CABG    No Known Allergies   Review of Systems   Review of Systems: Negative Unless Checked Constitutional: [] Weight loss  [] Fever  [] Chills Cardiac: [] Chest pain   []  Atrial Fibrillation  [] Palpitations   [] Shortness of breath when laying flat   [] Shortness of breath with exertion. [] Shortness of breath at rest Vascular:  [] Pain in legs with walking   [] Pain in legs  with standing [] Pain in legs when laying flat   [] Claudication    [] Pain in feet when laying flat    [] History of DVT   [] Phlebitis   [] Swelling in legs   [] Varicose veins   [] Non-healing ulcers Pulmonary:   [] Uses home oxygen   [] Productive cough   [] Hemoptysis   [] Wheeze  [] COPD   [] Asthma Neurologic:  [] Dizziness   [] Seizures  [] Blackouts [] History of stroke   [] History of TIA  [] Aphasia   [] Temporary Blindness   [] Weakness or numbness in arm   [x] Weakness or numbness in leg Musculoskeletal:   [] Joint swelling   [] Joint pain   []   Low back pain  []  History of Knee Replacement [] Arthritis [] back Surgeries  []  Spinal Stenosis    Hematologic:  [] Easy bruising  [] Easy bleeding   [] Hypercoagulable state   [] Anemic Gastrointestinal:  [] Diarrhea   [] Vomiting  [] Gastroesophageal reflux/heartburn   [] Difficulty swallowing. [] Abdominal pain Genitourinary:  [] Chronic kidney disease   [] Difficult urination  [] Anuric   [] Blood in urine [] Frequent urination  [] Burning with urination   [] Hematuria Skin:  [] Rashes   [] Ulcers [x] Wounds Psychological:  [] History of anxiety   []  History of major depression  []  Memory Difficulties      OBJECTIVE:   Physical Exam  BP 90/61 (BP Location: Right Arm)   Pulse (!) 52   Resp 16   Wt 165 lb 6.4 oz (75 kg)   BMI 21.82 kg/m   Gen: WD/WN, NAD Head: [place]/AT, No temporalis wasting.  Ear/Nose/Throat: Hearing grossly intact, nares w/o erythema or drainage Eyes: PER, EOMI, sclera nonicteric.  Neck: Supple, no masses.  No JVD.  Pulmonary:  Good air movement, no use of accessory muscles.  Cardiac: RRR Vascular:  Nearly healed bilateral groin incisions with no evidence of infection Vessel Right Left  Radial Palpable Palpable  Dorsalis Pedis Palpable Palpable  Posterior Tibial Palpable Palpable   Gastrointestinal: soft, non-distended. No guarding/no peritoneal signs.  Musculoskeletal: M/S 5/5 throughout.  No deformity or atrophy.  Neurologic: Pain and light touch intact  in extremities.  Symmetrical.  Speech is fluent. Motor exam as listed above. Psychiatric: Judgment intact, Mood & affect appropriate for pt's clinical situation. Dermatologic: No Venous rashes. No Ulcers Noted.  No changes consistent with cellulitis. Lymph : No Cervical lymphadenopathy, no lichenification or skin changes of chronic lymphedema.       ASSESSMENT AND PLAN:  1. Atherosclerosis of native artery of right lower extremity with rest pain (HCC)  Recommend:  The patient has evidence of atherosclerosis of the lower extremities with claudication.  The patient does not voice lifestyle limiting changes at this point in time.  Noninvasive studies do not suggest clinically significant change.  No invasive studies, angiography or surgery at this time The patient should continue walking and begin a more formal exercise program.  The patient should continue antiplatelet therapy and aggressive treatment of the lipid abnormalities  No changes in the patient's medications at this time  The patient should continue wearing graduated compression socks 10-15 mmHg strength to control the mild edema.   The patient return in 3 months for noninvasive studies.  2. Tobacco use disorder Smoking cessation was discussed, 3-10 minutes spent on this topic specifically.  Patient also informed that continued smoking will continue to cause atherosclerotic disease formation which may necessitate further intervention.   3. 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  We will continue to monitor and intervene if necessary.    Current Outpatient Medications on File Prior to Visit  Medication Sig Dispense Refill  . aspirin EC 81 MG tablet Take 1 tablet (81 mg total) by mouth daily. 150 tablet 2  . atorvastatin (LIPITOR) 10 MG tablet Take 1 tablet (10 mg total) by mouth daily. 30 tablet 11  . clopidogrel (PLAVIX) 75 MG tablet Take 1 tablet (75 mg  total) by mouth daily. 30 tablet 11  . clindamycin (CLEOCIN) 300 MG capsule Take 1 capsule (300 mg total) by mouth 3 (three) times daily. (Patient not taking: Reported on 12/29/2019) 21 capsule 0   No current facility-administered medications on file prior to visit.  There are no Patient Instructions on file for this visit. No follow-ups on file.   Kris Hartmann, NP  This note was completed with Sales executive.  Any errors are purely unintentional.

## 2020-01-22 ENCOUNTER — Encounter (INDEPENDENT_AMBULATORY_CARE_PROVIDER_SITE_OTHER): Payer: Self-pay | Admitting: Nurse Practitioner

## 2020-03-08 ENCOUNTER — Other Ambulatory Visit (INDEPENDENT_AMBULATORY_CARE_PROVIDER_SITE_OTHER): Payer: Self-pay | Admitting: Nurse Practitioner

## 2020-03-08 DIAGNOSIS — Z95820 Peripheral vascular angioplasty status with implants and grafts: Secondary | ICD-10-CM

## 2020-03-08 DIAGNOSIS — I70221 Atherosclerosis of native arteries of extremities with rest pain, right leg: Secondary | ICD-10-CM

## 2020-03-10 ENCOUNTER — Encounter (INDEPENDENT_AMBULATORY_CARE_PROVIDER_SITE_OTHER): Payer: Self-pay | Admitting: Nurse Practitioner

## 2020-03-10 ENCOUNTER — Ambulatory Visit (INDEPENDENT_AMBULATORY_CARE_PROVIDER_SITE_OTHER): Payer: Self-pay | Admitting: Nurse Practitioner

## 2020-03-10 ENCOUNTER — Other Ambulatory Visit: Payer: Self-pay

## 2020-03-10 ENCOUNTER — Ambulatory Visit (INDEPENDENT_AMBULATORY_CARE_PROVIDER_SITE_OTHER): Payer: Self-pay

## 2020-03-10 VITALS — BP 103/70 | HR 61 | Ht 73.0 in | Wt 172.0 lb

## 2020-03-10 DIAGNOSIS — Z95820 Peripheral vascular angioplasty status with implants and grafts: Secondary | ICD-10-CM

## 2020-03-10 DIAGNOSIS — I70221 Atherosclerosis of native arteries of extremities with rest pain, right leg: Secondary | ICD-10-CM

## 2020-03-10 DIAGNOSIS — F17200 Nicotine dependence, unspecified, uncomplicated: Secondary | ICD-10-CM

## 2020-03-12 ENCOUNTER — Encounter (INDEPENDENT_AMBULATORY_CARE_PROVIDER_SITE_OTHER): Payer: Self-pay | Admitting: Nurse Practitioner

## 2020-03-12 NOTE — Progress Notes (Signed)
Subjective:    Patient ID: Brendan Williams, male    DOB: 01/25/1961, 59 y.o.   MRN: [id number] Chief Complaint  Patient presents with  . Follow-up    U/S follow up    The patient returns to the office for followup and review of the noninvasive studies. There have been no interval changes in lower extremity symptoms. No interval shortening of the patient's claudication distance or development of rest pain symptoms. No new ulcers or wounds have occurred since the last visit.  The amputation site on the patient's right lower extremity is beginning to heal.  The patient still continues to have some numbness in his lower extremities however it is not painful, more annoying than anything.  There have been no significant changes to the patient's overall health care.  The patient denies amaurosis fugax or recent TIA symptoms. There are no recent neurological changes noted. The patient denies history of DVT, PE or superficial thrombophlebitis. The patient denies recent episodes of angina or shortness of breath.   ABI Rt=1.09 and Lt=1.15  (previous ABI's Rt=1.05 and Lt=1.13) Duplex ultrasound of the left lower extremity reveals biphasic waveforms in the tibial arteries with monophasic/biphasic waveforms in the right tibial arteries.  The patient has good toe waveforms bilaterally.   Review of Systems  Neurological: Positive for numbness.  All other systems reviewed and are negative.      Objective:   Physical Exam Vitals reviewed.  HENT:     Head: Normocephalic.  Cardiovascular:     Rate and Rhythm: Normal rate and regular rhythm.     Pulses:          Dorsalis pedis pulses are 1+ on the right side and 1+ on the left side.       Posterior tibial pulses are 1+ on the right side and 1+ on the left side.  Pulmonary:     Effort: Pulmonary effort is normal.     Breath sounds: Normal breath sounds.  Feet:     Comments: Right small toe amputation Skin:    General: Skin is warm and dry.    Neurological:     Mental Status: He is alert and oriented to person, place, and time.  Psychiatric:        Mood and Affect: Mood normal.        Behavior: Behavior normal.        Thought Content: Thought content normal.        Judgment: Judgment normal.     BP 103/70   Pulse 61   Ht 6\' 1"  (1.854 m)   Wt 172 lb (78 kg)   BMI 22.69 kg/m   Past Medical History:  Diagnosis Date  . Peripheral arterial disease ([place])     Social History   Socioeconomic History  . Marital status: Married    Spouse name: Not on file  . Number of children: Not on file  . Years of education: Not on file  . Highest education level: Not on file  Occupational History  . Not on file  Tobacco Use  . Smoking status: Current Every Day Smoker    Packs/day: 1.00    Types: Cigarettes  . Smokeless tobacco: Never Used  Substance and Sexual Activity  . Alcohol use: Yes    Alcohol/week: 5.0 standard drinks    Types: 5 Cans of beer per week    Comment: 4-5 beers a week  . Drug use: Yes    Frequency: 3.0 times per week  Types: Marijuana    Comment: 3-4 times a week  . Sexual activity: Yes  Other Topics Concern  . Not on file  Social History Narrative   Live in private residence with wife   Social Determinants of Health   Financial Resource Strain:   . Difficulty of Paying Living Expenses:   Food Insecurity:   . Worried About Programme researcher, broadcasting/film/video in the Last Year:   . Barista in the Last Year:   Transportation Needs:   . Freight forwarder (Medical):   Marland Kitchen Lack of Transportation (Non-Medical):   Physical Activity:   . Days of Exercise per Week:   . Minutes of Exercise per Session:   Stress:   . Feeling of Stress :   Social Connections:   . Frequency of Communication with Friends and Family:   . Frequency of Social Gatherings with Friends and Family:   . Attends Religious Services:   . Active Member of Clubs or Organizations:   . Attends Banker Meetings:   Marland Kitchen  Marital Status:   Intimate Partner Violence:   . Fear of Current or Ex-Partner:   . Emotionally Abused:   Marland Kitchen Physically Abused:   . Sexually Abused:     Past Surgical History:  Procedure Laterality Date  . AMPUTATION TOE Right 12/02/2019   Procedure: AMPUTATION RIGHT 5TH TOE;  Surgeon: Rosetta Posner, DPM;  Location: [facility];  Service: Podiatry;  Laterality: Right;  . APPLICATION OF WOUND VAC Bilateral 11/28/2019   Procedure: APPLICATION OF WOUND VAC;  Surgeon: Annice Needy, MD;  Location: [facility] OR[facility];  Service: Vascular;  Laterality: Bilateral;  Prevena   . BACK SURGERY     lower ruptured disk  . CENTRAL VENOUS CATHETER INSERTION  11/28/2019   Procedure: INSERTION CENTRAL LINE ADULT;  Surgeon: Annice Needy, MD;  Location: [facility] OR[facility];  Service: Vascular;;  . ENDARTERECTOMY FEMORAL Bilateral 11/28/2019   Procedure: ENDARTERECTOMY FEMORAL;  Surgeon: Annice Needy, MD;  Location: [facility];  Service: Vascular;  Laterality: Bilateral;  . LOWER EXTREMITY ANGIOGRAPHY Right 11/14/2019   Procedure: LOWER EXTREMITY ANGIOGRAPHY;  Surgeon: Annice Needy, MD;  Location: [facility];  Service: Cardiovascular;  Laterality: Right;  . STOMACH SURGERY    . TONSILECTOMY, ADENOIDECTOMY, BILATERAL MYRINGOTOMY AND TUBES      Family History  Problem Relation Age of Onset  . Leukemia Mother   . Heart disease Father        a. CABG  . Heart disease Sister        a. MVR  . Cancer Sister   . Cancer Brother   . CAD Brother        a. CABG    No Known Allergies     Assessment & Plan:   1. Atherosclerosis of native artery of right lower extremity with rest pain (HCC)  Recommend:  The patient has evidence of atherosclerosis of the lower extremities with claudication.  The patient does not voice lifestyle limiting changes at this point in time.  Noninvasive studies do not suggest clinically significant change.  No invasive studies, angiography or surgery at this time The patient should continue walking  and begin a more formal exercise program.  The patient should continue antiplatelet therapy and aggressive treatment of the lipid abnormalities  No changes in the patient's medications at this time  The patient should continue wearing graduated compression socks 10-15 mmHg strength to control the mild edema.   Patient will  return in 6 months with noninvasive studies.  2. Tobacco use disorder Smoking cessation was discussed, 3-10 minutes spent on this topic specifically    Current Outpatient Medications on File Prior to Visit  Medication Sig Dispense Refill  . aspirin EC 81 MG tablet Take 1 tablet (81 mg total) by mouth daily. 150 tablet 2  . atorvastatin (LIPITOR) 10 MG tablet Take 1 tablet (10 mg total) by mouth daily. 30 tablet 11  . clopidogrel (PLAVIX) 75 MG tablet Take 1 tablet (75 mg total) by mouth daily. 30 tablet 11  . clindamycin (CLEOCIN) 300 MG capsule Take 1 capsule (300 mg total) by mouth 3 (three) times daily. (Patient not taking: Reported on 12/29/2019) 21 capsule 0   No current facility-administered medications on file prior to visit.    There are no Patient Instructions on file for this visit. No follow-ups on file.   Georgiana Spinner, NP

## 2020-03-30 ENCOUNTER — Encounter (INDEPENDENT_AMBULATORY_CARE_PROVIDER_SITE_OTHER): Payer: Self-pay

## 2020-03-30 ENCOUNTER — Ambulatory Visit (INDEPENDENT_AMBULATORY_CARE_PROVIDER_SITE_OTHER): Payer: Self-pay | Admitting: Vascular Surgery

## 2020-08-02 ENCOUNTER — Telehealth (INDEPENDENT_AMBULATORY_CARE_PROVIDER_SITE_OTHER): Payer: Self-pay | Admitting: Vascular Surgery

## 2020-08-02 NOTE — Telephone Encounter (Signed)
We can move up his appointment with ABIs to see me or Dew.  In reviewing the previous office notes, the patient had numbness previously.  A loss of balance may not be related to his blood flow.  If his studies are normal, you will need to reach out to his PCP for further workup

## 2020-08-02 NOTE — Telephone Encounter (Signed)
Wife called stating that patient has been complaining of his feet for the past two weeks. He mentioned that he doesn't have any feeling in his right foot and she has noticed his balance to be unstable. Patient was last seen with abi studies (FB). Wife would like patient to come in to be seen. Please advise.

## 2020-08-10 ENCOUNTER — Other Ambulatory Visit (INDEPENDENT_AMBULATORY_CARE_PROVIDER_SITE_OTHER): Payer: Self-pay | Admitting: Nurse Practitioner

## 2020-08-10 DIAGNOSIS — R200 Anesthesia of skin: Secondary | ICD-10-CM

## 2020-08-11 ENCOUNTER — Ambulatory Visit (INDEPENDENT_AMBULATORY_CARE_PROVIDER_SITE_OTHER): Payer: Self-pay

## 2020-08-11 ENCOUNTER — Ambulatory Visit (INDEPENDENT_AMBULATORY_CARE_PROVIDER_SITE_OTHER): Payer: Self-pay | Admitting: Nurse Practitioner

## 2020-08-11 ENCOUNTER — Other Ambulatory Visit: Payer: Self-pay

## 2020-08-11 ENCOUNTER — Encounter (INDEPENDENT_AMBULATORY_CARE_PROVIDER_SITE_OTHER): Payer: Self-pay | Admitting: Nurse Practitioner

## 2020-08-11 VITALS — BP 128/70 | HR 60 | Resp 16 | Wt 169.0 lb

## 2020-08-11 DIAGNOSIS — R200 Anesthesia of skin: Secondary | ICD-10-CM

## 2020-08-11 DIAGNOSIS — F17200 Nicotine dependence, unspecified, uncomplicated: Secondary | ICD-10-CM

## 2020-08-11 DIAGNOSIS — I70221 Atherosclerosis of native arteries of extremities with rest pain, right leg: Secondary | ICD-10-CM

## 2020-08-11 DIAGNOSIS — G629 Polyneuropathy, unspecified: Secondary | ICD-10-CM

## 2020-08-11 MED ORDER — GABAPENTIN 300 MG PO CAPS: 300.0000 mg | ORAL_CAPSULE | Freq: Two times a day (BID) | ORAL | 6 refills | Status: DC

## 2020-08-15 ENCOUNTER — Encounter (INDEPENDENT_AMBULATORY_CARE_PROVIDER_SITE_OTHER): Payer: Self-pay | Admitting: Nurse Practitioner

## 2020-08-15 NOTE — Progress Notes (Signed)
Subjective:    Patient ID: Brendan Williams, male    DOB: [birth year], 59 y.o.   MRN: [id number] Chief Complaint  Patient presents with  . Follow-up    ultrasound follow up    The patient returns to the office for followup and review of the noninvasive studies. There have been no interval changes in lower extremity symptoms. No interval shortening of the patient's claudication distance or development of rest pain symptoms. No new ulcers or wounds have occurred since the last visit.  There have been no significant changes to the patient's overall health care.  The patient's only complaint is of persistent severe neuropathy.  The patient denies amaurosis fugax or recent TIA symptoms. There are no recent neurological changes noted. The patient denies history of DVT, PE or superficial thrombophlebitis. The patient denies recent episodes of angina or shortness of breath.   ABI Rt=1.13 and Lt= 1.21 (previous ABI's Rt= 1.09 and Lt= 1.15) Duplex ultrasound of the tibial arteries shows biphasic waveforms in the bilateral lower extremity with strong toe waveforms bilaterally.   Review of Systems  Skin: Negative for wound.  All other systems reviewed and are negative.      Objective:   Physical Exam Vitals reviewed.  HENT:     Head: Normocephalic.  Cardiovascular:     Rate and Rhythm: Normal rate.     Pulses: Normal pulses.  Pulmonary:     Effort: Pulmonary effort is normal.  Musculoskeletal:        General: Normal range of motion.  Skin:    General: Skin is warm and dry.  Neurological:     Mental Status: He is alert and oriented to person, place, and time.  Psychiatric:        Mood and Affect: Mood normal.        Behavior: Behavior normal.        Thought Content: Thought content normal.        Judgment: Judgment normal.     BP 128/70 (BP Location: Left Arm)   Pulse 60   Resp 16   Wt 169 lb (76.7 kg)   BMI 22.30 kg/m   Past Medical History:  Diagnosis Date  .  Peripheral arterial disease (HCC)     Social History   Socioeconomic History  . Marital status: Married    Spouse name: Not on file  . Number of children: Not on file  . Years of education: Not on file  . Highest education level: Not on file  Occupational History  . Not on file  Tobacco Use  . Smoking status: Current Every Day Smoker    Packs/day: 1.00    Types: Cigarettes  . Smokeless tobacco: Never Used  Vaping Use  . Vaping Use: Never used  Substance and Sexual Activity  . Alcohol use: Yes    Alcohol/week: 5.0 standard drinks    Types: 5 Cans of beer per week    Comment: 4-5 beers a week  . Drug use: Yes    Frequency: 3.0 times per week    Types: Marijuana    Comment: 3-4 times a week  . Sexual activity: Yes  Other Topics Concern  . Not on file  Social History Narrative   Live in private residence with wife   Social Determinants of Health   Financial Resource Strain:   . Difficulty of Paying Living Expenses: Not on file  Food Insecurity:   . Worried About Programme researcher, broadcasting/film/video in the Last Year:  Not on file  . Ran Out of Food in the Last Year: Not on file  Transportation Needs:   . Lack of Transportation (Medical): Not on file  . Lack of Transportation (Non-Medical): Not on file  Physical Activity:   . Days of Exercise per Week: Not on file  . Minutes of Exercise per Session: Not on file  Stress:   . Feeling of Stress : Not on file  Social Connections:   . Frequency of Communication with Friends and Family: Not on file  . Frequency of Social Gatherings with Friends and Family: Not on file  . Attends Religious Services: Not on file  . Active Member of Clubs or Organizations: Not on file  . Attends Banker Meetings: Not on file  . Marital Status: Not on file  Intimate Partner Violence:   . Fear of Current or Ex-Partner: Not on file  . Emotionally Abused: Not on file  . Physically Abused: Not on file  . Sexually Abused: Not on file    Past  Surgical History:  Procedure Laterality Date  . AMPUTATION TOE Right 12/02/2019   Procedure: AMPUTATION RIGHT 5TH TOE;  Surgeon: Rosetta Posner, DPM;  Location: [facility];  Service: Podiatry;  Laterality: Right;  . APPLICATION OF WOUND VAC Bilateral 11/28/2019   Procedure: APPLICATION OF WOUND VAC;  Surgeon: Annice Needy, MD;  Location: [facility];  Service: Vascular;  Laterality: Bilateral;  Prevena   . BACK SURGERY     lower ruptured disk  . CENTRAL VENOUS CATHETER INSERTION  11/28/2019   Procedure: INSERTION CENTRAL LINE ADULT;  Surgeon: Annice Needy, MD;  Location: [facility];  Service: Vascular;;  . ENDARTERECTOMY FEMORAL Bilateral 11/28/2019   Procedure: ENDARTERECTOMY FEMORAL;  Surgeon: Annice Needy, MD;  Location: [facility];  Service: Vascular;  Laterality: Bilateral;  . LOWER EXTREMITY ANGIOGRAPHY Right 11/14/2019   Procedure: LOWER EXTREMITY ANGIOGRAPHY;  Surgeon: Annice Needy, MD;  Location: [facility];  Service: Cardiovascular;  Laterality: Right;  . STOMACH SURGERY    . TONSILECTOMY, ADENOIDECTOMY, BILATERAL MYRINGOTOMY AND TUBES      Family History  Problem Relation Age of Onset  . Leukemia Mother   . Heart disease Father        a. CABG  . Heart disease Sister        a. MVR  . Cancer Sister   . Cancer Brother   . CAD Brother        a. CABG    No Known Allergies  CBC Latest Ref Rng & Units 12/04/2019 12/03/2019 12/02/2019  WBC 4.0 - 10.5 K/uL 9.7 10.9(H) 9.8  Hemoglobin 13.0 - 17.0 g/dL 8.2(L) 7.6(L) 8.1(L)  Hematocrit 39 - 52 % 24.9(L) 23.3(L) 23.9(L)  Platelets 150 - 400 K/uL 476(H) 384 350      CMP     Component Value Date/Time   NA 138 12/04/2019 0523   K 3.8 12/04/2019 0523   CL 105 12/04/2019 0523   CO2 26 12/04/2019 0523   GLUCOSE 111 (H) 12/04/2019 0523   BUN 7 12/04/2019 0523   CREATININE 0.67 12/04/2019 0523   CALCIUM 8.3 (L) 12/04/2019 0523   GFRNONAA >60 12/04/2019 0523   GFRAA >60 12/04/2019 0523         Assessment & Plan:   1.  Atherosclerosis of native artery of right lower extremity with rest pain (HCC)  Recommend:  The patient has evidence of atherosclerosis of the lower extremities with claudication.  The patient  does not voice lifestyle limiting changes at this point in time.  Noninvasive studies do not suggest clinically significant change.  No invasive studies, angiography or surgery at this time The patient should continue walking and begin a more formal exercise program.  The patient should continue antiplatelet therapy and aggressive treatment of the lipid abnormalities  No changes in the patient's medications at this time  The patient should continue wearing graduated compression socks 10-15 mmHg strength to control the mild edema.    2. Tobacco use disorder Smoking cessation was discussed, 3-10 minutes spent on this topic specifically   3. Neuropathy The patient has persistent neuropathy following severe ischemia prior to intervention.  We will begin the patient on gabapentin as he has tried this previously and it was helpful for his pain.   Current Outpatient Medications on File Prior to Visit  Medication Sig Dispense Refill  . aspirin EC 81 MG tablet Take 1 tablet (81 mg total) by mouth daily. 150 tablet 2  . atorvastatin (LIPITOR) 10 MG tablet Take 1 tablet (10 mg total) by mouth daily. 30 tablet 11  . clopidogrel (PLAVIX) 75 MG tablet Take 1 tablet (75 mg total) by mouth daily. 30 tablet 11  . clindamycin (CLEOCIN) 300 MG capsule Take 1 capsule (300 mg total) by mouth 3 (three) times daily. (Patient not taking: Reported on 12/29/2019) 21 capsule 0   No current facility-administered medications on file prior to visit.    There are no Patient Instructions on file for this visit. No follow-ups on file.   Georgiana Spinner, NP

## 2020-09-14 ENCOUNTER — Encounter (INDEPENDENT_AMBULATORY_CARE_PROVIDER_SITE_OTHER): Payer: MEDICAID

## 2020-09-14 ENCOUNTER — Ambulatory Visit (INDEPENDENT_AMBULATORY_CARE_PROVIDER_SITE_OTHER): Payer: MEDICAID | Admitting: Vascular Surgery

## 2020-09-26 IMAGING — DX DG FOOT 2V*R*
2 series · 2 of 2 positions shown · non-contrast
Comparison: None.

CLINICAL DATA: Right foot wound

EXAM:
RIGHT FOOT - 2 VIEW

[foot ap]
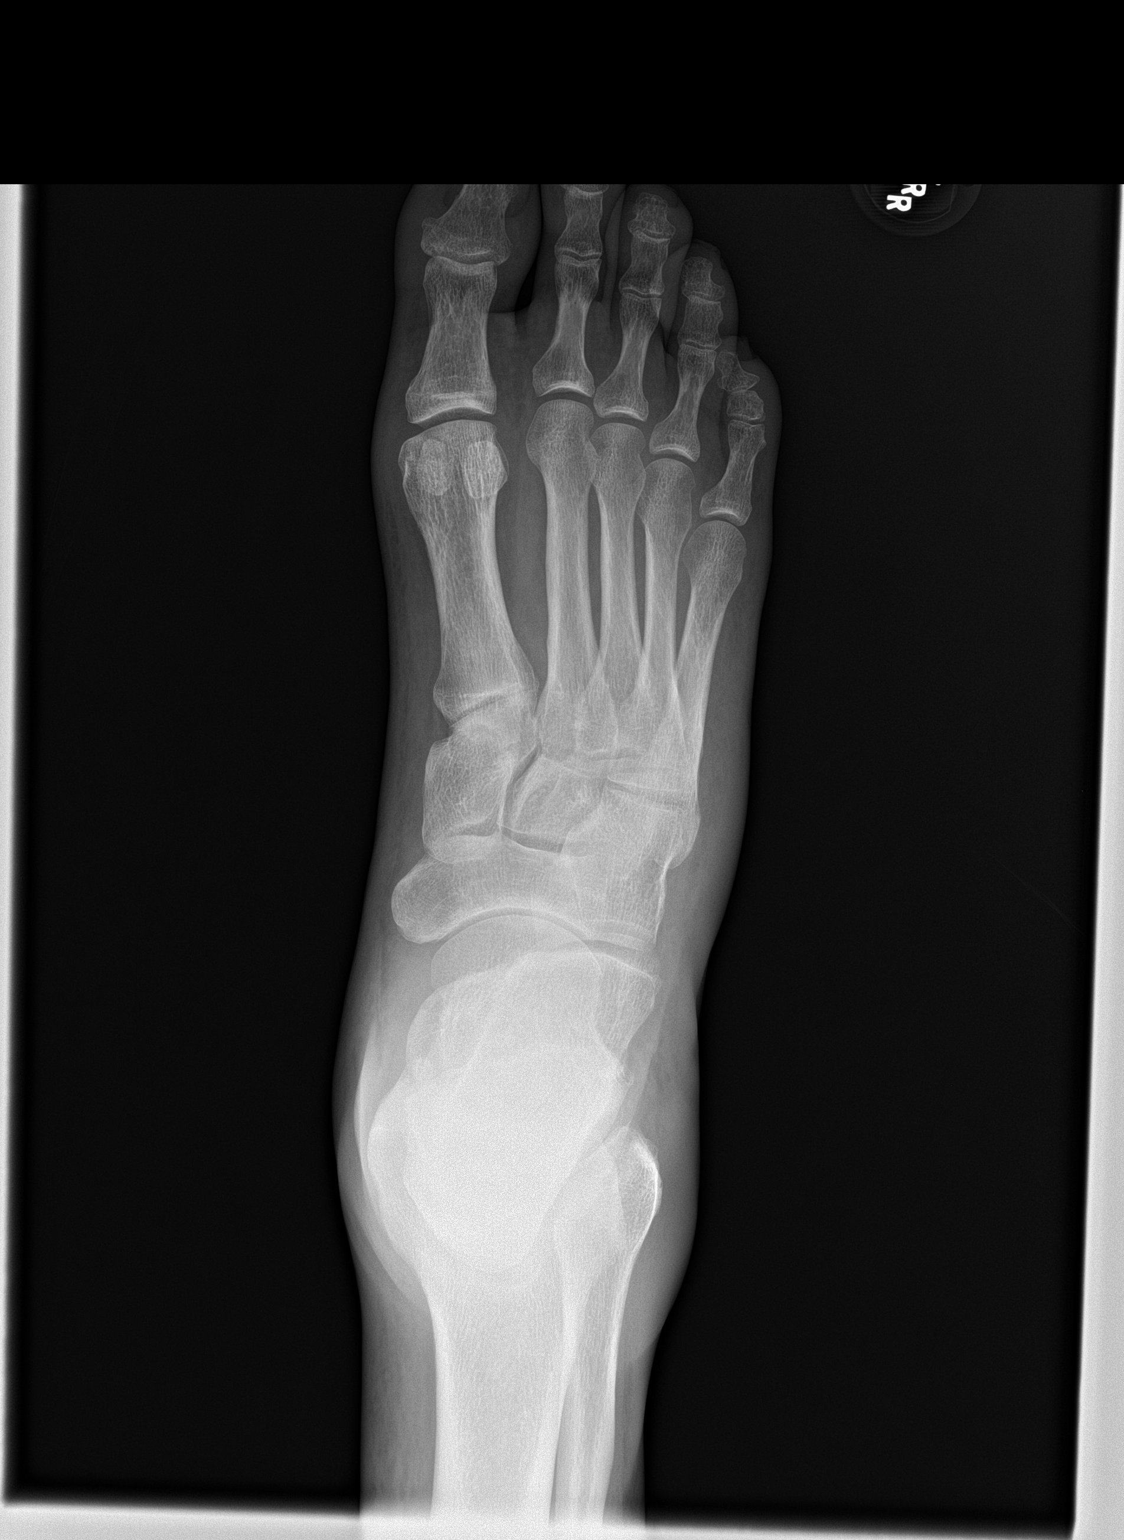

[foot lat]
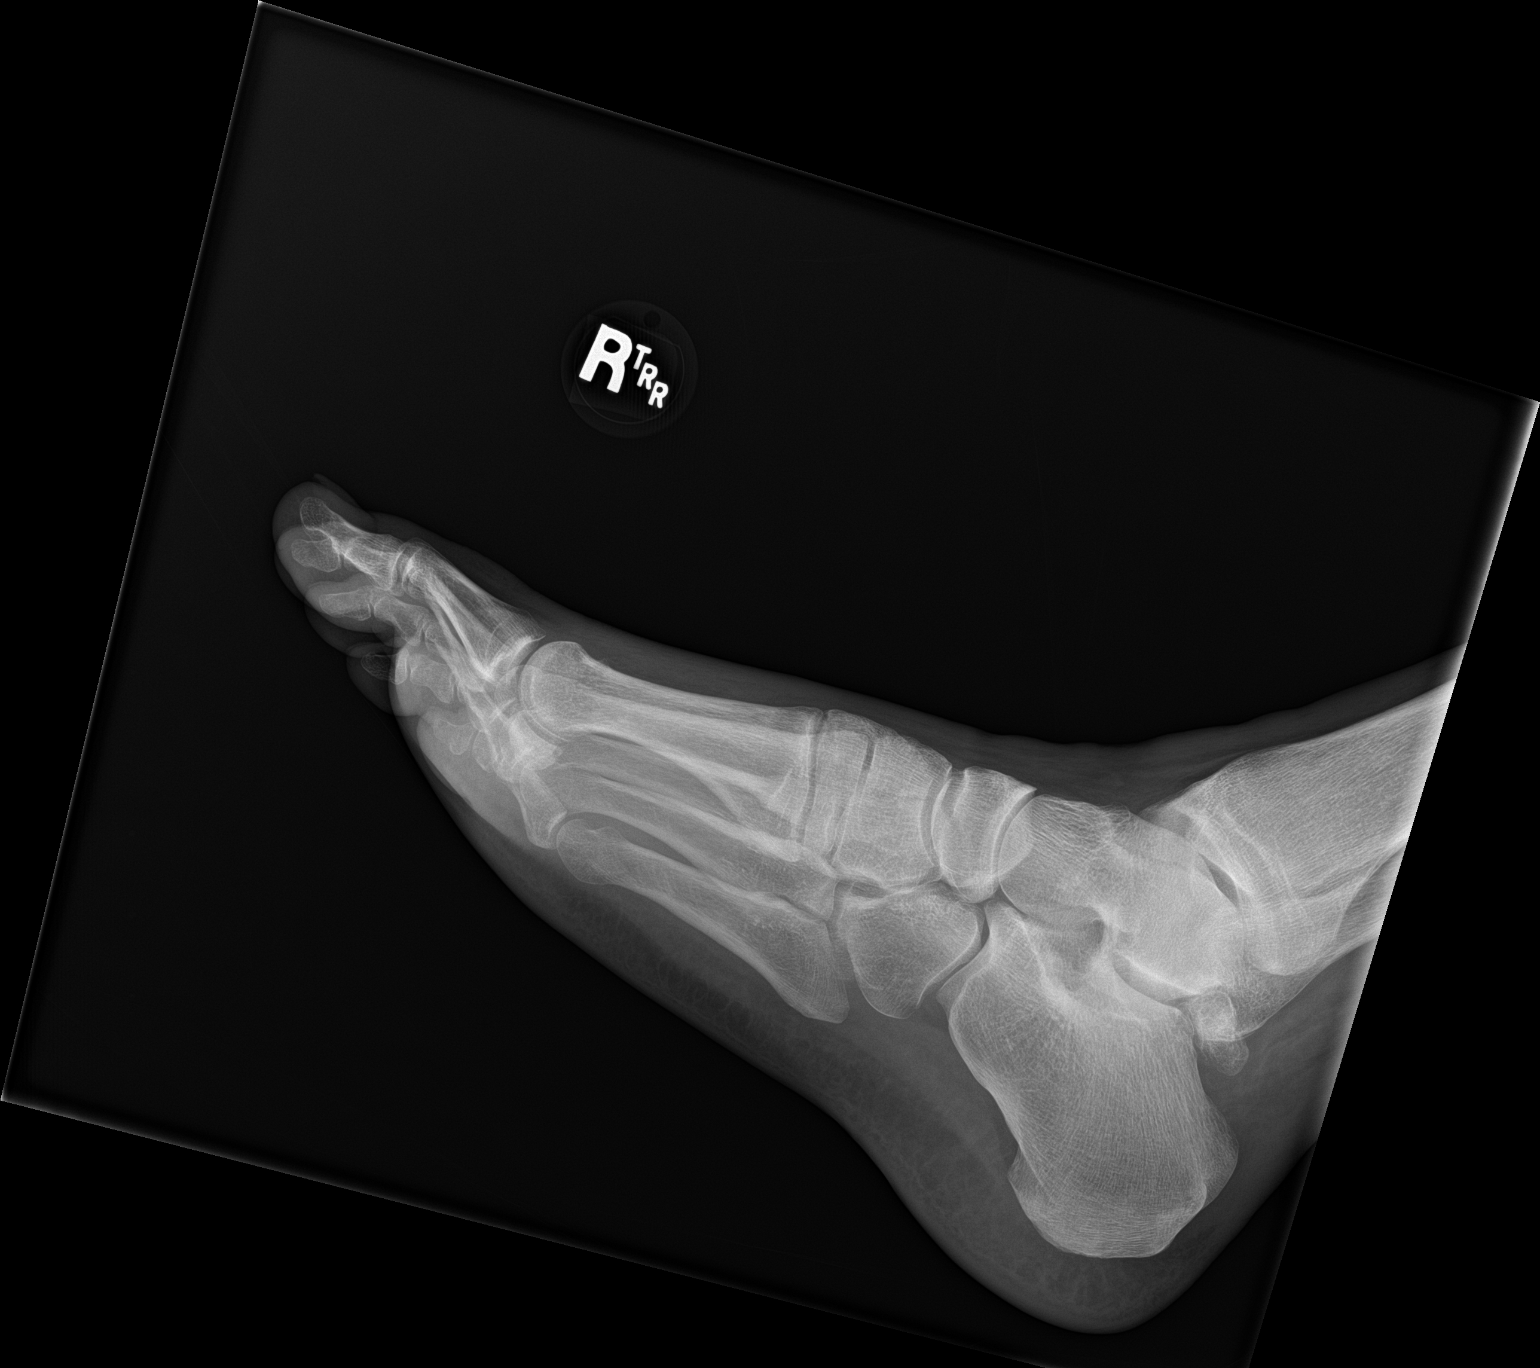

[2 of 2 positions shown; findings below may reference images not displayed]

FINDINGS: Frontal and lateral views of the right foot demonstrate no displaced
fractures. Alignment is anatomic. Joint spaces are well preserved.
Soft tissues are normal.
IMPRESSION: 1. Unremarkable right foot.

## 2021-01-31 ENCOUNTER — Other Ambulatory Visit (INDEPENDENT_AMBULATORY_CARE_PROVIDER_SITE_OTHER): Payer: Self-pay | Admitting: Nurse Practitioner

## 2021-01-31 DIAGNOSIS — I70221 Atherosclerosis of native arteries of extremities with rest pain, right leg: Secondary | ICD-10-CM

## 2021-02-01 ENCOUNTER — Ambulatory Visit (INDEPENDENT_AMBULATORY_CARE_PROVIDER_SITE_OTHER): Payer: MEDICAID | Admitting: Vascular Surgery

## 2021-02-01 ENCOUNTER — Encounter (INDEPENDENT_AMBULATORY_CARE_PROVIDER_SITE_OTHER): Payer: MEDICAID

## 2021-03-01 ENCOUNTER — Encounter (INDEPENDENT_AMBULATORY_CARE_PROVIDER_SITE_OTHER): Payer: MEDICAID

## 2021-03-01 ENCOUNTER — Encounter (INDEPENDENT_AMBULATORY_CARE_PROVIDER_SITE_OTHER): Payer: Self-pay | Admitting: Vascular Surgery

## 2021-03-01 ENCOUNTER — Ambulatory Visit (INDEPENDENT_AMBULATORY_CARE_PROVIDER_SITE_OTHER): Payer: MEDICAID | Admitting: Vascular Surgery

## 2021-04-01 ENCOUNTER — Encounter (INDEPENDENT_AMBULATORY_CARE_PROVIDER_SITE_OTHER): Payer: MEDICAID

## 2021-04-01 ENCOUNTER — Encounter (INDEPENDENT_AMBULATORY_CARE_PROVIDER_SITE_OTHER): Payer: Self-pay | Admitting: Vascular Surgery

## 2021-04-01 ENCOUNTER — Ambulatory Visit (INDEPENDENT_AMBULATORY_CARE_PROVIDER_SITE_OTHER): Payer: MEDICAID | Admitting: Vascular Surgery

## 2021-07-15 ENCOUNTER — Ambulatory Visit (INDEPENDENT_AMBULATORY_CARE_PROVIDER_SITE_OTHER): Payer: MEDICAID | Admitting: Vascular Surgery

## 2021-07-15 ENCOUNTER — Encounter (INDEPENDENT_AMBULATORY_CARE_PROVIDER_SITE_OTHER): Payer: MEDICAID

## 2021-07-18 ENCOUNTER — Encounter (INDEPENDENT_AMBULATORY_CARE_PROVIDER_SITE_OTHER): Payer: Self-pay | Admitting: Vascular Surgery

## 2021-09-01 ENCOUNTER — Encounter: Payer: Self-pay | Admitting: Emergency Medicine

## 2021-09-01 ENCOUNTER — Emergency Department
Admission: EM | Admit: 2021-09-01 | Discharge: 2021-09-01 | Disposition: A | Discharge status: Home / Self Care | Payer: Self-pay | Attending: Emergency Medicine | Admitting: Emergency Medicine

## 2021-09-01 ENCOUNTER — Other Ambulatory Visit: Payer: Self-pay

## 2021-09-01 DIAGNOSIS — Z20822 Contact with and (suspected) exposure to covid-19: Secondary | ICD-10-CM | POA: Insufficient documentation

## 2021-09-01 DIAGNOSIS — Z7982 Long term (current) use of aspirin: Secondary | ICD-10-CM | POA: Insufficient documentation

## 2021-09-01 DIAGNOSIS — R112 Nausea with vomiting, unspecified: Secondary | ICD-10-CM

## 2021-09-01 DIAGNOSIS — F17210 Nicotine dependence, cigarettes, uncomplicated: Secondary | ICD-10-CM | POA: Insufficient documentation

## 2021-09-01 DIAGNOSIS — I2510 Atherosclerotic heart disease of native coronary artery without angina pectoris: Secondary | ICD-10-CM | POA: Insufficient documentation

## 2021-09-01 DIAGNOSIS — B349 Viral infection, unspecified: Secondary | ICD-10-CM | POA: Insufficient documentation

## 2021-09-01 DIAGNOSIS — Z7902 Long term (current) use of antithrombotics/antiplatelets: Secondary | ICD-10-CM | POA: Insufficient documentation

## 2021-09-01 LAB — COMPREHENSIVE METABOLIC PANEL
ALT: 12 U/L (ref 0–44)
AST: 17 U/L (ref 15–41)
Albumin: 3.7 g/dL (ref 3.5–5.0)
Alkaline Phosphatase: 68 U/L (ref 38–126)
Anion gap: 11 (ref 5–15)
BUN: 8 mg/dL (ref 8–23)
CO2: 22 mmol/L (ref 22–32)
Calcium: 9.1 mg/dL (ref 8.9–10.3)
Chloride: 101 mmol/L (ref 98–111)
Creatinine, Ser: 0.83 mg/dL (ref 0.61–1.24)
GFR, Estimated: >60 mL/min (ref >60)
Glucose, Bld: 124 mg/dL — ABNORMAL HIGH (ref 70–99)
Potassium: 3.4 mmol/L — ABNORMAL LOW (ref 3.5–5.1)
Sodium: 134 mmol/L — ABNORMAL LOW (ref 135–145)
Total Bilirubin: 1.1 mg/dL (ref 0.3–1.2)
Total Protein: 7.8 g/dL (ref 6.5–8.1)

## 2021-09-01 LAB — CBC
HCT: 46 % (ref 39.0–52.0)
Hemoglobin: 16 g/dL (ref 13.0–17.0)
MCH: 31.5 pg (ref 26.0–34.0)
MCHC: 34.8 g/dL (ref 30.0–36.0)
MCV: 90.6 fL (ref 80.0–100.0)
Platelets: 363 10*3/uL (ref 150–400)
RBC: 5.08 MIL/uL (ref 4.22–5.81)
RDW: 12.9 % (ref 11.5–15.5)
WBC: 11.4 10*3/uL — ABNORMAL HIGH (ref 4.0–10.5)
nRBC: 0 % (ref 0.0–0.2)

## 2021-09-01 LAB — RESP PANEL BY RT-PCR (FLU A&B, COVID) ARPGX2
Influenza A by PCR: NEGATIVE
Influenza B by PCR: NEGATIVE
SARS Coronavirus 2 by RT PCR: NEGATIVE

## 2021-09-01 LAB — LIPASE, BLOOD: Lipase: 24 U/L (ref 11–51)

## 2021-09-01 MED ORDER — ONDANSETRON 4 MG PO TBDP: 4.0000 mg | ORAL_TABLET | Freq: Three times a day (TID) | ORAL | 0 refills | Status: DC | PRN

## 2021-09-01 NOTE — ED Triage Notes (Signed)
Pt comes into the ED via [facility] from home c/o N/V for a couple days.  Pt also has a cough present that is productive.  Pt denies any fevers.  Pt ambulatory to triage with even and unlabored respirations.

## 2021-09-01 NOTE — ED Notes (Signed)
Md at bedside, pt in hallway bed  pt alert

## 2021-09-01 NOTE — ED Triage Notes (Signed)
Pt to ED via [facility] with c/o N/V for that last couple of days. He has a productive cough with yellow phlem. VS. Pt is afebrile.

## 2021-09-01 NOTE — ED Provider Notes (Signed)
[facility] Emergency Department Provider Note  Time seen: 3:24 PM  I have reviewed the triage vital signs and the nursing notes.   HISTORY  Chief Complaint Emesis   HPI Brendan Williams is a 60 y.o. male with a past medical history of CAD, presents to the emergency department for nausea vomiting diarrhea.  According to the patient for the past 2 weeks or so he has been experiencing intermittent nausea with occasional episodes of vomiting and diarrhea.  States he is also had a cough and feeling congested and somewhat fatigued.  Patient was concerned he could have COVID or the flu and did not want to get his family sick so he came to the emergency department for evaluation.  Patient denies any chest pain or abdominal pain.  Reassuring vitals besides mild hypertension.  Afebrile.  Although states subjective chills at home at times. Past Medical History:  Diagnosis Date   Peripheral arterial disease [facility]     Patient Active Problem List   Diagnosis Date Noted   Atherosclerosis of native arteries of extremity with rest pain (HCC) 11/25/2019   PAD (peripheral artery disease) (HCC) 11/25/2019   Tobacco use disorder 11/11/2019   Atherosclerosis of native arteries of extremity with intermittent claudication (HCC) 11/11/2019    Past Surgical History:  Procedure Laterality Date   AMPUTATION TOE Right 12/02/2019   Procedure: AMPUTATION RIGHT 5TH TOE;  Surgeon: Rosetta Posner, DPM;  Location: [facility];  Service: Podiatry;  Laterality: Right;   APPLICATION OF WOUND VAC Bilateral 11/28/2019   Procedure: APPLICATION OF WOUND VAC;  Surgeon: Annice Needy, MD;  Location: [facility];  Service: Vascular;  Laterality: Bilateral;  Prevena    BACK SURGERY     lower ruptured disk   CENTRAL VENOUS CATHETER INSERTION  11/28/2019   Procedure: INSERTION CENTRAL LINE ADULT;  Surgeon: Annice Needy, MD;  Location: [facility];  Service: Vascular;;   ENDARTERECTOMY FEMORAL Bilateral 11/28/2019    Procedure: ENDARTERECTOMY FEMORAL;  Surgeon: Annice Needy, MD;  Location: [facility];  Service: Vascular;  Laterality: Bilateral;   LOWER EXTREMITY ANGIOGRAPHY Right 11/14/2019   Procedure: LOWER EXTREMITY ANGIOGRAPHY;  Surgeon: Annice Needy, MD;  Location: [facility];  Service: Cardiovascular;  Laterality: Right;   STOMACH SURGERY     TONSILECTOMY, ADENOIDECTOMY, BILATERAL MYRINGOTOMY AND TUBES      Prior to Admission medications   Medication Sig Start Date End Date Taking? Authorizing Provider  aspirin EC 81 MG tablet Take 1 tablet (81 mg total) by mouth daily. 11/14/19   Annice Needy, MD  atorvastatin (LIPITOR) 10 MG tablet Take 1 tablet (10 mg total) by mouth daily. 11/14/19 11/13/20  Annice Needy, MD  clindamycin (CLEOCIN) 300 MG capsule Take 1 capsule (300 mg total) by mouth 3 (three) times daily. Patient not taking: Reported on 12/29/2019 12/05/19   Rosetta Posner, DPM  clopidogrel (PLAVIX) 75 MG tablet Take 1 tablet (75 mg total) by mouth daily. 11/14/19   Annice Needy, MD  gabapentin (NEURONTIN) 300 MG capsule Take 1 capsule (300 mg total) by mouth 2 (two) times daily. 08/11/20   Georgiana Spinner, NP    No Known Allergies  Family History  Problem Relation Age of Onset   Leukemia Mother    Heart disease Father        a. CABG   Heart disease Sister        a. MVR   Cancer Sister    Cancer Brother  CAD Brother        a. CABG    Social History Social History   Tobacco Use   Smoking status: Every Day    Packs/day: 1.00    Types: Cigarettes   Smokeless tobacco: Never  Vaping Use   Vaping Use: Never used  Substance Use Topics   Alcohol use: Yes    Alcohol/week: 5.0 standard drinks    Types: 5 Cans of beer per week    Comment: 4-5 beers a week   Drug use: Yes    Frequency: 3.0 times per week    Types: Marijuana    Comment: 3-4 times a week    Review of Systems Constitutional: Chills, no fever. Respiratory: Negative for shortness of breath.  Positive for cough  and congestion. Gastrointestinal: Negative for abdominal pain.  Positive for intermittent nausea vomiting diarrhea. Genitourinary: Negative for urinary compaints Musculoskeletal: Negative for musculoskeletal complaints Neurological: Negative for headache All other ROS negative  ____________________________________________   PHYSICAL EXAM:  VITAL SIGNS: ED Triage Vitals  Enc Vitals Group     BP 09/01/21 1255 (!) 167/107     Pulse Rate 09/01/21 1255 61     Resp 09/01/21 1255 18     Temp 09/01/21 1255 97.9 F (36.6 C)     Temp Source 09/01/21 1255 Oral     SpO2 09/01/21 1255 99 %     Weight 09/01/21 1253 169 lb 1.5 oz (76.7 kg)     Height 09/01/21 1253 6\' 1"  (1.854 m)     Head Circumference --      Peak Flow --      Pain Score 09/01/21 1253 6     Pain Loc --      Pain Edu? --      Excl. in GC? --    Constitutional: Alert and oriented. Well appearing and in no distress. Eyes: Normal exam ENT      Head: Normocephalic and atraumatic.      Mouth/Throat: Mucous membranes are moist. Cardiovascular: Normal rate, regular rhythm.  Respiratory: Normal respiratory effort without tachypnea nor retractions. Breath sounds are clear Gastrointestinal: Soft and nontender. No distention.  Musculoskeletal: Nontender with normal range of motion in all extremities. Neurologic:  Normal speech and language. No gross focal neurologic deficits Skin:  Skin is warm, dry and intact.  Psychiatric: Mood and affect are normal.    INITIAL IMPRESSION / ASSESSMENT AND PLAN / ED COURSE  Pertinent labs & imaging results that were available during my care of the patient were reviewed by me and considered in my medical decision making (see chart for details).   Patient presents emergency department for cough congestion intermittent nausea vomiting diarrhea over the past 1 to 2 weeks.  Overall the patient appears well, reassuring vitals, reassuring physical exam, benign abdomen.  Denies any chest pain at any  point.  Patient's labs are reassuring, no concerning findings.  I did discuss with the patient increasing oral hydration at home, we will prescribe Zofran.  We will perform a COVID/flu test, patient states he will follow-up on MyChart for results.  Brendan Williams was evaluated in Emergency Department on 09/01/2021 for the symptoms described in the history of present illness. He was evaluated in the context of the global COVID-19 pandemic, which necessitated consideration that the patient might be at risk for infection with the SARS-CoV-2 virus that causes COVID-19. Institutional protocols and algorithms that pertain to the evaluation of patients at risk for COVID-19 are in a state  of rapid change based on information released by regulatory bodies including the CDC and federal and state organizations. These policies and algorithms were followed during the patient's care in the ED.  ____________________________________________   FINAL CLINICAL IMPRESSION(S) / ED DIAGNOSES  Viral illness   Minna Antis, MD 09/01/21 1528

## 2021-12-14 ENCOUNTER — Encounter (INDEPENDENT_AMBULATORY_CARE_PROVIDER_SITE_OTHER): Payer: Self-pay | Admitting: Nurse Practitioner

## 2021-12-14 ENCOUNTER — Other Ambulatory Visit: Payer: Self-pay

## 2021-12-14 ENCOUNTER — Ambulatory Visit (INDEPENDENT_AMBULATORY_CARE_PROVIDER_SITE_OTHER): Payer: Self-pay | Admitting: Nurse Practitioner

## 2021-12-14 ENCOUNTER — Telehealth (INDEPENDENT_AMBULATORY_CARE_PROVIDER_SITE_OTHER): Payer: Self-pay

## 2021-12-14 ENCOUNTER — Ambulatory Visit (INDEPENDENT_AMBULATORY_CARE_PROVIDER_SITE_OTHER): Payer: Self-pay

## 2021-12-14 VITALS — BP 102/68 | HR 55 | Resp 16 | Ht 73.0 in | Wt 175.4 lb

## 2021-12-14 DIAGNOSIS — I70221 Atherosclerosis of native arteries of extremities with rest pain, right leg: Secondary | ICD-10-CM

## 2021-12-14 DIAGNOSIS — F17200 Nicotine dependence, unspecified, uncomplicated: Secondary | ICD-10-CM

## 2021-12-14 NOTE — Telephone Encounter (Signed)
Spoke with the patient and he is scheduled with Dr. Lucky Cowboy for a RLE angio on 12/19/21 with a 10:00 am arrival time to the [facility]. Pre-procedure instructions were discussed and will be mailed. ?

## 2021-12-18 ENCOUNTER — Encounter (INDEPENDENT_AMBULATORY_CARE_PROVIDER_SITE_OTHER): Payer: Self-pay | Admitting: Nurse Practitioner

## 2021-12-18 ENCOUNTER — Other Ambulatory Visit (INDEPENDENT_AMBULATORY_CARE_PROVIDER_SITE_OTHER): Payer: Self-pay | Admitting: Nurse Practitioner

## 2021-12-18 DIAGNOSIS — I70221 Atherosclerosis of native arteries of extremities with rest pain, right leg: Secondary | ICD-10-CM

## 2021-12-18 NOTE — H&P (View-Only) (Signed)
? ?Subjective:  ? ? Patient ID: Brendan Williams, male    DOB: [birth year], 61 y.o.   MRN: [id number] ?Chief Complaint  ?Patient presents with  ? Follow-up  ?  Ultrasound follow up  ? ? ?The patient returns to the office for followup and review of the noninvasive studies. There has been a significant deterioration in the lower extremity symptoms.  The patient notes interval shortening of their claudication distance and development of mild rest pain symptoms. No new ulcers or wounds have occurred since the last visit.  There is also discoloration of the right lower extremity as well as a cold sensation. ? ?There have been no significant changes to the patient's overall health care. ? ?The patient denies amaurosis fugax or recent TIA symptoms. There are no recent neurological changes noted. ?The patient denies history of DVT, PE or superficial thrombophlebitis. ?The patient denies recent episodes of angina or shortness of breath.  ? ?ABI's Rt=0.72 and Lt=0.83 (previous ABI's Rt=1.13 and Lt=1.21) ?Duplex US of the lower extremity arterial system shows monophasic tibial artery waveforms bilaterally.  The right lower extremity has nearly flat toe waveforms with slightly dampened toe waveforms in the left. ? ? ?Review of Systems  ?Cardiovascular:   ?     Rest pain with claudication  ?Skin:  Positive for color change.  ?All other systems reviewed and are negative. ? ?   ?Objective:  ? Physical Exam ?Vitals reviewed.  ?HENT:  ?   Head: Normocephalic.  ?Cardiovascular:  ?   Rate and Rhythm: Normal rate.  ?Pulmonary:  ?   Effort: Pulmonary effort is normal.  ?Skin: ?   General: Skin is cool.  ?   Coloration: Skin is pale.  ?Neurological:  ?   Mental Status: He is alert and oriented to person, place, and time.  ?Psychiatric:     ?   Mood and Affect: Mood normal.     ?   Behavior: Behavior normal.     ?   Thought Content: Thought content normal.     ?   Judgment: Judgment normal.  ? ? ?BP 102/68 (BP Location: Right Arm)   Pulse  (!) 55   Resp 16   Ht 6\' 1"  (1.854 m)   Wt 175 lb 6.4 oz (79.6 kg)   BMI 23.14 kg/m?  ? ?Past Medical History:  ?Diagnosis Date  ? Peripheral arterial disease ([place])   ? ? ?Social History  ? ?Socioeconomic History  ? Marital status: Married  ?  Spouse name: Not on file  ? Number of children: Not on file  ? Years of education: Not on file  ? Highest education level: Not on file  ?Occupational History  ? Not on file  ?Tobacco Use  ? Smoking status: Every Day  ?  Packs/day: 1.00  ?  Types: Cigarettes  ? Smokeless tobacco: Never  ?Vaping Use  ? Vaping Use: Never used  ?Substance and Sexual Activity  ? Alcohol use: Yes  ?  Alcohol/week: 5.0 standard drinks  ?  Types: 5 Cans of beer per week  ?  Comment: 4-5 beers a week  ? Drug use: Yes  ?  Frequency: 3.0 times per week  ?  Types: Marijuana  ?  Comment: 3-4 times a week  ? Sexual activity: Yes  ?Other Topics Concern  ? Not on file  ?Social History Narrative  ? Live in private residence with wife  ? ?Social Determinants of Health  ? ?Financial Resource Strain: Not on  file  ?Food Insecurity: Not on file  ?Transportation Needs: Not on file  ?Physical Activity: Not on file  ?Stress: Not on file  ?Social Connections: Not on file  ?Intimate Partner Violence: Not on file  ? ? ?Past Surgical History:  ?Procedure Laterality Date  ? AMPUTATION TOE Right 12/02/2019  ? Procedure: AMPUTATION RIGHT 5TH TOE;  Surgeon: Caroline More, DPM;  Location: [facility];  Service: Podiatry;  Laterality: Right;  ? APPLICATION OF WOUND VAC Bilateral 11/28/2019  ? Procedure: APPLICATION OF WOUND VAC;  Surgeon: Algernon Huxley, MD;  Location: [facility];  Service: Vascular;  Laterality: Bilateral;  Prevena   ? BACK SURGERY    ? lower ruptured disk  ? CENTRAL VENOUS CATHETER INSERTION  11/28/2019  ? Procedure: INSERTION CENTRAL LINE ADULT;  Surgeon: Algernon Huxley, MD;  Location: [facility];  Service: Vascular;;  ? ENDARTERECTOMY FEMORAL Bilateral 11/28/2019  ? Procedure: ENDARTERECTOMY FEMORAL;  Surgeon: Algernon Huxley, MD;  Location: [facility];  Service: Vascular;  Laterality: Bilateral;  ? LOWER EXTREMITY ANGIOGRAPHY Right 11/14/2019  ? Procedure: LOWER EXTREMITY ANGIOGRAPHY;  Surgeon: Algernon Huxley, MD;  Location: [facility];  Service: Cardiovascular;  Laterality: Right;  ? STOMACH SURGERY    ? TONSILECTOMY, ADENOIDECTOMY, BILATERAL MYRINGOTOMY AND TUBES    ? ? ?Family History  ?Problem Relation Age of Onset  ? Leukemia Mother   ? Heart disease Father   ?     a. CABG  ? Heart disease Sister   ?     a. MVR  ? Cancer Sister   ? Cancer Brother   ? CAD Brother   ?     a. CABG  ? ? ?No Known Allergies ? ?CBC Latest Ref Rng & Units 09/01/2021 12/04/2019 12/03/2019  ?WBC 4.0 - 10.5 K/uL 11.4(H) 9.7 10.9(H)  ?Hemoglobin 13.0 - 17.0 g/dL 16.0 8.2(L) 7.6(L)  ?Hematocrit 39.0 - 52.0 % 46.0 24.9(L) 23.3(L)  ?Platelets 150 - 400 K/uL 363 476(H) 384  ? ? ? ? ?CMP  ?   ?Component Value Date/Time  ? NA 134 (L) 09/01/2021 1255  ? K 3.4 (L) 09/01/2021 1255  ? CL 101 09/01/2021 1255  ? CO2 22 09/01/2021 1255  ? GLUCOSE 124 (H) 09/01/2021 1255  ? BUN 8 09/01/2021 1255  ? CREATININE 0.83 09/01/2021 1255  ? CALCIUM 9.1 09/01/2021 1255  ? PROT 7.8 09/01/2021 1255  ? ALBUMIN 3.7 09/01/2021 1255  ? AST 17 09/01/2021 1255  ? ALT 12 09/01/2021 1255  ? ALKPHOS 68 09/01/2021 1255  ? BILITOT 1.1 09/01/2021 1255  ? GFRNONAA >60 09/01/2021 1255  ? GFRAA >60 12/04/2019 0523  ? ? ? ?No results found. ? ?   ?Assessment & Plan:  ? ?1. Atherosclerosis of native artery of right lower extremity with rest pain ([place]) ?Recommend: ? ?The patient has evidence of severe atherosclerotic changes of both lower extremities with rest pain that is associated with preulcerative changes and impending tissue loss of the foot.  This represents a limb threatening ischemia and places the patient at the risk for limb loss. ? ?Patient should undergo angiography of the lower extremities with the hope for intervention for limb salvage.  The risks and benefits as well as the  alternative therapies was discussed in detail with the patient.  All questions were answered.  Patient agrees to proceed with angiography. ? ?The patient will follow up with me in the office after the procedure.  ? ?   ? ?2. Tobacco  use disorder ?Smoking cessation was discussed, 3-10 minutes spent on this topic specifically  ? ? ?Current Outpatient Medications on File Prior to Visit  ?Medication Sig Dispense Refill  ? aspirin EC 81 MG tablet Take 1 tablet (81 mg total) by mouth daily. 150 tablet 2  ? clindamycin (CLEOCIN) 300 MG capsule Take 1 capsule (300 mg total) by mouth 3 (three) times daily. 21 capsule 0  ? clopidogrel (PLAVIX) 75 MG tablet Take 1 tablet (75 mg total) by mouth daily. 30 tablet 11  ? gabapentin (NEURONTIN) 300 MG capsule Take 1 capsule (300 mg total) by mouth 2 (two) times daily. 60 capsule 6  ? ondansetron (ZOFRAN ODT) 4 MG disintegrating tablet Take 1 tablet (4 mg total) by mouth every 8 (eight) hours as needed for nausea or vomiting. 20 tablet 0  ? atorvastatin (LIPITOR) 10 MG tablet Take 1 tablet (10 mg total) by mouth daily. 30 tablet 11  ? ?No current facility-administered medications on file prior to visit.  ? ? ?There are no Patient Instructions on file for this visit. ?No follow-ups on file. ? ? ?Kris Hartmann, NP ? ? ?

## 2021-12-18 NOTE — Progress Notes (Signed)
? ?Subjective:  ? ? Patient ID: Brendan Williams, male    DOB: 03/25/1961, 60 y.o.   MRN: [id number] ?Chief Complaint  ?Patient presents with  ? Follow-up  ?  Ultrasound follow up  ? ? ?The patient returns to the office for followup and review of the noninvasive studies. There has been a significant deterioration in the lower extremity symptoms.  The patient notes interval shortening of their claudication distance and development of mild rest pain symptoms. No new ulcers or wounds have occurred since the last visit.  There is also discoloration of the right lower extremity as well as a cold sensation. ? ?There have been no significant changes to the patient's overall health care. ? ?The patient denies amaurosis fugax or recent TIA symptoms. There are no recent neurological changes noted. ?The patient denies history of DVT, PE or superficial thrombophlebitis. ?The patient denies recent episodes of angina or shortness of breath.  ? ?ABI's Rt=0.72 and Lt=0.83 (previous ABI's Rt=1.13 and Lt=1.21) ?Duplex US of the lower extremity arterial system shows monophasic tibial artery waveforms bilaterally.  The right lower extremity has nearly flat toe waveforms with slightly dampened toe waveforms in the left. ? ? ?Review of Systems  ?Cardiovascular:   ?     Rest pain with claudication  ?Skin:  Positive for color change.  ?All other systems reviewed and are negative. ? ?   ?Objective:  ? Physical Exam ?Vitals reviewed.  ?HENT:  ?   Head: Normocephalic.  ?Cardiovascular:  ?   Rate and Rhythm: Normal rate.  ?Pulmonary:  ?   Effort: Pulmonary effort is normal.  ?Skin: ?   General: Skin is cool.  ?   Coloration: Skin is pale.  ?Neurological:  ?   Mental Status: He is alert and oriented to person, place, and time.  ?Psychiatric:     ?   Mood and Affect: Mood normal.     ?   Behavior: Behavior normal.     ?   Thought Content: Thought content normal.     ?   Judgment: Judgment normal.  ? ? ?BP 102/68 (BP Location: Right Arm)   Pulse  (!) 55   Resp 16   Ht 6' 1" (1.854 m)   Wt 175 lb 6.4 oz (79.6 kg)   BMI 23.14 kg/m?  ? ?Past Medical History:  ?Diagnosis Date  ? Peripheral arterial disease (HCC)   ? ? ?Social History  ? ?Socioeconomic History  ? Marital status: Married  ?  Spouse name: Not on file  ? Number of children: Not on file  ? Years of education: Not on file  ? Highest education level: Not on file  ?Occupational History  ? Not on file  ?Tobacco Use  ? Smoking status: Every Day  ?  Packs/day: 1.00  ?  Types: Cigarettes  ? Smokeless tobacco: Never  ?Vaping Use  ? Vaping Use: Never used  ?Substance and Sexual Activity  ? Alcohol use: Yes  ?  Alcohol/week: 5.0 standard drinks  ?  Types: 5 Cans of beer per week  ?  Comment: 4-5 beers a week  ? Drug use: Yes  ?  Frequency: 3.0 times per week  ?  Types: Marijuana  ?  Comment: 3-4 times a week  ? Sexual activity: Yes  ?Other Topics Concern  ? Not on file  ?Social History Narrative  ? Live in private residence with wife  ? ?Social Determinants of Health  ? ?Financial Resource Strain: Not on   file  ?Food Insecurity: Not on file  ?Transportation Needs: Not on file  ?Physical Activity: Not on file  ?Stress: Not on file  ?Social Connections: Not on file  ?Intimate Partner Violence: Not on file  ? ? ?Past Surgical History:  ?Procedure Laterality Date  ? AMPUTATION TOE Right 12/02/2019  ? Procedure: AMPUTATION RIGHT 5TH TOE;  Surgeon: Baker, Andrew, DPM;  Location: [facility];  Service: Podiatry;  Laterality: Right;  ? APPLICATION OF WOUND VAC Bilateral 11/28/2019  ? Procedure: APPLICATION OF WOUND VAC;  Surgeon: Dew, Jason S, MD;  Location: [facility];  Service: Vascular;  Laterality: Bilateral;  Prevena   ? BACK SURGERY    ? lower ruptured disk  ? CENTRAL VENOUS CATHETER INSERTION  11/28/2019  ? Procedure: INSERTION CENTRAL LINE ADULT;  Surgeon: Dew, Jason S, MD;  Location: [facility];  Service: Vascular;;  ? ENDARTERECTOMY FEMORAL Bilateral 11/28/2019  ? Procedure: ENDARTERECTOMY FEMORAL;  Surgeon: Dew,  Jason S, MD;  Location: [facility];  Service: Vascular;  Laterality: Bilateral;  ? LOWER EXTREMITY ANGIOGRAPHY Right 11/14/2019  ? Procedure: LOWER EXTREMITY ANGIOGRAPHY;  Surgeon: Dew, Jason S, MD;  Location: [facility];  Service: Cardiovascular;  Laterality: Right;  ? STOMACH SURGERY    ? TONSILECTOMY, ADENOIDECTOMY, BILATERAL MYRINGOTOMY AND TUBES    ? ? ?Family History  ?Problem Relation Age of Onset  ? Leukemia Mother   ? Heart disease Father   ?     a. CABG  ? Heart disease Sister   ?     a. MVR  ? Cancer Sister   ? Cancer Brother   ? CAD Brother   ?     a. CABG  ? ? ?No Known Allergies ? ?CBC Latest Ref Rng & Units 09/01/2021 12/04/2019 12/03/2019  ?WBC 4.0 - 10.5 K/uL 11.4(H) 9.7 10.9(H)  ?Hemoglobin 13.0 - 17.0 g/dL 16.0 8.2(L) 7.6(L)  ?Hematocrit 39.0 - 52.0 % 46.0 24.9(L) 23.3(L)  ?Platelets 150 - 400 K/uL 363 476(H) 384  ? ? ? ? ?CMP  ?   ?Component Value Date/Time  ? NA 134 (L) 09/01/2021 1255  ? K 3.4 (L) 09/01/2021 1255  ? CL 101 09/01/2021 1255  ? CO2 22 09/01/2021 1255  ? GLUCOSE 124 (H) 09/01/2021 1255  ? BUN 8 09/01/2021 1255  ? CREATININE 0.83 09/01/2021 1255  ? CALCIUM 9.1 09/01/2021 1255  ? PROT 7.8 09/01/2021 1255  ? ALBUMIN 3.7 09/01/2021 1255  ? AST 17 09/01/2021 1255  ? ALT 12 09/01/2021 1255  ? ALKPHOS 68 09/01/2021 1255  ? BILITOT 1.1 09/01/2021 1255  ? GFRNONAA >60 09/01/2021 1255  ? GFRAA >60 12/04/2019 0523  ? ? ? ?No results found. ? ?   ?Assessment & Plan:  ? ?1. Atherosclerosis of native artery of right lower extremity with rest pain (HCC) ?Recommend: ? ?The patient has evidence of severe atherosclerotic changes of both lower extremities with rest pain that is associated with preulcerative changes and impending tissue loss of the foot.  This represents a limb threatening ischemia and places the patient at the risk for limb loss. ? ?Patient should undergo angiography of the lower extremities with the hope for intervention for limb salvage.  The risks and benefits as well as the  alternative therapies was discussed in detail with the patient.  All questions were answered.  Patient agrees to proceed with angiography. ? ?The patient will follow up with me in the office after the procedure.  ? ?   ? ?2. Tobacco   use disorder ?Smoking cessation was discussed, 3-10 minutes spent on this topic specifically  ? ? ?Current Outpatient Medications on File Prior to Visit  ?Medication Sig Dispense Refill  ? aspirin EC 81 MG tablet Take 1 tablet (81 mg total) by mouth daily. 150 tablet 2  ? clindamycin (CLEOCIN) 300 MG capsule Take 1 capsule (300 mg total) by mouth 3 (three) times daily. 21 capsule 0  ? clopidogrel (PLAVIX) 75 MG tablet Take 1 tablet (75 mg total) by mouth daily. 30 tablet 11  ? gabapentin (NEURONTIN) 300 MG capsule Take 1 capsule (300 mg total) by mouth 2 (two) times daily. 60 capsule 6  ? ondansetron (ZOFRAN ODT) 4 MG disintegrating tablet Take 1 tablet (4 mg total) by mouth every 8 (eight) hours as needed for nausea or vomiting. 20 tablet 0  ? atorvastatin (LIPITOR) 10 MG tablet Take 1 tablet (10 mg total) by mouth daily. 30 tablet 11  ? ?No current facility-administered medications on file prior to visit.  ? ? ?There are no Patient Instructions on file for this visit. ?No follow-ups on file. ? ? ?Dainelle Hun E Travion Ke, NP ? ? ?

## 2021-12-19 ENCOUNTER — Ambulatory Visit
Admission: RE | Admit: 2021-12-19 | Discharge: 2021-12-19 | Disposition: A | Discharge status: Home / Self Care | Payer: Medicaid Other | Attending: Vascular Surgery | Admitting: Vascular Surgery

## 2021-12-19 ENCOUNTER — Encounter
Admission: RE | Disposition: A | Discharge status: Home / Self Care | Payer: Self-pay | Source: Home / Self Care | Attending: Vascular Surgery

## 2021-12-19 ENCOUNTER — Other Ambulatory Visit: Payer: Self-pay

## 2021-12-19 ENCOUNTER — Encounter: Payer: Self-pay | Admitting: Vascular Surgery

## 2021-12-19 DIAGNOSIS — Z79899 Other long term (current) drug therapy: Secondary | ICD-10-CM | POA: Insufficient documentation

## 2021-12-19 DIAGNOSIS — Z7902 Long term (current) use of antithrombotics/antiplatelets: Secondary | ICD-10-CM | POA: Insufficient documentation

## 2021-12-19 DIAGNOSIS — Z7982 Long term (current) use of aspirin: Secondary | ICD-10-CM | POA: Insufficient documentation

## 2021-12-19 DIAGNOSIS — I70229 Atherosclerosis of native arteries of extremities with rest pain, unspecified extremity: Secondary | ICD-10-CM

## 2021-12-19 DIAGNOSIS — I70221 Atherosclerosis of native arteries of extremities with rest pain, right leg: Secondary | ICD-10-CM | POA: Insufficient documentation

## 2021-12-19 DIAGNOSIS — F17210 Nicotine dependence, cigarettes, uncomplicated: Secondary | ICD-10-CM | POA: Insufficient documentation

## 2021-12-19 HISTORY — PX: LOWER EXTREMITY ANGIOGRAPHY: CATH118251

## 2021-12-19 LAB — CREATININE, SERUM
Creatinine, Ser: 0.98 mg/dL (ref 0.61–1.24)
GFR, Estimated: >60 mL/min (ref >60)

## 2021-12-19 LAB — BUN: BUN: 10 mg/dL (ref 8–23)

## 2021-12-19 SURGERY — LOWER EXTREMITY ANGIOGRAPHY
Anesthesia: Moderate Sedation | Site: Leg Lower | Laterality: Right

## 2021-12-19 MED ORDER — FENTANYL CITRATE (PF) 100 MCG/2ML IJ SOLN
INTRAMUSCULAR | Status: DC | PRN
  Administered 2021-12-19 (×2): 25 ug via INTRAVENOUS
  Administered 2021-12-19: 50 ug via INTRAVENOUS

## 2021-12-19 MED ORDER — MIDAZOLAM HCL 2 MG/2ML IJ SOLN
INTRAMUSCULAR | Status: AC
  Filled 2021-12-19: qty 2

## 2021-12-19 MED ORDER — SODIUM CHLORIDE 0.9 % IV SOLN: INTRAVENOUS | Status: DC

## 2021-12-19 MED ORDER — CEFAZOLIN SODIUM-DEXTROSE 2-4 GM/100ML-% IV SOLN
2.0000 g | Freq: Once | INTRAVENOUS | Status: AC
  Administered 2021-12-19: 2 g via INTRAVENOUS

## 2021-12-19 MED ORDER — HEPARIN SODIUM (PORCINE) 1000 UNIT/ML IJ SOLN
INTRAMUSCULAR | Status: DC | PRN
  Administered 2021-12-19: 3000 [IU] via INTRAVENOUS

## 2021-12-19 MED ORDER — FENTANYL CITRATE PF 50 MCG/ML IJ SOSY
PREFILLED_SYRINGE | INTRAMUSCULAR | Status: AC
  Filled 2021-12-19: qty 1

## 2021-12-19 MED ORDER — HEPARIN SODIUM (PORCINE) 1000 UNIT/ML IJ SOLN
INTRAMUSCULAR | Status: AC
  Filled 2021-12-19: qty 10

## 2021-12-19 MED ORDER — MIDAZOLAM HCL 2 MG/2ML IJ SOLN
INTRAMUSCULAR | Status: DC | PRN
  Administered 2021-12-19 (×2): 1 mg via INTRAVENOUS
  Administered 2021-12-19: 2 mg via INTRAVENOUS

## 2021-12-19 MED ORDER — HYDROMORPHONE HCL 1 MG/ML IJ SOLN
INTRAMUSCULAR | Status: AC
  Administered 2021-12-19: 1 mg via INTRAVENOUS
  Filled 2021-12-19: qty 1

## 2021-12-19 MED ORDER — METHYLPREDNISOLONE SODIUM SUCC 125 MG IJ SOLR: 125.0000 mg | Freq: Once | INTRAMUSCULAR | Status: DC | PRN

## 2021-12-19 MED ORDER — ONDANSETRON HCL 4 MG/2ML IJ SOLN: 4.0000 mg | Freq: Four times a day (QID) | INTRAMUSCULAR | Status: DC | PRN

## 2021-12-19 MED ORDER — DIPHENHYDRAMINE HCL 50 MG/ML IJ SOLN: 50.0000 mg | Freq: Once | INTRAMUSCULAR | Status: DC | PRN

## 2021-12-19 MED ORDER — MIDAZOLAM HCL 2 MG/ML PO SYRP: 8.0000 mg | ORAL_SOLUTION | Freq: Once | ORAL | Status: DC | PRN

## 2021-12-19 MED ORDER — FAMOTIDINE 20 MG PO TABS: 40.0000 mg | ORAL_TABLET | Freq: Once | ORAL | Status: DC | PRN

## 2021-12-19 MED ORDER — HYDROMORPHONE HCL 1 MG/ML IJ SOLN: 1.0000 mg | Freq: Once | INTRAMUSCULAR | Status: AC | PRN

## 2021-12-19 SURGICAL SUPPLY — 17 items
CANNULA 5F STIFF (CANNULA) ×1 IMPLANT
CATH ANGIO 5F PIGTAIL 65CM (CATHETERS) ×1 IMPLANT
CATH TEMPO 5F RIM 65CM (CATHETERS) ×1 IMPLANT
CATH VS15FR (CATHETERS) ×1 IMPLANT
COVER PROBE U/S 5X48 (MISCELLANEOUS) ×1 IMPLANT
DEVICE STARCLOSE SE CLOSURE (Vascular Products) ×1 IMPLANT
DEVICE TORQUE (MISCELLANEOUS) ×1 IMPLANT
GLIDECATH 4FR STR (CATHETERS) ×1 IMPLANT
GLIDEWIRE ADV .035X260CM (WIRE) ×1 IMPLANT
GLIDEWIRE ANGLED SS 035X260CM (WIRE) ×1 IMPLANT
GUIDEWIRE ANGLED .035X260CM (WIRE) ×1 IMPLANT
GUIDEWIRE SUPER STIFF .035X180 (WIRE) ×1 IMPLANT
PACK ANGIOGRAPHY (CUSTOM PROCEDURE TRAY) ×2 IMPLANT
SHEATH BRITE TIP 5FRX11 (SHEATH) ×1 IMPLANT
SYR MEDRAD MARK 7 150ML (SYRINGE) ×1 IMPLANT
TUBING CONTRAST HIGH PRESS 72 (TUBING) ×1 IMPLANT
WIRE GUIDERIGHT .035X150 (WIRE) ×1 IMPLANT

## 2021-12-19 NOTE — Interval H&P Note (Signed)
History and Physical Interval Note: ? ?12/19/2021 ?10:18 AM ? ?Brendan Williams  has presented today for surgery, with the diagnosis of RLE Angio   BARD   ASO w rest.  The various methods of treatment have been discussed with the patient and family. After consideration of risks, benefits and other options for treatment, the patient has consented to  Procedure(s): ?Lower Extremity Angiography (Right) as a surgical intervention.  The patient's history has been reviewed, patient examined, no change in status, stable for surgery.  I have reviewed the patient's chart and labs.  Questions were answered to the patient's satisfaction.   ? ? ?Festus Barren ? ? ?

## 2021-12-19 NOTE — Op Note (Signed)
[facility] ? Percutaneous Study/Intervention Procedural Note ? ? ?Date of Surgery: 12/19/2021 ? ?Surgeon(s):Deshaun Weisinger   ? ?Assistants:none ? ?Pre-operative Diagnosis: PAD with rest pain right lower extremity ? ?Post-operative diagnosis:  Same ? ?Procedure(s) Performed: ?            1.  Ultrasound guidance for vascular access left femoral artery ?            2.  Catheter placement into right common femoral artery from left femoral ?            3.  Aortogram and selective right lower extremity angiogram ?            4.  StarClose closure device left femoral artery ? ?EBL: 5 cc ? ?Contrast: 45 cc ? ?Fluoro Time: 11.3 minutes ? ?Moderate Conscious Sedation Time: approximately 40 minutes using 4 mg of Versed and 100 mcg of Fentanyl ?             ?Indications:  Patient is a 61 y.o.male with severe peripheral arterial disease status post extensive aortoiliac intervention previously as well as femoral endarterectomies and bilateral SFA stents. The patient has noninvasive study showing a significant reduction in the ABIs worse on the right than the left now with symptoms concerning for rest pain on the right and claudication on both sides. The patient is brought in for angiography for further evaluation and potential treatment.  Due to the limb threatening nature of the situation, angiogram was performed for attempted limb salvage. The patient is aware that if the procedure fails, amputation would be expected.  The patient also understands that even with successful revascularization, amputation may still be required due to the severity of the situation.  Risks and benefits are discussed and informed consent is obtained.  ? ?Procedure:  The patient was identified and appropriate procedural time out was performed.  The patient was then placed supine on the table and prepped and draped in the usual sterile fashion. Moderate conscious sedation was administered during a face to face encounter with the  patient throughout the procedure with my supervision of the RN administering medicines and monitoring the patient's vital signs, pulse oximetry, telemetry and mental status throughout from the start of the procedure until the patient was taken to the recovery room. Ultrasound was used to evaluate the left common femoral artery.  It was patent .  A digital ultrasound image was acquired.  A Seldinger needle was used to access the left common femoral artery under direct ultrasound guidance and a permanent image was performed.  A 0.035 J wire was advanced without resistance and a 5Fr sheath was placed.  Pigtail catheter was placed into the aorta and an AP aortogram was performed. This demonstrated normal renal arteries and aorta and iliac segments without significant stenosis with extensive stents throughout the distal aorta and bilateral iliac artery that were all patent but creating a very steep aortic bifurcation. I then crossed the aortic bifurcation and advanced to the right femoral head.  This was extremely tedious due to the aortoiliac stents creating extremely steep aortic bifurcation.  A variety of wires and catheters were attempted, but ultimately a V S1 catheter was used to hook the new aortic bifurcation and we could advance a floppy Glidewire and then a glide catheter down, but were unable to get any more stable support for consideration for treatment.  Selective right lower extremity angiogram was then performed. This demonstrated that the right common femoral artery endarterectomy  site is widely patent.  There is narrowing of the origin of the profunda femoris artery that created a greater than 60% stenosis.  The SFA was occluded essentially at its origin with reconstitution of the proximal to mid popliteal artery with what appeared to be two-vessel runoff distally although it was sluggish and slow.  At this point, attempts at placing a stiffer wire through the glide catheter to consider up and over  treatment were performed, but this ultimately resulted in pushing the glide catheter back into the aorta and not able to gain stable appropriate wire position or sheath placement for right lower extremity intervention.  Any consideration for an endovascular attempted intervention on the right profunda femoris stenosis would have to come from an upper extremity approach.  I elected to terminate the procedure. The sheath was removed and StarClose closure device was deployed in the left femoral artery with excellent hemostatic result. The patient was taken to the recovery room in stable condition having tolerated the procedure well. ? ?Findings:   ?            Aortogram:  This demonstrated normal renal arteries and aorta and iliac segments without significant stenosis with extensive stents throughout the distal aorta and bilateral iliac artery that were all patent but creating a very steep aortic bifurcation. ?            Right lower Extremity: This demonstrated that the right common femoral artery endarterectomy site is widely patent.  There is narrowing of the origin of the profunda femoris artery that created a greater than 60% stenosis.  The SFA was occluded essentially at its origin with reconstitution of the proximal to mid popliteal artery with what appeared to be two-vessel runoff distally although it was sluggish and slow ? ? ?Disposition: Patient was taken to the recovery room in stable condition having tolerated the procedure well. ? ?Complications: None ? ?Leotis Pain ?12/19/2021 ?11:55 AM ? ? ?This note was created with Dragon Medical transcription system. Any errors in dictation are purely unintentional.  ?

## 2022-01-17 ENCOUNTER — Encounter (INDEPENDENT_AMBULATORY_CARE_PROVIDER_SITE_OTHER): Payer: MEDICAID

## 2022-01-17 ENCOUNTER — Ambulatory Visit (INDEPENDENT_AMBULATORY_CARE_PROVIDER_SITE_OTHER): Payer: MEDICAID | Admitting: Vascular Surgery

## 2022-01-17 ENCOUNTER — Other Ambulatory Visit (INDEPENDENT_AMBULATORY_CARE_PROVIDER_SITE_OTHER): Payer: Self-pay | Admitting: Vascular Surgery

## 2022-01-17 DIAGNOSIS — I70201 Unspecified atherosclerosis of native arteries of extremities, right leg: Secondary | ICD-10-CM

## 2022-01-17 DIAGNOSIS — I70221 Atherosclerosis of native arteries of extremities with rest pain, right leg: Secondary | ICD-10-CM

## 2022-01-18 ENCOUNTER — Ambulatory Visit (INDEPENDENT_AMBULATORY_CARE_PROVIDER_SITE_OTHER): Payer: Self-pay | Admitting: Nurse Practitioner

## 2022-01-18 ENCOUNTER — Encounter (INDEPENDENT_AMBULATORY_CARE_PROVIDER_SITE_OTHER): Payer: MEDICAID

## 2022-01-18 ENCOUNTER — Encounter (INDEPENDENT_AMBULATORY_CARE_PROVIDER_SITE_OTHER): Payer: Self-pay | Admitting: Nurse Practitioner

## 2022-01-18 VITALS — BP 143/80 | HR 60 | Resp 16 | Wt 178.0 lb

## 2022-01-18 DIAGNOSIS — I70221 Atherosclerosis of native arteries of extremities with rest pain, right leg: Secondary | ICD-10-CM

## 2022-01-18 DIAGNOSIS — F17200 Nicotine dependence, unspecified, uncomplicated: Secondary | ICD-10-CM

## 2022-01-25 ENCOUNTER — Telehealth (INDEPENDENT_AMBULATORY_CARE_PROVIDER_SITE_OTHER): Payer: Self-pay

## 2022-01-25 NOTE — Telephone Encounter (Signed)
Spoke with the patient and he is scheduled with Dr. Wyn Quaker on 01/30/22 for a RLE angio with pedal approach at the [facility] with a 9:30 am arrival time. Pre-procedure instructions were discussed and will be mailed. ?

## 2022-01-29 ENCOUNTER — Encounter (INDEPENDENT_AMBULATORY_CARE_PROVIDER_SITE_OTHER): Payer: Self-pay | Admitting: Nurse Practitioner

## 2022-01-29 NOTE — Progress Notes (Signed)
? ?Subjective:  ? ? Patient ID: Brendan Williams, male    DOB: [birth year], 61 y.o.   MRN: [id number] ?Chief Complaint  ?Patient presents with  ? Follow-up  ? ? ? The patient returns to the office for followup and review of the noninvasive studies.  The patient underwent angiogram on 12/19/2021 however it was unsuccessful due to being able to cross the occlusion in the SFA.  Since that time the patient notes worsening claudication which is severe and some mild rest pain as well.  There are still no open wounds or ulcerations.  There have been no significant changes to the patient's overall health care. ?  ?The patient denies amaurosis fugax or recent TIA symptoms. There are no recent neurological changes noted. ?The patient denies history of DVT, PE or superficial thrombophlebitis. ?The patient denies recent episodes of angina or shortness of breath.  ? ? ?Review of Systems  ?Skin:  Positive for color change.  ?All other systems reviewed and are negative. ? ?   ?Objective:  ? Physical Exam ?Vitals reviewed.  ?HENT:  ?   Head: Normocephalic.  ?Cardiovascular:  ?   Rate and Rhythm: Normal rate.  ?Pulmonary:  ?   Effort: Pulmonary effort is normal.  ?Skin: ?   General: Skin is dry.  ?Neurological:  ?   Mental Status: He is alert and oriented to person, place, and time.  ?Psychiatric:     ?   Mood and Affect: Mood normal.     ?   Behavior: Behavior normal.     ?   Thought Content: Thought content normal.     ?   Judgment: Judgment normal.  ? ? ?BP (!) 143/80 (BP Location: Right Arm)   Pulse 60   Resp 16   Wt 178 lb (80.7 kg)   BMI 23.48 kg/m?  ? ?Past Medical History:  ?Diagnosis Date  ? Peripheral arterial disease (HCC)   ? ? ?Social History  ? ?Socioeconomic History  ? Marital status: Married  ?  Spouse name: Not on file  ? Number of children: Not on file  ? Years of education: Not on file  ? Highest education level: Not on file  ?Occupational History  ? Not on file  ?Tobacco Use  ? Smoking status: Every Day  ?   Packs/day: 1.00  ?  Types: Cigarettes  ? Smokeless tobacco: Never  ?Vaping Use  ? Vaping Use: Never used  ?Substance and Sexual Activity  ? Alcohol use: Yes  ?  Alcohol/week: 5.0 standard drinks  ?  Types: 5 Cans of beer per week  ?  Comment: 4-5 beers a week  ? Drug use: Yes  ?  Frequency: 3.0 times per week  ?  Types: Marijuana  ?  Comment: 3-4 times a week  ? Sexual activity: Yes  ?Other Topics Concern  ? Not on file  ?Social History Narrative  ? Live in private residence with wife, Steward Drone.  ? ?Social Determinants of Health  ? ?Financial Resource Strain: Not on file  ?Food Insecurity: Not on file  ?Transportation Needs: Not on file  ?Physical Activity: Not on file  ?Stress: Not on file  ?Social Connections: Not on file  ?Intimate Partner Violence: Not on file  ? ? ?Past Surgical History:  ?Procedure Laterality Date  ? AMPUTATION TOE Right 12/02/2019  ? Procedure: AMPUTATION RIGHT 5TH TOE;  Surgeon: Rosetta Posner, DPM;  Location: [facility];  Service: Podiatry;  Laterality: Right;  ? APPLICATION OF WOUND  VAC Bilateral 11/28/2019  ? Procedure: APPLICATION OF WOUND VAC;  Surgeon: Annice Needy, MD;  Location: [facility];  Service: Vascular;  Laterality: Bilateral;  Prevena   ? BACK SURGERY    ? lower ruptured disk  ? CENTRAL VENOUS CATHETER INSERTION  11/28/2019  ? Procedure: INSERTION CENTRAL LINE ADULT;  Surgeon: Annice Needy, MD;  Location: [facility];  Service: Vascular;;  ? ENDARTERECTOMY FEMORAL Bilateral 11/28/2019  ? Procedure: ENDARTERECTOMY FEMORAL;  Surgeon: Annice Needy, MD;  Location: [facility];  Service: Vascular;  Laterality: Bilateral;  ? LOWER EXTREMITY ANGIOGRAPHY Right 11/14/2019  ? Procedure: LOWER EXTREMITY ANGIOGRAPHY;  Surgeon: Annice Needy, MD;  Location: [facility];  Service: Cardiovascular;  Laterality: Right;  ? LOWER EXTREMITY ANGIOGRAPHY Right 12/19/2021  ? Procedure: Lower Extremity Angiography;  Surgeon: Annice Needy, MD;  Location: [facility];  Service: Cardiovascular;   Laterality: Right;  ? STOMACH SURGERY    ? TONSILECTOMY, ADENOIDECTOMY, BILATERAL MYRINGOTOMY AND TUBES    ? ? ?Family History  ?Problem Relation Age of Onset  ? Leukemia Mother   ? Heart disease Father   ?     a. CABG  ? Heart disease Sister   ?     a. MVR  ? Cancer Sister   ? Cancer Brother   ? CAD Brother   ?     a. CABG  ? ? ?No Known Allergies ? ? ?  Latest Ref Rng & Units 09/01/2021  ? 12:55 PM 12/04/2019  ?  5:23 AM 12/03/2019  ? 12:40 AM  ?CBC  ?WBC 4.0 - 10.5 K/uL 11.4   9.7   10.9    ?Hemoglobin 13.0 - 17.0 g/dL 37.6   8.2   7.6    ?Hematocrit 39.0 - 52.0 % 46.0   24.9   23.3    ?Platelets 150 - 400 K/uL 363   476   384    ? ? ? ? ?CMP  ?   ?Component Value Date/Time  ? NA 134 (L) 09/01/2021 1255  ? K 3.4 (L) 09/01/2021 1255  ? CL 101 09/01/2021 1255  ? CO2 22 09/01/2021 1255  ? GLUCOSE 124 (H) 09/01/2021 1255  ? BUN 10 12/19/2021 1030  ? CREATININE 0.98 12/19/2021 1030  ? CALCIUM 9.1 09/01/2021 1255  ? PROT 7.8 09/01/2021 1255  ? ALBUMIN 3.7 09/01/2021 1255  ? AST 17 09/01/2021 1255  ? ALT 12 09/01/2021 1255  ? ALKPHOS 68 09/01/2021 1255  ? BILITOT 1.1 09/01/2021 1255  ? GFRNONAA >60 12/19/2021 1030  ? GFRAA >60 12/04/2019 0523  ? ? ? ?VAS [place] ABI WITH/WO TBI ? ?Result Date: 12/21/2021 ? LOWER EXTREMITY DOPPLER STUDY Patient Name:  Brendan Williams  Date of Exam:   12/14/2021 Medical Rec #: [id number]         Accession #:    [id number] Date of Birth: [birth year]          Patient Gender: M Patient Age:   64 years Exam Location:  [facility] Procedure:      VAS [place] ABI WITH/WO TBI Referring Phys: Festus Barren --------------------------------------------------------------------------------  Indications: Peripheral artery disease, and Numbness Right Foot.  Vascular Interventions: 11/14/27: Bilateral CIA and EIA Stents.                          11/28/2019: Aorta, Bilateral CIA, Bilateral EIA and  Bilateral SFA Stents with Right ATA Angioplasty. Comparison Study: 08/11/2020 Performing  Technologist: Solomon Mcclary RVS  Examination Guidelines: A complete evaluation includes at minimum, Doppler waveform signals and systolic blood pressure reading at the level of bilateral brachial, anterior tibial, and posterior tibial arteries, when vessel segments are accessible. Bilateral testing is considered an integral part of a complete examination. Photoelectric Plethysmograph (PPG) waveforms and toe systolic pressure readings are included as required and additional duplex testing as needed. Limited examinations for reoccurring indications may be performed as noted.  ABI Findings: +---------+------------------+-----+----------+--------+ Right    Rt Pressure (mmHg)IndexWaveform  Comment  +---------+------------------+-----+----------+--------+ Brachial 117                                       +---------+------------------+-----+----------+--------+ ATA      103               0.72 monophasic         +---------+------------------+-----+----------+--------+ PTA      87                0.61 monophasic         +---------+------------------+-----+----------+--------+ Great Toe36                0.25 Abnormal           +---------+------------------+-----+----------+--------+ +---------+------------------+-----+----------+-------+ Left     Lt Pressure (mmHg)IndexWaveform  Comment +---------+------------------+-----+----------+-------+ Brachial 143                                      +---------+------------------+-----+----------+-------+ ATA      101               0.71 monophasic        +---------+------------------+-----+----------+-------+ PTA      118               0.83 monophasic        +---------+------------------+-----+----------+-------+ Great Toe71                0.50 Abnormal          +---------+------------------+-----+----------+-------+ +-------+-----------+-----------+------------+------------+ ABI/TBIToday's ABIToday's TBIPrevious  ABIPrevious TBI +-------+-----------+-----------+------------+------------+ Right  .72        .25        1.13        1.09         +-------+-----------+-----------+------------+------------+ Left   .83        .50        1.21        .93          +-------+-----------+-----------+------------+-----

## 2022-01-29 NOTE — H&P (View-Only) (Signed)
? ?Subjective:  ? ? Patient ID: Brendan Williams, male    DOB: [birth year], 61 y.o.   MRN: [id number] ?Chief Complaint  ?Patient presents with  ? Follow-up  ? ? ? The patient returns to the office for followup and review of the noninvasive studies.  The patient underwent angiogram on 12/19/2021 however it was unsuccessful due to being able to cross the occlusion in the SFA.  Since that time the patient notes worsening claudication which is severe and some mild rest pain as well.  There are still no open wounds or ulcerations.  There have been no significant changes to the patient's overall health care. ?  ?The patient denies amaurosis fugax or recent TIA symptoms. There are no recent neurological changes noted. ?The patient denies history of DVT, PE or superficial thrombophlebitis. ?The patient denies recent episodes of angina or shortness of breath.  ? ? ?Review of Systems  ?Skin:  Positive for color change.  ?All other systems reviewed and are negative. ? ?   ?Objective:  ? Physical Exam ?Vitals reviewed.  ?HENT:  ?   Head: Normocephalic.  ?Cardiovascular:  ?   Rate and Rhythm: Normal rate.  ?Pulmonary:  ?   Effort: Pulmonary effort is normal.  ?Skin: ?   General: Skin is dry.  ?Neurological:  ?   Mental Status: He is alert and oriented to person, place, and time.  ?Psychiatric:     ?   Mood and Affect: Mood normal.     ?   Behavior: Behavior normal.     ?   Thought Content: Thought content normal.     ?   Judgment: Judgment normal.  ? ? ?BP (!) 143/80 (BP Location: Right Arm)   Pulse 60   Resp 16   Wt 178 lb (80.7 kg)   BMI 23.48 kg/m?  ? ?Past Medical History:  ?Diagnosis Date  ? Peripheral arterial disease (HCC)   ? ? ?Social History  ? ?Socioeconomic History  ? Marital status: Married  ?  Spouse name: Not on file  ? Number of children: Not on file  ? Years of education: Not on file  ? Highest education level: Not on file  ?Occupational History  ? Not on file  ?Tobacco Use  ? Smoking status: Every Day  ?   Packs/day: 1.00  ?  Types: Cigarettes  ? Smokeless tobacco: Never  ?Vaping Use  ? Vaping Use: Never used  ?Substance and Sexual Activity  ? Alcohol use: Yes  ?  Alcohol/week: 5.0 standard drinks  ?  Types: 5 Cans of beer per week  ?  Comment: 4-5 beers a week  ? Drug use: Yes  ?  Frequency: 3.0 times per week  ?  Types: Marijuana  ?  Comment: 3-4 times a week  ? Sexual activity: Yes  ?Other Topics Concern  ? Not on file  ?Social History Narrative  ? Live in private residence with wife, Steward Drone.  ? ?Social Determinants of Health  ? ?Financial Resource Strain: Not on file  ?Food Insecurity: Not on file  ?Transportation Needs: Not on file  ?Physical Activity: Not on file  ?Stress: Not on file  ?Social Connections: Not on file  ?Intimate Partner Violence: Not on file  ? ? ?Past Surgical History:  ?Procedure Laterality Date  ? AMPUTATION TOE Right 12/02/2019  ? Procedure: AMPUTATION RIGHT 5TH TOE;  Surgeon: Rosetta Posner, DPM;  Location: [facility];  Service: Podiatry;  Laterality: Right;  ? APPLICATION OF WOUND  VAC Bilateral 11/28/2019  ? Procedure: APPLICATION OF WOUND VAC;  Surgeon: Annice Needy, MD;  Location: [facility];  Service: Vascular;  Laterality: Bilateral;  Prevena   ? BACK SURGERY    ? lower ruptured disk  ? CENTRAL VENOUS CATHETER INSERTION  11/28/2019  ? Procedure: INSERTION CENTRAL LINE ADULT;  Surgeon: Annice Needy, MD;  Location: [facility];  Service: Vascular;;  ? ENDARTERECTOMY FEMORAL Bilateral 11/28/2019  ? Procedure: ENDARTERECTOMY FEMORAL;  Surgeon: Annice Needy, MD;  Location: [facility];  Service: Vascular;  Laterality: Bilateral;  ? LOWER EXTREMITY ANGIOGRAPHY Right 11/14/2019  ? Procedure: LOWER EXTREMITY ANGIOGRAPHY;  Surgeon: Annice Needy, MD;  Location: [facility];  Service: Cardiovascular;  Laterality: Right;  ? LOWER EXTREMITY ANGIOGRAPHY Right 12/19/2021  ? Procedure: Lower Extremity Angiography;  Surgeon: Annice Needy, MD;  Location: [facility];  Service: Cardiovascular;   Laterality: Right;  ? STOMACH SURGERY    ? TONSILECTOMY, ADENOIDECTOMY, BILATERAL MYRINGOTOMY AND TUBES    ? ? ?Family History  ?Problem Relation Age of Onset  ? Leukemia Mother   ? Heart disease Father   ?     a. CABG  ? Heart disease Sister   ?     a. MVR  ? Cancer Sister   ? Cancer Brother   ? CAD Brother   ?     a. CABG  ? ? ?No Known Allergies ? ? ?  Latest Ref Rng & Units 09/01/2021  ? 12:55 PM 12/04/2019  ?  5:23 AM 12/03/2019  ? 12:40 AM  ?CBC  ?WBC 4.0 - 10.5 K/uL 11.4   9.7   10.9    ?Hemoglobin 13.0 - 17.0 g/dL 37.6   8.2   7.6    ?Hematocrit 39.0 - 52.0 % 46.0   24.9   23.3    ?Platelets 150 - 400 K/uL 363   476   384    ? ? ? ? ?CMP  ?   ?Component Value Date/Time  ? NA 134 (L) 09/01/2021 1255  ? K 3.4 (L) 09/01/2021 1255  ? CL 101 09/01/2021 1255  ? CO2 22 09/01/2021 1255  ? GLUCOSE 124 (H) 09/01/2021 1255  ? BUN 10 12/19/2021 1030  ? CREATININE 0.98 12/19/2021 1030  ? CALCIUM 9.1 09/01/2021 1255  ? PROT 7.8 09/01/2021 1255  ? ALBUMIN 3.7 09/01/2021 1255  ? AST 17 09/01/2021 1255  ? ALT 12 09/01/2021 1255  ? ALKPHOS 68 09/01/2021 1255  ? BILITOT 1.1 09/01/2021 1255  ? GFRNONAA >60 12/19/2021 1030  ? GFRAA >60 12/04/2019 0523  ? ? ? ?VAS [place] ABI WITH/WO TBI ? ?Result Date: 12/21/2021 ? LOWER EXTREMITY DOPPLER STUDY Patient Name:  Brendan Williams  Date of Exam:   12/14/2021 Medical Rec #: [id number]         Accession #:    [id number] Date of Birth: [birth year]          Patient Gender: M Patient Age:   64 years Exam Location:  [facility] Procedure:      VAS [place] ABI WITH/WO TBI Referring Phys: Festus Barren --------------------------------------------------------------------------------  Indications: Peripheral artery disease, and Numbness Right Foot.  Vascular Interventions: 11/14/27: Bilateral CIA and EIA Stents.                          11/28/2019: Aorta, Bilateral CIA, Bilateral EIA and  Bilateral SFA Stents with Right ATA Angioplasty. Comparison Study: 08/11/2020 Performing  Technologist: Debbe BalesSolomon Mcclary RVS  Examination Guidelines: A complete evaluation includes at minimum, Doppler waveform signals and systolic blood pressure reading at the level of bilateral brachial, anterior tibial, and posterior tibial arteries, when vessel segments are accessible. Bilateral testing is considered an integral part of a complete examination. Photoelectric Plethysmograph (PPG) waveforms and toe systolic pressure readings are included as required and additional duplex testing as needed. Limited examinations for reoccurring indications may be performed as noted.  ABI Findings: +---------+------------------+-----+----------+--------+ Right    Rt Pressure (mmHg)IndexWaveform  Comment  +---------+------------------+-----+----------+--------+ Brachial 117                                       +---------+------------------+-----+----------+--------+ ATA      103               0.72 monophasic         +---------+------------------+-----+----------+--------+ PTA      87                0.61 monophasic         +---------+------------------+-----+----------+--------+ Great Toe36                0.25 Abnormal           +---------+------------------+-----+----------+--------+ +---------+------------------+-----+----------+-------+ Left     Lt Pressure (mmHg)IndexWaveform  Comment +---------+------------------+-----+----------+-------+ Brachial 143                                      +---------+------------------+-----+----------+-------+ ATA      101               0.71 monophasic        +---------+------------------+-----+----------+-------+ PTA      118               0.83 monophasic        +---------+------------------+-----+----------+-------+ Great Toe71                0.50 Abnormal          +---------+------------------+-----+----------+-------+ +-------+-----------+-----------+------------+------------+ ABI/TBIToday's ABIToday's TBIPrevious  ABIPrevious TBI +-------+-----------+-----------+------------+------------+ Right  .72        .25        1.13        1.09         +-------+-----------+-----------+------------+------------+ Left   .83        .50        1.21        .93          +-------+-----------+-----------+------------+-----

## 2022-01-30 ENCOUNTER — Ambulatory Visit: Payer: Self-pay | Admitting: Anesthesiology

## 2022-01-30 ENCOUNTER — Encounter: Payer: Self-pay | Admitting: Vascular Surgery

## 2022-01-30 ENCOUNTER — Ambulatory Visit
Admission: RE | Admit: 2022-01-30 | Discharge: 2022-01-30 | Disposition: A | Discharge status: Home / Self Care | Payer: Self-pay | Attending: Vascular Surgery | Admitting: Vascular Surgery

## 2022-01-30 ENCOUNTER — Encounter
Admission: RE | Disposition: A | Discharge status: Home / Self Care | Payer: Self-pay | Source: Home / Self Care | Attending: Vascular Surgery

## 2022-01-30 ENCOUNTER — Other Ambulatory Visit: Payer: Self-pay

## 2022-01-30 DIAGNOSIS — F17210 Nicotine dependence, cigarettes, uncomplicated: Secondary | ICD-10-CM | POA: Insufficient documentation

## 2022-01-30 DIAGNOSIS — I70229 Atherosclerosis of native arteries of extremities with rest pain, unspecified extremity: Secondary | ICD-10-CM

## 2022-01-30 DIAGNOSIS — T82868A Thrombosis of vascular prosthetic devices, implants and grafts, initial encounter: Secondary | ICD-10-CM

## 2022-01-30 DIAGNOSIS — I70221 Atherosclerosis of native arteries of extremities with rest pain, right leg: Secondary | ICD-10-CM | POA: Insufficient documentation

## 2022-01-30 HISTORY — PX: LOWER EXTREMITY ANGIOGRAPHY: CATH118251

## 2022-01-30 LAB — CREATININE, SERUM
Creatinine, Ser: 0.88 mg/dL (ref 0.61–1.24)
GFR, Estimated: >60 mL/min (ref >60)

## 2022-01-30 LAB — BUN: BUN: 6 mg/dL — ABNORMAL LOW (ref 8–23)

## 2022-01-30 SURGERY — LOWER EXTREMITY ANGIOGRAPHY
Anesthesia: Moderate Sedation | Laterality: Right

## 2022-01-30 MED ORDER — PROPOFOL 500 MG/50ML IV EMUL
INTRAVENOUS | Status: AC
  Filled 2022-01-30: qty 50

## 2022-01-30 MED ORDER — CEFAZOLIN SODIUM-DEXTROSE 2-4 GM/100ML-% IV SOLN
INTRAVENOUS | Status: AC
  Filled 2022-01-30: qty 100

## 2022-01-30 MED ORDER — SODIUM CHLORIDE 0.9 % IV SOLN: 250.0000 mL | INTRAVENOUS | Status: DC | PRN

## 2022-01-30 MED ORDER — FAMOTIDINE 20 MG PO TABS: 40.0000 mg | ORAL_TABLET | Freq: Once | ORAL | Status: DC | PRN

## 2022-01-30 MED ORDER — SODIUM CHLORIDE 0.9% FLUSH: 3.0000 mL | Freq: Two times a day (BID) | INTRAVENOUS | Status: DC

## 2022-01-30 MED ORDER — HEPARIN SODIUM (PORCINE) 1000 UNIT/ML IJ SOLN
INTRAMUSCULAR | Status: DC | PRN
  Administered 2022-01-30: 4000 [IU] via INTRAVENOUS

## 2022-01-30 MED ORDER — LABETALOL HCL 5 MG/ML IV SOLN: 10.0000 mg | INTRAVENOUS | Status: DC | PRN

## 2022-01-30 MED ORDER — PHENYLEPHRINE 40 MCG/ML (10ML) SYRINGE FOR IV PUSH (FOR BLOOD PRESSURE SUPPORT)
PREFILLED_SYRINGE | INTRAVENOUS | Status: DC | PRN
  Administered 2022-01-30: 80 ug via INTRAVENOUS

## 2022-01-30 MED ORDER — ONDANSETRON HCL 4 MG/2ML IJ SOLN: 4.0000 mg | Freq: Four times a day (QID) | INTRAMUSCULAR | Status: DC | PRN

## 2022-01-30 MED ORDER — SODIUM CHLORIDE 0.9% FLUSH: 3.0000 mL | INTRAVENOUS | Status: DC | PRN

## 2022-01-30 MED ORDER — DIPHENHYDRAMINE HCL 50 MG/ML IJ SOLN: 50.0000 mg | Freq: Once | INTRAMUSCULAR | Status: DC | PRN

## 2022-01-30 MED ORDER — CEFAZOLIN SODIUM-DEXTROSE 2-4 GM/100ML-% IV SOLN
2.0000 g | INTRAVENOUS | Status: AC
  Administered 2022-01-30: 2 g via INTRAVENOUS

## 2022-01-30 MED ORDER — PHENYLEPHRINE HCL (PRESSORS) 10 MG/ML IV SOLN
INTRAVENOUS | Status: DC | PRN
  Administered 2022-01-30: 5 ug via INTRAVENOUS

## 2022-01-30 MED ORDER — IODIXANOL 320 MG/ML IV SOLN
INTRAVENOUS | Status: DC | PRN
  Administered 2022-01-30: 40 mL via INTRA_ARTERIAL

## 2022-01-30 MED ORDER — MIDAZOLAM HCL 2 MG/ML PO SYRP: 8.0000 mg | ORAL_SOLUTION | Freq: Once | ORAL | Status: DC | PRN

## 2022-01-30 MED ORDER — PROPOFOL 500 MG/50ML IV EMUL
INTRAVENOUS | Status: DC | PRN
  Administered 2022-01-30: 50 ug/kg/min via INTRAVENOUS

## 2022-01-30 MED ORDER — HYDRALAZINE HCL 20 MG/ML IJ SOLN: 5.0000 mg | INTRAMUSCULAR | Status: DC | PRN

## 2022-01-30 MED ORDER — HYDROMORPHONE HCL 1 MG/ML IJ SOLN: 1.0000 mg | Freq: Once | INTRAMUSCULAR | Status: DC | PRN

## 2022-01-30 MED ORDER — EPHEDRINE 5 MG/ML INJ
INTRAVENOUS | Status: AC
  Filled 2022-01-30: qty 10

## 2022-01-30 MED ORDER — PROPOFOL 10 MG/ML IV BOLUS
INTRAVENOUS | Status: DC | PRN
  Administered 2022-01-30: 40 mg via INTRAVENOUS
  Administered 2022-01-30: 20 mg via INTRAVENOUS
  Administered 2022-01-30: 50 mg via INTRAVENOUS
  Administered 2022-01-30: 30 mg via INTRAVENOUS

## 2022-01-30 MED ORDER — MIDAZOLAM HCL 2 MG/2ML IJ SOLN
INTRAMUSCULAR | Status: AC
  Filled 2022-01-30: qty 2

## 2022-01-30 MED ORDER — LIDOCAINE HCL (CARDIAC) PF 100 MG/5ML IV SOSY
PREFILLED_SYRINGE | INTRAVENOUS | Status: DC | PRN
  Administered 2022-01-30: 50 mg via INTRATRACHEAL

## 2022-01-30 MED ORDER — ACETAMINOPHEN 325 MG PO TABS: 650.0000 mg | ORAL_TABLET | ORAL | Status: DC | PRN

## 2022-01-30 MED ORDER — FENTANYL CITRATE (PF) 100 MCG/2ML IJ SOLN
INTRAMUSCULAR | Status: AC
  Filled 2022-01-30: qty 2

## 2022-01-30 MED ORDER — EPHEDRINE SULFATE (PRESSORS) 50 MG/ML IJ SOLN
INTRAMUSCULAR | Status: DC | PRN
  Administered 2022-01-30: 5 mg via INTRAVENOUS
  Administered 2022-01-30 (×3): 10 mg via INTRAVENOUS
  Administered 2022-01-30 (×2): 5 mg via INTRAVENOUS

## 2022-01-30 MED ORDER — SODIUM CHLORIDE 0.9 % IV SOLN: INTRAVENOUS | Status: DC

## 2022-01-30 MED ORDER — METHYLPREDNISOLONE SODIUM SUCC 125 MG IJ SOLR
125.0000 mg | Freq: Once | INTRAMUSCULAR | Status: DC | PRN
Start: 2022-01-30 — End: 2022-01-30

## 2022-01-30 MED ORDER — MIDAZOLAM HCL 2 MG/2ML IJ SOLN
INTRAMUSCULAR | Status: DC | PRN
  Administered 2022-01-30: 2 mg via INTRAVENOUS

## 2022-01-30 MED ORDER — ONDANSETRON HCL 4 MG/2ML IJ SOLN
INTRAMUSCULAR | Status: DC | PRN
  Administered 2022-01-30: 4 mg via INTRAVENOUS

## 2022-01-30 MED ORDER — FENTANYL CITRATE (PF) 100 MCG/2ML IJ SOLN
INTRAMUSCULAR | Status: DC | PRN
  Administered 2022-01-30 (×4): 25 ug via INTRAVENOUS

## 2022-01-30 SURGICAL SUPPLY — 29 items
BALLN LUTONIX 018 5X150X130 (BALLOONS) ×2
BALLN LUTONIX 018 5X300X130 (BALLOONS) ×4
BALLN LUTONIX 018 5X40X130 (BALLOONS) ×2
BALLN ULTRVRSE 3X80X150 (BALLOONS) ×2
BALLN ULTRVRSE 3X80X150 OTW (BALLOONS) ×1
BALLOON LUTONIX 018 5X150X130 (BALLOONS) IMPLANT
BALLOON LUTONIX 018 5X300X130 (BALLOONS) IMPLANT
BALLOON LUTONIX 018 5X40X130 (BALLOONS) IMPLANT
BALLOON ULTRVRSE 3X80X150 OTW (BALLOONS) IMPLANT
CATH BEACON 5 .038 100 VERT TP (CATHETERS) ×1 IMPLANT
CATH NAVICROSS ANGLED 90CM (MICROCATHETER) ×1 IMPLANT
CATH ROTAREX 135 6FR (CATHETERS) ×1 IMPLANT
COVER PROBE U/S 5X48 (MISCELLANEOUS) ×1 IMPLANT
DEVICE RAD COMP TR BAND LRG (VASCULAR PRODUCTS) ×1 IMPLANT
DRAPE FEMORAL ANGIO W/ POUCH (DRAPES) ×1 IMPLANT
DRAPE INCISE IOBAN 66X45 STRL (DRAPES) ×1 IMPLANT
GAUZE SPONGE 4X4 12PLY STRL (GAUZE/BANDAGES/DRESSINGS) ×3 IMPLANT
GLIDESHEATH SLEND SS 6F .021 (SHEATH) ×1 IMPLANT
GLIDEWIRE ADV .035X180CM (WIRE) ×1 IMPLANT
KIT ENCORE 26 ADVANTAGE (KITS) ×1 IMPLANT
NDL ENTRY 21GA 7CM ECHOTIP (NEEDLE) IMPLANT
NEEDLE ENTRY 21GA 7CM ECHOTIP (NEEDLE) ×2 IMPLANT
PACK ANGIOGRAPHY (CUSTOM PROCEDURE TRAY) ×2 IMPLANT
SHEATH GLIDE SLENDER 4/5FR (SHEATH) ×1 IMPLANT
STENT VIABAHN 6X150X120 (Permanent Stent) ×1 IMPLANT
STENT VIABAHN 6X250X120 (Permanent Stent) ×1 IMPLANT
STENT VIABAHN 6X25X120 (Permanent Stent) ×1 IMPLANT
WIRE G V18X300CM (WIRE) ×2 IMPLANT
WIRE GUIDERIGHT .035X150 (WIRE) ×1 IMPLANT

## 2022-01-30 NOTE — Op Note (Signed)
[facility] ? Percutaneous Study/Intervention Procedural Note ? ? ?Date of Surgery: 01/30/2022 ? ?Surgeon(s):Jammie Troup   ? ?Assistants:none ? ?Pre-operative Diagnosis: PAD with rest pain right lower extremity ? ?Post-operative diagnosis:  Same ? ?Procedure(s) Performed: ?            1.  Ultrasound guidance for vascular access right posterior tibial artery ?            2.  Catheter placement into right common femoral artery from right posterior tibial approach ?            3.  Selective right lower extremity angiogram ?            4.  Percutaneous transluminal angioplasty of right posterior tibial artery and tibioperoneal trunk with 3 mm diameter by 8 cm length angioplasty balloon ?            5.  Mechanical thrombectomy of the right SFA and popliteal arteries with the [place] Rex device ? 6.  Percutaneous transluminal angioplasty of the right SFA and popliteal arteries with 5 mm diameter by 30 cm length and 5 mm diameter by 15 cm length Lutonix drug-coated angioplasty balloon ? 7.  Viabahn stent placement x3 to the right SFA and popliteal arteries with a 6 mm diameter by 25 cm length, 6 mm diameter by 15 cm length, and a 6 mm diameter by 2.5 cm length stent ?       ? ?EBL: 75 cc ? ?Contrast: 40 cc ? ?Fluoro Time: 11.3 minutes ? ?Anesthesia: MAC ?             ?Indications:  Patient is a 61 y.o.male with EAD with rest pain. The patient has noninvasive study showing reduced perfusion and has a previous angiogram showing a long segment right SFA and popliteal occlusion that could not be crossed from an up and over approach.  The patient is brought in for angiography for further evaluation and potential treatment.  Due to the limb threatening nature of the situation, angiogram was performed for attempted limb salvage. The patient is aware that if the procedure fails, amputation would be expected.  The patient also understands that even with successful revascularization, amputation may still be  required due to the severity of the situation.  Risks and benefits are discussed and informed consent is obtained.  ? ?Procedure:  The patient was identified and appropriate procedural time out was performed.  The patient was then placed supine on the table and prepped and draped in the usual sterile fashion. Moderate conscious sedation was administered during a face to face encounter with the patient throughout the procedure with my supervision of the RN administering medicines and monitoring the patient's vital signs, pulse oximetry, telemetry and mental status throughout from the start of the procedure until the patient was taken to the recovery room. Ultrasound was used to evaluate the right posterior tibial artery.  Imaging showed the known occlusion in the popliteal artery with occlusion of the right SFA stents.  There is also a moderate stenosis in the tibioperoneal trunk and proximal posterior tibial artery was seen better from a retrograde approach than the sluggish flow from above.  This was in the 70 to 80% range.  Using an advantage wire and a Kumpe catheter and then a Nava cross catheter was able to easily cross the occlusion and get into the right common femoral artery.  Selective right lower extremity angiogram was then performed. This demonstrated the flush occlusion of  the right SFA with good flow in the common femoral profunda femoris artery and the popliteal and tibial flow was good beyond the proximal stenosis in the posterior tibial artery and tibioperoneal trunk.  The anterior tibial artery was patent and the peroneal artery was patent below the tibioperoneal trunk stenosis. It was felt that it was in the patient's best interest to proceed with intervention after these images to avoid a second procedure and a larger amount of contrast and fluoroscopy based off of the findings from the initial angiogram. The patient was systemically heparinized and a 6 French slender sheath was then placed over  the Terumo Advantage wire. I then performed angioplasty of the tibioperoneal trunk and proximal posterior tibial artery with a 3 mm diameter by 8 cm length angioplasty balloon inflated to 12 atm for 1 minute.  This was after exchanging for a V18 wire.  This showed about a 20 to 30% residual stenosis after angioplasty.  I then performed mechanical thrombectomy throughout the right popliteal and superficial femoral arteries with 2 passes with the [place] Rex device.  There remained occlusion following this, a 5 mm diameter by 30 cm length Lutonix drug-coated angioplasty balloon and a 5 mm diameter by 15 cm length Lutonix drug-coated angioplasty balloon were used to treat the right SFA in its entirety in the popliteal artery to the mid segment.  These inflations were 8 to 10 atm for 1 minute.  There remained residual stenosis distal to previously placed stent, and occlusion above the previously placed stent and I elected to place Viabahn stents above and below.  A 6 mm diameter by 25 cm length Viabahn stent was taken down to the mid popliteal artery and then bridged with a 6 mm diameter by 15 cm length Viabahn stent taken to about 2 cm from the origin of the right SFA.  There still remained a high-grade stenosis at the origin of the right SFA, and this was treated with a 6 mm diameter by 2.5 cm length Viabahn stent as well going just up to the origin of the SFA.  These were all postdilated with 5 mm balloon with excellent angiographic completion result and less than 10% residual stenosis in the right SFA and popliteal arteries.  The wire was then removed.  The sheath was removed from the posterior tibial artery and a TR band was placed for hemostasis which was achieved.  The patient was taken to the recovery room in stable condition having tolerated the procedure well. ? ?Findings:   ?             ?            Right lower Extremity: Flush occlusion of the right SFA with occlusion down to the mid popliteal artery below the  previously placed stents.  There is about a 70 to 80% stenosis in the tibioperoneal trunk and the proximal portion of the posterior tibial artery.  The anterior tibial artery is patent and provided runoff distally in the peroneal artery also provided additional runoff below the tibioperoneal trunk stenosis. ? ? ?Disposition: Patient was taken to the recovery room in stable condition having tolerated the procedure well. ? ?Complications: None ? ?Leotis Pain ?01/30/2022 ?1:19 PM ? ? ?This note was created with Dragon Medical transcription system. Any errors in dictation are purely unintentional.  ?

## 2022-01-30 NOTE — Interval H&P Note (Signed)
History and Physical Interval Note: ? ?01/30/2022 ?9:36 AM ? ?Brendan Williams  has presented today for surgery, with the diagnosis of RLE Angio w pedal approach   ASO w rest pain.  The various methods of treatment have been discussed with the patient and family. After consideration of risks, benefits and other options for treatment, the patient has consented to  Procedure(s): ?Lower Extremity Angiography (Right) as a surgical intervention.  The patient's history has been reviewed, patient examined, no change in status, stable for surgery.  I have reviewed the patient's chart and labs.  Questions were answered to the patient's satisfaction.   ? ? ?Festus Barren ? ? ?

## 2022-01-30 NOTE — Transfer of Care (Signed)
Immediate Anesthesia Transfer of Care Note ? ?Patient: Brendan Williams ? ?Procedure(s) Performed: Lower Extremity Angiography (Right) ? ?Patient Location: Short Stay ? ?Anesthesia Type:General ? ?Level of Consciousness: drowsy ? ?Airway & Oxygen Therapy: Patient Spontanous Breathing ? ?Post-op Assessment: Report given to RN and Post -op Vital signs reviewed and stable ? ?Post vital signs: Reviewed and stable ? ?Last Vitals:  ?Vitals Value Taken Time  ?BP 93/62 01/30/22 1316  ?Temp    ?Pulse 60 01/30/22 1318  ?Resp 14 01/30/22 1318  ?SpO2 95 % 01/30/22 1318  ?Vitals shown include unvalidated device data. ? ?Last Pain:  ?Vitals:  ? 01/30/22 1315  ?PainSc: Asleep  ?   ? ?  ? ?Complications: There were no known notable events for this encounter. ?

## 2022-01-31 NOTE — Anesthesia Preprocedure Evaluation (Signed)
Anesthesia Evaluation  ?Patient identified by MRN, date of birth, ID band ?Patient awake ? ? ? ?Reviewed: ?Allergy & Precautions, H&P , NPO status , Patient's Chart, lab work & pertinent test results, reviewed documented beta blocker date and time  ? ?Airway ?Mallampati: II ? ? ?Neck ROM: full ? ? ? Dental ? ?(+) Poor Dentition ?  ?Pulmonary ?neg pulmonary ROS, Current Smoker and Patient abstained from smoking.,  ?  ?Pulmonary exam normal ? ? ? ? ? ? ? Cardiovascular ?Exercise Tolerance: Poor ?(-) angina+ Peripheral Vascular Disease  ?(-) CHF, (-) Orthopnea and (-) PND negative cardio ROS ?Normal cardiovascular exam ?Rhythm:regular Rate:Normal ? ? ?  ?Neuro/Psych ?negative neurological ROS ? negative psych ROS  ? GI/Hepatic ?negative GI ROS, Neg liver ROS,   ?Endo/Other  ?negative endocrine ROS ? Renal/GU ?negative Renal ROS  ?negative genitourinary ?  ?Musculoskeletal ? ? Abdominal ?  ?Peds ? Hematology ?negative hematology ROS ?(+)   ?Anesthesia Other Findings ?Past Medical History: ?No date: Peripheral arterial disease (HCC) ?Past Surgical History: ?12/02/2019: AMPUTATION TOE; Right ?    Comment:  Procedure: AMPUTATION RIGHT 5TH TOE;  Surgeon: Excell Seltzer,  ?             Greig Castilla, DPM;  Location: [facility];  Service: Podiatry;   ?             Laterality: Right; ?11/28/2019: APPLICATION OF WOUND VAC; Bilateral ?    Comment:  Procedure: APPLICATION OF WOUND VAC;  Surgeon: Wyn Quaker,  ?             Marlow Baars, MD;  Location: [facility];  Service: Vascular;   ?             Laterality: Bilateral;  Prevena  ?No date: BACK SURGERY ?    Comment:  lower ruptured disk ?11/28/2019: CENTRAL VENOUS CATHETER INSERTION ?    Comment:  Procedure: INSERTION CENTRAL LINE ADULT;  Surgeon: Wyn Quaker,  ?             Marlow Baars, MD;  Location: [facility];  Service: Vascular;; ?11/28/2019: ENDARTERECTOMY FEMORAL; Bilateral ?    Comment:  Procedure: ENDARTERECTOMY FEMORAL;  Surgeon: Festus Barren  ?             S, MD;  Location: [facility];   Service: Vascular;   ?             Laterality: Bilateral; ?11/14/2019: LOWER EXTREMITY ANGIOGRAPHY; Right ?    Comment:  Procedure: LOWER EXTREMITY ANGIOGRAPHY;  Surgeon: Wyn Quaker,  ?             Marlow Baars, MD;  Location: [facility];  Service:  ?             Cardiovascular;  Laterality: Right; ?12/19/2021: LOWER EXTREMITY ANGIOGRAPHY; Right ?    Comment:  Procedure: Lower Extremity Angiography;  Surgeon: Wyn Quaker,  ?             Marlow Baars, MD;  Location: [facility];  Service:  ?             Cardiovascular;  Laterality: Right; ?No date: STOMACH SURGERY ?No date: TONSILECTOMY, ADENOIDECTOMY, BILATERAL MYRINGOTOMY AND TUBES ?BMI   ? Body Mass Index: 23.48 kg/m?  ?  ? Reproductive/Obstetrics ?negative OB ROS ? ?  ? ? ? ? ? ? ? ? ? ? ? ? ? ?  ?  ? ? ? ? ? ? ? ? ?Anesthesia Physical ?Anesthesia Plan ? ?ASA: 3 ? ?  Anesthesia Plan: General  ? ?Post-op Pain Management:   ? ?Induction:  ? ?PONV Risk Score and Plan:  ? ?Airway Management Planned:  ? ?Additional Equipment:  ? ?Intra-op Plan:  ? ?Post-operative Plan:  ? ?Informed Consent: I have reviewed the patients History and Physical, chart, labs and discussed the procedure including the risks, benefits and alternatives for the proposed anesthesia with the patient or authorized representative who has indicated his/her understanding and acceptance.  ? ? ? ?Dental Advisory Given ? ?Plan Discussed with: CRNA ? ?Anesthesia Plan Comments:   ? ? ? ? ? ? ?Anesthesia Quick Evaluation ? ?

## 2022-01-31 NOTE — Anesthesia Postprocedure Evaluation (Signed)
Anesthesia Post Note ? ?Patient: Brendan Williams ? ?Procedure(s) Performed: Lower Extremity Angiography (Right) ? ?Patient location during evaluation: PACU ?Anesthesia Type: General ?Level of consciousness: awake and alert ?Pain management: pain level controlled ?Vital Signs Assessment: post-procedure vital signs reviewed and stable ?Respiratory status: spontaneous breathing, nonlabored ventilation, respiratory function stable and patient connected to nasal cannula oxygen ?Cardiovascular status: blood pressure returned to baseline and stable ?Postop Assessment: no apparent nausea or vomiting ?Anesthetic complications: no ? ? ?There were no known notable events for this encounter. ? ? ?Last Vitals:  ?Vitals:  ? 01/30/22 1530 01/30/22 1545  ?BP:    ?Pulse: (!) 56 (!) 55  ?Resp:    ?Temp:    ?SpO2: 97% 98%  ?  ?Last Pain:  ?Vitals:  ? 01/30/22 1545  ?PainSc: 0-No pain  ? ? ?  ?  ?  ?  ?  ?  ? ?Yevette Edwards ? ? ? ? ?

## 2022-02-01 ENCOUNTER — Encounter: Payer: Self-pay | Admitting: Vascular Surgery

## 2022-02-27 ENCOUNTER — Other Ambulatory Visit (INDEPENDENT_AMBULATORY_CARE_PROVIDER_SITE_OTHER): Payer: Self-pay | Admitting: Vascular Surgery

## 2022-02-27 DIAGNOSIS — I70221 Atherosclerosis of native arteries of extremities with rest pain, right leg: Secondary | ICD-10-CM

## 2022-02-27 DIAGNOSIS — Z95820 Peripheral vascular angioplasty status with implants and grafts: Secondary | ICD-10-CM

## 2022-03-01 ENCOUNTER — Encounter (INDEPENDENT_AMBULATORY_CARE_PROVIDER_SITE_OTHER): Payer: Self-pay | Admitting: Nurse Practitioner

## 2022-03-01 ENCOUNTER — Ambulatory Visit (INDEPENDENT_AMBULATORY_CARE_PROVIDER_SITE_OTHER): Payer: Self-pay

## 2022-03-01 ENCOUNTER — Ambulatory Visit (INDEPENDENT_AMBULATORY_CARE_PROVIDER_SITE_OTHER): Payer: Self-pay | Admitting: Nurse Practitioner

## 2022-03-01 VITALS — BP 109/75 | HR 63 | Resp 17 | Ht 73.0 in | Wt 170.0 lb

## 2022-03-01 DIAGNOSIS — Z95820 Peripheral vascular angioplasty status with implants and grafts: Secondary | ICD-10-CM

## 2022-03-01 DIAGNOSIS — I70221 Atherosclerosis of native arteries of extremities with rest pain, right leg: Secondary | ICD-10-CM

## 2022-03-01 DIAGNOSIS — F17200 Nicotine dependence, unspecified, uncomplicated: Secondary | ICD-10-CM

## 2022-03-11 ENCOUNTER — Encounter (INDEPENDENT_AMBULATORY_CARE_PROVIDER_SITE_OTHER): Payer: Self-pay | Admitting: Nurse Practitioner

## 2022-03-11 NOTE — Progress Notes (Signed)
Subjective:    Patient ID: Brendan Williams, male    DOB: [birth year], 61 y.o.   MRN: [id number] No chief complaint on file.   The patient returns to the office for followup and review status post angiogram with intervention on 01/30/2022.   Procedure: Procedure(s) Performed:             1.  Ultrasound guidance for vascular access right posterior tibial artery             2.  Catheter placement into right common femoral artery from right posterior tibial approach             3.  Selective right lower extremity angiogram             4.  Percutaneous transluminal angioplasty of right posterior tibial artery and tibioperoneal trunk with 3 mm diameter by 8 cm length angioplasty balloon             5.  Mechanical thrombectomy of the right SFA and popliteal arteries with the R[place] Rex device             6.  Percutaneous transluminal angioplasty of the right SFA and popliteal arteries with 5 mm diameter by 30 cm length and 5 mm diameter by 15 cm length Lutonix drug-coated angioplasty balloon             7.  Viabahn stent placement x3 to the right SFA and popliteal arteries with a 6 mm diameter by 25 cm length, 6 mm diameter by 15 cm length, and a 6 mm diameter by 2.5 cm length stent  The patient notes improvement in the lower extremity symptoms. No interval shortening of the patient's claudication distance or rest pain symptoms. No new ulcers or wounds have occurred since the last visit.  There have been no significant changes to the patient's overall health care.  No documented history of amaurosis fugax or recent TIA symptoms. There are no recent neurological changes noted. No documented history of DVT, PE or superficial thrombophlebitis. The patient denies recent episodes of angina or shortness of breath.   ABI's Rt=1.02 and Lt=0.80  (previous ABI's Rt=0.72 and Lt=0.83) Duplex US of the right lower extremity shows biphasic waveforms with good toe waveforms.  Monophasic waveforms in the left  with some dampened toe waveforms.   Review of Systems  All other systems reviewed and are negative.     Objective:   Physical Exam Vitals reviewed.  HENT:     Head: Normocephalic.  Cardiovascular:     Rate and Rhythm: Normal rate.     Pulses:          Radial pulses are 1+ on the right side and 1+ on the left side.       Dorsalis pedis pulses are detected w/ Doppler on the right side and detected w/ Doppler on the left side.       Posterior tibial pulses are detected w/ Doppler on the right side and detected w/ Doppler on the left side.  Pulmonary:     Effort: Pulmonary effort is normal.  Skin:    General: Skin is warm and dry.  Neurological:     Mental Status: He is alert and oriented to person, place, and time.  Psychiatric:        Mood and Affect: Mood normal.        Behavior: Behavior normal.        Thought Content: Thought content normal.  Judgment: Judgment normal.    BP 109/75 (BP Location: Left Arm)   Pulse 63   Resp 17   Ht 6\' 1"  (1.854 m)   Wt 170 lb (77.1 kg)   BMI 22.43 kg/m   Past Medical History:  Diagnosis Date   Peripheral arterial disease (HCC)     Social History   Socioeconomic History   Marital status: Married    Spouse name: Not on file   Number of children: Not on file   Years of education: Not on file   Highest education level: Not on file  Occupational History   Not on file  Tobacco Use   Smoking status: Every Day    Packs/day: 1.00    Types: Cigarettes   Smokeless tobacco: Never  Vaping Use   Vaping Use: Never used  Substance and Sexual Activity   Alcohol use: Yes    Alcohol/week: 5.0 standard drinks    Types: 5 Cans of beer per week    Comment: 4-5 beers a week   Drug use: Yes    Frequency: 3.0 times per week    Types: Marijuana    Comment: 3-4 times a week   Sexual activity: Yes  Other Topics Concern   Not on file  Social History Narrative   Live in private residence with wife, Steward DroneBrenda.   Social Determinants of  Health   Financial Resource Strain: Not on file  Food Insecurity: Not on file  Transportation Needs: Not on file  Physical Activity: Not on file  Stress: Not on file  Social Connections: Not on file  Intimate Partner Violence: Not on file    Past Surgical History:  Procedure Laterality Date   AMPUTATION TOE Right 12/02/2019   Procedure: AMPUTATION RIGHT 5TH TOE;  Surgeon: Rosetta PosnerBaker, Andrew, DPM;  Location: [facility];  Service: Podiatry;  Laterality: Right;   APPLICATION OF WOUND VAC Bilateral 11/28/2019   Procedure: APPLICATION OF WOUND VAC;  Surgeon: Annice Needyew, Jason S, MD;  Location: [facility];  Service: Vascular;  Laterality: Bilateral;  Prevena    BACK SURGERY     lower ruptured disk   CENTRAL VENOUS CATHETER INSERTION  11/28/2019   Procedure: INSERTION CENTRAL LINE ADULT;  Surgeon: Annice Needyew, Jason S, MD;  Location: [facility];  Service: Vascular;;   ENDARTERECTOMY FEMORAL Bilateral 11/28/2019   Procedure: ENDARTERECTOMY FEMORAL;  Surgeon: Annice Needyew, Jason S, MD;  Location: [facility];  Service: Vascular;  Laterality: Bilateral;   LOWER EXTREMITY ANGIOGRAPHY Right 11/14/2019   Procedure: LOWER EXTREMITY ANGIOGRAPHY;  Surgeon: Annice Needyew, Jason S, MD;  Location: [facility];  Service: Cardiovascular;  Laterality: Right;   LOWER EXTREMITY ANGIOGRAPHY Right 12/19/2021   Procedure: Lower Extremity Angiography;  Surgeon: Annice Needyew, Jason S, MD;  Location: [facility];  Service: Cardiovascular;  Laterality: Right;   LOWER EXTREMITY ANGIOGRAPHY Right 01/30/2022   Procedure: Lower Extremity Angiography;  Surgeon: Annice Needyew, Jason S, MD;  Location: [facility];  Service: Cardiovascular;  Laterality: Right;   STOMACH SURGERY     TONSILECTOMY, ADENOIDECTOMY, BILATERAL MYRINGOTOMY AND TUBES      Family History  Problem Relation Age of Onset   Leukemia Mother    Heart disease Father        a. CABG   Heart disease Sister        a. MVR   Cancer Sister    Cancer Brother    CAD Brother        a. CABG    No  Known  Allergies     Latest Ref Rng & Units 09/01/2021   12:55 PM 12/04/2019    5:23 AM 12/03/2019   12:40 AM  CBC  WBC 4.0 - 10.5 K/uL 11.4   9.7   10.9    Hemoglobin 13.0 - 17.0 g/dL 16.1   8.2   7.6    Hematocrit 39.0 - 52.0 % 46.0   24.9   23.3    Platelets 150 - 400 K/uL 363   476   384        CMP     Component Value Date/Time   NA 134 (L) 09/01/2021 1255   K 3.4 (L) 09/01/2021 1255   CL 101 09/01/2021 1255   CO2 22 09/01/2021 1255   GLUCOSE 124 (H) 09/01/2021 1255   BUN 6 (L) 01/30/2022 0950   CREATININE 0.88 01/30/2022 0950   CALCIUM 9.1 09/01/2021 1255   PROT 7.8 09/01/2021 1255   ALBUMIN 3.7 09/01/2021 1255   AST 17 09/01/2021 1255   ALT 12 09/01/2021 1255   ALKPHOS 68 09/01/2021 1255   BILITOT 1.1 09/01/2021 1255   GFRNONAA >60 01/30/2022 0950   GFRAA >60 12/04/2019 0523     VAS [place] ABI WITH/WO TBI  Result Date: 03/03/2022  LOWER EXTREMITY DOPPLER STUDY Patient Name:  ZAMARION LONGEST  Date of Exam:   03/01/2022 Medical Rec #: [id number]         Accession #:    [id number] Date of Birth: 10/27/1960          Patient Gender: M Patient Age:   85 years Exam Location:  [facility] Procedure:      VAS [place] ABI WITH/WO TBI Referring Phys: Barbara Cower DEW --------------------------------------------------------------------------------  Indications: Peripheral artery disease, and Numbness Right Foot.  Vascular Interventions: 11/14/27: Bilateral CIA and EIA Stents.                          11/28/2019: Aorta, Bilateral CIA, Bilateral EIA and                         Bilateral SFA Stents with Right ATA Angioplasty.                         01/2022 rt thrombectomy and new stents sfa pop. Performing Technologist: Salvadore Farber RVT  Examination Guidelines: A complete evaluation includes at minimum, Doppler waveform signals and systolic blood pressure reading at the level of bilateral brachial, anterior tibial, and posterior tibial arteries, when vessel segments are accessible. Bilateral  testing is considered an integral part of a complete examination. Photoelectric Plethysmograph (PPG) waveforms and toe systolic pressure readings are included as required and additional duplex testing as needed. Limited examinations for reoccurring indications may be performed as noted.  ABI Findings: +---------+------------------+-----+--------+--------+ Right    Rt Pressure (mmHg)IndexWaveformComment  +---------+------------------+-----+--------+--------+ Brachial 92                                      +---------+------------------+-----+--------+--------+ ATA      141               1.02 biphasic         +---------+------------------+-----+--------+--------+ PTA      129               0.93 biphasic         +---------+------------------+-----+--------+--------+  Great Toe122               0.88 Normal           +---------+------------------+-----+--------+--------+ +---------+------------------+-----+--------+-------+ Left     Lt Pressure (mmHg)IndexWaveformComment +---------+------------------+-----+--------+-------+ Brachial 138                                    +---------+------------------+-----+--------+-------+ ATA      111               0.80                 +---------+------------------+-----+--------+-------+ PTA      100               0.72                 +---------+------------------+-----+--------+-------+ Great Toe90                0.65 Normal          +---------+------------------+-----+--------+-------+ +-------+-----------+-----------+------------+------------+ ABI/TBIToday's ABIToday's TBIPrevious ABIPrevious TBI +-------+-----------+-----------+------------+------------+ Right  1.02       .88        .72         .25          +-------+-----------+-----------+------------+------------+ Left   .80        .65        .83         .50          +-------+-----------+-----------+------------+------------+  Right ABIs and TBIs appear  increased compared to prior study on 12/21/2021.  Summary: Right: Resting right ankle-brachial index is within normal range. No evidence of significant right lower extremity arterial disease. The right toe-brachial index is normal. Evidence of right subclavian steal with Rt Brachial pressure = 88mm/Hg versus Lt brachial pressure = . Confirmed retrograde vertebral artery flow by duplex. Left: Resting left ankle-brachial index indicates mild left lower extremity arterial disease. The left toe-brachial index is abnormal. *See table(s) above for measurements and observations.  Electronically signed by Festus Barren MD on 03/03/2022 at 11:17:40 AM.    Final        Assessment & Plan:   1. Atherosclerosis of native artery of right lower extremity with rest pain [facility]) Recommend:  The patient is status post successful angiogram with intervention.  The patient reports that the claudication symptoms and leg pain has improved.   The patient denies lifestyle limiting changes at this point in time.  No further invasive studies, angiography or surgery at this time The patient should continue walking and begin a more formal exercise program.  The patient should continue antiplatelet therapy and aggressive treatment of the lipid abnormalities  Continued surveillance is indicated as atherosclerosis is likely to progress with time.    Patient should undergo noninvasive studies as ordered. The patient will follow up with me to review the studies.     It was incidentally noted the patient had normal blood pressures with the right being lower than the left.  We will evaluate for possible steal symptoms at follow-up visit. 2. Tobacco use disorder The patient has stopped.  Continue abstinence encouraged.   Current Outpatient Medications on File Prior to Visit  Medication Sig Dispense Refill   aspirin EC 81 MG tablet Take 1 tablet (81 mg total) by mouth daily. 150 tablet 2   gabapentin (NEURONTIN) 300 MG capsule  Take 1 capsule (300 mg total) by mouth  2 (two) times daily. 60 capsule 6   ondansetron (ZOFRAN ODT) 4 MG disintegrating tablet Take 1 tablet (4 mg total) by mouth every 8 (eight) hours as needed for nausea or vomiting. 20 tablet 0   atorvastatin (LIPITOR) 10 MG tablet Take 1 tablet (10 mg total) by mouth daily. 30 tablet 11   clopidogrel (PLAVIX) 75 MG tablet Take 1 tablet (75 mg total) by mouth daily. (Patient not taking: Reported on 03/01/2022) 30 tablet 11   No current facility-administered medications on file prior to visit.    There are no Patient Instructions on file for this visit. No follow-ups on file.   Georgiana Spinner, NP

## 2022-03-28 ENCOUNTER — Telehealth (INDEPENDENT_AMBULATORY_CARE_PROVIDER_SITE_OTHER): Payer: Self-pay | Admitting: Nurse Practitioner

## 2022-03-28 NOTE — Telephone Encounter (Signed)
LVM for patient to return call on 03/28/22

## 2022-04-06 ENCOUNTER — Encounter (INDEPENDENT_AMBULATORY_CARE_PROVIDER_SITE_OTHER): Payer: Self-pay | Admitting: Nurse Practitioner

## 2022-04-06 ENCOUNTER — Ambulatory Visit (INDEPENDENT_AMBULATORY_CARE_PROVIDER_SITE_OTHER): Payer: Self-pay

## 2022-04-06 ENCOUNTER — Ambulatory Visit (INDEPENDENT_AMBULATORY_CARE_PROVIDER_SITE_OTHER): Payer: Self-pay | Admitting: Nurse Practitioner

## 2022-04-06 ENCOUNTER — Other Ambulatory Visit (INDEPENDENT_AMBULATORY_CARE_PROVIDER_SITE_OTHER): Payer: Self-pay | Admitting: Vascular Surgery

## 2022-04-06 VITALS — BP 154/87 | HR 50 | Resp 16 | Wt 176.6 lb

## 2022-04-06 DIAGNOSIS — F17200 Nicotine dependence, unspecified, uncomplicated: Secondary | ICD-10-CM

## 2022-04-06 DIAGNOSIS — I70221 Atherosclerosis of native arteries of extremities with rest pain, right leg: Secondary | ICD-10-CM

## 2022-04-06 DIAGNOSIS — I70201 Unspecified atherosclerosis of native arteries of extremities, right leg: Secondary | ICD-10-CM

## 2022-04-18 ENCOUNTER — Encounter (INDEPENDENT_AMBULATORY_CARE_PROVIDER_SITE_OTHER): Payer: Self-pay | Admitting: Nurse Practitioner

## 2022-04-18 NOTE — Progress Notes (Signed)
Subjective:    Patient ID: Brendan Williams, male    DOB: [birth year], 61 y.o.   MRN: [id number] Chief Complaint  Patient presents with   Follow-up    Right foot pain and swelling    The patient is a 61 year old male who presents today for follow-up evaluation after having right lower extremity swelling.  The patient recently underwent revascularization of his right lower extremity.  He denies any numbing or pain in the lower extremity.  He just notes that when he is on his feet longer there is worsening swelling.  This is also uncomfortable for him.  He does elevate his lower extremity although it  Pressure regularly.  Today noninvasive studies show an ABI of 1.18 on the right and 1.05 on the left.  Patient has normal TBI's.  He has biphasic/monophasic tibial artery waveforms bilaterally with good toe waveforms bilaterally.    Review of Systems  Cardiovascular:  Positive for leg swelling.  All other systems reviewed and are negative.      Objective:   Physical Exam Vitals reviewed.  HENT:     Head: Normocephalic.  Cardiovascular:     Rate and Rhythm: Normal rate.     Pulses:          Dorsalis pedis pulses are 1+ on the right side and 1+ on the left side.       Posterior tibial pulses are 1+ on the right side and 1+ on the left side.  Pulmonary:     Effort: Pulmonary effort is normal.  Skin:    General: Skin is warm and dry.  Neurological:     Mental Status: He is alert and oriented to person, place, and time.  Psychiatric:        Mood and Affect: Mood normal.        Behavior: Behavior normal.        Thought Content: Thought content normal.        Judgment: Judgment normal.     BP (!) 154/87 (BP Location: Left Arm)   Pulse (!) 50   Resp 16   Wt 176 lb 9.6 oz (80.1 kg)   BMI 23.30 kg/m   Past Medical History:  Diagnosis Date   Peripheral arterial disease (HCC)     Social History   Socioeconomic History   Marital status: Married    Spouse name: Not on file    Number of children: Not on file   Years of education: Not on file   Highest education level: Not on file  Occupational History   Not on file  Tobacco Use   Smoking status: Every Day    Packs/day: 1.00    Types: Cigarettes   Smokeless tobacco: Never  Vaping Use   Vaping Use: Never used  Substance and Sexual Activity   Alcohol use: Yes    Alcohol/week: 5.0 standard drinks of alcohol    Types: 5 Cans of beer per week    Comment: 4-5 beers a week   Drug use: Yes    Frequency: 3.0 times per week    Types: Marijuana    Comment: 3-4 times a week   Sexual activity: Yes  Other Topics Concern   Not on file  Social History Narrative   Live in private residence with wife, Steward Drone.   Social Determinants of Health   Financial Resource Strain: Not on file  Food Insecurity: Not on file  Transportation Needs: Not on file  Physical Activity: Not on file  Stress: Not on file  Social Connections: Not on file  Intimate Partner Violence: Not on file    Past Surgical History:  Procedure Laterality Date   AMPUTATION TOE Right 12/02/2019   Procedure: AMPUTATION RIGHT 5TH TOE;  Surgeon: Rosetta Posner, DPM;  Location: [facility];  Service: Podiatry;  Laterality: Right;   APPLICATION OF WOUND VAC Bilateral 11/28/2019   Procedure: APPLICATION OF WOUND VAC;  Surgeon: Annice Needy, MD;  Location: [facility];  Service: Vascular;  Laterality: Bilateral;  Prevena    BACK SURGERY     lower ruptured disk   CENTRAL VENOUS CATHETER INSERTION  11/28/2019   Procedure: INSERTION CENTRAL LINE ADULT;  Surgeon: Annice Needy, MD;  Location: [facility];  Service: Vascular;;   ENDARTERECTOMY FEMORAL Bilateral 11/28/2019   Procedure: ENDARTERECTOMY FEMORAL;  Surgeon: Annice Needy, MD;  Location: [facility];  Service: Vascular;  Laterality: Bilateral;   LOWER EXTREMITY ANGIOGRAPHY Right 11/14/2019   Procedure: LOWER EXTREMITY ANGIOGRAPHY;  Surgeon: Annice Needy, MD;  Location: [facility];  Service: Cardiovascular;   Laterality: Right;   LOWER EXTREMITY ANGIOGRAPHY Right 12/19/2021   Procedure: Lower Extremity Angiography;  Surgeon: Annice Needy, MD;  Location: [facility];  Service: Cardiovascular;  Laterality: Right;   LOWER EXTREMITY ANGIOGRAPHY Right 01/30/2022   Procedure: Lower Extremity Angiography;  Surgeon: Annice Needy, MD;  Location: [facility];  Service: Cardiovascular;  Laterality: Right;   STOMACH SURGERY     TONSILECTOMY, ADENOIDECTOMY, BILATERAL MYRINGOTOMY AND TUBES      Family History  Problem Relation Age of Onset   Leukemia Mother    Heart disease Father        a. CABG   Heart disease Sister        a. MVR   Cancer Sister    Cancer Brother    CAD Brother        a. CABG    No Known Allergies     Latest Ref Rng & Units 09/01/2021   12:55 PM 12/04/2019    5:23 AM 12/03/2019   12:40 AM  CBC  WBC 4.0 - 10.5 K/uL 11.4  9.7  10.9   Hemoglobin 13.0 - 17.0 g/dL 51.0  8.2  7.6   Hematocrit 39.0 - 52.0 % 46.0  24.9  23.3   Platelets 150 - 400 K/uL 363  476  384       CMP     Component Value Date/Time   NA 134 (L) 09/01/2021 1255   K 3.4 (L) 09/01/2021 1255   CL 101 09/01/2021 1255   CO2 22 09/01/2021 1255   GLUCOSE 124 (H) 09/01/2021 1255   BUN 6 (L) 01/30/2022 0950   CREATININE 0.88 01/30/2022 0950   CALCIUM 9.1 09/01/2021 1255   PROT 7.8 09/01/2021 1255   ALBUMIN 3.7 09/01/2021 1255   AST 17 09/01/2021 1255   ALT 12 09/01/2021 1255   ALKPHOS 68 09/01/2021 1255   BILITOT 1.1 09/01/2021 1255   GFRNONAA >60 01/30/2022 0950   GFRAA >60 12/04/2019 0523     VAS [place] ABI WITH/WO TBI  Result Date: 04/12/2022  LOWER EXTREMITY DOPPLER STUDY Patient Name:  Brendan Williams  Date of Exam:   04/06/2022 Medical Rec #: [id number]         Accession #:    [id number] Date of Birth: 19-Oct-1960          Patient Gender: M Patient Age:   61 years Exam Location:  [facility] Procedure:      VAS [place] ABI WITH/WO TBI Referring Phys: JASON DEW  --------------------------------------------------------------------------------  Indications: Peripheral artery disease, and Numbness Right Foot.  Vascular Interventions: 11/14/27: Bilateral CIA and EIA Stents.                          11/28/2019: Aorta, Bilateral CIA, Bilateral EIA and                         Bilateral SFA Stents with Right ATA Angioplasty.                         01/2022 rt thrombectomy and new stents sfa pop. Comparison Study: 03/01/2022 Performing Technologist: Almira Coaster RVS  Examination Guidelines: A complete evaluation includes at minimum, Doppler waveform signals and systolic blood pressure reading at the level of bilateral brachial, anterior tibial, and posterior tibial arteries, when vessel segments are accessible. Bilateral testing is considered an integral part of a complete examination. Photoelectric Plethysmograph (PPG) waveforms and toe systolic pressure readings are included as required and additional duplex testing as needed. Limited examinations for reoccurring indications may be performed as noted.  ABI Findings: +---------+------------------+-----+----------+--------+ Right    Rt Pressure (mmHg)IndexWaveform  Comment  +---------+------------------+-----+----------+--------+ Brachial 102                                       +---------+------------------+-----+----------+--------+ ATA      162               1.17 monophasic         +---------+------------------+-----+----------+--------+ PTA      163               1.18 biphasic           +---------+------------------+-----+----------+--------+ Great Toe113               0.82 Normal             +---------+------------------+-----+----------+--------+ +---------+------------------+-----+----------+-------+ Left     Lt Pressure (mmHg)IndexWaveform  Comment +---------+------------------+-----+----------+-------+ Brachial 138                                       +---------+------------------+-----+----------+-------+ PTA      111               0.80 biphasic          +---------+------------------+-----+----------+-------+ PERO     107               1.05 monophasic        +---------+------------------+-----+----------+-------+ Great Toe115               0.83 Normal            +---------+------------------+-----+----------+-------+ +-------+-----------+-----------+------------+------------+ ABI/TBIToday's ABIToday's TBIPrevious ABIPrevious TBI +-------+-----------+-----------+------------+------------+ Right  1.18       .82        1.02        .88          +-------+-----------+-----------+------------+------------+ Left   1.05       .83        .80         .65          +-------+-----------+-----------+------------+------------+ Compared to prior study  on 03/01/2022. Left ABIs and TBIs appear increased compared to prior study on 03/01/2022.  Summary: Right: Resting right ankle-brachial index is within normal range. No evidence of significant right lower extremity arterial disease. The right toe-brachial index is normal. Left: Resting left ankle-brachial index is within normal range. No evidence of significant left lower extremity arterial disease. The left toe-brachial index is normal.  *See table(s) above for measurements and observations.  Electronically signed by Leotis Pain MD on 04/12/2022 at 2:37:17 PM.    Final    VAS [place] ABI WITH/WO TBI  Result Date: 03/03/2022  LOWER EXTREMITY DOPPLER STUDY Patient Name:  Brendan Williams  Date of Exam:   03/01/2022 Medical Rec #: [id number]         Accession #:    [id number] Date of Birth: 1961-05-16          Patient Gender: M Patient Age:   61 years Exam Location:  [facility] Procedure:      VAS [place] ABI WITH/WO TBI Referring Phys: Corene Cornea DEW --------------------------------------------------------------------------------  Indications: Peripheral artery disease, and Numbness Right Foot.   Vascular Interventions: 11/14/27: Bilateral CIA and EIA Stents.                          11/28/2019: Aorta, Bilateral CIA, Bilateral EIA and                         Bilateral SFA Stents with Right ATA Angioplasty.                         01/2022 rt thrombectomy and new stents sfa pop. Performing Technologist: Concha Norway RVT  Examination Guidelines: A complete evaluation includes at minimum, Doppler waveform signals and systolic blood pressure reading at the level of bilateral brachial, anterior tibial, and posterior tibial arteries, when vessel segments are accessible. Bilateral testing is considered an integral part of a complete examination. Photoelectric Plethysmograph (PPG) waveforms and toe systolic pressure readings are included as required and additional duplex testing as needed. Limited examinations for reoccurring indications may be performed as noted.  ABI Findings: +---------+------------------+-----+--------+--------+ Right    Rt Pressure (mmHg)IndexWaveformComment  +---------+------------------+-----+--------+--------+ Brachial 92                                      +---------+------------------+-----+--------+--------+ ATA      141               1.02 biphasic         +---------+------------------+-----+--------+--------+ PTA      129               0.93 biphasic         +---------+------------------+-----+--------+--------+ Great Toe122               0.88 Normal           +---------+------------------+-----+--------+--------+ +---------+------------------+-----+--------+-------+ Left     Lt Pressure (mmHg)IndexWaveformComment +---------+------------------+-----+--------+-------+ Brachial 138                                    +---------+------------------+-----+--------+-------+ ATA      111               0.80                 +---------+------------------+-----+--------+-------+  PTA      100               0.72                  +---------+------------------+-----+--------+-------+ Great Toe90                0.65 Normal          +---------+------------------+-----+--------+-------+ +-------+-----------+-----------+------------+------------+ ABI/TBIToday's ABIToday's TBIPrevious ABIPrevious TBI +-------+-----------+-----------+------------+------------+ Right  1.02       .88        .72         .25          +-------+-----------+-----------+------------+------------+ Left   .80        .65        .83         .50          +-------+-----------+-----------+------------+------------+  Right ABIs and TBIs appear increased compared to prior study on 12/21/2021.  Summary: Right: Resting right ankle-brachial index is within normal range. No evidence of significant right lower extremity arterial disease. The right toe-brachial index is normal. Evidence of right subclavian steal with Rt Brachial pressure = 107mm/Hg versus Lt brachial pressure = 170mmHg. Confirmed retrograde vertebral artery flow by duplex. Left: Resting left ankle-brachial index indicates mild left lower extremity arterial disease. The left toe-brachial index is abnormal. *See table(s) above for measurements and observations.  Electronically signed by Leotis Pain MD on 03/03/2022 at 11:17:40 AM.    Final        Assessment & Plan:   1. Atherosclerosis of native artery of right lower extremity with rest pain ([place])  Recommend:  The patient has evidence of atherosclerosis of the lower extremities with claudication.  The patient does not voice lifestyle limiting changes at this point in time.  Noninvasive studies do not suggest clinically significant change.  No invasive studies, angiography or surgery at this time The patient should continue walking and begin a more formal exercise program.  The patient should continue antiplatelet therapy and aggressive treatment of the lipid abnormalities  No changes in the patient's medications at this time  Continued  surveillance is indicated as atherosclerosis is likely to progress with time.    The patient will continue follow up with noninvasive studies as ordered.    2. Tobacco use disorder Abstinence is encouraged   Current Outpatient Medications on File Prior to Visit  Medication Sig Dispense Refill   aspirin EC 81 MG tablet Take 1 tablet (81 mg total) by mouth daily. 150 tablet 2   atorvastatin (LIPITOR) 10 MG tablet Take 1 tablet (10 mg total) by mouth daily. 30 tablet 11   gabapentin (NEURONTIN) 300 MG capsule Take 1 capsule (300 mg total) by mouth 2 (two) times daily. 60 capsule 6   ondansetron (ZOFRAN ODT) 4 MG disintegrating tablet Take 1 tablet (4 mg total) by mouth every 8 (eight) hours as needed for nausea or vomiting. 20 tablet 0   clopidogrel (PLAVIX) 75 MG tablet Take 1 tablet (75 mg total) by mouth daily. (Patient not taking: Reported on 03/01/2022) 30 tablet 11   No current facility-administered medications on file prior to visit.    There are no Patient Instructions on file for this visit. No follow-ups on file.   Kris Hartmann, NP

## 2022-06-01 ENCOUNTER — Ambulatory Visit (INDEPENDENT_AMBULATORY_CARE_PROVIDER_SITE_OTHER): Payer: MEDICAID | Admitting: Nurse Practitioner

## 2022-06-01 ENCOUNTER — Encounter (INDEPENDENT_AMBULATORY_CARE_PROVIDER_SITE_OTHER): Payer: MEDICAID

## 2022-07-25 ENCOUNTER — Ambulatory Visit (INDEPENDENT_AMBULATORY_CARE_PROVIDER_SITE_OTHER): Payer: MEDICAID

## 2022-07-25 ENCOUNTER — Ambulatory Visit (INDEPENDENT_AMBULATORY_CARE_PROVIDER_SITE_OTHER): Payer: Self-pay | Admitting: Vascular Surgery

## 2022-07-25 ENCOUNTER — Ambulatory Visit (INDEPENDENT_AMBULATORY_CARE_PROVIDER_SITE_OTHER): Payer: Self-pay

## 2022-07-25 ENCOUNTER — Encounter (INDEPENDENT_AMBULATORY_CARE_PROVIDER_SITE_OTHER): Payer: Self-pay | Admitting: Vascular Surgery

## 2022-07-25 ENCOUNTER — Other Ambulatory Visit (INDEPENDENT_AMBULATORY_CARE_PROVIDER_SITE_OTHER): Payer: Self-pay | Admitting: Nurse Practitioner

## 2022-07-25 VITALS — BP 170/104 | HR 56 | Resp 17 | Ht 73.0 in | Wt 179.0 lb

## 2022-07-25 DIAGNOSIS — I739 Peripheral vascular disease, unspecified: Secondary | ICD-10-CM

## 2022-07-25 DIAGNOSIS — F17200 Nicotine dependence, unspecified, uncomplicated: Secondary | ICD-10-CM

## 2022-07-25 DIAGNOSIS — I70213 Atherosclerosis of native arteries of extremities with intermittent claudication, bilateral legs: Secondary | ICD-10-CM

## 2022-07-25 DIAGNOSIS — Z9889 Other specified postprocedural states: Secondary | ICD-10-CM

## 2022-07-25 NOTE — Assessment & Plan Note (Signed)
His ABIs today are normal at 1.12 on the right with triphasic waveforms and mildly reduced at 0.90 on the left with monophasic waveforms.  Doing well.  Continue current medical regimen including antiplatelet and statin agent.  Recheck in 6 months.

## 2022-07-25 NOTE — Progress Notes (Signed)
MRN : [id number]  Brendan Williams is a 61 y.o. (03-06-1961) male who presents with chief complaint of No chief complaint on file. Marland Kitchen  History of Present Illness: Patient returns today in follow up of his PAD.  He has undergone extensive revascularization multiple times in the past most recently about 6 months ago.  He is currently doing well.  He is not really having disabling claudication, ischemic rest pain, or ulceration.  He has some mild claudication symptoms but these are not lifestyle limiting at current.  His ABIs today are normal at 1.12 on the right with triphasic waveforms and mildly reduced at 0.90 on the left with monophasic waveforms.  Current Outpatient Medications  Medication Sig Dispense Refill   aspirin EC 81 MG tablet Take 1 tablet (81 mg total) by mouth daily. 150 tablet 2   clopidogrel (PLAVIX) 75 MG tablet Take 1 tablet (75 mg total) by mouth daily. 30 tablet 11   gabapentin (NEURONTIN) 300 MG capsule Take 1 capsule (300 mg total) by mouth 2 (two) times daily. 60 capsule 6   ondansetron (ZOFRAN ODT) 4 MG disintegrating tablet Take 1 tablet (4 mg total) by mouth every 8 (eight) hours as needed for nausea or vomiting. 20 tablet 0   atorvastatin (LIPITOR) 10 MG tablet Take 1 tablet (10 mg total) by mouth daily. 30 tablet 11   No current facility-administered medications for this visit.    Past Medical History:  Diagnosis Date   Peripheral arterial disease [facility])     Past Surgical History:  Procedure Laterality Date   AMPUTATION TOE Right 12/02/2019   Procedure: AMPUTATION RIGHT 5TH TOE;  Surgeon: Rosetta Posner, DPM;  Location: [facility];  Service: Podiatry;  Laterality: Right;   APPLICATION OF WOUND VAC Bilateral 11/28/2019   Procedure: APPLICATION OF WOUND VAC;  Surgeon: Annice Needy, MD;  Location: [facility];  Service: Vascular;  Laterality: Bilateral;  Prevena    BACK SURGERY     lower ruptured disk   CENTRAL VENOUS CATHETER INSERTION  11/28/2019   Procedure:  INSERTION CENTRAL LINE ADULT;  Surgeon: Annice Needy, MD;  Location: [facility];  Service: Vascular;;   ENDARTERECTOMY FEMORAL Bilateral 11/28/2019   Procedure: ENDARTERECTOMY FEMORAL;  Surgeon: Annice Needy, MD;  Location: [facility];  Service: Vascular;  Laterality: Bilateral;   LOWER EXTREMITY ANGIOGRAPHY Right 11/14/2019   Procedure: LOWER EXTREMITY ANGIOGRAPHY;  Surgeon: Annice Needy, MD;  Location: [facility];  Service: Cardiovascular;  Laterality: Right;   LOWER EXTREMITY ANGIOGRAPHY Right 12/19/2021   Procedure: Lower Extremity Angiography;  Surgeon: Annice Needy, MD;  Location: [facility];  Service: Cardiovascular;  Laterality: Right;   LOWER EXTREMITY ANGIOGRAPHY Right 01/30/2022   Procedure: Lower Extremity Angiography;  Surgeon: Annice Needy, MD;  Location: [facility];  Service: Cardiovascular;  Laterality: Right;   STOMACH SURGERY     TONSILECTOMY, ADENOIDECTOMY, BILATERAL MYRINGOTOMY AND TUBES       Social History   Tobacco Use   Smoking status: Every Day    Packs/day: 1.00    Types: Cigarettes   Smokeless tobacco: Never  Vaping Use   Vaping Use: Never used  Substance Use Topics   Alcohol use: Yes    Alcohol/week: 5.0 standard drinks of alcohol    Types: 5 Cans of beer per week    Comment: 4-5 beers a week   Drug use: Yes    Frequency: 3.0 times per week  Types: Marijuana    Comment: 3-4 times a week      Family History  Problem Relation Age of Onset   Leukemia Mother    Heart disease Father        a. CABG   Heart disease Sister        a. MVR   Cancer Sister    Cancer Brother    CAD Brother        a. CABG    No Known Allergies   REVIEW OF SYSTEMS (Negative unless checked)  Constitutional: [] Weight loss  [] Fever  [] Chills Cardiac: [] Chest pain   [] Chest pressure   [] Palpitations   [] Shortness of breath when laying flat   [] Shortness of breath at rest   [] Shortness of breath with exertion. Vascular:  [x] Pain in legs with  walking   [] Pain in legs at rest   [] Pain in legs when laying flat   [x] Claudication   [] Pain in feet when walking  [] Pain in feet at rest  [] Pain in feet when laying flat   [] History of DVT   [] Phlebitis   [] Swelling in legs   [] Varicose veins   [] Non-healing ulcers Pulmonary:   [] Uses home oxygen   [] Productive cough   [] Hemoptysis   [] Wheeze  [] COPD   [] Asthma Neurologic:  [] Dizziness  [] Blackouts   [] Seizures   [] History of stroke   [] History of TIA  [] Aphasia   [] Temporary blindness   [] Dysphagia   [] Weakness or numbness in arms   [] Weakness or numbness in legs Musculoskeletal:  [] Arthritis   [] Joint swelling   [] Joint pain   [] Low back pain Hematologic:  [] Easy bruising  [] Easy bleeding   [] Hypercoagulable state   [] Anemic   Gastrointestinal:  [] Blood in stool   [] Vomiting blood  [] Gastroesophageal reflux/heartburn   [] Abdominal pain Genitourinary:  [] Chronic kidney disease   [] Difficult urination  [] Frequent urination  [] Burning with urination   [] Hematuria Skin:  [] Rashes   [] Ulcers   [] Wounds Psychological:  [] History of anxiety   []  History of major depression.   Physical Examination  BP (!) 170/104 (BP Location: Left Arm)   Pulse (!) 56   Resp 17   Ht 6\' 1"  (1.854 m)   Wt 179 lb (81.2 kg)   BMI 23.62 kg/m  Gen:  WD/WN, NAD Head: /AT, No temporalis wasting. Ear/Nose/Throat: Hearing grossly intact, nares w/o erythema or drainage Eyes: Conjunctiva clear. Sclera non-icteric Neck: Supple.  Trachea midline Pulmonary:  Good air movement, no use of accessory muscles.  Cardiac: RRR, no JVD Vascular:  Vessel Right Left  Radial Palpable Palpable                          PT 2+ Palpable 1+ Palpable  DP 2+ Palpable 1+ Palpable   Gastrointestinal: soft, non-tender/non-distended. No guarding/reflex.  Musculoskeletal: M/S 5/5 throughout.  No deformity or atrophy. Trace RLE edema. Neurologic: Sensation grossly intact in extremities.  Symmetrical.  Speech is fluent.  Psychiatric:  Judgment intact, Mood & affect appropriate for pt's clinical situation. Dermatologic: No rashes or ulcers noted.  No cellulitis or open wounds.      Labs No results found for this or any previous visit (from the past 2160 hour(s)).  Radiology No results found.  Assessment/Plan  Tobacco use disorder Has been unable to quit  Atherosclerosis of native arteries of extremity with intermittent claudication (HCC) His ABIs today are normal at 1.12 on the right with triphasic waveforms and mildly reduced at  0.90 on the left with monophasic waveforms.  Doing well.  Continue current medical regimen including antiplatelet and statin agent.  Recheck in 6 months.    Leotis Pain, MD  07/25/2022 11:15 AM    This note was created with Dragon medical transcription system.  Any errors from dictation are purely unintentional

## 2022-07-25 NOTE — Assessment & Plan Note (Signed)
Has been unable to quit

## 2022-08-14 ENCOUNTER — Encounter (INDEPENDENT_AMBULATORY_CARE_PROVIDER_SITE_OTHER): Payer: Self-pay

## 2023-01-10 ENCOUNTER — Other Ambulatory Visit (INDEPENDENT_AMBULATORY_CARE_PROVIDER_SITE_OTHER): Payer: Self-pay | Admitting: Vascular Surgery

## 2023-01-10 DIAGNOSIS — Z9889 Other specified postprocedural states: Secondary | ICD-10-CM

## 2023-01-10 DIAGNOSIS — I70213 Atherosclerosis of native arteries of extremities with intermittent claudication, bilateral legs: Secondary | ICD-10-CM

## 2023-01-23 ENCOUNTER — Ambulatory Visit (INDEPENDENT_AMBULATORY_CARE_PROVIDER_SITE_OTHER): Payer: MEDICAID | Admitting: Nurse Practitioner

## 2023-01-23 ENCOUNTER — Encounter (INDEPENDENT_AMBULATORY_CARE_PROVIDER_SITE_OTHER): Payer: MEDICAID

## 2023-01-23 ENCOUNTER — Encounter (INDEPENDENT_AMBULATORY_CARE_PROVIDER_SITE_OTHER): Payer: Self-pay

## 2023-04-30 ENCOUNTER — Telehealth (INDEPENDENT_AMBULATORY_CARE_PROVIDER_SITE_OTHER): Payer: Self-pay

## 2023-04-30 NOTE — Telephone Encounter (Signed)
Patient left a voicemail stating he may need an ultrasound but didn't say on what. I returned his call but no answer. I left a message to call back.

## 2023-05-01 ENCOUNTER — Telehealth (INDEPENDENT_AMBULATORY_CARE_PROVIDER_SITE_OTHER): Payer: Self-pay

## 2023-05-01 NOTE — Telephone Encounter (Signed)
Bring him in tomorrow with R Art duplex tomorrow

## 2023-05-02 ENCOUNTER — Encounter (INDEPENDENT_AMBULATORY_CARE_PROVIDER_SITE_OTHER): Payer: Self-pay | Admitting: Nurse Practitioner

## 2023-05-02 ENCOUNTER — Ambulatory Visit (INDEPENDENT_AMBULATORY_CARE_PROVIDER_SITE_OTHER): Payer: Medicaid Other

## 2023-05-02 ENCOUNTER — Ambulatory Visit (INDEPENDENT_AMBULATORY_CARE_PROVIDER_SITE_OTHER): Payer: Medicaid Other | Admitting: Nurse Practitioner

## 2023-05-02 VITALS — BP 106/76 | HR 98 | Resp 18 | Ht 73.0 in | Wt 172.6 lb

## 2023-05-02 DIAGNOSIS — I70221 Atherosclerosis of native arteries of extremities with rest pain, right leg: Secondary | ICD-10-CM

## 2023-05-02 DIAGNOSIS — I70213 Atherosclerosis of native arteries of extremities with intermittent claudication, bilateral legs: Secondary | ICD-10-CM | POA: Diagnosis not present

## 2023-05-02 DIAGNOSIS — F17200 Nicotine dependence, unspecified, uncomplicated: Secondary | ICD-10-CM | POA: Diagnosis not present

## 2023-05-02 MED ORDER — HYDROCODONE-ACETAMINOPHEN 5-325 MG PO TABS: 1.0000 | ORAL_TABLET | Freq: Four times a day (QID) | ORAL | 0 refills | Status: DC | PRN

## 2023-05-02 NOTE — Telephone Encounter (Signed)
Patient seen 05/02/2023

## 2023-05-02 NOTE — Progress Notes (Signed)
Subjective:    Patient ID: Brendan Williams, male    DOB: 1960/12/26, 62 y.o.   MRN: [id number] Chief Complaint  Patient presents with   Follow-up    Bring him in tomorrow with R Art duplex tomorrow    Brendan Williams is a 62 year old male with a history of multiple endovascular interventions in 2021 he underwent a bilateral femoral endarterectomy.  He has also had extensive bilateral common and external iliac artery stenting.  His most recent intervention was in April 2023 with thrombectomy and new stents of the right lower extremity in the SFA/popliteal region.  He notes that for about 2 weeks he has been having severe pain in his right lower extremity.  He is unable to walk without significant pain.  He also is beginning to describe severe rest pain like symptoms.  He denies any open wounds or ulcerations.  He endorses continuing to have full motor function of his lower extremity.  He does continue to smoke daily.  Today noninvasive studies show an occluded right SFA extending through the popliteal.  The patient has little hyperemic flow to the ankle anterior tibial and peroneal distal vessels.  Posterior tibial artery is also occluded.    Review of Systems  Musculoskeletal:  Positive for gait problem and myalgias.  All other systems reviewed and are negative.      Objective:   Physical Exam Vitals reviewed.  HENT:     Head: Normocephalic.  Cardiovascular:     Rate and Rhythm: Normal rate.     Pulses:          Dorsalis pedis pulses are detected w/ Doppler on the right side.       Posterior tibial pulses are 0 on the right side.  Pulmonary:     Effort: Pulmonary effort is normal.  Skin:    General: Skin is warm and dry.  Neurological:     Mental Status: He is alert and oriented to person, place, and time.  Psychiatric:        Mood and Affect: Mood normal.        Behavior: Behavior normal.        Thought Content: Thought content normal.        Judgment: Judgment normal.      BP 106/76 (BP Location: Left Arm)   Pulse 98   Resp 18   Ht 6\' 1"  (1.854 m)   Wt 172 lb 9.6 oz (78.3 kg)   BMI 22.77 kg/m   Past Medical History:  Diagnosis Date   Peripheral arterial disease (HCC)     Social History   Socioeconomic History   Marital status: Married    Spouse name: Not on file   Number of children: Not on file   Years of education: Not on file   Highest education level: Not on file  Occupational History   Not on file  Tobacco Use   Smoking status: Every Day    Current packs/day: 1.00    Types: Cigarettes   Smokeless tobacco: Never  Vaping Use   Vaping status: Never Used  Substance and Sexual Activity   Alcohol use: Yes    Alcohol/week: 5.0 standard drinks of alcohol    Types: 5 Cans of beer per week    Comment: 4-5 beers a week   Drug use: Yes    Frequency: 3.0 times per week    Types: Marijuana    Comment: 3-4 times a week   Sexual activity: Yes  Other  Topics Concern   Not on file  Social History Narrative   Live in private residence with wife, Brendan Williams.   Social Determinants of Health   Financial Resource Strain: Not on file  Food Insecurity: Not on file  Transportation Needs: Not on file  Physical Activity: Not on file  Stress: Not on file  Social Connections: Not on file  Intimate Partner Violence: Not on file    Past Surgical History:  Procedure Laterality Date   AMPUTATION TOE Right 12/02/2019   Procedure: AMPUTATION RIGHT 5TH TOE;  Surgeon: Rosetta Posner, DPM;  Location: [facility];  Service: Podiatry;  Laterality: Right;   APPLICATION OF WOUND VAC Bilateral 11/28/2019   Procedure: APPLICATION OF WOUND VAC;  Surgeon: Annice Needy, MD;  Location: [facility];  Service: Vascular;  Laterality: Bilateral;  Prevena    BACK SURGERY     lower ruptured disk   CENTRAL VENOUS CATHETER INSERTION  11/28/2019   Procedure: INSERTION CENTRAL LINE ADULT;  Surgeon: Annice Needy, MD;  Location: [facility];  Service: Vascular;;   ENDARTERECTOMY  FEMORAL Bilateral 11/28/2019   Procedure: ENDARTERECTOMY FEMORAL;  Surgeon: Annice Needy, MD;  Location: [facility];  Service: Vascular;  Laterality: Bilateral;   LOWER EXTREMITY ANGIOGRAPHY Right 11/14/2019   Procedure: LOWER EXTREMITY ANGIOGRAPHY;  Surgeon: Annice Needy, MD;  Location: [facility];  Service: Cardiovascular;  Laterality: Right;   LOWER EXTREMITY ANGIOGRAPHY Right 12/19/2021   Procedure: Lower Extremity Angiography;  Surgeon: Annice Needy, MD;  Location: [facility];  Service: Cardiovascular;  Laterality: Right;   LOWER EXTREMITY ANGIOGRAPHY Right 01/30/2022   Procedure: Lower Extremity Angiography;  Surgeon: Annice Needy, MD;  Location: [facility];  Service: Cardiovascular;  Laterality: Right;   STOMACH SURGERY     TONSILECTOMY, ADENOIDECTOMY, BILATERAL MYRINGOTOMY AND TUBES      Family History  Problem Relation Age of Onset   Leukemia Mother    Heart disease Father        a. CABG   Heart disease Sister        a. MVR   Cancer Sister    Cancer Brother    CAD Brother        a. CABG    No Known Allergies     Latest Ref Rng & Units 09/01/2021   12:55 PM 12/04/2019    5:23 AM 12/03/2019   12:40 AM  CBC  WBC 4.0 - 10.5 K/uL 11.4  9.7  10.9   Hemoglobin 13.0 - 17.0 g/dL 16.1  8.2  7.6   Hematocrit 39.0 - 52.0 % 46.0  24.9  23.3   Platelets 150 - 400 K/uL 363  476  384       CMP     Component Value Date/Time   NA 134 (L) 09/01/2021 1255   K 3.4 (L) 09/01/2021 1255   CL 101 09/01/2021 1255   CO2 22 09/01/2021 1255   GLUCOSE 124 (H) 09/01/2021 1255   BUN 6 (L) 01/30/2022 0950   CREATININE 0.88 01/30/2022 0950   CALCIUM 9.1 09/01/2021 1255   PROT 7.8 09/01/2021 1255   ALBUMIN 3.7 09/01/2021 1255   AST 17 09/01/2021 1255   ALT 12 09/01/2021 1255   ALKPHOS 68 09/01/2021 1255   BILITOT 1.1 09/01/2021 1255   GFRNONAA >60 01/30/2022 0950     No results found.     Assessment & Plan:   1. 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  This represents a limb threatening ischemia and places the patient at the risk for right limb loss.  Patient should undergo angiography of the right lower extremity with the hope for intervention for limb salvage.  The risks and benefits as well as the alternative therapies was discussed in detail with the patient.  All questions were answered.  Patient agrees to proceed with right lower extremity angiography.  Given the patient's timeframe of symptoms we tried to arrange the patient for next intervention however he requested it to be done next week.  Patient is advised that if his symptoms worsen such as he loses motor function ability, there is coolness of extremities or sudden pallor he should present to the emergency room.  The patient will follow up with me in the office after the procedure.      2. Tobacco use disorder Smoking cessation was discussed, 3-10 minutes spent on this topic specifically   Current Outpatient Medications on File Prior to Visit  Medication Sig Dispense Refill   aspirin EC 81 MG tablet Take 1 tablet (81 mg total) by mouth daily. 150 tablet 2   atorvastatin (LIPITOR) 10 MG tablet Take 1 tablet (10 mg total) by mouth daily. 30 tablet 11   gabapentin (NEURONTIN) 300 MG capsule Take 1 capsule (300 mg total) by mouth 2 (two) times daily. 60 capsule 6   ondansetron (ZOFRAN ODT) 4 MG disintegrating tablet Take 1 tablet (4 mg total) by mouth every 8 (eight) hours as needed for nausea or vomiting. 20 tablet 0   clopidogrel (PLAVIX) 75 MG tablet Take 1 tablet (75 mg total) by mouth daily. (Patient not taking: Reported on 05/02/2023) 30 tablet 11   No current facility-administered medications on file prior to visit.    There are no  Patient Instructions on file for this visit. No follow-ups on file.   Georgiana Spinner, NP

## 2023-05-10 ENCOUNTER — Telehealth (INDEPENDENT_AMBULATORY_CARE_PROVIDER_SITE_OTHER): Payer: Self-pay

## 2023-05-10 NOTE — Telephone Encounter (Signed)
Patients wife called stating Brendan Williams is in pain and he is waiting on his prior authorization for surgery. She would like to know if he could get more HYDROcodone-acetaminophen (NORCO/VICODIN) 5-325 until he can get a date for his surgery.   Please advise

## 2023-05-14 ENCOUNTER — Other Ambulatory Visit (INDEPENDENT_AMBULATORY_CARE_PROVIDER_SITE_OTHER): Payer: Self-pay | Admitting: Nurse Practitioner

## 2023-05-14 ENCOUNTER — Telehealth (INDEPENDENT_AMBULATORY_CARE_PROVIDER_SITE_OTHER): Payer: Self-pay

## 2023-05-14 MED ORDER — HYDROCODONE-ACETAMINOPHEN 5-325 MG PO TABS: 1.0000 | ORAL_TABLET | Freq: Four times a day (QID) | ORAL | 0 refills | Status: DC | PRN

## 2023-05-14 NOTE — Telephone Encounter (Signed)
sent 

## 2023-05-14 NOTE — Telephone Encounter (Signed)
Spoke with the patient's spouse and explained about the insurance authorization pending. The spouse was asking if I could ask Vivia Birmingham about the patient going to the ED because he was ins a lot a pain. I explained that Vivia Birmingham would want the patient to go and get evaluated. I spoke with Vivia Birmingham and she stated the patient needed to be evaluated.

## 2023-05-16 ENCOUNTER — Telehealth (INDEPENDENT_AMBULATORY_CARE_PROVIDER_SITE_OTHER): Payer: Self-pay

## 2023-05-16 NOTE — Telephone Encounter (Signed)
Spoke with the patient's spouse and he is scheduled with Dr. Wyn Quaker on 05/21/23 with a 12:00 pm arrival time to the [facility]. Pre-procedure instructions were discussed and be sent to Mychart and mailed.

## 2023-05-17 ENCOUNTER — Telehealth (INDEPENDENT_AMBULATORY_CARE_PROVIDER_SITE_OTHER): Payer: Self-pay

## 2023-05-17 ENCOUNTER — Other Ambulatory Visit (INDEPENDENT_AMBULATORY_CARE_PROVIDER_SITE_OTHER): Payer: Self-pay | Admitting: Nurse Practitioner

## 2023-05-17 MED ORDER — HYDROCODONE-ACETAMINOPHEN 5-325 MG PO TABS: 1.0000 | ORAL_TABLET | Freq: Four times a day (QID) | ORAL | 0 refills | Status: DC | PRN

## 2023-05-17 NOTE — Telephone Encounter (Signed)
Patient spouse left a message requesting pain medication to sent to [facility]

## 2023-05-17 NOTE — Telephone Encounter (Signed)
Refill sent.

## 2023-05-18 ENCOUNTER — Inpatient Hospital Stay
Admission: EM | Admit: 2023-05-18 | Discharge: 2023-05-29 | Disposition: A | Discharge status: Home / Self Care | Payer: Medicaid Other | Attending: Internal Medicine | Admitting: Internal Medicine

## 2023-05-18 ENCOUNTER — Inpatient Hospital Stay: Payer: Medicaid Other

## 2023-05-18 ENCOUNTER — Other Ambulatory Visit: Payer: Self-pay

## 2023-05-18 DIAGNOSIS — L02611 Cutaneous abscess of right foot: Secondary | ICD-10-CM | POA: Diagnosis present

## 2023-05-18 DIAGNOSIS — Z7902 Long term (current) use of antithrombotics/antiplatelets: Secondary | ICD-10-CM | POA: Diagnosis not present

## 2023-05-18 DIAGNOSIS — F17200 Nicotine dependence, unspecified, uncomplicated: Secondary | ICD-10-CM | POA: Diagnosis not present

## 2023-05-18 DIAGNOSIS — Z9889 Other specified postprocedural states: Secondary | ICD-10-CM | POA: Diagnosis not present

## 2023-05-18 DIAGNOSIS — I743 Embolism and thrombosis of arteries of the lower extremities: Secondary | ICD-10-CM | POA: Diagnosis not present

## 2023-05-18 DIAGNOSIS — L03031 Cellulitis of right toe: Secondary | ICD-10-CM | POA: Diagnosis not present

## 2023-05-18 DIAGNOSIS — I959 Hypotension, unspecified: Secondary | ICD-10-CM | POA: Diagnosis present

## 2023-05-18 DIAGNOSIS — I82441 Acute embolism and thrombosis of right tibial vein: Secondary | ICD-10-CM | POA: Diagnosis present

## 2023-05-18 DIAGNOSIS — T82858A Stenosis of vascular prosthetic devices, implants and grafts, initial encounter: Secondary | ICD-10-CM | POA: Diagnosis not present

## 2023-05-18 DIAGNOSIS — Z79899 Other long term (current) drug therapy: Secondary | ICD-10-CM

## 2023-05-18 DIAGNOSIS — G629 Polyneuropathy, unspecified: Secondary | ICD-10-CM | POA: Diagnosis present

## 2023-05-18 DIAGNOSIS — L03115 Cellulitis of right lower limb: Secondary | ICD-10-CM

## 2023-05-18 DIAGNOSIS — Z7982 Long term (current) use of aspirin: Secondary | ICD-10-CM | POA: Diagnosis not present

## 2023-05-18 DIAGNOSIS — E871 Hypo-osmolality and hyponatremia: Secondary | ICD-10-CM | POA: Diagnosis present

## 2023-05-18 DIAGNOSIS — T82856A Stenosis of peripheral vascular stent, initial encounter: Principal | ICD-10-CM | POA: Diagnosis present

## 2023-05-18 DIAGNOSIS — D649 Anemia, unspecified: Secondary | ICD-10-CM | POA: Diagnosis present

## 2023-05-18 DIAGNOSIS — I70229 Atherosclerosis of native arteries of extremities with rest pain, unspecified extremity: Secondary | ICD-10-CM

## 2023-05-18 DIAGNOSIS — Z806 Family history of leukemia: Secondary | ICD-10-CM | POA: Diagnosis not present

## 2023-05-18 DIAGNOSIS — F17210 Nicotine dependence, cigarettes, uncomplicated: Secondary | ICD-10-CM | POA: Diagnosis present

## 2023-05-18 DIAGNOSIS — I70221 Atherosclerosis of native arteries of extremities with rest pain, right leg: Secondary | ICD-10-CM | POA: Diagnosis not present

## 2023-05-18 DIAGNOSIS — Z89421 Acquired absence of other right toe(s): Secondary | ICD-10-CM

## 2023-05-18 DIAGNOSIS — I96 Gangrene, not elsewhere classified: Secondary | ICD-10-CM | POA: Diagnosis not present

## 2023-05-18 DIAGNOSIS — Y838 Other surgical procedures as the cause of abnormal reaction of the patient, or of later complication, without mention of misadventure at the time of the procedure: Secondary | ICD-10-CM | POA: Diagnosis present

## 2023-05-18 DIAGNOSIS — F419 Anxiety disorder, unspecified: Secondary | ICD-10-CM | POA: Diagnosis present

## 2023-05-18 DIAGNOSIS — Z7901 Long term (current) use of anticoagulants: Secondary | ICD-10-CM

## 2023-05-18 DIAGNOSIS — L0390 Cellulitis, unspecified: Secondary | ICD-10-CM | POA: Insufficient documentation

## 2023-05-18 DIAGNOSIS — A419 Sepsis, unspecified organism: Secondary | ICD-10-CM | POA: Insufficient documentation

## 2023-05-18 DIAGNOSIS — I70261 Atherosclerosis of native arteries of extremities with gangrene, right leg: Secondary | ICD-10-CM | POA: Diagnosis not present

## 2023-05-18 DIAGNOSIS — Z8249 Family history of ischemic heart disease and other diseases of the circulatory system: Secondary | ICD-10-CM | POA: Diagnosis not present

## 2023-05-18 LAB — COMPREHENSIVE METABOLIC PANEL
ALT: 11 U/L (ref 0–44)
AST: 16 U/L (ref 15–41)
Albumin: 3.7 g/dL (ref 3.5–5.0)
Alkaline Phosphatase: 67 U/L (ref 38–126)
Anion gap: 10 (ref 5–15)
BUN: 8 mg/dL (ref 8–23)
CO2: 27 mmol/L (ref 22–32)
Calcium: 9 mg/dL (ref 8.9–10.3)
Chloride: 94 mmol/L — ABNORMAL LOW (ref 98–111)
Creatinine, Ser: 0.83 mg/dL (ref 0.61–1.24)
GFR, Estimated: >60 mL/min (ref >60)
Glucose, Bld: 99 mg/dL (ref 70–99)
Potassium: 3.8 mmol/L (ref 3.5–5.1)
Sodium: 131 mmol/L — ABNORMAL LOW (ref 135–145)
Total Bilirubin: 0.7 mg/dL (ref 0.3–1.2)
Total Protein: 8 g/dL (ref 6.5–8.1)

## 2023-05-18 LAB — CBC
HCT: 43.7 % (ref 39.0–52.0)
Hemoglobin: 14.6 g/dL (ref 13.0–17.0)
MCH: 31.3 pg (ref 26.0–34.0)
MCHC: 33.4 g/dL (ref 30.0–36.0)
MCV: 93.6 fL (ref 80.0–100.0)
Platelets: 410 10*3/uL — ABNORMAL HIGH (ref 150–400)
RBC: 4.67 MIL/uL (ref 4.22–5.81)
RDW: 13.2 % (ref 11.5–15.5)
WBC: 13.6 10*3/uL — ABNORMAL HIGH (ref 4.0–10.5)
nRBC: 0 % (ref 0.0–0.2)

## 2023-05-18 LAB — PROTIME-INR
INR: 1.1 (ref 0.8–1.2)
Prothrombin Time: 14.2 seconds (ref 11.4–15.2)

## 2023-05-18 LAB — APTT: aPTT: 40 seconds — ABNORMAL HIGH (ref 24–36)

## 2023-05-18 LAB — HEPARIN LEVEL (UNFRACTIONATED): Heparin Unfractionated: <0.1 IU/mL — ABNORMAL LOW (ref 0.30–0.70)

## 2023-05-18 MED ORDER — ONDANSETRON HCL 4 MG/2ML IJ SOLN: 4.0000 mg | Freq: Four times a day (QID) | INTRAMUSCULAR | Status: DC | PRN

## 2023-05-18 MED ORDER — HEPARIN BOLUS VIA INFUSION
5400.0000 [IU] | Freq: Once | INTRAVENOUS | Status: AC
  Administered 2023-05-18: 5400 [IU] via INTRAVENOUS
  Filled 2023-05-18: qty 5400

## 2023-05-18 MED ORDER — HYDROMORPHONE HCL 1 MG/ML IJ SOLN
1.0000 mg | Freq: Once | INTRAMUSCULAR | Status: AC
  Administered 2023-05-18: 1 mg via INTRAVENOUS
  Filled 2023-05-18: qty 1

## 2023-05-18 MED ORDER — GADOBUTROL 1 MMOL/ML IV SOLN
7.5000 mL | Freq: Once | INTRAVENOUS | Status: AC | PRN
  Administered 2023-05-18: 7.5 mL via INTRAVENOUS

## 2023-05-18 MED ORDER — VANCOMYCIN HCL 1250 MG/250ML IV SOLN
1250.0000 mg | Freq: Two times a day (BID) | INTRAVENOUS | Status: AC
  Administered 2023-05-19 – 2023-05-27 (×17): 1250 mg via INTRAVENOUS
  Filled 2023-05-18 (×20): qty 250

## 2023-05-18 MED ORDER — SODIUM CHLORIDE 0.9 % IV SOLN
1.0000 g | INTRAVENOUS | Status: AC
  Administered 2023-05-19 – 2023-05-27 (×9): 1 g via INTRAVENOUS
  Filled 2023-05-18 (×9): qty 10

## 2023-05-18 MED ORDER — ACETAMINOPHEN 325 MG PO TABS
650.0000 mg | ORAL_TABLET | Freq: Four times a day (QID) | ORAL | Status: DC | PRN
  Administered 2023-05-18 – 2023-05-28 (×4): 650 mg via ORAL
  Filled 2023-05-18 (×4): qty 2

## 2023-05-18 MED ORDER — HYDROMORPHONE HCL 1 MG/ML IJ SOLN
0.5000 mg | INTRAMUSCULAR | Status: DC | PRN
  Administered 2023-05-20 – 2023-05-21 (×4): 1 mg via INTRAVENOUS
  Administered 2023-05-21: 0.5 mg via INTRAVENOUS
  Administered 2023-05-22 (×2): 1 mg via INTRAVENOUS
  Administered 2023-05-22: 0.5 mg via INTRAVENOUS
  Administered 2023-05-22 – 2023-05-23 (×3): 1 mg via INTRAVENOUS
  Filled 2023-05-18 (×12): qty 1

## 2023-05-18 MED ORDER — ONDANSETRON HCL 4 MG/2ML IJ SOLN
4.0000 mg | Freq: Four times a day (QID) | INTRAMUSCULAR | Status: DC | PRN
  Administered 2023-05-18: 4 mg via INTRAVENOUS
  Filled 2023-05-18: qty 2

## 2023-05-18 MED ORDER — VANCOMYCIN HCL IN DEXTROSE 1-5 GM/200ML-% IV SOLN: 1000.0000 mg | Freq: Once | INTRAVENOUS | Status: DC

## 2023-05-18 MED ORDER — OXYCODONE HCL 5 MG PO TABS
5.0000 mg | ORAL_TABLET | Freq: Four times a day (QID) | ORAL | Status: DC | PRN
  Administered 2023-05-18 – 2023-05-22 (×10): 5 mg via ORAL
  Filled 2023-05-18 (×10): qty 1

## 2023-05-18 MED ORDER — SODIUM CHLORIDE 0.9 % IV SOLN
2.0000 g | Freq: Once | INTRAVENOUS | Status: AC
  Administered 2023-05-18: 2 g via INTRAVENOUS
  Filled 2023-05-18: qty 20

## 2023-05-18 MED ORDER — POLYETHYLENE GLYCOL 3350 17 G PO PACK
17.0000 g | PACK | Freq: Every day | ORAL | Status: DC | PRN
  Administered 2023-05-19 – 2023-05-20 (×2): 17 g via ORAL
  Filled 2023-05-18 (×2): qty 1

## 2023-05-18 MED ORDER — HEPARIN (PORCINE) 25000 UT/250ML-% IV SOLN
2100.0000 [IU]/h | INTRAVENOUS | Status: DC
  Administered 2023-05-18 (×2): 1300 [IU]/h via INTRAVENOUS
  Administered 2023-05-19: 1850 [IU]/h via INTRAVENOUS
  Administered 2023-05-20: 2150 [IU]/h via INTRAVENOUS
  Administered 2023-05-20: 2000 [IU]/h via INTRAVENOUS
  Administered 2023-05-21 – 2023-05-23 (×4): 2150 [IU]/h via INTRAVENOUS
  Filled 2023-05-18 (×9): qty 250

## 2023-05-18 MED ORDER — SODIUM CHLORIDE 0.9 % IV SOLN: INTRAVENOUS | Status: AC

## 2023-05-18 MED ORDER — ONDANSETRON HCL 4 MG PO TABS: 4.0000 mg | ORAL_TABLET | Freq: Four times a day (QID) | ORAL | Status: DC | PRN

## 2023-05-18 MED ORDER — SODIUM CHLORIDE 0.9% FLUSH
3.0000 mL | Freq: Two times a day (BID) | INTRAVENOUS | Status: DC
  Administered 2023-05-18 – 2023-05-29 (×22): 3 mL via INTRAVENOUS

## 2023-05-18 MED ORDER — ATORVASTATIN CALCIUM 20 MG PO TABS
40.0000 mg | ORAL_TABLET | Freq: Every day | ORAL | Status: DC
  Administered 2023-05-18 – 2023-05-29 (×12): 40 mg via ORAL
  Filled 2023-05-18 (×12): qty 2

## 2023-05-18 MED ORDER — HEPARIN BOLUS VIA INFUSION
2300.0000 [IU] | Freq: Once | INTRAVENOUS | Status: AC
  Administered 2023-05-18: 2300 [IU] via INTRAVENOUS
  Filled 2023-05-18: qty 2300

## 2023-05-18 MED ORDER — ACETAMINOPHEN 650 MG RE SUPP: 650.0000 mg | Freq: Four times a day (QID) | RECTAL | Status: DC | PRN

## 2023-05-18 MED ORDER — VANCOMYCIN HCL 1750 MG/350ML IV SOLN
1750.0000 mg | Freq: Once | INTRAVENOUS | Status: AC
  Administered 2023-05-18: 1750 mg via INTRAVENOUS
  Filled 2023-05-18: qty 350

## 2023-05-18 NOTE — Assessment & Plan Note (Addendum)
In the setting of ischemic limb.  Given rapidly progressive nature with streaking going up towards his knee, will cover broadly  - Continue ceftriaxone and vancomycin - Blood cultures ordered in the ED - Tylenol as needed for fever

## 2023-05-18 NOTE — H&P (Addendum)
History and Physical    Patient: Brendan Williams:[id number] DOB: [birth year] DOA: 05/18/2023 DOS: the patient was seen and examined on 05/18/2023 PCP: Gildardo Pounds, PA  Patient coming from: Home  Chief Complaint:  Chief Complaint  Patient presents with   Foot Pain   HPI: Brendan Williams is a 62 y.o. male with medical history significant of severe PAD s/p stenting (Most recent April 2023), tobacco use disorder, neuropathy, who presents to the ED due to right foot pain.  Mr. Stefanko states that he has been experiencing pain in his right foot since he most recently saw his vascular surgeon, however over the last 3 to 4 days, the pain has become excruciating.  In addition, the top of his foot has turned red and his toes have turned purple.  Due to the severity of the pain, he decided to come to the ER today.  He is also endorsing fever at home for the last 1 to 2 days but otherwise denies any chest pain, shortness of breath, palpitations, nausea, vomiting, diarrhea, abdominal pain.  No symptoms with his left leg.  ED Course: On arrival to the ED, patient was normotensive at 115/75 with heart rate of 63.  He was saturating at 98% on room air.  He was afebrile 99.1.  Initial workup notable for WBC 13.6, hemoglobin 14.6, platelets of 410, sodium of 131, creatinine 0.83 with GFR above 60.  Vascular surgery was consulted with plans for the OR.  Patient started on ceftriaxone, vancomycin and heparin.  [facility] contacted for admission.  Review of Systems: As mentioned in the history of present illness. All other systems reviewed and are negative.  Past Medical History:  Diagnosis Date   Peripheral arterial disease [facility])    Past Surgical History:  Procedure Laterality Date   AMPUTATION TOE Right 12/02/2019   Procedure: AMPUTATION RIGHT 5TH TOE;  Surgeon: Rosetta Posner, DPM;  Location: [facility];  Service: Podiatry;  Laterality: Right;   APPLICATION OF WOUND VAC Bilateral 11/28/2019   Procedure:  APPLICATION OF WOUND VAC;  Surgeon: Annice Needy, MD;  Location: [facility];  Service: Vascular;  Laterality: Bilateral;  Prevena    BACK SURGERY     lower ruptured disk   CENTRAL VENOUS CATHETER INSERTION  11/28/2019   Procedure: INSERTION CENTRAL LINE ADULT;  Surgeon: Annice Needy, MD;  Location: [facility];  Service: Vascular;;   ENDARTERECTOMY FEMORAL Bilateral 11/28/2019   Procedure: ENDARTERECTOMY FEMORAL;  Surgeon: Annice Needy, MD;  Location: [facility];  Service: Vascular;  Laterality: Bilateral;   LOWER EXTREMITY ANGIOGRAPHY Right 11/14/2019   Procedure: LOWER EXTREMITY ANGIOGRAPHY;  Surgeon: Annice Needy, MD;  Location: [facility];  Service: Cardiovascular;  Laterality: Right;   LOWER EXTREMITY ANGIOGRAPHY Right 12/19/2021   Procedure: Lower Extremity Angiography;  Surgeon: Annice Needy, MD;  Location: [facility];  Service: Cardiovascular;  Laterality: Right;   LOWER EXTREMITY ANGIOGRAPHY Right 01/30/2022   Procedure: Lower Extremity Angiography;  Surgeon: Annice Needy, MD;  Location: [facility];  Service: Cardiovascular;  Laterality: Right;   STOMACH SURGERY     TONSILECTOMY, ADENOIDECTOMY, BILATERAL MYRINGOTOMY AND TUBES     Social History:  reports that he has been smoking cigarettes. He has never used smokeless tobacco. He reports current alcohol use of about 5.0 standard drinks of alcohol per week. He reports current drug use. Frequency: 3.00 times per week. Drug: Marijuana.  Not on File  Family History  Problem  Relation Age of Onset   Leukemia Mother    Heart disease Father        a. CABG   Heart disease Sister        a. MVR   Cancer Sister    Cancer Brother    CAD Brother        a. CABG    Prior to Admission medications   Medication Sig Start Date End Date Taking? Authorizing Provider  aspirin EC 81 MG tablet Take 1 tablet (81 mg total) by mouth daily. 11/14/19   Annice Needy, MD  atorvastatin (LIPITOR) 10 MG tablet Take 1 tablet (10 mg total) by  mouth daily. 11/14/19 05/01/24  Annice Needy, MD  clopidogrel (PLAVIX) 75 MG tablet Take 1 tablet (75 mg total) by mouth daily. Patient not taking: Reported on 05/02/2023 11/14/19   Annice Needy, MD  gabapentin (NEURONTIN) 300 MG capsule Take 1 capsule (300 mg total) by mouth 2 (two) times daily. 08/11/20   Georgiana Spinner, NP  HYDROcodone-acetaminophen (NORCO/VICODIN) 5-325 MG tablet Take 1 tablet by mouth every 6 (six) hours as needed for moderate pain. 05/17/23 05/16/24  Georgiana Spinner, NP  ondansetron (ZOFRAN ODT) 4 MG disintegrating tablet Take 1 tablet (4 mg total) by mouth every 8 (eight) hours as needed for nausea or vomiting. 09/01/21   Minna Antis, MD    Physical Exam: Vitals:   05/18/23 1247 05/18/23 1251  BP: 115/75   Pulse: 63   Resp: 16   Temp: 99.1 F (37.3 C)   TempSrc: Oral   SpO2: 98%   Weight:  78.2 kg  Height:  6\' 1"  (1.854 m)   Physical Exam Vitals and nursing note reviewed.  Constitutional:      Appearance: He is normal weight.  HENT:     Head: Normocephalic and atraumatic.     Mouth/Throat:     Mouth: Mucous membranes are moist.     Pharynx: Oropharynx is clear.  Eyes:     Conjunctiva/sclera: Conjunctivae normal.     Pupils: Pupils are equal, round, and reactive to light.  Cardiovascular:     Rate and Rhythm: Normal rate and regular rhythm.     Heart sounds: No murmur heard. Pulmonary:     Effort: Pulmonary effort is normal. No respiratory distress.     Breath sounds: Normal breath sounds. No wheezing, rhonchi or rales.  Abdominal:     General: Bowel sounds are normal. There is no distension.     Palpations: Abdomen is soft.     Tenderness: There is no abdominal tenderness. There is no guarding.  Musculoskeletal:     Right lower leg: No edema.     Left lower leg: No edema.  Skin:    General: Skin is warm and dry.     Comments: Patient's second, third and fourth toes are a medium purple, especially on the plantar aspect with pain only elicited  with deep palpation.  Patient's fourth digit toenail is curved around ingrowing into the plantar aspect of the toe.  On the dorsal aspect of the right foot, there is significant erythema and edema with warmth to touch.  There is streaking, particularly on the posterior lateral aspect, up to the mid calf  Neurological:     Mental Status: He is alert and oriented to person, place, and time.  Psychiatric:        Mood and Affect: Mood normal.        Behavior: Behavior normal.  Data Reviewed: CBC with WBC of 13.6, hemoglobin 14.6, platelets of 410 CMP with sodium of 131, potassium 3.8, bicarb 27, glucose 99, BUN 8, creatinine 0.83, AST 16, ALT 11 and GFR above 60  Arterial duplex obtained on 7/17 with total occlusion of the superficial femoral artery and popliteal artery.  There are no new results to review at this time.  Assessment and Plan:  * Critical limb ischemia of right lower extremity (HCC) Patient has a history of severe peripheral artery disease with last follow-up on 7/17 when he was recommended to have intervention immediately, however patient preferred to wait.  Now presenting with worsening right leg pain and evidence of cellulitis.  - Vascular surgery consulted; appreciate their recommendations - Heparin infusion per pharmacy dosing - N.p.o. for possible intervention today - Oxycodone and Dilaudid for pain control - Telemetry monitoring - Frequent neurovascular checks  Cellulitis In the setting of ischemic limb.  Given rapidly progressive nature with streaking going up towards his knee, will cover broadly  - Continue ceftriaxone and vancomycin - Blood cultures ordered in the ED - Tylenol as needed for fever  Tobacco use disorder Discussed with patient that he will continued to risk his limbs as long as he continues to smoke.  He is motivated to trying to quit.  Will hold off on any nicotine patches to avoid further vasoconstriction.  Advance Care Planning:   Code  Status: Full Code verified by patient with wife at bedside  Consults: Vascular surgery  Family Communication: Patient's wife updated at bedside  Severity of Illness: The appropriate patient status for this patient is INPATIENT. Inpatient status is judged to be reasonable and necessary in order to provide the required intensity of service to ensure the patient's safety. The patient's presenting symptoms, physical exam findings, and initial radiographic and laboratory data in the context of their chronic comorbidities is felt to place them at high risk for further clinical deterioration. Furthermore, it is not anticipated that the patient will be medically stable for discharge from the hospital within 2 midnights of admission.   * I certify that at the point of admission it is my clinical judgment that the patient will require inpatient hospital care spanning beyond 2 midnights from the point of admission due to high intensity of service, high risk for further deterioration and high frequency of surveillance required.*  Author: Verdene Lennert, MD 05/18/2023 3:06 PM  For on call review [email or web address].

## 2023-05-18 NOTE — Assessment & Plan Note (Addendum)
Patient has a history of severe peripheral artery disease with last follow-up on 7/17 when he was recommended to have intervention immediately, however patient preferred to wait.  Now presenting with worsening right leg pain and evidence of cellulitis.  - Vascular surgery consulted; appreciate their recommendations - Heparin infusion per pharmacy dosing - N.p.o. for possible intervention today - Oxycodone and Dilaudid for pain control - Telemetry monitoring - Frequent neurovascular checks

## 2023-05-18 NOTE — ED Notes (Addendum)
Pt states that they have been having right foot pain for several weeks and is supposed to have foot surgery on Monday (potentially for a stent or to get something cleaned out) but couldn't wait that long. Right foot is visibly swollen and red, warmer to the touch and tender to the touch. Pt states that there are 2 new sores on the instep of their foot and the toes are dark purple and blanche when touched.

## 2023-05-18 NOTE — Consult Note (Signed)
Pharmacy Consult Note - Anticoagulation  Pharmacy Consult for heparin Indication: DVT  PATIENT MEASUREMENTS: Height: 6\' 1"  (185.4 cm) Weight: 78.2 kg (172 lb 6.4 oz) IBW/kg (Calculated) : 79.9 HEPARIN DW (KG): 78.2  VITAL SIGNS: Temp: 99.1 F (37.3 C) (08/02 1247) Temp Source: Oral (08/02 1247) BP: 115/75 (08/02 1247) Pulse Rate: 63 (08/02 1247)  Recent Labs    05/18/23 1253  HGB 14.6  HCT 43.7  PLT 410*  CREATININE 0.83    Estimated Creatinine Clearance: 102.1 mL/min (by C-G formula based on SCr of 0.83 mg/dL).  PAST MEDICAL HISTORY: Past Medical History:  Diagnosis Date   Peripheral arterial disease [facility])     ASSESSMENT: 63 y.o. male with PMH PAD s/p left toe amputation, ischemic pain, neuropathy is presenting with right foot pain and swelling, reportedly turning black, concerning for limb ischemia and/or potential VTE. Patient had procedure scheduled with vascular for 8/5 but was urged to come to ED today due to foot reportedly turning black. Patient is not on chronic anticoagulation per chart review.  Pharmacy has been consulted to initiate and manage heparin intravenous infusion.  Pertinent medications: bASA daily No chronic anticoag PTA per chart review  Goal(s) of therapy: Heparin level 0.3 - 0.7 units/mL Monitor platelets by anticoagulation protocol: Yes   Baseline anticoagulation labs: Recent Labs    05/18/23 1253  HGB 14.6  PLT 410*    Date Time aPTT/HL Rate/Comment    PLAN: Give 5400 units bolus x1; then start heparin infusion at 1300 units/hour. Check heparin level in 6 hours, then daily once at least two levels are consecutively therapeutic. Monitor CBC daily while on heparin infusion.   Will M. Dareen Piano, PharmD Clinical Pharmacist 05/18/2023 2:28 PM

## 2023-05-18 NOTE — ED Provider Notes (Signed)
[facility] Provider Note   Event Date/Time   First MD Initiated Contact with Patient 05/18/23 1316     (approximate) History  Foot Pain  HPI Brendan Williams is a 62 y.o. male with noted past medical history of recent diagnosis on 7/17 with critical limb ischemia to the right lower extremity who presents complaining of worsening pain and redness to the dorsum of this foot.  Patient states that he has known that his foot has needed surgery for the last 2 weeks but has not come to the hospital due to fear that his foot would need to be amputated.  Patient states that over the last 2 days he has had worsening 8/10, burning, aching pain to the right foot with associated skin change of redness to the top of his foot that has been streaking up his right ankle ROS: Patient currently denies any vision changes, tinnitus, difficulty speaking, facial droop, sore throat, chest pain, shortness of breath, abdominal pain, nausea/vomiting/diarrhea, dysuria, or weakness/numbness/paresthesias in any extremity   Physical Exam  Triage Vital Signs: ED Triage Vitals  Encounter Vitals Group     BP 05/18/23 1247 115/75     Systolic BP Percentile --      Diastolic BP Percentile --      Pulse Rate 05/18/23 1247 63     Resp 05/18/23 1247 16     Temp 05/18/23 1247 99.1 F (37.3 C)     Temp Source 05/18/23 1247 Oral     SpO2 05/18/23 1247 98 %     Weight 05/18/23 1251 172 lb 6.4 oz (78.2 kg)     Height 05/18/23 1251 6\' 1"  (1.854 m)     Head Circumference --      Peak Flow --      Pain Score 05/18/23 1251 8     Pain Loc --      Pain Education --      Exclude from Growth Chart --    Most recent vital signs: Vitals:   05/18/23 1247  BP: 115/75  Pulse: 63  Resp: 16  Temp: 99.1 F (37.3 C)  SpO2: 98%   General: Awake, oriented x4. CV:  Good peripheral perfusion.  Resp:  Normal effort.  Abd:  No distention.  Other:  Middle-aged well-developed, well-nourished Caucasian male  laying in bed in no acute distress.  The right lower extremity shows ecchymosis over the entire dorsum of the right foot with streaking erythema up the anterior right ankle.  There is also dusky discoloration about the right foot along the plantar aspect ED Results / Procedures / Treatments  Labs (all labs ordered are listed, but only abnormal results are displayed) Labs Reviewed  CBC - Abnormal; Notable for the following components:      Result Value   WBC 13.6 (*)    Platelets 410 (*)    All other components within normal limits  COMPREHENSIVE METABOLIC PANEL - Abnormal; Notable for the following components:   Sodium 131 (*)    Chloride 94 (*)    All other components within normal limits  CULTURE, BLOOD (SINGLE)  PROTIME-INR  APTT  HEPARIN LEVEL (UNFRACTIONATED)   PROCEDURES: Critical Care performed: Yes, see critical care procedure note(s) .1-3 Lead EKG Interpretation  Performed by: Merwyn Katos, MD Authorized by: Merwyn Katos, MD     Interpretation: normal     ECG rate:  71   ECG rate assessment: normal     Rhythm: sinus rhythm  Ectopy: none     Conduction: normal   CRITICAL CARE Performed by: Merwyn Katos  Total critical care time: 33 minutes  Critical care time was exclusive of separately billable procedures and treating other patients.  Critical care was necessary to treat or prevent imminent or life-threatening deterioration.  Critical care was time spent personally by me on the following activities: development of treatment plan with patient and/or surrogate as well as nursing, discussions with consultants, evaluation of patient's response to treatment, examination of patient, obtaining history from patient or surrogate, ordering and performing treatments and interventions, ordering and review of laboratory studies, ordering and review of radiographic studies, pulse oximetry and re-evaluation of patient's condition.  MEDICATIONS ORDERED IN  ED: Medications  0.9 %  sodium chloride infusion ( Intravenous New Bag/Given 05/18/23 1509)  vancomycin (VANCOREADY) IVPB 1750 mg/350 mL (has no administration in time range)  heparin ADULT infusion 100 units/mL (25000 units/243mL) (1,300 Units/hr Intravenous New Bag/Given 05/18/23 1532)  sodium chloride flush (NS) 0.9 % injection 3 mL (3 mLs Intravenous Given 05/18/23 1514)  acetaminophen (TYLENOL) tablet 650 mg (has no administration in time range)    Or  acetaminophen (TYLENOL) suppository 650 mg (has no administration in time range)  oxyCODONE (Oxy IR/ROXICODONE) immediate release tablet 5 mg (has no administration in time range)  HYDROmorphone (DILAUDID) injection 0.5-1 mg (has no administration in time range)  polyethylene glycol (MIRALAX / GLYCOLAX) packet 17 g (has no administration in time range)  ondansetron (ZOFRAN) tablet 4 mg (has no administration in time range)    Or  ondansetron (ZOFRAN) injection 4 mg (has no administration in time range)  cefTRIAXone (ROCEPHIN) 1 g in sodium chloride 0.9 % 100 mL IVPB (has no administration in time range)  vancomycin (VANCOREADY) IVPB 1250 mg/250 mL (has no administration in time range)  cefTRIAXone (ROCEPHIN) 2 g in sodium chloride 0.9 % 100 mL IVPB (2 g Intravenous New Bag/Given 05/18/23 1514)  HYDROmorphone (DILAUDID) injection 1 mg (1 mg Intravenous Given 05/18/23 1455)  heparin bolus via infusion 5,400 Units (5,400 Units Intravenous Bolus from Bag 05/18/23 1532)   IMPRESSION / MDM / ASSESSMENT AND PLAN / ED COURSE  I reviewed the triage vital signs and the nursing notes.                             The patient is on the cardiac monitor to evaluate for evidence of arrhythmia and/or significant heart rate changes. Patient's presentation is most consistent with acute presentation with potential threat to life or bodily function. Patient is a 62 year old male with the above-stated past medical history who presents with signs and symptoms of critical  limb ischemia to the right foot with associated cellulitis.  Patient started on empiric antibiotics and heparin.  I spoke to Dr. Gilda Crease in vascular surgery who agrees to assess patient and likely need for surgery.  Patient shows no signs of sepsis at this point.  Patient stable throughout emergency department course and will require admission to the internal medicine service for further evaluation and management  Dispo: Admit to medicine   FINAL CLINICAL IMPRESSION(S) / ED DIAGNOSES   Final diagnoses:  Critical limb ischemia of right lower extremity (HCC)  Cellulitis and abscess of toe of right foot   Rx / DC Orders   ED Discharge Orders     None      Note:  This document was prepared using Dragon voice recognition software and  may include unintentional dictation errors.   Merwyn Katos, MD 05/18/23 [phone]

## 2023-05-18 NOTE — Consult Note (Signed)
Pharmacy Antibiotic Note  ASSESSMENT: 62 y.o. male with PMH PAD s/p right toe amputation, ischemic pain, neuropathy  is presenting with  VTE and limb ischemia . Pharmacy has been consulted to manage vancomycin dosing.  Patient measurements: Height: 6\' 1"  (185.4 cm) Weight: 78.2 kg (172 lb 6.4 oz) IBW/kg (Calculated) : 79.9  Vital signs: Temp: 99.1 F (37.3 C) (08/02 1247) Temp Source: Oral (08/02 1247) BP: 115/75 (08/02 1247) Pulse Rate: 63 (08/02 1247) Recent Labs  Lab 05/18/23 1253  WBC 13.6*  CREATININE 0.83   Estimated Creatinine Clearance: 102.1 mL/min (by C-G formula based on SCr of 0.83 mg/dL).  Allergies: Not on File  Antimicrobials this admission: Ceftriaxone 8/2 >>  Vancomycin 8/2 >>  Dose adjustments this admission: N/A  Microbiology results: 8/2 BCx: sent  Vancomycin 1250 mg IV q12H Goal AUC 400-550  Est AUC: 498; Cmax: 34; Cmin: 13 SCr 0.83; TBW; Vd 0.72   PLAN: Patient received vancomycin 1500 mg IV x 1 in the ED as a loading dose Initiate vancomycin 1250 mg IV q12H Follow up culture results to assess for antibiotic optimization. Monitor renal function to assess for any necessary antibiotic dosing changes.   Thank you for allowing pharmacy to be a part of this patient's care.  Will M. Dareen Piano, PharmD Clinical Pharmacist 05/18/2023 3:56 PM

## 2023-05-18 NOTE — Assessment & Plan Note (Signed)
Discussed with patient that he will continued to risk his limbs as long as he continues to smoke.  He is motivated to trying to quit.  Will hold off on any nicotine patches to avoid further vasoconstriction.

## 2023-05-18 NOTE — ED Triage Notes (Signed)
Pt is having right sided foot pain and reports that he was supposed to have surgery a few weeks ago due to lack of blood flow to the right foot, states that he has 33 stents in his legs, pt states that the pain has gotten worse and thinks it may be infected now, states the delay was an insurance issue with prior authorization

## 2023-05-18 NOTE — Consult Note (Signed)
Hospital Consult    Reason for Consult:  Right Lower extremity Ischemia Requesting Physician:  Dr Imagene Gurney MD MRN #:  [id number]  History of Present Illness: This is a 62 y.o. male with medical history significant of severe PAD s/p stenting (Most recent April 2023), tobacco use disorder, neuropathy, who presents to the ED due to right foot pain. Patient well known to Dr Festus Barren. 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   On exam in the emergency department this afternoon the patient is somewhat uncomfortable due to the pain to his right lower extremity.  Pictures were taken of his foot and placed in the media files.  Patient noted to have weak but Doppler pulses both 2 posttibial and dorsalis pedis.  Patient noted to have +2 to +3 swelling of his foot due to cellulitis.  Patient's toes are cold to touch and slightly mottled purple.  Patient was scheduled for an angiogram this coming Monday on 05/21/2023 with Dr. Festus Barren MD but felt it would be better if he came to the emergency department today as his pain and symptoms worsened.  Past Medical History:  Diagnosis Date   Peripheral arterial disease [facility])     Past Surgical History:  Procedure Laterality Date   AMPUTATION TOE Right 12/02/2019   Procedure: AMPUTATION RIGHT 5TH TOE;  Surgeon: Rosetta Posner, DPM;  Location: [facility] ORS;  Service: Podiatry;  Laterality: Right;   APPLICATION OF WOUND VAC Bilateral 11/28/2019   Procedure: APPLICATION OF WOUND VAC;  Surgeon: Annice Needy, MD;  Location: [facility] ORS;  Service: Vascular;  Laterality: Bilateral;  Prevena    BACK SURGERY     lower ruptured disk   CENTRAL VENOUS CATHETER INSERTION  11/28/2019   Procedure: INSERTION CENTRAL LINE ADULT;  Surgeon: Annice Needy, MD;  Location: [facility] ORS;  Service: Vascular;;   ENDARTERECTOMY FEMORAL Bilateral 11/28/2019   Procedure: ENDARTERECTOMY FEMORAL;  Surgeon: Annice Needy, MD;  Location: [facility] ORS;  Service: Vascular;  Laterality:  Bilateral;   LOWER EXTREMITY ANGIOGRAPHY Right 11/14/2019   Procedure: LOWER EXTREMITY ANGIOGRAPHY;  Surgeon: Annice Needy, MD;  Location: [facility] INVASIVE CV LAB;  Service: Cardiovascular;  Laterality: Right;   LOWER EXTREMITY ANGIOGRAPHY Right 12/19/2021   Procedure: Lower Extremity Angiography;  Surgeon: Annice Needy, MD;  Location: [facility] INVASIVE CV LAB;  Service: Cardiovascular;  Laterality: Right;   LOWER EXTREMITY ANGIOGRAPHY Right 01/30/2022   Procedure: Lower Extremity Angiography;  Surgeon: Annice Needy, MD;  Location: [facility] INVASIVE CV LAB;  Service: Cardiovascular;  Laterality: Right;   STOMACH SURGERY     TONSILECTOMY, ADENOIDECTOMY, BILATERAL MYRINGOTOMY AND TUBES      Not on File  Prior to Admission medications   Medication Sig Start Date End Date Taking? Authorizing Provider  aspirin EC 81 MG tablet Take 1 tablet (81 mg total) by mouth daily. 11/14/19   Annice Needy, MD  atorvastatin (LIPITOR) 10 MG tablet Take 1 tablet (10 mg total) by mouth daily. 11/14/19 05/01/24  Annice Needy, MD  clopidogrel (PLAVIX) 75 MG tablet Take 1 tablet (75 mg total) by mouth daily. Patient not taking: Reported on 05/02/2023 11/14/19   Annice Needy, MD  gabapentin (NEURONTIN) 300 MG capsule Take 1 capsule (300 mg total) by mouth 2 (two) times daily. 08/11/20   Georgiana Spinner, NP  HYDROcodone-acetaminophen (NORCO/VICODIN) 5-325 MG tablet Take 1 tablet by mouth every 6 (six) hours as needed for moderate pain. 05/17/23 05/16/24  Manson Passey,  Erma Pinto, NP    Social History   Socioeconomic History   Marital status: Married    Spouse name: Not on file   Number of children: Not on file   Years of education: Not on file   Highest education level: Not on file  Occupational History   Not on file  Tobacco Use   Smoking status: Every Day    Current packs/day: 1.00    Types: Cigarettes   Smokeless tobacco: Never  Vaping Use   Vaping status: Never Used  Substance and Sexual Activity   Alcohol use: Yes     Alcohol/week: 5.0 standard drinks of alcohol    Types: 5 Cans of beer per week    Comment: 4-5 beers a week   Drug use: Yes    Frequency: 3.0 times per week    Types: Marijuana    Comment: 3-4 times a week   Sexual activity: Yes  Other Topics Concern   Not on file  Social History Narrative   Live in private residence with wife, Steward Drone.   Social Determinants of Health   Financial Resource Strain: Not on file  Food Insecurity: Not on file  Transportation Needs: Not on file  Physical Activity: Not on file  Stress: Not on file  Social Connections: Not on file  Intimate Partner Violence: Not on file     Family History  Problem Relation Age of Onset   Leukemia Mother    Heart disease Father        a. CABG   Heart disease Sister        a. MVR   Cancer Sister    Cancer Brother    CAD Brother        a. CABG    ROS: Otherwise negative unless mentioned in HPI  Physical Examination  Vitals:   05/18/23 1247  BP: 115/75  Pulse: 63  Resp: 16  Temp: 99.1 F (37.3 C)  SpO2: 98%   Body mass index is 22.75 kg/m.  General:  WDWN in NAD Gait: Not observed HENT: WNL, normocephalic Pulmonary: normal non-labored breathing, without Rales, rhonchi,  wheezing Cardiac: regular, without  Murmurs, rubs or gallops; without carotid bruits Abdomen: positive bowel sounds, soft, NT/ND, no masses Skin: without rashes Vascular Exam/Pulses: positive weak bilateral lower extremity pulses.  Extremities: with ischemic changes, without Gangrene , with cellulitis; without open wounds;  Musculoskeletal: no muscle wasting or atrophy  Neurologic: A&O X 3;  No focal weakness or paresthesias are detected; speech is fluent/normal Psychiatric:  The pt has Normal affect. Lymph:  Unremarkable  CBC    Component Value Date/Time   WBC 13.6 (H) 05/18/2023 1253   RBC 4.67 05/18/2023 1253   HGB 14.6 05/18/2023 1253   HCT 43.7 05/18/2023 1253   PLT 410 (H) 05/18/2023 1253   MCV 93.6 05/18/2023 1253    MCH 31.3 05/18/2023 1253   MCHC 33.4 05/18/2023 1253   RDW 13.2 05/18/2023 1253    BMET    Component Value Date/Time   NA 131 (L) 05/18/2023 1253   K 3.8 05/18/2023 1253   CL 94 (L) 05/18/2023 1253   CO2 27 05/18/2023 1253   GLUCOSE 99 05/18/2023 1253   BUN 8 05/18/2023 1253   CREATININE 0.83 05/18/2023 1253   CALCIUM 9.0 05/18/2023 1253   GFRNONAA >60 05/18/2023 1253   GFRAA >60 12/04/2019 0523    COAGS: Lab Results  Component Value Date   INR 0.9 11/28/2019   INR 0.9 11/25/2019  Non-Invasive Vascular Imaging:   MRI of the foot ordered.   Statin:  Yes.   Beta Blocker:  No. Aspirin:  Yes.   ACEI:  No. ARB:  No. CCB use:  No Other antiplatelets/anticoagulants:  Yes.   Plavix 75 mg Daily   ASSESSMENT/PLAN: This is a 62 y.o. male who presents to [facility] ER with right foot pain. Upon exam patient noted to have doppler PT/DP pulses to right lower extremity. He is noted to have cellulitis. He was scheduled for RLE angiogram on Monday already but came to the ER due to increased pain.   PLAN: Continue Heparin Infusion through the weekend. Continue Antibiotics through the weekend.  Vascular Surgery to take the patient to the vascular Lab on Monday 05/21/2023 for right lower extremity angiogram. I discussed in detail the procedure, benefits, risks and complications. Patient verbalizes his understanding and wishes to proceed as soon as possible. Patient will be made NPO after midnight on Sunday for procedure Monday.  - I discussed the plan with Dr Festus Barren MD and he agrees with the plan.    [name] [facility] 05/18/2023 4:11 PM

## 2023-05-18 NOTE — Consult Note (Signed)
PHARMACY - BRIEF ANTIBIOTIC NOTE   Pharmacy has received consult(s) for vancomycin from an ED provider. The patient's profile has been reviewed for ht/wt/allergies/indication/available labs.    One time order(s) placed for: vancomycin 1750 mg IV  Further antibiotics/pharmacy consults should be ordered by admitting physician if indicated.                       Thank you,  Will M. Dareen Piano, PharmD Clinical Pharmacist 05/18/2023 2:15 PM

## 2023-05-18 NOTE — Telephone Encounter (Signed)
Patient spouse notified rx has been sent. Patient spouse informed that they were on the way to ED to take Mr Brendan Williams due to his right foot turning black. I will make our providers aware. She states that they have been trying to have go to the hospital for past two weeks.

## 2023-05-18 NOTE — H&P (View-Only) (Signed)
Hospital Consult    Reason for Consult:  Right Lower extremity Ischemia Requesting Physician:  Dr Imagene Gurney MD MRN #:  [id number]  History of Present Illness: This is a 62 y.o. male with medical history significant of severe PAD s/p stenting (Most recent April 2023), tobacco use disorder, neuropathy, who presents to the ED due to right foot pain. Patient well known to Dr Festus Barren. 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   On exam in the emergency department this afternoon the patient is somewhat uncomfortable due to the pain to his right lower extremity.  Pictures were taken of his foot and placed in the media files.  Patient noted to have weak but Doppler pulses both 2 posttibial and dorsalis pedis.  Patient noted to have +2 to +3 swelling of his foot due to cellulitis.  Patient's toes are cold to touch and slightly mottled purple.  Patient was scheduled for an angiogram this coming Monday on 05/21/2023 with Dr. Festus Barren MD but felt it would be better if he came to the emergency department today as his pain and symptoms worsened.  Past Medical History:  Diagnosis Date   Peripheral arterial disease [facility])     Past Surgical History:  Procedure Laterality Date   AMPUTATION TOE Right 12/02/2019   Procedure: AMPUTATION RIGHT 5TH TOE;  Surgeon: Rosetta Posner, DPM;  Location: [facility] ORS;  Service: Podiatry;  Laterality: Right;   APPLICATION OF WOUND VAC Bilateral 11/28/2019   Procedure: APPLICATION OF WOUND VAC;  Surgeon: Annice Needy, MD;  Location: [facility] ORS;  Service: Vascular;  Laterality: Bilateral;  Prevena    BACK SURGERY     lower ruptured disk   CENTRAL VENOUS CATHETER INSERTION  11/28/2019   Procedure: INSERTION CENTRAL LINE ADULT;  Surgeon: Annice Needy, MD;  Location: [facility] ORS;  Service: Vascular;;   ENDARTERECTOMY FEMORAL Bilateral 11/28/2019   Procedure: ENDARTERECTOMY FEMORAL;  Surgeon: Annice Needy, MD;  Location: [facility] OR[facility];  Service: Vascular;  Laterality:  Bilateral;   LOWER EXTREMITY ANGIOGRAPHY Right 11/14/2019   Procedure: LOWER EXTREMITY ANGIOGRAPHY;  Surgeon: Annice Needy, MD;  Location: [facility] INVASIVE CV LAB;  Service: Cardiovascular;  Laterality: Right;   LOWER EXTREMITY ANGIOGRAPHY Right 12/19/2021   Procedure: Lower Extremity Angiography;  Surgeon: Annice Needy, MD;  Location: [facility] INVASIVE CV LAB;  Service: Cardiovascular;  Laterality: Right;   LOWER EXTREMITY ANGIOGRAPHY Right 01/30/2022   Procedure: Lower Extremity Angiography;  Surgeon: Annice Needy, MD;  Location: [facility] INVASIVE CV LAB;  Service: Cardiovascular;  Laterality: Right;   STOMACH SURGERY     TONSILECTOMY, ADENOIDECTOMY, BILATERAL MYRINGOTOMY AND TUBES      Not on File  Prior to Admission medications   Medication Sig Start Date End Date Taking? Authorizing Provider  aspirin EC 81 MG tablet Take 1 tablet (81 mg total) by mouth daily. 11/14/19   Annice Needy, MD  atorvastatin (LIPITOR) 10 MG tablet Take 1 tablet (10 mg total) by mouth daily. 11/14/19 05/01/24  Annice Needy, MD  clopidogrel (PLAVIX) 75 MG tablet Take 1 tablet (75 mg total) by mouth daily. Patient not taking: Reported on 05/02/2023 11/14/19   Annice Needy, MD  gabapentin (NEURONTIN) 300 MG capsule Take 1 capsule (300 mg total) by mouth 2 (two) times daily. 08/11/20   Georgiana Spinner, NP  HYDROcodone-acetaminophen (NORCO/VICODIN) 5-325 MG tablet Take 1 tablet by mouth every 6 (six) hours as needed for moderate pain. 05/17/23 05/16/24  Manson Passey,  Erma Pinto, NP    Social History   Socioeconomic History   Marital status: Married    Spouse name: Not on file   Number of children: Not on file   Years of education: Not on file   Highest education level: Not on file  Occupational History   Not on file  Tobacco Use   Smoking status: Every Day    Current packs/day: 1.00    Types: Cigarettes   Smokeless tobacco: Never  Vaping Use   Vaping status: Never Used  Substance and Sexual Activity   Alcohol use: Yes     Alcohol/week: 5.0 standard drinks of alcohol    Types: 5 Cans of beer per week    Comment: 4-5 beers a week   Drug use: Yes    Frequency: 3.0 times per week    Types: Marijuana    Comment: 3-4 times a week   Sexual activity: Yes  Other Topics Concern   Not on file  Social History Narrative   Live in private residence with wife, Steward Drone.   Social Determinants of Health   Financial Resource Strain: Not on file  Food Insecurity: Not on file  Transportation Needs: Not on file  Physical Activity: Not on file  Stress: Not on file  Social Connections: Not on file  Intimate Partner Violence: Not on file     Family History  Problem Relation Age of Onset   Leukemia Mother    Heart disease Father        a. CABG   Heart disease Sister        a. MVR   Cancer Sister    Cancer Brother    CAD Brother        a. CABG    ROS: Otherwise negative unless mentioned in HPI  Physical Examination  Vitals:   05/18/23 1247  BP: 115/75  Pulse: 63  Resp: 16  Temp: 99.1 F (37.3 C)  SpO2: 98%   Body mass index is 22.75 kg/m.  General:  WDWN in NAD Gait: Not observed HENT: WNL, normocephalic Pulmonary: normal non-labored breathing, without Rales, rhonchi,  wheezing Cardiac: regular, without  Murmurs, rubs or gallops; without carotid bruits Abdomen: positive bowel sounds, soft, NT/ND, no masses Skin: without rashes Vascular Exam/Pulses: positive weak bilateral lower extremity pulses.  Extremities: with ischemic changes, without Gangrene , with cellulitis; without open wounds;  Musculoskeletal: no muscle wasting or atrophy  Neurologic: A&O X 3;  No focal weakness or paresthesias are detected; speech is fluent/normal Psychiatric:  The pt has Normal affect. Lymph:  Unremarkable  CBC    Component Value Date/Time   WBC 13.6 (H) 05/18/2023 1253   RBC 4.67 05/18/2023 1253   HGB 14.6 05/18/2023 1253   HCT 43.7 05/18/2023 1253   PLT 410 (H) 05/18/2023 1253   MCV 93.6 05/18/2023 1253    MCH 31.3 05/18/2023 1253   MCHC 33.4 05/18/2023 1253   RDW 13.2 05/18/2023 1253    BMET    Component Value Date/Time   NA 131 (L) 05/18/2023 1253   K 3.8 05/18/2023 1253   CL 94 (L) 05/18/2023 1253   CO2 27 05/18/2023 1253   GLUCOSE 99 05/18/2023 1253   BUN 8 05/18/2023 1253   CREATININE 0.83 05/18/2023 1253   CALCIUM 9.0 05/18/2023 1253   GFRNONAA >60 05/18/2023 1253   GFRAA >60 12/04/2019 0523    COAGS: Lab Results  Component Value Date   INR 0.9 11/28/2019   INR 0.9 11/25/2019  Non-Invasive Vascular Imaging:   MRI of the foot ordered.   Statin:  Yes.   Beta Blocker:  No. Aspirin:  Yes.   ACEI:  No. ARB:  No. CCB use:  No Other antiplatelets/anticoagulants:  Yes.   Plavix 75 mg Daily   ASSESSMENT/PLAN: This is a 62 y.o. male who presents to [facility] ER with right foot pain. Upon exam patient noted to have doppler PT/DP pulses to right lower extremity. He is noted to have cellulitis. He was scheduled for RLE angiogram on Monday already but came to the ER due to increased pain.   PLAN: Continue Heparin Infusion through the weekend. Continue Antibiotics through the weekend.  Vascular Surgery to take the patient to the vascular Lab on Monday 05/21/2023 for right lower extremity angiogram. I discussed in detail the procedure, benefits, risks and complications. Patient verbalizes his understanding and wishes to proceed as soon as possible. Patient will be made NPO after midnight on Sunday for procedure Monday.  - I discussed the plan with Dr Festus Barren MD and he agrees with the plan.    [name] [facility] 05/18/2023 4:11 PM

## 2023-05-19 DIAGNOSIS — I70221 Atherosclerosis of native arteries of extremities with rest pain, right leg: Secondary | ICD-10-CM | POA: Diagnosis not present

## 2023-05-19 LAB — HEPARIN LEVEL (UNFRACTIONATED)
Heparin Unfractionated: <0.1 IU/mL — ABNORMAL LOW (ref 0.30–0.70)
Heparin Unfractionated: 0.27 IU/mL — ABNORMAL LOW (ref 0.30–0.70)

## 2023-05-19 MED ORDER — HEPARIN BOLUS VIA INFUSION
1100.0000 [IU] | Freq: Once | INTRAVENOUS | Status: AC
  Administered 2023-05-19: 1100 [IU] via INTRAVENOUS
  Filled 2023-05-19: qty 1100

## 2023-05-19 MED ORDER — HEPARIN BOLUS VIA INFUSION
2300.0000 [IU] | Freq: Once | INTRAVENOUS | Status: AC
  Administered 2023-05-19: 2300 [IU] via INTRAVENOUS
  Filled 2023-05-19: qty 2300

## 2023-05-19 MED ORDER — ASPIRIN 81 MG PO TBEC
81.0000 mg | DELAYED_RELEASE_TABLET | Freq: Every day | ORAL | Status: DC
  Administered 2023-05-19 – 2023-05-23 (×5): 81 mg via ORAL
  Filled 2023-05-19 (×5): qty 1

## 2023-05-19 MED ORDER — HYDROCODONE-ACETAMINOPHEN 5-325 MG PO TABS
1.0000 | ORAL_TABLET | Freq: Four times a day (QID) | ORAL | Status: DC | PRN
  Administered 2023-05-19 – 2023-05-20 (×2): 1 via ORAL
  Filled 2023-05-19 (×2): qty 1

## 2023-05-19 MED ORDER — ORAL CARE MOUTH RINSE: 15.0000 mL | OROMUCOSAL | Status: DC | PRN

## 2023-05-19 NOTE — Consult Note (Signed)
Pharmacy Consult Note - Anticoagulation  Pharmacy Consult for heparin Indication: DVT  PATIENT MEASUREMENTS: Height: 6\' 1"  (185.4 cm) Weight: 78.2 kg (172 lb 6.4 oz) IBW/kg (Calculated) : 79.9 HEPARIN DW (KG): 78.2  VITAL SIGNS: Temp: 97.8 F (36.6 C) (08/03 0406) Temp Source: Oral (08/03 0406) BP: 113/73 (08/03 0406) Pulse Rate: 71 (08/02 1845)  Recent Labs    05/18/23 1253 05/18/23 2212 05/19/23 0517  HGB 14.6  --  13.1  HCT 43.7  --  39.4  PLT 410*  --  369  APTT 40*  --   --   LABPROT 14.2  --   --   INR 1.1  --   --   HEPARINUNFRC  --    < > 0.14*  CREATININE 0.83  --   --    < > = values in this interval not displayed.    Estimated Creatinine Clearance: 102.1 mL/min (by C-G formula based on SCr of 0.83 mg/dL).  PAST MEDICAL HISTORY: Past Medical History:  Diagnosis Date   Peripheral arterial disease [facility])     ASSESSMENT: 62 y.o. male with PMH PAD s/p left toe amputation, ischemic pain, neuropathy is presenting with right foot pain and swelling, reportedly turning black, concerning for limb ischemia and/or potential VTE. Patient had procedure scheduled with vascular for 8/5 but was urged to come to ED today due to foot reportedly turning black. Patient is not on chronic anticoagulation per chart review.  Pharmacy has been consulted to initiate and manage heparin intravenous infusion.  Pertinent medications: bASA daily No chronic anticoag PTA per chart review  Goal(s) of therapy: Heparin level 0.3 - 0.7 units/mL Monitor platelets by anticoagulation protocol: Yes   Baseline anticoagulation labs: Recent Labs    05/18/23 1253 05/19/23 0517  APTT 40*  --   INR 1.1  --   HGB 14.6 13.1  PLT 410* 369    Date Time aPTT/HL Rate/Comment 8/2       2212     < 0.1              1300 units/hr - gtt paused for MRI 8/3       0517        0.14            1300 units/hr    PLAN: 8/3:  HL @ 0517 = 0.14, SUBtherapeutic  - Will rebolus this pt 2300 units and  increase drip rate to 1600 units/hr - Will recheck HL in 6 hrs after rate change  Monitor CBC daily while on heparin infusion.   Ziona Wickens D Clinical Pharmacist 05/19/2023 6:01 AM

## 2023-05-19 NOTE — Consult Note (Signed)
Pharmacy Consult Note - Anticoagulation  Pharmacy Consult for heparin Indication: DVT  PATIENT MEASUREMENTS: Height: 6\' 1"  (185.4 cm) Weight: 78.2 kg (172 lb 6.4 oz) IBW/kg (Calculated) : 79.9 HEPARIN DW (KG): 78.2  VITAL SIGNS: Temp: 98.1 F (36.7 C) (08/02 2301) Temp Source: Oral (08/02 2301) BP: 92/65 (08/02 2301) Pulse Rate: 71 (08/02 1845)  Recent Labs    05/18/23 1253 05/18/23 2212  HGB 14.6  --   HCT 43.7  --   PLT 410*  --   APTT 40*  --   LABPROT 14.2  --   INR 1.1  --   HEPARINUNFRC  --  <0.10*  CREATININE 0.83  --     Estimated Creatinine Clearance: 102.1 mL/min (by C-G formula based on SCr of 0.83 mg/dL).  PAST MEDICAL HISTORY: Past Medical History:  Diagnosis Date   Peripheral arterial disease [facility])     ASSESSMENT: 62 y.o. male with PMH PAD s/p left toe amputation, ischemic pain, neuropathy is presenting with right foot pain and swelling, reportedly turning black, concerning for limb ischemia and/or potential VTE. Patient had procedure scheduled with vascular for 8/5 but was urged to come to ED today due to foot reportedly turning black. Patient is not on chronic anticoagulation per chart review.  Pharmacy has been consulted to initiate and manage heparin intravenous infusion.  Pertinent medications: bASA daily No chronic anticoag PTA per chart review  Goal(s) of therapy: Heparin level 0.3 - 0.7 units/mL Monitor platelets by anticoagulation protocol: Yes   Baseline anticoagulation labs: Recent Labs    05/18/23 1253  APTT 40*  INR 1.1  HGB 14.6  PLT 410*    Date Time aPTT/HL Rate/Comment 8/2       2212     < 0.1              1300 units/hr - gtt paused for MRI   PLAN: 8/2:  HL @ 2212 = < 0.1, SUBtherapeutic - RN reports heparin gtt was paused for MRI but unsure for how long. - Will rebolus this pt 2300 units and continue previous infusion rate of 1300 units/hr.  - Will recheck HL in 6 hrs on 8/3 @ 0600. Monitor CBC daily while on  heparin infusion.   Rhaelyn Giron D Clinical Pharmacist 05/19/2023 12:39 AM

## 2023-05-19 NOTE — Consult Note (Signed)
Pharmacy Consult Note - Anticoagulation  Pharmacy Consult for heparin Indication: DVT  PATIENT MEASUREMENTS: Height: 6\' 1"  (185.4 cm) Weight: 78.2 kg (172 lb 6.4 oz) IBW/kg (Calculated) : 79.9 HEPARIN DW (KG): 78.2  VITAL SIGNS: Temp: 97.6 F (36.4 C) (08/03 1219) Temp Source: Oral (08/03 0406) BP: 90/60 (08/03 1219) Pulse Rate: 63 (08/03 1219)  Recent Labs    05/18/23 1253 05/18/23 2212 05/19/23 0517 05/19/23 1245  HGB 14.6  --  13.1  --   HCT 43.7  --  39.4  --   PLT 410*  --  369  --   APTT 40*  --   --   --   LABPROT 14.2  --   --   --   INR 1.1  --   --   --   HEPARINUNFRC  --    < > 0.14* <0.10*  CREATININE 0.83  --  0.65  --    < > = values in this interval not displayed.    Estimated Creatinine Clearance: 105.9 mL/min (by C-G formula based on SCr of 0.65 mg/dL).  PAST MEDICAL HISTORY: Past Medical History:  Diagnosis Date   Peripheral arterial disease [facility])     ASSESSMENT: 62 y.o. male with PMH PAD s/p left toe amputation, ischemic pain, neuropathy is presenting with right foot pain and swelling, reportedly turning black, concerning for limb ischemia and/or potential VTE. Patient had procedure scheduled with vascular for 8/5 but was urged to come to ED today due to foot reportedly turning black. Patient is not on chronic anticoagulation per chart review.  Pharmacy has been consulted to initiate and manage heparin intravenous infusion.  Pertinent medications: bASA daily No chronic anticoag PTA per chart review  Goal(s) of therapy: Heparin level 0.3 - 0.7 units/mL Monitor platelets by anticoagulation protocol: Yes   Baseline anticoagulation labs: Recent Labs    05/18/23 1253 05/19/23 0517  APTT 40*  --   INR 1.1  --   HGB 14.6 13.1  PLT 410* 369    Date Time aPTT/HL Rate/Comment 8/2       2212     < 0.1              1300 units/hr - gtt paused for MRI 8/3       0517        0.14            1300 units/hr  8/3 1245       <0.10  Subtherapeutic,1600>1850 u/hr   PLAN: 8/3 1245  HL<0.10, SUBtherapeutic  - Will rebolus 2300 units and increase drip rate to 1850 units/hr - Will recheck HL in 6 hrs after rate change  Monitor CBC daily while on heparin infusion.   Sherria Riemann A Clinical Pharmacist 05/19/2023 2:01 PM

## 2023-05-19 NOTE — Consult Note (Signed)
Pharmacy Consult Note - Anticoagulation  Pharmacy Consult for heparin Indication: DVT  PATIENT MEASUREMENTS: Height: 6\' 1"  (185.4 cm) Weight: 78.2 kg (172 lb 6.4 oz) IBW/kg (Calculated) : 79.9 HEPARIN DW (KG): 78.2  VITAL SIGNS: Temp: 98.1 F (36.7 C) (08/03 1957) BP: 91/62 (08/03 1957) Pulse Rate: 56 (08/03 1957)  Recent Labs    05/18/23 1253 05/18/23 2212 05/19/23 0517 05/19/23 1245 05/19/23 2040  HGB 14.6  --  13.1  --   --   HCT 43.7  --  39.4  --   --   PLT 410*  --  369  --   --   APTT 40*  --   --   --   --   LABPROT 14.2  --   --   --   --   INR 1.1  --   --   --   --   HEPARINUNFRC  --    < > 0.14*   < > 0.27*  CREATININE 0.83  --  0.65  --   --    < > = values in this interval not displayed.    Estimated Creatinine Clearance: 105.9 mL/min (by C-G formula based on SCr of 0.65 mg/dL).  PAST MEDICAL HISTORY: Past Medical History:  Diagnosis Date   Peripheral arterial disease [facility])     ASSESSMENT: 62 y.o. male with PMH PAD s/p left toe amputation, ischemic pain, neuropathy is presenting with right foot pain and swelling, reportedly turning black, concerning for limb ischemia and/or potential VTE. Patient had procedure scheduled with vascular for 8/5 but was urged to come to ED today due to foot reportedly turning black. Patient is not on chronic anticoagulation per chart review.  Pharmacy has been consulted to initiate and manage heparin intravenous infusion.  Pertinent medications: bASA daily No chronic anticoag PTA per chart review  Goal(s) of therapy: Heparin level 0.3 - 0.7 units/mL Monitor platelets by anticoagulation protocol: Yes   Baseline anticoagulation labs: Recent Labs    05/18/23 1253 05/19/23 0517  APTT 40*  --   INR 1.1  --   HGB 14.6 13.1  PLT 410* 369    Date Time aPTT/HL Rate/Comment 8/2       2212     < 0.1              1300 units/hr - gtt paused for MRI 8/3       0517        0.14            1300 units/hr  8/3 1245       <0.10  Subtherapeutic,1600>1850 u/hr 8/3 2040      0.27 Subtherapeutic @ 1850 u/hr  PLAN: HL is subtherapeutic Give heparin 1100 units IV x 1 Increase heparin infusion rate to 2000 units/hr Recheck heparin level in 6 hours after rate change Daily CBC while on heparin   Barrie Folk Clinical Pharmacist 05/19/2023 9:28 PM

## 2023-05-19 NOTE — Consult Note (Signed)
PODIATRY / FOOT AND ANKLE SURGERY CONSULTATION NOTE  Requesting Physician: Dr. Myriam Forehand  Reason for consult: R foot gangrene  Chief Complaint: Right foot pain   HPI: Brendan Williams is a 62 y.o. male who presents with worsening right foot pain and discomfort with discoloration of foot.  Patient is well-known history of peripheral vascular disease and was admitted to the hospital due to concerns for critical limb ischemia.  Patient is scheduled for angiogram on Monday with Dr. Wyn Quaker.  Patient still complains of pain and discomfort to his right foot.  He notes redness and swelling to the foot as well as purplish discoloration of toes and his foot is cold to touch in the right side.  PMHx:  Past Medical History:  Diagnosis Date   Peripheral arterial disease (HCC)     Surgical Hx:  Past Surgical History:  Procedure Laterality Date   AMPUTATION TOE Right 12/02/2019   Procedure: AMPUTATION RIGHT 5TH TOE;  Surgeon: Rosetta Posner, DPM;  Location: [facility];  Service: Podiatry;  Laterality: Right;   APPLICATION OF WOUND VAC Bilateral 11/28/2019   Procedure: APPLICATION OF WOUND VAC;  Surgeon: Annice Needy, MD;  Location: [facility];  Service: Vascular;  Laterality: Bilateral;  [facility]    BACK SURGERY     lower ruptured disk   CENTRAL VENOUS CATHETER INSERTION  11/28/2019   Procedure: INSERTION CENTRAL LINE ADULT;  Surgeon: Annice Needy, MD;  Location: [facility];  Service: Vascular;;   ENDARTERECTOMY FEMORAL Bilateral 11/28/2019   Procedure: ENDARTERECTOMY FEMORAL;  Surgeon: Annice Needy, MD;  Location: [facility];  Service: Vascular;  Laterality: Bilateral;   LOWER EXTREMITY ANGIOGRAPHY Right 11/14/2019   Procedure: LOWER EXTREMITY ANGIOGRAPHY;  Surgeon: Annice Needy, MD;  Location: [facility];  Service: Cardiovascular;  Laterality: Right;   LOWER EXTREMITY ANGIOGRAPHY Right 12/19/2021   Procedure: Lower Extremity Angiography;  Surgeon: Annice Needy, MD;  Location: [facility];  Service:  Cardiovascular;  Laterality: Right;   LOWER EXTREMITY ANGIOGRAPHY Right 01/30/2022   Procedure: Lower Extremity Angiography;  Surgeon: Annice Needy, MD;  Location: [facility];  Service: Cardiovascular;  Laterality: Right;   STOMACH SURGERY     TONSILECTOMY, ADENOIDECTOMY, BILATERAL MYRINGOTOMY AND TUBES      FHx:  Family History  Problem Relation Age of Onset   Leukemia Mother    Heart disease Father        a. CABG   Heart disease Sister        a. MVR   Cancer Sister    Cancer Brother    CAD Brother        a. CABG    Social History:  reports that he has been smoking cigarettes. He has never used smokeless tobacco. He reports current alcohol use of about 5.0 standard drinks of alcohol per week. He reports current drug use. Frequency: 3.00 times per week. Drug: Marijuana.  Allergies: Not on File  Medications Prior to Admission  Medication Sig Dispense Refill   HYDROcodone-acetaminophen (NORCO/VICODIN) 5-325 MG tablet Take 1 tablet by mouth every 6 (six) hours as needed for moderate pain. 20 tablet 0   aspirin EC 81 MG tablet Take 1 tablet (81 mg total) by mouth daily. 150 tablet 2   atorvastatin (LIPITOR) 10 MG tablet Take 1 tablet (10 mg total) by mouth daily. (Patient not taking: Reported on 05/18/2023) 30 tablet 11   clopidogrel (PLAVIX) 75 MG tablet Take 1 tablet (75 mg total) by  mouth daily. (Patient not taking: Reported on 05/02/2023) 30 tablet 11   gabapentin (NEURONTIN) 300 MG capsule Take 1 capsule (300 mg total) by mouth 2 (two) times daily. (Patient not taking: Reported on 05/18/2023) 60 capsule 6    Physical Exam: General: Alert and oriented.  No apparent distress.  Vascular: DP/PT pulse left side +1, right side nonpalpable.  Capillary fill time delayed to digits of the right foot.  Right forefoot appears to be cold to touch.  Associated erythema and edema present extending to the midfoot from digits.  Redness is improved with elevation to the right lower  extremity.  Neuro: Light touch sensation slightly reduced to bilateral lower extremities.  Derm: Skin appears to be thin and atrophic to bilateral lower extremities but worse on the right side than the left.  No current openings.  Gangrenous necrosis present which appears to be dry and stable and in an early stage to the right forefoot extending to the midfoot      MSK: Right fifth toe amputation.  Results for orders placed or performed during the hospital encounter of 05/18/23 (from the past 48 hour(s))  CBC     Status: Abnormal   Collection Time: 05/18/23 12:53 PM  Result Value Ref Range   WBC 13.6 (H) 4.0 - 10.5 K/uL   RBC 4.67 4.22 - 5.81 MIL/uL   Hemoglobin 14.6 13.0 - 17.0 g/dL   HCT 78.4 69.6 - 29.5 %   MCV 93.6 80.0 - 100.0 fL   MCH 31.3 26.0 - 34.0 pg   MCHC 33.4 30.0 - 36.0 g/dL   RDW 28.4 13.2 - 44.0 %   Platelets 410 (H) 150 - 400 K/uL   nRBC 0.0 0.0 - 0.2 %    Comment: Performed at [facility], [address] [place], [place] [address]  Comprehensive metabolic panel     Status: Abnormal   Collection Time: 05/18/23 12:53 PM  Result Value Ref Range   Sodium 131 (L) 135 - 145 mmol/L   Potassium 3.8 3.5 - 5.1 mmol/L   Chloride 94 (L) 98 - 111 mmol/L   CO2 27 22 - 32 mmol/L   Glucose, Bld 99 70 - 99 mg/dL    Comment: Glucose reference range applies only to samples taken after fasting for at least 8 hours.   BUN 8 8 - 23 mg/dL   Creatinine, Ser 5.36 0.61 - 1.24 mg/dL   Calcium 9.0 8.9 - 64.4 mg/dL   Total Protein 8.0 6.5 - 8.1 g/dL   Albumin 3.7 3.5 - 5.0 g/dL   AST 16 15 - 41 U/L   ALT 11 0 - 44 U/L   Alkaline Phosphatase 67 38 - 126 U/L   Total Bilirubin 0.7 0.3 - 1.2 mg/dL   GFR, Estimated >03 >47 mL/min    Comment: (NOTE) Calculated using the CKD-EPI Creatinine Equation (2021)    Anion gap 10 5 - 15    Comment: Performed at [facility], [address] [place], [place] [address]  Protime-INR     Status: None   Collection Time:  05/18/23 12:53 PM  Result Value Ref Range   Prothrombin Time 14.2 11.4 - 15.2 seconds   INR 1.1 0.8 - 1.2    Comment: (NOTE) INR goal varies based on device and disease states. Performed at [facility], [address] [place], [place] [address]   APTT     Status: Abnormal   Collection Time: 05/18/23 12:53 PM  Result Value Ref  Range   aPTT 40 (H) 24 - 36 seconds    Comment:        IF BASELINE aPTT IS ELEVATED, SUGGEST PATIENT RISK ASSESSMENT BE USED TO DETERMINE APPROPRIATE ANTICOAGULANT THERAPY. Performed at [facility], [address] [place], [place] [address]   Culture, blood (single)     Status: None (Preliminary result)   Collection Time: 05/18/23  3:15 PM   Specimen: BLOOD LEFT ARM  Result Value Ref Range   Specimen Description BLOOD LEFT ARM    Special Requests      BOTTLES DRAWN AEROBIC AND ANAEROBIC Blood Culture adequate volume   Culture      NO GROWTH < 24 HOURS Performed at [facility], [address] [place], [place] [address]    Report Status PENDING   Heparin level (unfractionated)     Status: Abnormal   Collection Time: 05/18/23 10:12 PM  Result Value Ref Range   Heparin Unfractionated <0.10 (L) 0.30 - 0.70 IU/mL    Comment: REPEATED TO VERIFY Performed at [facility], [address] [place], [place] [address]   CBC     Status: Abnormal   Collection Time: 05/19/23  5:17 AM  Result Value Ref Range   WBC 11.4 (H) 4.0 - 10.5 K/uL   RBC 4.13 (L) 4.22 - 5.81 MIL/uL   Hemoglobin 13.1 13.0 - 17.0 g/dL   HCT 38.7 56.4 - 33.2 %   MCV 95.4 80.0 - 100.0 fL   MCH 31.7 26.0 - 34.0 pg   MCHC 33.2 30.0 - 36.0 g/dL   RDW 95.1 88.4 - 16.6 %   Platelets 369 150 - 400 K/uL   nRBC 0.0 0.0 - 0.2 %    Comment: Performed at [facility], [address] [place], [place] [address]  HIV Antibody (routine testing w rflx)     Status: None   Collection Time: 05/19/23  5:17 AM  Result Value Ref Range   HIV Screen 4th  Generation wRfx Non Reactive Non Reactive    Comment: Performed at [facility], [address] [place], [place] [address]  Comprehensive metabolic panel     Status: Abnormal   Collection Time: 05/19/23  5:17 AM  Result Value Ref Range   Sodium 137 135 - 145 mmol/L   Potassium 3.6 3.5 - 5.1 mmol/L   Chloride 105 98 - 111 mmol/L   CO2 23 22 - 32 mmol/L   Glucose, Bld 96 70 - 99 mg/dL    Comment: Glucose reference range applies only to samples taken after fasting for at least 8 hours.   BUN 6 (L) 8 - 23 mg/dL   Creatinine, Ser 3.23 0.61 - 1.24 mg/dL   Calcium 8.4 (L) 8.9 - 10.3 mg/dL   Total Protein 6.3 (L) 6.5 - 8.1 g/dL   Albumin 2.9 (L) 3.5 - 5.0 g/dL   AST 13 (L) 15 - 41 U/L   ALT 8 0 - 44 U/L   Alkaline Phosphatase 54 38 - 126 U/L   Total Bilirubin 0.5 0.3 - 1.2 mg/dL   GFR, Estimated >55 >73 mL/min    Comment: (NOTE) Calculated using the CKD-EPI Creatinine Equation (2021)    Anion gap 9 5 - 15    Comment: Performed at [facility], [address], [place], [place] [address]  Lipid panel     Status: Abnormal   Collection Time: 05/19/23  5:17 AM  Result Value Ref Range   Cholesterol 78 0 -  200 mg/dL   Triglycerides 79 <829 mg/dL   HDL 35 (L) >56 mg/dL   Total CHOL/HDL Ratio 2.2 RATIO   VLDL 16 0 - 40 mg/dL   LDL Cholesterol 27 0 - 99 mg/dL    Comment:        Total Cholesterol/HDL:CHD Risk Coronary Heart Disease Risk Table                     Men   Women  1/2 Average Risk   3.4   3.3  Average Risk       5.0   4.4  2 X Average Risk   9.6   7.1  3 X Average Risk  23.4   11.0        Use the calculated Patient Ratio above and the CHD Risk Table to determine the patient's CHD Risk.        ATP III CLASSIFICATION (LDL):  <100     mg/dL   Optimal  213-086  mg/dL   Near or Above                    Optimal  130-159  mg/dL   Borderline  578-469  mg/dL   High  >629     mg/dL   Very High Performed at [facility], [address] [place],  [place] [address]   Heparin level (unfractionated)     Status: Abnormal   Collection Time: 05/19/23  5:17 AM  Result Value Ref Range   Heparin Unfractionated 0.14 (L) 0.30 - 0.70 IU/mL    Comment: (NOTE) The clinical reportable range upper limit is being lowered to >1.10 to align with the FDA approved guidance for the current laboratory assay.  If heparin results are below expected values, and patient dosage has  been confirmed, suggest follow up testing of antithrombin III levels. Performed at [facility], [address] [place], [place] [address]    MR FOOT RIGHT W WO CONTRAST  Result Date: 05/18/2023 CLINICAL DATA:  Soft tissue infection suspected. Osteonecrosis suspected. Entire foot pain, swelling, and redness. History of fifth ray amputation. Ulcer in this region. EXAM: MRI OF THE RIGHT FOREFOOT WITHOUT AND WITH CONTRAST TECHNIQUE: Multiplanar, multisequence MR imaging of the right forefoot was performed before and after the administration of intravenous contrast. CONTRAST:  7.86mL GADAVIST GADOBUTROL 1 MMOL/ML IV SOLN COMPARISON:  Right foot radiographs 12/02/2019 and 12/01/2019 FINDINGS: Bones/Joint/Cartilage Postsurgical changes are again seen of amputation of fifth ray to the mid shaft. The amputation margins are sharp. There is a tiny great toe metatarsophalangeal joint effusion. No marrow edema is seen. No cortical erosion is seen. There is increased T2 signal within the third and fourth toe phalanges on coronal T2 fat saturation images, however this is due to inhomogeneous fat saturation as normal signal is seen in these regions on the sagittal STIR images. Ligaments The Lisfranc ligament complex is intact. The metatarsophalangeal collateral ligaments are intact. Muscles and Tendons There is moderate diffuse edema within the plantar midfoot musculature, nonspecific myositis. No tendon tear is visualized. Soft tissues There is mild-to-moderate lateral ankle and dorsal lateral midfoot  MR mild medial ankle and dorsal medial midfoot subcutaneous fat edema and swelling. Reportedly there is a soft tissue ulcer in the region of the prior fifth digit amputation surgery. There is mild fluid and edema seen on axial images 20 through 23 chest dorsal lateral to the fourth metatarsal head. No abscess is seen. No cortical erosion  is seen. IMPRESSION: 1. Postsurgical changes of remote amputation of the fifth ray to the mid shaft. Normal bone amputation site. 2. Reportedly there is an ulcer in the region of the prior fifth toe amputation site. No sinus tract, abscess, or osteomyelitis is seen. 3. Mild-to-moderate lateral ankle and dorsal lateral midfoot subcutaneous fat edema and swelling. 4. Moderate diffuse edema within the plantar midfoot musculature, nonspecific myositis. Electronically Signed   By: Neita Garnet M.D.   On: 05/18/2023 21:34    Blood pressure 99/70, pulse (!) 56, temperature 97.8 F (36.6 C), resp. rate 18, height 6\' 1"  (1.854 m), weight 78.2 kg, SpO2 99%.  Assessment Critical limb ischemia right lower extremity with gangrenous changes present Cellulitis right foot PVD  Plan -Patient seen and examined. -Patient noted to have gangrenous changes to the right forefoot.  Possible cellulitic changes but believe more likely related to ischemic changes as far as redness and swelling. -Appreciate medicine recommendations for antibiotic therapy. -Appreciate vascular recommendations.  Plan for angiogram Monday. -Would like to allow tissues to demarcate to see if amputation is needed or not.  Recommend keeping dry and stable at this time.  Believe that redness and swelling to the right lower extremity is more so related to vascular issues versus true cellulitis  Rosetta Posner 05/19/2023, 10:23 AM

## 2023-05-19 NOTE — Progress Notes (Signed)
Progress Note    Brendan Williams  PIR:[id number] DOB: January 05, 1961  DOA: 05/18/2023 PCP: Gildardo Pounds, PA      Brief Narrative:    Medical records reviewed and are as summarized below:  Brendan Williams is a 62 y.o. male with medical history significant of severe PAD s/p stenting (Most recent April 2023), tobacco use disorder, neuropathy, who presented to the ED due to right foot pain and redness.  He had been experiencing pain in the right foot since he recently saw his vascular surgeon.  However, pain in the right foot had progressively worsened about 3 to 4 days prior to admission.  Noticed purplish/redness discoloration on the dorsal aspect of the right foot and toes.  He also endorsed fever at home.   Workup revealed right foot cellulitis and critical right limb ischemia.      Assessment/Plan:   Principal Problem:   Critical limb ischemia of right lower extremity (HCC) Active Problems:   Tobacco use disorder   Cellulitis   Cellulitis and abscess of toe of right foot    Body mass index is 22.75 kg/m.   PVD, critical limb ischemia of right lower extremity: Continue IV heparin drip and monitor heparin level per protocol.  Plan for right lower extremity angiogram on 05/21/2023.  Follow-up with vascular surgeon. Continue aspirin and Lipitor.  Plavix is on hold.   Right foot cellulitis: Continue empiric IV antibiotics (ceftriaxone and vancomycin).  Follow-up blood cultures.   Hypotension: Blood pressure is better.  He is asymptomatic.   Tobacco use disorder: Counseled to quit smoking cigarettes.   Diet Order             Diet regular Fluid consistency: Thin  Diet effective now                            Consultants: Vascular surgeon, podiatrist  Procedures: None    Medications:    atorvastatin  40 mg Oral Daily   sodium chloride flush  3 mL Intravenous Q12H   Continuous Infusions:  cefTRIAXone (ROCEPHIN)  IV     heparin 1,600  Units/hr (05/19/23 6301)   vancomycin 1,250 mg (05/19/23 0419)     Anti-infectives (From admission, onward)    Start     Dose/Rate Route Frequency Ordered Stop   05/19/23 1200  cefTRIAXone (ROCEPHIN) 1 g in sodium chloride 0.9 % 100 mL IVPB        1 g 200 mL/hr over 30 Minutes Intravenous Every 24 hours 05/18/23 1459 05/26/23 1159   05/19/23 0400  vancomycin (VANCOREADY) IVPB 1250 mg/250 mL        1,250 mg 166.7 mL/hr over 90 Minutes Intravenous Every 12 hours 05/18/23 1542     05/18/23 1500  vancomycin (VANCOREADY) IVPB 1750 mg/350 mL        1,750 mg 175 mL/hr over 120 Minutes Intravenous  Once 05/18/23 1414 05/18/23 1758   05/18/23 1400  vancomycin (VANCOCIN) IVPB 1000 mg/200 mL premix  Status:  Discontinued        1,000 mg 200 mL/hr over 60 Minutes Intravenous  Once 05/18/23 1349 05/18/23 1414   05/18/23 1400  cefTRIAXone (ROCEPHIN) 2 g in sodium chloride 0.9 % 100 mL IVPB        2 g 200 mL/hr over 30 Minutes Intravenous  Once 05/18/23 1349 05/18/23 1554              Family Communication/Anticipated D/C  date and plan/Code Status   DVT prophylaxis:      Code Status: Full Code  Family Communication: Plan discussed with his wife at the bedside Disposition Plan: Plan to discharge home when medically stable   Status is: Inpatient Remains inpatient appropriate because: Critical right limb ischemia, right foot cellulitis       Subjective:   Interval events noted.  He complains of soreness in the right foot.  He thinks redness on the right foot is improving slowly.  His wife is at the bedside.  Objective:    Vitals:   05/18/23 2147 05/18/23 2301 05/19/23 0406 05/19/23 0759  BP: 91/70 92/65 113/73 99/70  Pulse:    (!) 56  Resp: 20 20 20 18   Temp: 98 F (36.7 C) 98.1 F (36.7 C) 97.8 F (36.6 C) 97.8 F (36.6 C)  TempSrc: Oral Oral Oral   SpO2: 94% 96% 98% 99%  Weight:      Height:       No data found.   Intake/Output Summary (Last 24 hours) at  05/19/2023 1033 Last data filed at 05/19/2023 0800 Gross per 24 hour  Intake 1824.16 ml  Output 1545 ml  Net 279.16 ml   Filed Weights   05/18/23 1251  Weight: 78.2 kg    Exam:  GEN: NAD SKIN: Warm and dry EYES: No pallor or icterus ENT: MMM CV: RRR PULM: CTA B ABD: soft, ND, NT, +BS CNS: AAO x 3, non focal EXT: Swelling, erythema and tenderness of the right foot, mainly in the dorsal aspect        Data Reviewed:   I have personally reviewed following labs and imaging studies:  Labs: Labs show the following:   Basic Metabolic Panel: Recent Labs  Lab 05/18/23 1253 05/19/23 0517  NA 131* 137  K 3.8 3.6  CL 94* 105  CO2 27 23  GLUCOSE 99 96  BUN 8 6*  CREATININE 0.83 0.65  CALCIUM 9.0 8.4*   GFR Estimated Creatinine Clearance: 105.9 mL/min (by C-G formula based on SCr of 0.65 mg/dL). Liver Function Tests: Recent Labs  Lab 05/18/23 1253 05/19/23 0517  AST 16 13*  ALT 11 8  ALKPHOS 67 54  BILITOT 0.7 0.5  PROT 8.0 6.3*  ALBUMIN 3.7 2.9*   No results for input(s): "LIPASE", "AMYLASE" in the last 168 hours. No results for input(s): "AMMONIA" in the last 168 hours. Coagulation profile Recent Labs  Lab 05/18/23 1253  INR 1.1    CBC: Recent Labs  Lab 05/18/23 1253 05/19/23 0517  WBC 13.6* 11.4*  HGB 14.6 13.1  HCT 43.7 39.4  MCV 93.6 95.4  PLT 410* 369   Cardiac Enzymes: No results for input(s): "CKTOTAL", "CKMB", "CKMBINDEX", "TROPONINI" in the last 168 hours. BNP (last 3 results) No results for input(s): "PROBNP" in the last 8760 hours. CBG: No results for input(s): "GLUCAP" in the last 168 hours. D-Dimer: No results for input(s): "DDIMER" in the last 72 hours. Hgb A1c: No results for input(s): "HGBA1C" in the last 72 hours. Lipid Profile: Recent Labs    05/19/23 0517  CHOL 78  HDL 35*  LDLCALC 27  TRIG 79  CHOLHDL 2.2   Thyroid function studies: No results for input(s): "TSH", "T4TOTAL", "T3FREE", "THYROIDAB" in the last 72  hours.  Invalid input(s): "FREET3" Anemia work up: No results for input(s): "VITAMINB12", "FOLATE", "FERRITIN", "TIBC", "IRON", "RETICCTPCT" in the last 72 hours. Sepsis Labs: Recent Labs  Lab 05/18/23 1253 05/19/23 0517  WBC 13.6* 11.4*  Microbiology Recent Results (from the past 240 hour(s))  Culture, blood (single)     Status: None (Preliminary result)   Collection Time: 05/18/23  3:15 PM   Specimen: BLOOD LEFT ARM  Result Value Ref Range Status   Specimen Description BLOOD LEFT ARM  Final   Special Requests   Final    BOTTLES DRAWN AEROBIC AND ANAEROBIC Blood Culture adequate volume   Culture   Final    NO GROWTH < 24 HOURS Performed at [facility], [address], [place], [place] [address]    Report Status PENDING  Incomplete    Procedures and diagnostic studies:  MR FOOT RIGHT W WO CONTRAST  Result Date: 05/18/2023 CLINICAL DATA:  Soft tissue infection suspected. Osteonecrosis suspected. Entire foot pain, swelling, and redness. History of fifth ray amputation. Ulcer in this region. EXAM: MRI OF THE RIGHT FOREFOOT WITHOUT AND WITH CONTRAST TECHNIQUE: Multiplanar, multisequence MR imaging of the right forefoot was performed before and after the administration of intravenous contrast. CONTRAST:  7.79mL GADAVIST GADOBUTROL 1 MMOL/ML IV SOLN COMPARISON:  Right foot radiographs 12/02/2019 and 12/01/2019 FINDINGS: Bones/Joint/Cartilage Postsurgical changes are again seen of amputation of fifth ray to the mid shaft. The amputation margins are sharp. There is a tiny great toe metatarsophalangeal joint effusion. No marrow edema is seen. No cortical erosion is seen. There is increased T2 signal within the third and fourth toe phalanges on coronal T2 fat saturation images, however this is due to inhomogeneous fat saturation as normal signal is seen in these regions on the sagittal STIR images. Ligaments The Lisfranc ligament complex is intact. The metatarsophalangeal collateral  ligaments are intact. Muscles and Tendons There is moderate diffuse edema within the plantar midfoot musculature, nonspecific myositis. No tendon tear is visualized. Soft tissues There is mild-to-moderate lateral ankle and dorsal lateral midfoot MR mild medial ankle and dorsal medial midfoot subcutaneous fat edema and swelling. Reportedly there is a soft tissue ulcer in the region of the prior fifth digit amputation surgery. There is mild fluid and edema seen on axial images 20 through 23 chest dorsal lateral to the fourth metatarsal head. No abscess is seen. No cortical erosion is seen. IMPRESSION: 1. Postsurgical changes of remote amputation of the fifth ray to the mid shaft. Normal bone amputation site. 2. Reportedly there is an ulcer in the region of the prior fifth toe amputation site. No sinus tract, abscess, or osteomyelitis is seen. 3. Mild-to-moderate lateral ankle and dorsal lateral midfoot subcutaneous fat edema and swelling. 4. Moderate diffuse edema within the plantar midfoot musculature, nonspecific myositis. Electronically Signed   By: Neita Garnet M.D.   On: 05/18/2023 21:34               LOS: 1 day   Hasten Sweitzer  [facility]   Pager on [email or web address]. If 7PM-7AM, please contact night-coverage at [email or web address]     05/19/2023, 10:33 AM

## 2023-05-20 ENCOUNTER — Other Ambulatory Visit (INDEPENDENT_AMBULATORY_CARE_PROVIDER_SITE_OTHER): Payer: Self-pay | Admitting: Nurse Practitioner

## 2023-05-20 DIAGNOSIS — I70221 Atherosclerosis of native arteries of extremities with rest pain, right leg: Secondary | ICD-10-CM | POA: Diagnosis not present

## 2023-05-20 LAB — CBC
HCT: 37.5 % — ABNORMAL LOW (ref 39.0–52.0)
Hemoglobin: 12.7 g/dL — ABNORMAL LOW (ref 13.0–17.0)
MCH: 31.4 pg (ref 26.0–34.0)
MCHC: 33.9 g/dL (ref 30.0–36.0)
MCV: 92.6 fL (ref 80.0–100.0)
Platelets: 350 10*3/uL (ref 150–400)
RBC: 4.05 MIL/uL — ABNORMAL LOW (ref 4.22–5.81)
RDW: 13.2 % (ref 11.5–15.5)
WBC: 10.2 10*3/uL (ref 4.0–10.5)
nRBC: 0 % (ref 0.0–0.2)

## 2023-05-20 LAB — CREATININE, SERUM
Creatinine, Ser: 0.77 mg/dL (ref 0.61–1.24)
GFR, Estimated: >60 mL/min (ref >60)

## 2023-05-20 LAB — HEPARIN LEVEL (UNFRACTIONATED)
Heparin Unfractionated: 0.44 [IU]/mL (ref 0.30–0.70)
Heparin Unfractionated: 0.26 IU/mL — ABNORMAL LOW (ref 0.30–0.70)
Heparin Unfractionated: 0.51 IU/mL (ref 0.30–0.70)

## 2023-05-20 MED ORDER — HEPARIN BOLUS VIA INFUSION
1100.0000 [IU] | Freq: Once | INTRAVENOUS | Status: AC
  Administered 2023-05-20: 1100 [IU] via INTRAVENOUS
  Filled 2023-05-20: qty 1100

## 2023-05-20 NOTE — Plan of Care (Signed)
  Problem: Education: Goal: Knowledge of General Education information will improve Description: Including pain rating scale, medication(s)/side effects and non-pharmacologic comfort measures Outcome: Progressing   Problem: Nutrition: Goal: Adequate nutrition will be maintained Outcome: Progressing   Problem: Elimination: Goal: Will not experience complications related to bowel motility Outcome: Progressing Goal: Will not experience complications related to urinary retention Outcome: Progressing   Problem: Skin Integrity: Goal: Risk for impaired skin integrity will decrease Outcome: Progressing

## 2023-05-20 NOTE — TOC CM/SW Note (Signed)
Transition of Care [facility]) - Inpatient Brief Assessment   Patient Details  Name: Brendan Williams MRN: [id number] Date of Birth: 18-Nov-1960  [facility] CM/SW Contact:    Liliana Cline, LCSW Phone Number: 05/20/2023, 9:49 AM   Clinical Narrative:    Transition of Care Asessment: Insurance and Status: Insurance coverage has been reviewed Patient has primary care physician: Yes     Prior/Current Home Services: No current home services Social Determinants of Health Reivew: SDOH reviewed no interventions necessary Readmission risk has been reviewed: Yes Transition of care needs: no transition of care needs at this time

## 2023-05-20 NOTE — Consult Note (Signed)
Pharmacy Consult Note - Anticoagulation  Pharmacy Consult for heparin Indication: DVT  PATIENT MEASUREMENTS: Height: 6\' 1"  (185.4 cm) Weight: 78.2 kg (172 lb 6.4 oz) IBW/kg (Calculated) : 79.9 HEPARIN DW (KG): 78.2  VITAL SIGNS: Temp: 98.2 F (36.8 C) (08/04 1744) Temp Source: Oral (08/04 1123) BP: 111/86 (08/04 1744) Pulse Rate: 53 (08/04 1744)  Recent Labs    05/18/23 1253 05/18/23 2212 05/20/23 0411 05/20/23 1015 05/20/23 1840  HGB 14.6   < > 12.7*  --   --   HCT 43.7   < > 37.5*  --   --   PLT 410*   < > 350  --   --   APTT 40*  --   --   --   --   LABPROT 14.2  --   --   --   --   INR 1.1  --   --   --   --   HEPARINUNFRC  --    < > 0.44   < > 0.51  CREATININE 0.83   < > 0.77  --   --    < > = values in this interval not displayed.    Estimated Creatinine Clearance: 105.9 mL/min (by C-G formula based on SCr of 0.77 mg/dL).  PAST MEDICAL HISTORY: Past Medical History:  Diagnosis Date   Peripheral arterial disease [facility])     ASSESSMENT: 62 y.o. male with PMH PAD s/p left toe amputation, ischemic pain, neuropathy is presenting with right foot pain and swelling, reportedly turning black, concerning for limb ischemia and/or potential VTE. Patient had procedure scheduled with vascular for 8/5 but was urged to come to ED today due to foot reportedly turning black. Patient is not on chronic anticoagulation per chart review.  Pharmacy has been consulted to initiate and manage heparin intravenous infusion.  Pertinent medications: bASA daily No chronic anticoag PTA per chart review  Goal(s) of therapy: Heparin level 0.3 - 0.7 units/mL Monitor platelets by anticoagulation protocol: Yes   Baseline anticoagulation labs: Recent Labs    05/18/23 1253 05/19/23 0517 05/20/23 0411  APTT 40*  --   --   INR 1.1  --   --   HGB 14.6 13.1 12.7*  PLT 410* 369 350    Date Time aPTT/HL Rate/Comment 8/2       2212     < 0.1              1300 units/hr - gtt paused for  MRI 8/3       0517        0.14            1300 units/hr  8/3 1245      <0.10  Subtherapeutic,1600>1850 u/hr 8/3 2040      0.27 Subtherapeutic 1850>2000 u/hr 8/4       0411         0.44           Therapeutic X 1  8/4 1015      0.26 Subtherapeutic 2000>2150 u/hr 8/4 1840      0.51 Therapeutic x 1  PLAN: Heparin level therapeutic x 1 Continue heparin infusion at 2150 units/hr Recheck HL is 6 hours to confirm Daily CBC while on heparin   Barrie Folk, PharmD Clinical Pharmacist 05/20/2023 7:12 PM

## 2023-05-20 NOTE — Consult Note (Signed)
Pharmacy Consult Note - Anticoagulation  Pharmacy Consult for heparin Indication: DVT  PATIENT MEASUREMENTS: Height: 6\' 1"  (185.4 cm) Weight: 78.2 kg (172 lb 6.4 oz) IBW/kg (Calculated) : 79.9 HEPARIN DW (KG): 78.2  VITAL SIGNS: Temp: 98.1 F (36.7 C) (08/04 1123) Temp Source: Oral (08/04 1123) BP: 104/72 (08/04 1123) Pulse Rate: 53 (08/04 1123)  Recent Labs    05/18/23 1253 05/18/23 2212 05/20/23 0411 05/20/23 1015  HGB 14.6   < > 12.7*  --   HCT 43.7   < > 37.5*  --   PLT 410*   < > 350  --   APTT 40*  --   --   --   LABPROT 14.2  --   --   --   INR 1.1  --   --   --   HEPARINUNFRC  --    < > 0.44 0.26*  CREATININE 0.83   < > 0.77  --    < > = values in this interval not displayed.    Estimated Creatinine Clearance: 105.9 mL/min (by C-G formula based on SCr of 0.77 mg/dL).  PAST MEDICAL HISTORY: Past Medical History:  Diagnosis Date   Peripheral arterial disease [facility])     ASSESSMENT: 63 y.o. male with PMH PAD s/p left toe amputation, ischemic pain, neuropathy is presenting with right foot pain and swelling, reportedly turning black, concerning for limb ischemia and/or potential VTE. Patient had procedure scheduled with vascular for 8/5 but was urged to come to ED today due to foot reportedly turning black. Patient is not on chronic anticoagulation per chart review.  Pharmacy has been consulted to initiate and manage heparin intravenous infusion.  Pertinent medications: bASA daily No chronic anticoag PTA per chart review  Goal(s) of therapy: Heparin level 0.3 - 0.7 units/mL Monitor platelets by anticoagulation protocol: Yes   Baseline anticoagulation labs: Recent Labs    05/18/23 1253 05/19/23 0517 05/20/23 0411  APTT 40*  --   --   INR 1.1  --   --   HGB 14.6 13.1 12.7*  PLT 410* 369 350    Date Time aPTT/HL Rate/Comment 8/2       2212     < 0.1              1300 units/hr - gtt paused for MRI 8/3       0517        0.14            1300 units/hr   8/3 1245      <0.10  Subtherapeutic,1600>1850 u/hr 8/3 2040      0.27 Subtherapeutic 1850>2000 u/hr 8/4       0411         0.44           Therapeutic X 1  8/4 1015      0.26 Subtherapeutic 2000>2150 u/hr  PLAN: 8/4  1015  HL= 0.26 subtherapeutic - Will bolus 1100 units x 1 and increase heparin drip to 2150 units/hr  and recheck HL in 6 hrs Daily CBC while on heparin   Bexleigh Theriault A Clinical Pharmacist 05/20/2023 11:59 AM

## 2023-05-20 NOTE — Plan of Care (Signed)
  Problem: Education: Goal: Knowledge of General Education information will improve Description: Including pain rating scale, medication(s)/side effects and non-pharmacologic comfort measures Outcome: Progressing   Problem: Clinical Measurements: Goal: Respiratory complications will improve Outcome: Progressing   Problem: Clinical Measurements: Goal: Cardiovascular complication will be avoided Outcome: Progressing   Problem: Nutrition: Goal: Adequate nutrition will be maintained Outcome: Progressing   Problem: Pain Managment: Goal: General experience of comfort will improve Outcome: Progressing   Problem: Safety: Goal: Ability to remain free from injury will improve Outcome: Progressing   

## 2023-05-20 NOTE — Progress Notes (Signed)
Progress Note    Brendan Williams  JWJ:[id number] DOB: 01-Aug-1961  DOA: 05/18/2023 PCP: Gildardo Pounds, PA      Brief Narrative:    Medical records reviewed and are as summarized below:  Brendan Williams is a 62 y.o. male with medical history significant of severe PAD s/p stenting (Most recent April 2023), tobacco use disorder, neuropathy, who presented to the ED due to right foot pain and redness.  He had been experiencing pain in the right foot since he recently saw his vascular surgeon.  However, pain in the right foot had progressively worsened about 3 to 4 days prior to admission.  Noticed purplish/redness discoloration on the dorsal aspect of the right foot and toes.  He also endorsed fever at home.   Workup revealed right foot cellulitis and critical right limb ischemia.      Assessment/Plan:   Principal Problem:   Critical limb ischemia of right lower extremity (HCC) Active Problems:   Tobacco use disorder   Cellulitis   Cellulitis and abscess of toe of right foot    Body mass index is 22.75 kg/m.   PVD, critical limb ischemia of right lower extremity: Continue IV heparin drip.  Monitor heparin level per protocol.  Continue aspirin and Lipitor.  Plan for right lower extremity angiogram tomorrow.  Follow-up with vascular surgeon.   Right foot cellulitis: Continue IV ceftriaxone and vancomycin.  No growth on blood cultures thus far.   Hypotension: Blood pressure is better.  He is asymptomatic.   Tobacco use disorder: Counseled to quit smoking cigarettes.   Diet Order             Diet regular Fluid consistency: Thin  Diet effective now                            Consultants: Vascular surgeon, podiatrist  Procedures: None    Medications:    aspirin EC  81 mg Oral Daily   atorvastatin  40 mg Oral Daily   sodium chloride flush  3 mL Intravenous Q12H   Continuous Infusions:  cefTRIAXone (ROCEPHIN)  IV 1 g (05/19/23 1159)    heparin 2,000 Units/hr (05/20/23 0555)   vancomycin 1,250 mg (05/20/23 0459)     Anti-infectives (From admission, onward)    Start     Dose/Rate Route Frequency Ordered Stop   05/19/23 1200  cefTRIAXone (ROCEPHIN) 1 g in sodium chloride 0.9 % 100 mL IVPB        1 g 200 mL/hr over 30 Minutes Intravenous Every 24 hours 05/18/23 1459 05/26/23 1159   05/19/23 0400  vancomycin (VANCOREADY) IVPB 1250 mg/250 mL        1,250 mg 166.7 mL/hr over 90 Minutes Intravenous Every 12 hours 05/18/23 1542     05/18/23 1500  vancomycin (VANCOREADY) IVPB 1750 mg/350 mL        1,750 mg 175 mL/hr over 120 Minutes Intravenous  Once 05/18/23 1414 05/18/23 1758   05/18/23 1400  vancomycin (VANCOCIN) IVPB 1000 mg/200 mL premix  Status:  Discontinued        1,000 mg 200 mL/hr over 60 Minutes Intravenous  Once 05/18/23 1349 05/18/23 1414   05/18/23 1400  cefTRIAXone (ROCEPHIN) 2 g in sodium chloride 0.9 % 100 mL IVPB        2 g 200 mL/hr over 30 Minutes Intravenous  Once 05/18/23 1349 05/18/23 1554  Family Communication/Anticipated D/C date and plan/Code Status   DVT prophylaxis:      Code Status: Full Code  Family Communication: Plan discussed with his wife at the bedside Disposition Plan: Plan to discharge home when medically stable   Status is: Inpatient Remains inpatient appropriate because: Critical right limb ischemia, right foot cellulitis       Subjective:   Interval events noted.  He still has pain in the right foot but said it is better today.  His wife was at the bedside.  Objective:    Vitals:   05/19/23 1957 05/19/23 2345 05/20/23 0413 05/20/23 1123  BP: 91/62 103/70 (!) 116/91 104/72  Pulse: (!) 56 (!) 54 (!) 59 (!) 53  Resp: 18 18 18 16   Temp: 98.1 F (36.7 C) 97.9 F (36.6 C) 98 F (36.7 C) 98.1 F (36.7 C)  TempSrc:    Oral  SpO2: 97% 98% 100% 100%  Weight:      Height:       No data found.   Intake/Output Summary (Last 24 hours) at  05/20/2023 1145 Last data filed at 05/20/2023 0954 Gross per 24 hour  Intake 1808.46 ml  Output 1050 ml  Net 758.46 ml   Filed Weights   05/18/23 1251  Weight: 78.2 kg    Exam:  GEN: NAD SKIN: Warm and dry EYES: EOMI ENT: MMM CV: RRR PULM: CTA B ABD: soft, ND, NT, +BS CNS: AAO x 3, non focal EXT: There is less redness and tenderness of the right foot.  There is purple discoloration of the right foot and right toes.      Data Reviewed:   I have personally reviewed following labs and imaging studies:  Labs: Labs show the following:   Basic Metabolic Panel: Recent Labs  Lab 05/18/23 1253 05/19/23 0517 05/20/23 0411  NA 131* 137  --   K 3.8 3.6  --   CL 94* 105  --   CO2 27 23  --   GLUCOSE 99 96  --   BUN 8 6*  --   CREATININE 0.83 0.65 0.77  CALCIUM 9.0 8.4*  --    GFR Estimated Creatinine Clearance: 105.9 mL/min (by C-G formula based on SCr of 0.77 mg/dL). Liver Function Tests: Recent Labs  Lab 05/18/23 1253 05/19/23 0517  AST 16 13*  ALT 11 8  ALKPHOS 67 54  BILITOT 0.7 0.5  PROT 8.0 6.3*  ALBUMIN 3.7 2.9*   No results for input(s): "LIPASE", "AMYLASE" in the last 168 hours. No results for input(s): "AMMONIA" in the last 168 hours. Coagulation profile Recent Labs  Lab 05/18/23 1253  INR 1.1    CBC: Recent Labs  Lab 05/18/23 1253 05/19/23 0517 05/20/23 0411  WBC 13.6* 11.4* 10.2  HGB 14.6 13.1 12.7*  HCT 43.7 39.4 37.5*  MCV 93.6 95.4 92.6  PLT 410* 369 350   Cardiac Enzymes: No results for input(s): "CKTOTAL", "CKMB", "CKMBINDEX", "TROPONINI" in the last 168 hours. BNP (last 3 results) No results for input(s): "PROBNP" in the last 8760 hours. CBG: No results for input(s): "GLUCAP" in the last 168 hours. D-Dimer: No results for input(s): "DDIMER" in the last 72 hours. Hgb A1c: No results for input(s): "HGBA1C" in the last 72 hours. Lipid Profile: Recent Labs    05/19/23 0517  CHOL 78  HDL 35*  LDLCALC 27  TRIG 79   CHOLHDL 2.2   Thyroid function studies: No results for input(s): "TSH", "T4TOTAL", "T3FREE", "THYROIDAB" in the last 72  hours.  Invalid input(s): "FREET3" Anemia work up: No results for input(s): "VITAMINB12", "FOLATE", "FERRITIN", "TIBC", "IRON", "RETICCTPCT" in the last 72 hours. Sepsis Labs: Recent Labs  Lab 05/18/23 1253 05/19/23 0517 05/20/23 0411  WBC 13.6* 11.4* 10.2    Microbiology Recent Results (from the past 240 hour(s))  Culture, blood (single)     Status: None (Preliminary result)   Collection Time: 05/18/23  3:15 PM   Specimen: BLOOD LEFT ARM  Result Value Ref Range Status   Specimen Description BLOOD LEFT ARM  Final   Special Requests   Final    BOTTLES DRAWN AEROBIC AND ANAEROBIC Blood Culture adequate volume   Culture   Final    NO GROWTH 2 DAYS Performed at [facility], [address], [place], [place] [address]    Report Status PENDING  Incomplete    Procedures and diagnostic studies:  MR FOOT RIGHT W WO CONTRAST  Result Date: 05/18/2023 CLINICAL DATA:  Soft tissue infection suspected. Osteonecrosis suspected. Entire foot pain, swelling, and redness. History of fifth ray amputation. Ulcer in this region. EXAM: MRI OF THE RIGHT FOREFOOT WITHOUT AND WITH CONTRAST TECHNIQUE: Multiplanar, multisequence MR imaging of the right forefoot was performed before and after the administration of intravenous contrast. CONTRAST:  7.9mL GADAVIST GADOBUTROL 1 MMOL/ML IV SOLN COMPARISON:  Right foot radiographs 12/02/2019 and 12/01/2019 FINDINGS: Bones/Joint/Cartilage Postsurgical changes are again seen of amputation of fifth ray to the mid shaft. The amputation margins are sharp. There is a tiny great toe metatarsophalangeal joint effusion. No marrow edema is seen. No cortical erosion is seen. There is increased T2 signal within the third and fourth toe phalanges on coronal T2 fat saturation images, however this is due to inhomogeneous fat saturation as normal  signal is seen in these regions on the sagittal STIR images. Ligaments The Lisfranc ligament complex is intact. The metatarsophalangeal collateral ligaments are intact. Muscles and Tendons There is moderate diffuse edema within the plantar midfoot musculature, nonspecific myositis. No tendon tear is visualized. Soft tissues There is mild-to-moderate lateral ankle and dorsal lateral midfoot MR mild medial ankle and dorsal medial midfoot subcutaneous fat edema and swelling. Reportedly there is a soft tissue ulcer in the region of the prior fifth digit amputation surgery. There is mild fluid and edema seen on axial images 20 through 23 chest dorsal lateral to the fourth metatarsal head. No abscess is seen. No cortical erosion is seen. IMPRESSION: 1. Postsurgical changes of remote amputation of the fifth ray to the mid shaft. Normal bone amputation site. 2. Reportedly there is an ulcer in the region of the prior fifth toe amputation site. No sinus tract, abscess, or osteomyelitis is seen. 3. Mild-to-moderate lateral ankle and dorsal lateral midfoot subcutaneous fat edema and swelling. 4. Moderate diffuse edema within the plantar midfoot musculature, nonspecific myositis. Electronically Signed   By: Neita Garnet M.D.   On: 05/18/2023 21:34               LOS: 2 days   Gracen Ringwald  [facility]   Pager on [email or web address]. If 7PM-7AM, please contact night-coverage at [email or web address]     05/20/2023, 11:45 AM

## 2023-05-20 NOTE — Consult Note (Signed)
Pharmacy Consult Note - Anticoagulation  Pharmacy Consult for heparin Indication: DVT  PATIENT MEASUREMENTS: Height: 6\' 1"  (185.4 cm) Weight: 78.2 kg (172 lb 6.4 oz) IBW/kg (Calculated) : 79.9 HEPARIN DW (KG): 78.2  VITAL SIGNS: Temp: 98 F (36.7 C) (08/04 0413) BP: 116/91 (08/04 0413) Pulse Rate: 59 (08/04 0413)  Recent Labs    05/18/23 1253 05/18/23 2212 05/20/23 0411  HGB 14.6   < > 12.7*  HCT 43.7   < > 37.5*  PLT 410*   < > 350  APTT 40*  --   --   LABPROT 14.2  --   --   INR 1.1  --   --   HEPARINUNFRC  --    < > 0.44  CREATININE 0.83   < > 0.77   < > = values in this interval not displayed.    Estimated Creatinine Clearance: 105.9 mL/min (by C-G formula based on SCr of 0.77 mg/dL).  PAST MEDICAL HISTORY: Past Medical History:  Diagnosis Date   Peripheral arterial disease [facility])     ASSESSMENT: 62 y.o. male with PMH PAD s/p left toe amputation, ischemic pain, neuropathy is presenting with right foot pain and swelling, reportedly turning black, concerning for limb ischemia and/or potential VTE. Patient had procedure scheduled with vascular for 8/5 but was urged to come to ED today due to foot reportedly turning black. Patient is not on chronic anticoagulation per chart review.  Pharmacy has been consulted to initiate and manage heparin intravenous infusion.  Pertinent medications: bASA daily No chronic anticoag PTA per chart review  Goal(s) of therapy: Heparin level 0.3 - 0.7 units/mL Monitor platelets by anticoagulation protocol: Yes   Baseline anticoagulation labs: Recent Labs    05/18/23 1253 05/19/23 0517 05/20/23 0411  APTT 40*  --   --   INR 1.1  --   --   HGB 14.6 13.1 12.7*  PLT 410* 369 350    Date Time aPTT/HL Rate/Comment 8/2       2212     < 0.1              1300 units/hr - gtt paused for MRI 8/3       0517        0.14            1300 units/hr  8/3 1245      <0.10  Subtherapeutic,1600>1850 u/hr 8/3 2040      0.27 Subtherapeutic @  1850 u/hr 8/4       0411         0.44           Therapeutic X 1   PLAN: 8/4:  HL @ 0411 = 0.44, therapeutic X 1 - Will continue pt on current rate and recheck HL in 6 hrs on 8/4 @ 1000.  Daily CBC while on heparin   Simran Bomkamp D Clinical Pharmacist 05/20/2023 5:08 AM

## 2023-05-21 ENCOUNTER — Inpatient Hospital Stay: Payer: Medicaid Other | Admitting: Anesthesiology

## 2023-05-21 ENCOUNTER — Ambulatory Visit: Admission: RE | Admit: 2023-05-21 | Payer: Medicaid Other | Source: Ambulatory Visit | Admitting: Vascular Surgery

## 2023-05-21 ENCOUNTER — Other Ambulatory Visit (INDEPENDENT_AMBULATORY_CARE_PROVIDER_SITE_OTHER): Payer: Self-pay | Admitting: Nurse Practitioner

## 2023-05-21 ENCOUNTER — Encounter
Admission: EM | Disposition: A | Discharge status: Home / Self Care | Payer: Self-pay | Source: Home / Self Care | Attending: Internal Medicine

## 2023-05-21 DIAGNOSIS — I70221 Atherosclerosis of native arteries of extremities with rest pain, right leg: Secondary | ICD-10-CM | POA: Diagnosis not present

## 2023-05-21 DIAGNOSIS — I70229 Atherosclerosis of native arteries of extremities with rest pain, unspecified extremity: Secondary | ICD-10-CM

## 2023-05-21 HISTORY — PX: LOWER EXTREMITY ANGIOGRAPHY: CATH118251

## 2023-05-21 LAB — GLUCOSE, CAPILLARY: Glucose-Capillary: 99 mg/dL (ref 70–99)

## 2023-05-21 SURGERY — LOWER EXTREMITY ANGIOGRAPHY
Anesthesia: General | Site: Leg Lower | Laterality: Right

## 2023-05-21 MED ORDER — CEFAZOLIN SODIUM-DEXTROSE 2-4 GM/100ML-% IV SOLN: 2.0000 g | INTRAVENOUS | Status: DC

## 2023-05-21 MED ORDER — FENTANYL CITRATE (PF) 100 MCG/2ML IJ SOLN
INTRAMUSCULAR | Status: AC
  Filled 2023-05-21: qty 2

## 2023-05-21 MED ORDER — CEFAZOLIN SODIUM-DEXTROSE 2-4 GM/100ML-% IV SOLN
INTRAVENOUS | Status: AC
  Filled 2023-05-21: qty 100

## 2023-05-21 MED ORDER — ONDANSETRON HCL 4 MG/2ML IJ SOLN: 4.0000 mg | Freq: Four times a day (QID) | INTRAMUSCULAR | Status: DC | PRN

## 2023-05-21 MED ORDER — PROPOFOL 500 MG/50ML IV EMUL
INTRAVENOUS | Status: DC | PRN
  Administered 2023-05-21: 140 ug/kg/min via INTRAVENOUS

## 2023-05-21 MED ORDER — METHYLPREDNISOLONE SODIUM SUCC 125 MG IJ SOLR: 125.0000 mg | Freq: Once | INTRAMUSCULAR | Status: DC | PRN

## 2023-05-21 MED ORDER — SODIUM CHLORIDE 0.9 % IV SOLN: INTRAVENOUS | Status: DC

## 2023-05-21 MED ORDER — HEPARIN (PORCINE) IN NACL 2000-0.9 UNIT/L-% IV SOLN
INTRAVENOUS | Status: DC | PRN
  Administered 2023-05-21: 1000 mL

## 2023-05-21 MED ORDER — KETAMINE HCL 50 MG/5ML IJ SOSY
PREFILLED_SYRINGE | INTRAMUSCULAR | Status: AC
  Filled 2023-05-21: qty 5

## 2023-05-21 MED ORDER — DIPHENHYDRAMINE HCL 50 MG/ML IJ SOLN: 50.0000 mg | Freq: Once | INTRAMUSCULAR | Status: DC | PRN

## 2023-05-21 MED ORDER — HYDROMORPHONE HCL 1 MG/ML IJ SOLN
INTRAMUSCULAR | Status: AC
  Filled 2023-05-21: qty 1

## 2023-05-21 MED ORDER — FENTANYL CITRATE (PF) 100 MCG/2ML IJ SOLN
INTRAMUSCULAR | Status: DC | PRN
  Administered 2023-05-21 (×5): 25 ug via INTRAVENOUS

## 2023-05-21 MED ORDER — MIDAZOLAM HCL 2 MG/2ML IJ SOLN
INTRAMUSCULAR | Status: AC
  Filled 2023-05-21: qty 2

## 2023-05-21 MED ORDER — KETAMINE HCL 10 MG/ML IJ SOLN
INTRAMUSCULAR | Status: DC | PRN
  Administered 2023-05-21: 30 mg via INTRAVENOUS
  Administered 2023-05-21: 20 mg via INTRAVENOUS

## 2023-05-21 MED ORDER — IODIXANOL 320 MG/ML IV SOLN
INTRAVENOUS | Status: DC | PRN
  Administered 2023-05-21: 55 mL via INTRA_ARTERIAL

## 2023-05-21 MED ORDER — PROPOFOL 1000 MG/100ML IV EMUL
INTRAVENOUS | Status: AC
  Filled 2023-05-21: qty 100

## 2023-05-21 MED ORDER — MIDAZOLAM HCL 2 MG/ML PO SYRP: 8.0000 mg | ORAL_SOLUTION | Freq: Once | ORAL | Status: DC | PRN

## 2023-05-21 MED ORDER — FAMOTIDINE 20 MG PO TABS: 40.0000 mg | ORAL_TABLET | Freq: Once | ORAL | Status: DC | PRN

## 2023-05-21 MED ORDER — PROPOFOL 10 MG/ML IV BOLUS
INTRAVENOUS | Status: AC
  Filled 2023-05-21: qty 20

## 2023-05-21 MED ORDER — HYDROMORPHONE HCL 1 MG/ML IJ SOLN: 1.0000 mg | Freq: Once | INTRAMUSCULAR | Status: DC | PRN

## 2023-05-21 SURGICAL SUPPLY — 16 items
CATH ANGIO 5F PIGTAIL 65CM (CATHETERS) IMPLANT
COVER EZ STRL 42X30 (DRAPES) IMPLANT
COVER PROBE U/S 5X48 (MISCELLANEOUS) IMPLANT
DEVICE STARCLOSE SE CLOSURE (Vascular Products) IMPLANT
DRAPE FEMORAL ANGIO W/ POUCH (DRAPES) IMPLANT
DRAPE INCISE IOBAN 66X45 STRL (DRAPES) IMPLANT
GLIDEWIRE ADV .035X260CM (WIRE) IMPLANT
KIT MICROPUNCTURE NIT STIFF (SHEATH) IMPLANT
NDL ENTRY 21GA 7CM ECHOTIP (NEEDLE) IMPLANT
NEEDLE ENTRY 21GA 7CM ECHOTIP (NEEDLE) ×1 IMPLANT
PACK ANGIOGRAPHY (CUSTOM PROCEDURE TRAY) ×1 IMPLANT
SHEATH BRITE TIP 5FRX11 (SHEATH) IMPLANT
SHEATH MICROPUNCTURE PEDAL 4FR (SHEATH) IMPLANT
SYR MEDRAD MARK 7 150ML (SYRINGE) IMPLANT
TUBING CONTRAST HIGH PRESS 72 (TUBING) IMPLANT
WIRE GUIDERIGHT .035X150 (WIRE) IMPLANT

## 2023-05-21 NOTE — Anesthesia Postprocedure Evaluation (Signed)
Anesthesia Post Note  Patient: Brendan Williams  Procedure(s) Performed: Lower Extremity Angiography (Right: Leg Lower)  Patient location during evaluation: PACU Anesthesia Type: General Level of consciousness: awake and alert Pain management: pain level controlled Vital Signs Assessment: post-procedure vital signs reviewed and stable Respiratory status: spontaneous breathing, nonlabored ventilation and respiratory function stable Cardiovascular status: blood pressure returned to baseline and stable Postop Assessment: no apparent nausea or vomiting Anesthetic complications: no   No notable events documented.   Last Vitals:  Vitals:   05/21/23 1500 05/21/23 1515  BP: 112/86 115/83  Pulse: 75 60  Resp: 18 17  Temp:    SpO2: 94% 95%    Last Pain:  Vitals:   05/21/23 1515  TempSrc:   PainSc: 7                  Foye Deer

## 2023-05-21 NOTE — Transfer of Care (Signed)
Immediate Anesthesia Transfer of Care Note  Patient: Brendan Williams  Procedure(s) Performed: Lower Extremity Angiography (Right: Leg Lower)  Patient Location: PACU  Anesthesia Type:General  Level of Consciousness: awake and alert   Airway & Oxygen Therapy: Patient Spontanous Breathing and Patient connected to nasal cannula oxygen  Post-op Assessment: Report given to RN and Post -op Vital signs reviewed and stable  Post vital signs: Reviewed and stable  Last Vitals:  Vitals Value Taken Time  BP    Temp    Pulse    Resp    SpO2      Last Pain:  Vitals:   05/21/23 1244  TempSrc: Oral  PainSc: 8       Patients Stated Pain Goal: 0 (05/21/23 1244)  Complications: No notable events documented.

## 2023-05-21 NOTE — Consult Note (Signed)
Pharmacy Consult Note - Anticoagulation  Pharmacy Consult for heparin Indication: DVT  PATIENT MEASUREMENTS: Height: 6\' 1"  (185.4 cm) Weight: 78.2 kg (172 lb 6.4 oz) IBW/kg (Calculated) : 79.9 HEPARIN DW (KG): 78.2  VITAL SIGNS: Temp: 98.9 F (37.2 C) (08/05 0000) Temp Source: Oral (08/05 0000) BP: 91/64 (08/05 0000) Pulse Rate: 64 (08/04 2046)  Recent Labs    05/18/23 1253 05/18/23 2212 05/20/23 0411 05/20/23 1015 05/21/23 0058  HGB 14.6   < > 12.7*  --   --   HCT 43.7   < > 37.5*  --   --   PLT 410*   < > 350  --   --   APTT 40*  --   --   --   --   LABPROT 14.2  --   --   --   --   INR 1.1  --   --   --   --   HEPARINUNFRC  --    < > 0.44   < > 0.60  CREATININE 0.83   < > 0.77  --   --    < > = values in this interval not displayed.    Estimated Creatinine Clearance: 105.9 mL/min (by C-G formula based on SCr of 0.77 mg/dL).  PAST MEDICAL HISTORY: Past Medical History:  Diagnosis Date   Peripheral arterial disease [facility])     ASSESSMENT: 62 y.o. male with PMH PAD s/p left toe amputation, ischemic pain, neuropathy is presenting with right foot pain and swelling, reportedly turning black, concerning for limb ischemia and/or potential VTE. Patient had procedure scheduled with vascular for 8/5 but was urged to come to ED today due to foot reportedly turning black. Patient is not on chronic anticoagulation per chart review.  Pharmacy has been consulted to initiate and manage heparin intravenous infusion.  Pertinent medications: bASA daily No chronic anticoag PTA per chart review  Goal(s) of therapy: Heparin level 0.3 - 0.7 units/mL Monitor platelets by anticoagulation protocol: Yes   Baseline anticoagulation labs: Recent Labs    05/18/23 1253 05/19/23 0517 05/20/23 0411  APTT 40*  --   --   INR 1.1  --   --   HGB 14.6 13.1 12.7*  PLT 410* 369 350    Date Time aPTT/HL Rate/Comment 8/2       2212     < 0.1              1300 units/hr - gtt paused for  MRI 8/3       0517        0.14            1300 units/hr  8/3 1245      <0.10  Subtherapeutic,1600>1850 u/hr 8/3 2040      0.27 Subtherapeutic 1850>2000 u/hr 8/4       0411         0.44           Therapeutic X 1  8/4 1015      0.26 Subtherapeutic 2000>2150 u/hr 8/4 1840      0.51 Therapeutic x 1 8/5       0058         0.60           Therapeutic X 2   PLAN: Heparin level therapeutic x 2 Continue heparin infusion at 2150 units/hr Recheck HL on 8/6 with AM labs.  Daily CBC while on heparin   Donie Moulton D, PharmD Clinical Pharmacist 05/21/2023 2:11 AM

## 2023-05-21 NOTE — Progress Notes (Signed)
Progress Note    Brendan Williams  ZOX:[id number] DOB: 14-Sep-1961  DOA: 05/18/2023 PCP: Gildardo Pounds, PA      Brief Narrative:    Medical records reviewed and are as summarized below:  Brendan Williams is a 62 y.o. male with medical history significant of severe PAD s/p stenting (Most recent April 2023), tobacco use disorder, neuropathy, who presented to the ED due to right foot pain and redness.  He had been experiencing pain in the right foot since he recently saw his vascular surgeon.  However, pain in the right foot had progressively worsened about 3 to 4 days prior to admission.  Noticed purplish/redness discoloration on the dorsal aspect of the right foot and toes.  He also endorsed fever at home.   Workup revealed right foot cellulitis and critical right limb ischemia.      Assessment/Plan:   Principal Problem:   Critical limb ischemia of right lower extremity (HCC) Active Problems:   Tobacco use disorder   Cellulitis   Cellulitis and abscess of toe of right foot    Body mass index is 22.75 kg/m.   PVD, critical limb ischemia of right lower extremity: He is on IV heparin drip. Monitor heparin level per protocol.  Continue aspirin and Lipitor. Plan for right lower extremity angiogram today.  Follow-up with vascular surgeon.   Right foot cellulitis: Continue IV ceftriaxone and vancomycin.  No growth on blood cultures thus far.   Hypotension: Improved   Tobacco use disorder: Counseled to quit smoking cigarettes.   Diet Order             Diet NPO time specified  Diet effective now                            Consultants: Vascular surgeon, podiatrist  Procedures: None    Medications:    aspirin EC  81 mg Oral Daily   atorvastatin  40 mg Oral Daily   sodium chloride flush  3 mL Intravenous Q12H   Continuous Infusions:  sodium chloride 75 mL/hr at 05/21/23 0423   cefTRIAXone (ROCEPHIN)  IV 1 g (05/20/23 1205)   heparin  2,150 Units/hr (05/21/23 0717)   vancomycin 1,250 mg (05/21/23 0424)     Anti-infectives (From admission, onward)    Start     Dose/Rate Route Frequency Ordered Stop   05/21/23 0407  ceFAZolin (ANCEF) IVPB 2g/100 mL premix  Status:  Discontinued        2 g 200 mL/hr over 30 Minutes Intravenous 30 min pre-op 05/21/23 0407 05/21/23 0734   05/19/23 1200  cefTRIAXone (ROCEPHIN) 1 g in sodium chloride 0.9 % 100 mL IVPB        1 g 200 mL/hr over 30 Minutes Intravenous Every 24 hours 05/18/23 1459 05/26/23 1159   05/19/23 0400  vancomycin (VANCOREADY) IVPB 1250 mg/250 mL        1,250 mg 166.7 mL/hr over 90 Minutes Intravenous Every 12 hours 05/18/23 1542     05/18/23 1500  vancomycin (VANCOREADY) IVPB 1750 mg/350 mL        1,750 mg 175 mL/hr over 120 Minutes Intravenous  Once 05/18/23 1414 05/18/23 1758   05/18/23 1400  vancomycin (VANCOCIN) IVPB 1000 mg/200 mL premix  Status:  Discontinued        1,000 mg 200 mL/hr over 60 Minutes Intravenous  Once 05/18/23 1349 05/18/23 1414   05/18/23 1400  cefTRIAXone (ROCEPHIN) 2  g in sodium chloride 0.9 % 100 mL IVPB        2 g 200 mL/hr over 30 Minutes Intravenous  Once 05/18/23 1349 05/18/23 1554              Family Communication/Anticipated D/C date and plan/Code Status   DVT prophylaxis:      Code Status: Full Code  Family Communication: Plan discussed with his wife at the bedside Disposition Plan: Plan to discharge home when medically stable   Status is: Inpatient Remains inpatient appropriate because: Critical right limb ischemia, right foot cellulitis       Subjective:   No acute events overnight.  Pain in the right foot is a little better.  His wife is at the bedside.  Objective:    Vitals:   05/20/23 1744 05/20/23 2046 05/21/23 0000 05/21/23 0413  BP: 111/86 (!) 105/90 91/64 105/63  Pulse: (!) 53 64  61  Resp: 19 20 20    Temp: 98.2 F (36.8 C) 98 F (36.7 C) 98.9 F (37.2 C) 98.1 F (36.7 C)  TempSrc:    Oral Oral  SpO2: 99% 100% 100% 99%  Weight:      Height:       No data found.   Intake/Output Summary (Last 24 hours) at 05/21/2023 0836 Last data filed at 05/21/2023 0451 Gross per 24 hour  Intake 1055.15 ml  Output 550 ml  Net 505.15 ml   Filed Weights   05/18/23 1251  Weight: 78.2 kg    Exam:   GEN: NAD SKIN: Warm and dry EYES: No pallor or icterus ENT: MMM CV: RRR PULM: CTA B ABD: soft, ND, NT, +BS CNS: AAO x 3, non focal EXT: Purplish discoloration of right foot and toes.  Tenderness of the right foot.   Data Reviewed:   I have personally reviewed following labs and imaging studies:  Labs: Labs show the following:   Basic Metabolic Panel: Recent Labs  Lab 05/18/23 1253 05/19/23 0517 05/20/23 0411  NA 131* 137  --   K 3.8 3.6  --   CL 94* 105  --   CO2 27 23  --   GLUCOSE 99 96  --   BUN 8 6*  --   CREATININE 0.83 0.65 0.77  CALCIUM 9.0 8.4*  --    GFR Estimated Creatinine Clearance: 105.9 mL/min (by C-G formula based on SCr of 0.77 mg/dL). Liver Function Tests: Recent Labs  Lab 05/18/23 1253 05/19/23 0517  AST 16 13*  ALT 11 8  ALKPHOS 67 54  BILITOT 0.7 0.5  PROT 8.0 6.3*  ALBUMIN 3.7 2.9*   No results for input(s): "LIPASE", "AMYLASE" in the last 168 hours. No results for input(s): "AMMONIA" in the last 168 hours. Coagulation profile Recent Labs  Lab 05/18/23 1253  INR 1.1    CBC: Recent Labs  Lab 05/18/23 1253 05/19/23 0517 05/20/23 0411 05/21/23 0547  WBC 13.6* 11.4* 10.2 9.6  HGB 14.6 13.1 12.7* 12.7*  HCT 43.7 39.4 37.5* 37.7*  MCV 93.6 95.4 92.6 92.9  PLT 410* 369 350 379   Cardiac Enzymes: No results for input(s): "CKTOTAL", "CKMB", "CKMBINDEX", "TROPONINI" in the last 168 hours. BNP (last 3 results) No results for input(s): "PROBNP" in the last 8760 hours. CBG: Recent Labs  Lab 05/21/23 0455  GLUCAP 99   D-Dimer: No results for input(s): "DDIMER" in the last 72 hours. Hgb A1c: No results for input(s):  "HGBA1C" in the last 72 hours. Lipid Profile: Recent Labs  05/19/23 0517  CHOL 78  HDL 35*  LDLCALC 27  TRIG 79  CHOLHDL 2.2   Thyroid function studies: No results for input(s): "TSH", "T4TOTAL", "T3FREE", "THYROIDAB" in the last 72 hours.  Invalid input(s): "FREET3" Anemia work up: No results for input(s): "VITAMINB12", "FOLATE", "FERRITIN", "TIBC", "IRON", "RETICCTPCT" in the last 72 hours. Sepsis Labs: Recent Labs  Lab 05/18/23 1253 05/19/23 0517 05/20/23 0411 05/21/23 0547  WBC 13.6* 11.4* 10.2 9.6    Microbiology Recent Results (from the past 240 hour(s))  Culture, blood (single)     Status: None (Preliminary result)   Collection Time: 05/18/23  3:15 PM   Specimen: BLOOD LEFT ARM  Result Value Ref Range Status   Specimen Description BLOOD LEFT ARM  Final   Special Requests   Final    BOTTLES DRAWN AEROBIC AND ANAEROBIC Blood Culture adequate volume   Culture   Final    NO GROWTH 3 DAYS Performed at [facility], [address] [place], [place] [address]    Report Status PENDING  Incomplete    Procedures and diagnostic studies:  No results found.             LOS: 3 days   Petra Sargeant  [facility]   Pager on [email or web address]. If 7PM-7AM, please contact night-coverage at [email or web address]     05/21/2023, 8:36 AM

## 2023-05-21 NOTE — Anesthesia Preprocedure Evaluation (Addendum)
Anesthesia Evaluation  Patient identified by MRN, date of birth, ID band Patient awake    Reviewed: Allergy & Precautions, H&P , NPO status , Patient's Chart, lab work & pertinent test results  Airway Mallampati: II  TM Distance: >3 FB Neck ROM: full    Dental  (+) Missing   Pulmonary Current Smoker and Patient abstained from smoking.   Pulmonary exam normal        Cardiovascular + Peripheral Vascular Disease  Normal cardiovascular exam     Neuro/Psych  Neuromuscular disease (neuropathy)  negative psych ROS   GI/Hepatic negative GI ROS,,,(+)     substance abuse  alcohol use and marijuana use  Endo/Other  negative endocrine ROS    Renal/GU negative Renal ROS  negative genitourinary   Musculoskeletal   Abdominal   Peds  Hematology  (+) Blood dyscrasia, anemia   Anesthesia Other Findings Critical limb ischemia of right lower extremity  Past Medical History: No date: Peripheral arterial disease (HCC)  Past Surgical History: 12/02/2019: AMPUTATION TOE; Right     Comment:  Procedure: AMPUTATION RIGHT 5TH TOE;  Surgeon: Rosetta Posner, DPM;  Location: [facility];  Service: Podiatry;                Laterality: Right; 11/28/2019: APPLICATION OF WOUND VAC; Bilateral     Comment:  Procedure: APPLICATION OF WOUND VAC;  Surgeon: Annice Needy, MD;  Location: [facility];  Service: Vascular;                Laterality: Bilateral;  Prevena  No date: BACK SURGERY     Comment:  lower ruptured disk 11/28/2019: CENTRAL VENOUS CATHETER INSERTION     Comment:  Procedure: INSERTION CENTRAL LINE ADULT;  Surgeon: Annice Needy, MD;  Location: [facility];  Service: Vascular;; 11/28/2019: ENDARTERECTOMY FEMORAL; Bilateral     Comment:  Procedure: ENDARTERECTOMY FEMORAL;  Surgeon: Annice Needy, MD;  Location: [facility];  Service: Vascular;                Laterality: Bilateral; 11/14/2019:  LOWER EXTREMITY ANGIOGRAPHY; Right     Comment:  Procedure: LOWER EXTREMITY ANGIOGRAPHY;  Surgeon: Annice Needy, MD;  Location: [facility];  Service:               Cardiovascular;  Laterality: Right; 12/19/2021: LOWER EXTREMITY ANGIOGRAPHY; Right     Comment:  Procedure: Lower Extremity Angiography;  Surgeon: Annice Needy, MD;  Location: [facility];  Service:               Cardiovascular;  Laterality: Right; 01/30/2022: LOWER EXTREMITY ANGIOGRAPHY; Right     Comment:  Procedure: Lower Extremity Angiography;  Surgeon: Annice Needy, MD;  Location: [facility];  Service:               Cardiovascular;  Laterality: Right; No date: STOMACH SURGERY No date: TONSILECTOMY, ADENOIDECTOMY,  BILATERAL MYRINGOTOMY AND TUBES  BMI    Body Mass Index: 22.75 kg/m      Reproductive/Obstetrics negative OB ROS                             Anesthesia Physical Anesthesia Plan  ASA: 3  Anesthesia Plan: General   Post-op Pain Management:    Induction: Intravenous  PONV Risk Score and Plan: 1 and Propofol infusion, TIVA, Ondansetron and Treatment may vary due to age or medical condition  Airway Management Planned: Natural Airway and Mask  Additional Equipment:   Intra-op Plan:   Post-operative Plan:   Informed Consent: I have reviewed the patients History and Physical, chart, labs and discussed the procedure including the risks, benefits and alternatives for the proposed anesthesia with the patient or authorized representative who has indicated his/her understanding and acceptance.     Dental Advisory Given  Plan Discussed with: CRNA and Surgeon  Anesthesia Plan Comments: (LMA/GETA backup discussed.  Patient consented for risks of anesthesia including but not limited to:  - adverse reactions to medications - damage to eyes, teeth, lips or other oral mucosa - nerve damage due to positioning  - sore  throat or hoarseness - damage to heart, brain, nerves, lungs, other parts of body or loss of life  Informed patient about role of CRNA in peri- and intra-operative care.  Patient voiced understanding.)        Anesthesia Quick Evaluation

## 2023-05-21 NOTE — Op Note (Signed)
Brendan Williams  Percutaneous Study/Intervention Procedural Note   Date of Surgery:  05/21/2023  Surgeon(s):Ilea Hilton    Assistants:none  Pre-operative Diagnosis: PAD with gangrenous changes of the toes on the right foot  Post-operative diagnosis:  Same  Procedure(s) Performed:             1.  Ultrasound guidance for vascular access left femoral artery             2.  Catheter placement into aorta from left femoral approach             3.  Aortogram and selective right lower extremity angiogram             4.  StarClose closure device left femoral artery  EBL: 5 cc  Contrast: 55 cc  Fluoro Time: 1.2 minutes  Anesthesia: MAC              Indications:  Patient is a 62 y.o.male with a long history of peripheral arterial disease with gangrenous changes to toes on the right foot and ischemic rest pain. The patient is brought in for angiography for further evaluation and potential treatment.  Due to the limb threatening nature of the situation, angiogram was performed for attempted limb salvage. The patient is aware that if the procedure fails, amputation would be expected.  The patient also understands that even with successful revascularization, amputation may still be required due to the severity of the situation.  Risks and benefits are discussed and informed consent is obtained.   Procedure:  The patient was identified and appropriate procedural time out was performed.  The patient was then placed supine on the table and prepped and draped in the usual sterile fashion. Moderate conscious sedation was administered during a face to face encounter with the patient throughout the procedure with my supervision of the RN administering medicines and monitoring the patient's vital signs, pulse oximetry, telemetry and mental status throughout from the start of the procedure until the patient was taken to the recovery room. Ultrasound was used to evaluate the left common femoral  artery.  It was patent .  A digital ultrasound image was acquired.  A Seldinger needle was used to access the left common femoral artery under direct ultrasound guidance and a permanent image was performed.  A 0.035 J wire was advanced without resistance and a 5Fr sheath was placed.  Pigtail catheter was placed into the aorta and an AP aortogram was performed. This demonstrated normal renal arteries. The aorta and iliac arteries are patent after extensive iliac stenting going up into the distal aorta precluding up and over intervention.  I then elected to image the right lower extremity through the pigtail catheter and the aorta.  Given the iliac stenting up into the aorta, I did not feel like going up and over was technically feasible.  This demonstrated the common femoral artery to be patent, but the profunda femoris artery had about an 80 to 90% stenosis in the proximal segment.  The SFA was occluded flush at the level of the origin of the SFA where previous stents had been placed.  There were occlusion of the SFA and popliteal stents.  The tibial vessels did not opacify on delayed imaging well and I could not identify target for bypass on this image.  This was done with anesthesia providing MAC and there was still patient motion and extremely sluggish flow. It was felt that the patient was going to clearly need a redo  femoral endarterectomy to address the profunda disease and at that time we can assess for either surgical bypass with femoral to distal bypass or recanalizing his SFA and popliteal stents.  I elected to terminate the procedure. The sheath was removed and StarClose closure device was deployed in the left femoral artery with excellent hemostatic result. The patient was taken to the recovery room in stable condition having tolerated the procedure well.  Findings:               Aortogram:  This demonstrated normal renal arteries. The aorta and iliac arteries are patent after extensive iliac stenting  going up into the distal aorta precluding up and over intervention.             Right lower Extremity:  This demonstrated the common femoral artery to be patent, but the profunda femoris artery had about an 80 to 90% stenosis in the proximal segment.  The SFA was occluded flush at the level of the origin of the SFA where previous stents had been placed.  There were occlusion of the SFA and popliteal stents.  The tibial vessels did not opacify on delayed imaging well and I could not identify target for bypass on this image.  This was done with anesthesia providing MAC and there was still patient motion and extremely sluggish flow.   Disposition: Patient was taken to the recovery room in stable condition having tolerated the procedure well.  Complications: None  Brendan Williams 05/21/2023 2:46 PM   This note was created with Dragon Medical transcription system. Any errors in dictation are purely unintentional.

## 2023-05-21 NOTE — Plan of Care (Signed)
  Problem: Education: Goal: Knowledge of General Education information will improve Description: Including pain rating scale, medication(s)/side effects and non-pharmacologic comfort measures Outcome: Progressing   Problem: Clinical Measurements: Goal: Respiratory complications will improve Outcome: Progressing   Problem: Clinical Measurements: Goal: Cardiovascular complication will be avoided Outcome: Progressing   Problem: Elimination: Goal: Will not experience complications related to bowel motility Outcome: Progressing   Problem: Elimination: Goal: Will not experience complications related to urinary retention Outcome: Progressing   Problem: Pain Managment: Goal: General experience of comfort will improve Outcome: Progressing   Problem: Safety: Goal: Ability to remain free from injury will improve Outcome: Progressing

## 2023-05-22 ENCOUNTER — Encounter: Payer: Self-pay | Admitting: Vascular Surgery

## 2023-05-22 DIAGNOSIS — I70221 Atherosclerosis of native arteries of extremities with rest pain, right leg: Secondary | ICD-10-CM | POA: Diagnosis not present

## 2023-05-22 MED ORDER — ALPRAZOLAM 0.25 MG PO TABS
0.2500 mg | ORAL_TABLET | Freq: Three times a day (TID) | ORAL | Status: DC | PRN
  Administered 2023-05-22 – 2023-05-27 (×5): 0.25 mg via ORAL
  Filled 2023-05-22 (×5): qty 1

## 2023-05-22 NOTE — Progress Notes (Signed)
  Progress Note    05/22/2023 11:38 AM 1 Day Post-Op  Subjective:  Brendan Williams is a 62 yo male now POD#1 from Aortogram and selective right lower extremity angiogram. Patient resting comfortably in bed eating breakfast this morning. Patients wife at the bedside. Patient endorses his right lower extremity feels better. Endorses his toes remain cool to touch with some pain on palpation but the rest of his leg feels much warmer with much less resting pain. Patient denies any Chest pain or shortness of breath.    Vitals:   05/22/23 0857 05/22/23 1131  BP: 116/69 119/72  Pulse: (!) 51 (!) 59  Resp: 16 16  Temp: 98 F (36.7 C) (!) 97.5 F (36.4 C)  SpO2: 100% 99%   Physical Exam: Cardiac:  RRR, Normal S1, S2. No murmurs Lungs:  Scattered Rhonchi on auscultation. Normal respiratory effort.  Incisions:  Left Groin from prior angio. With dressing Clean dry and intact. No hematoma to note.  Extremities:  Left lower extremity with palpable pulses and warm to touch. Right lower extremity with weak lower doppler pulses. Cool to touch from mid calf to toes.  Abdomen:  Positive bowel sounds throughout, soft, non tender and non distended.  Neurologic: AAOx3 follows commands and answers questions appropriately.   CBC    Component Value Date/Time   WBC 10.1 05/22/2023 0307   RBC 4.48 05/22/2023 0307   HGB 13.8 05/22/2023 0307   HCT 42.2 05/22/2023 0307   PLT 379 05/22/2023 0307   MCV 94.2 05/22/2023 0307   MCH 30.8 05/22/2023 0307   MCHC 32.7 05/22/2023 0307   RDW 13.0 05/22/2023 0307    BMET    Component Value Date/Time   NA 137 05/19/2023 0517   K 3.6 05/19/2023 0517   CL 105 05/19/2023 0517   CO2 23 05/19/2023 0517   GLUCOSE 96 05/19/2023 0517   BUN 6 (L) 05/19/2023 0517   CREATININE 0.77 05/20/2023 0411   CALCIUM 8.4 (L) 05/19/2023 0517   GFRNONAA >60 05/20/2023 0411   GFRAA >60 12/04/2019 0523    INR    Component Value Date/Time   INR 1.1 05/18/2023 1253      Intake/Output Summary (Last 24 hours) at 05/22/2023 1138 Last data filed at 05/22/2023 1129 Gross per 24 hour  Intake 1043 ml  Output 1475 ml  Net -432 ml     Assessment/Plan:  62 y.o. male is s/p right lower extremity angiogram with intervention.  1 Day Post-Op   PLAN: Vascular surgery plans on taking the patient to the operating room tomorrow 05/23/2019 for 4 a right femoral endarterectomy redo with possible stent intervention with [name].  I had a long discussion with the patient and his wife at the bedside.  We discussed in detail the procedure, benefits, complications, and risks.  They both verbalized their understanding and wished to proceed tomorrow.  I answered all her questions today.  Patient will be made n.p.o. after midnight for the procedure tomorrow.  Remains on a heparin drip until surgery.  DVT prophylaxis: Heparin infusion   [name] [facility] 05/22/2023 11:38 AM

## 2023-05-22 NOTE — Progress Notes (Signed)
PROGRESS NOTE    Brendan Williams  ZOX:[id number] DOB: 1961/05/23 DOA: 05/18/2023 PCP: Gildardo Pounds, PA    Brief Narrative:     Brendan Williams is a 62 y.o. male with medical history significant of severe PAD s/p stenting (Most recent April 2023), tobacco use disorder, neuropathy, who presented to the ED due to right foot pain and redness.  He had been experiencing pain in the right foot since he recently saw his vascular surgeon.  However, pain in the right foot had progressively worsened about 3 to 4 days prior to admission.  Noticed purplish/redness discoloration on the dorsal aspect of the right foot and toes.  He also endorsed fever at home.     Workup revealed right foot cellulitis and critical right limb ischemia.  Status post angiography with vascular surgery on 8/5.  No stents or angioplasty performed   Assessment & Plan:   Principal Problem:   Critical limb ischemia of right lower extremity (HCC) Active Problems:   Tobacco use disorder   Cellulitis   Cellulitis and abscess of toe of right foot  PVD, critical limb ischemia of right lower extremity:  Unable to exclude overlying cellulitis Status post angiography with endovascular surgery on 8/5 No stents or angioplasty performed at that time Plan: Continue heparin GTT Continue aspirin and Lipitor Follow-up vascular surgery As needed pain control     Right foot cellulitis:  Continue IV ceftriaxone and vancomycin.   No growth on blood cultures thus far. Attempt to de-escalate antimicrobial therapy within 24 hours     Hypotension: Improved     Tobacco use disorder: Counseled to quit smoking cigarettes.    DVT prophylaxis: IV heparin Code Status: Full Family Communication: Spouse at bedside 8/6 Disposition Plan: Status is: Inpatient Remains inpatient appropriate because: Critical limb ischemia.  Lower extremity cellulitis on IV antibiotics   Level of care: Progressive  Consultants:  Vascular  surgery  Procedures:  Lower extremity angiogram 8/5  Antimicrobials: Vancomycin Ceftriaxone   Subjective: Seen and examined.  Resting in bed.  Reports some pain but well-controlled.  Does report anxiety  Objective: Vitals:   05/22/23 0009 05/22/23 0516 05/22/23 0857 05/22/23 1131  BP: (!) 83/54 102/66 116/69 119/72  Pulse: (!) 56 (!) 51 (!) 51 (!) 59  Resp: 18 18 16 16   Temp: 98.3 F (36.8 C) 97.9 F (36.6 C) 98 F (36.7 C) (!) 97.5 F (36.4 C)  TempSrc:   Oral Oral  SpO2: 94% 100% 100% 99%  Weight:      Height:        Intake/Output Summary (Last 24 hours) at 05/22/2023 1132 Last data filed at 05/22/2023 1129 Gross per 24 hour  Intake 1043 ml  Output 1475 ml  Net -432 ml   Filed Weights   05/18/23 1251  Weight: 78.2 kg    Examination:  General exam: Appears calm and comfortable  Respiratory system: Scattered rhonchi bilaterally.  Normal work of breathing.  Room air Cardiovascular system: S1 and 2, RRR, no murmurs, no pedal edema Gastrointestinal system: Soft NT/ND, normal bowel sounds Central nervous system: Alert and oriented. No focal neurological deficits. Extremities: Symmetric 5 x 5 power. Skin: Purple discoloration of right foot and toes.  Status post toe amputations.  Right foot tender to palpation Psychiatry: Judgement and insight appear normal. Mood & affect appropriate.     Data Reviewed: I have personally reviewed following labs and imaging studies  CBC: Recent Labs  Lab 05/18/23 1253 05/19/23 0517 05/20/23 0411  05/21/23 0547 05/22/23 0307  WBC 13.6* 11.4* 10.2 9.6 10.1  HGB 14.6 13.1 12.7* 12.7* 13.8  HCT 43.7 39.4 37.5* 37.7* 42.2  MCV 93.6 95.4 92.6 92.9 94.2  PLT 410* 369 350 379 379   Basic Metabolic Panel: Recent Labs  Lab 05/18/23 1253 05/19/23 0517 05/20/23 0411  NA 131* 137  --   K 3.8 3.6  --   CL 94* 105  --   CO2 27 23  --   GLUCOSE 99 96  --   BUN 8 6*  --   CREATININE 0.83 0.65 0.77  CALCIUM 9.0 8.4*  --     GFR: Estimated Creatinine Clearance: 105.9 mL/min (by C-G formula based on SCr of 0.77 mg/dL). Liver Function Tests: Recent Labs  Lab 05/18/23 1253 05/19/23 0517  AST 16 13*  ALT 11 8  ALKPHOS 67 54  BILITOT 0.7 0.5  PROT 8.0 6.3*  ALBUMIN 3.7 2.9*   No results for input(s): "LIPASE", "AMYLASE" in the last 168 hours. No results for input(s): "AMMONIA" in the last 168 hours. Coagulation Profile: Recent Labs  Lab 05/18/23 1253  INR 1.1   Cardiac Enzymes: No results for input(s): "CKTOTAL", "CKMB", "CKMBINDEX", "TROPONINI" in the last 168 hours. BNP (last 3 results) No results for input(s): "PROBNP" in the last 8760 hours. HbA1C: No results for input(s): "HGBA1C" in the last 72 hours. CBG: Recent Labs  Lab 05/21/23 0455  GLUCAP 99   Lipid Profile: No results for input(s): "CHOL", "HDL", "LDLCALC", "TRIG", "CHOLHDL", "LDLDIRECT" in the last 72 hours. Thyroid Function Tests: No results for input(s): "TSH", "T4TOTAL", "FREET4", "T3FREE", "THYROIDAB" in the last 72 hours. Anemia Panel: No results for input(s): "VITAMINB12", "FOLATE", "FERRITIN", "TIBC", "IRON", "RETICCTPCT" in the last 72 hours. Sepsis Labs: No results for input(s): "PROCALCITON", "LATICACIDVEN" in the last 168 hours.  Recent Results (from the past 240 hour(s))  Culture, blood (single)     Status: None (Preliminary result)   Collection Time: 05/18/23  3:15 PM   Specimen: BLOOD LEFT ARM  Result Value Ref Range Status   Specimen Description BLOOD LEFT ARM  Final   Special Requests   Final    BOTTLES DRAWN AEROBIC AND ANAEROBIC Blood Culture adequate volume   Culture   Final    NO GROWTH 4 DAYS Performed at [facility], [address] [place], [place] [address]    Report Status PENDING  Incomplete         Radiology Studies: PERIPHERAL VASCULAR CATHETERIZATION  Result Date: 05/21/2023 See surgical note for result.       Scheduled Meds:  aspirin EC  81 mg Oral Daily    atorvastatin  40 mg Oral Daily   sodium chloride flush  3 mL Intravenous Q12H   Continuous Infusions:  cefTRIAXone (ROCEPHIN)  IV 1 g (05/21/23 1119)   heparin 2,150 Units/hr (05/21/23 2321)   vancomycin 1,250 mg (05/22/23 0332)     LOS: 4 days      Tresa Moore, MD [facility]   If 7PM-7AM, please contact night-coverage  05/22/2023, 11:32 AM

## 2023-05-22 NOTE — Plan of Care (Signed)
  Problem: Pain Managment: Goal: General experience of comfort will improve Outcome: Progressing   Problem: Safety: Goal: Ability to remain free from injury will improve Outcome: Progressing   Problem: Elimination: Goal: Will not experience complications related to bowel motility Outcome: Progressing Goal: Will not experience complications related to urinary retention Outcome: Progressing   Problem: Coping: Goal: Level of anxiety will decrease Outcome: Progressing   Problem: Nutrition: Goal: Adequate nutrition will be maintained Outcome: Progressing   Problem: Clinical Measurements: Goal: Ability to maintain clinical measurements within normal limits will improve Outcome: Progressing Goal: Will remain free from infection Outcome: Progressing Goal: Diagnostic test results will improve Outcome: Progressing Goal: Respiratory complications will improve Outcome: Progressing Goal: Cardiovascular complication will be avoided Outcome: Progressing   Problem: Health Behavior/Discharge Planning: Goal: Ability to manage health-related needs will improve Outcome: Progressing

## 2023-05-22 NOTE — Consult Note (Signed)
Pharmacy Antibiotic Note  Brendan Williams is a 62 y.o. male admitted on 05/18/2023 with cellulitis.  Pharmacy has been consulted for vancomycin dosing. Afeb, WBC 10.1,   Plan: Day 5 of abx. Vancomycin trough 15. Continue vancomycin 1250 mg BID. Recommend getting levels if clinically needed due to denied in renal function or in 5 days.   Continue ceftriaxone 1 g daily.   Height: 6\' 1"  (185.4 cm) Weight: 78.2 kg (172 lb 6.4 oz) IBW/kg (Calculated) : 79.9  Temp (24hrs), Avg:98.1 F (36.7 C), Min:97.5 F (36.4 C), Max:98.5 F (36.9 C)  Recent Labs  Lab 05/18/23 1253 05/19/23 0517 05/20/23 0411 05/21/23 0547 05/22/23 0307  WBC 13.6* 11.4* 10.2 9.6 10.1  CREATININE 0.83 0.65 0.77  --   --   VANCOTROUGH  --   --   --   --  15    Estimated Creatinine Clearance: 105.9 mL/min (by C-G formula based on SCr of 0.77 mg/dL).    No Known Allergies  Antimicrobials this admission: Ceftriaxone 8/2 >>  Vancomycin 8/2 >>  Dose adjustments this admission: None  Microbiology results: 8/2 BCx: NGTD.   Thank you for allowing pharmacy to be a part of this patient's care.  Ronnald Ramp, PharmD, BCPS 05/22/2023 12:28 PM

## 2023-05-22 NOTE — Consult Note (Signed)
Pharmacy Consult Note - Anticoagulation  Pharmacy Consult for heparin Indication: PAD with gangrenous changes.   PATIENT MEASUREMENTS: Height: 6\' 1"  (185.4 cm) Weight: 78.2 kg (172 lb 6.4 oz) IBW/kg (Calculated) : 79.9 HEPARIN DW (KG): 78.2  VITAL SIGNS: Temp: 97.9 F (36.6 C) (08/06 0516) BP: 102/66 (08/06 0516) Pulse Rate: 51 (08/06 0516)  Recent Labs    05/20/23 0411 05/20/23 1015 05/22/23 0307  HGB 12.7*   < > 13.8  HCT 37.5*   < > 42.2  PLT 350   < > 379  HEPARINUNFRC 0.44   < > 0.52  CREATININE 0.77  --   --    < > = values in this interval not displayed.    Estimated Creatinine Clearance: 105.9 mL/min (by C-G formula based on SCr of 0.77 mg/dL).  PAST MEDICAL HISTORY: Past Medical History:  Diagnosis Date   Peripheral arterial disease [facility])     ASSESSMENT: 62 y.o. male with PMH PAD s/p left toe amputation, ischemic pain, neuropathy is presenting with right foot pain and swelling, reportedly turning black, concerning for limb ischemia. Patient is not on chronic anticoagulation per chart review. Pharmacy has been consulted to initiate and manage heparin intravenous infusion. S/p lower extremity angiography and plan for endarterectomy 8/7.   Pertinent medications: bASA daily No chronic anticoag PTA per chart review  Goal(s) of therapy: Heparin level 0.3 - 0.7 units/mL Monitor platelets by anticoagulation protocol: Yes   Baseline anticoagulation labs: Recent Labs    05/20/23 0411 05/21/23 0547 05/22/23 0307  HGB 12.7* 12.7* 13.8  PLT 350 379 379    Date Time aPTT/HL Rate/Comment 8/2       2212     < 0.1              1300 units/hr - gtt paused for MRI 8/3       0517        0.14            1300 units/hr  8/3 1245      <0.10  Subtherapeutic,1600>1850 u/hr 8/3 2040      0.27 Subtherapeutic 1850>2000 u/hr 8/4       0411         0.44           Therapeutic X 1  8/4 1015      0.26 Subtherapeutic 2000>2150 u/hr 8/4 1840      0.51 Therapeutic x 1 8/5        0058         0.60           Therapeutic X 2  8/5 0307      0.52  PLAN: Heparin level is therapeutic. Will continue heparin at 2150 units/hr. Recheck heparin level and CBC with AM labs.    Ronnald Ramp, PharmD Clinical Pharmacist 05/22/2023 7:53 AM

## 2023-05-22 NOTE — Progress Notes (Signed)
Mobility Specialist - Progress Note   05/22/23 1527  Mobility  Activity Refused mobility     Pt declined mobility, no reason specified. Education and encouragement provided, pt request to return tomorrow AM for OOB activity.    [name] Mobility Specialist 05/22/23, 3:27 PM

## 2023-05-23 ENCOUNTER — Other Ambulatory Visit: Payer: Self-pay

## 2023-05-23 ENCOUNTER — Encounter
Admission: EM | Disposition: A | Discharge status: Home / Self Care | Payer: Self-pay | Source: Home / Self Care | Attending: Internal Medicine

## 2023-05-23 ENCOUNTER — Encounter: Payer: Self-pay | Admitting: Internal Medicine

## 2023-05-23 ENCOUNTER — Inpatient Hospital Stay: Payer: Medicaid Other | Admitting: Anesthesiology

## 2023-05-23 ENCOUNTER — Inpatient Hospital Stay: Payer: Medicaid Other

## 2023-05-23 DIAGNOSIS — I70261 Atherosclerosis of native arteries of extremities with gangrene, right leg: Secondary | ICD-10-CM | POA: Diagnosis not present

## 2023-05-23 DIAGNOSIS — I743 Embolism and thrombosis of arteries of the lower extremities: Secondary | ICD-10-CM | POA: Diagnosis not present

## 2023-05-23 DIAGNOSIS — Z9889 Other specified postprocedural states: Secondary | ICD-10-CM | POA: Diagnosis not present

## 2023-05-23 DIAGNOSIS — T82858A Stenosis of vascular prosthetic devices, implants and grafts, initial encounter: Secondary | ICD-10-CM | POA: Diagnosis not present

## 2023-05-23 DIAGNOSIS — I70221 Atherosclerosis of native arteries of extremities with rest pain, right leg: Secondary | ICD-10-CM | POA: Diagnosis not present

## 2023-05-23 HISTORY — PX: MECHANICAL THROMBECTOMY WITH AORTOGRAM AND INTERVENTION: SHX6836

## 2023-05-23 HISTORY — PX: ENDOVASCULAR REPAIR/STENT GRAFT: CATH118280

## 2023-05-23 HISTORY — PX: ENDARTERECTOMY FEMORAL: SHX5804

## 2023-05-23 LAB — BPAM RBC
Blood Product Expiration Date: 202409082359
Blood Product Expiration Date: 202409082359
Blood Product Expiration Date: 202409082359
Blood Product Expiration Date: 202409082359
Unit Type and Rh: 5100
Unit Type and Rh: 5100
Unit Type and Rh: 5100
Unit Type and Rh: 5100

## 2023-05-23 LAB — TYPE AND SCREEN
ABO/RH(D): B POS
Antibody Screen: NEGATIVE
Unit division: 0
Unit division: 0
Unit division: 0
Unit division: 0

## 2023-05-23 LAB — GLUCOSE, CAPILLARY: Glucose-Capillary: 165 mg/dL — ABNORMAL HIGH (ref 70–99)

## 2023-05-23 LAB — PREPARE RBC (CROSSMATCH)

## 2023-05-23 LAB — HEPARIN LEVEL (UNFRACTIONATED): Heparin Unfractionated: 0.71 IU/mL — ABNORMAL HIGH (ref 0.30–0.70)

## 2023-05-23 SURGERY — ENDARTERECTOMY, FEMORAL
Anesthesia: General | Site: Leg Upper | Laterality: Right

## 2023-05-23 MED ORDER — DEXAMETHASONE SODIUM PHOSPHATE 10 MG/ML IJ SOLN
INTRAMUSCULAR | Status: DC | PRN
  Administered 2023-05-23: 10 mg via INTRAVENOUS

## 2023-05-23 MED ORDER — ONDANSETRON HCL 4 MG/2ML IJ SOLN: 4.0000 mg | Freq: Four times a day (QID) | INTRAMUSCULAR | Status: DC | PRN

## 2023-05-23 MED ORDER — "VISTASEAL 4 ML SINGLE DOSE KIT "
PACK | CUTANEOUS | Status: AC
  Filled 2023-05-23: qty 4

## 2023-05-23 MED ORDER — HYDRALAZINE HCL 20 MG/ML IJ SOLN: 5.0000 mg | INTRAMUSCULAR | Status: DC | PRN

## 2023-05-23 MED ORDER — HEPARIN SODIUM (PORCINE) 1000 UNIT/ML IJ SOLN
INTRAMUSCULAR | Status: AC
  Filled 2023-05-23: qty 10

## 2023-05-23 MED ORDER — KETAMINE HCL 10 MG/ML IJ SOLN
INTRAMUSCULAR | Status: DC | PRN
Start: 2023-05-23 — End: 2023-05-23
  Administered 2023-05-23: 20 mg via INTRAVENOUS

## 2023-05-23 MED ORDER — SODIUM CHLORIDE 0.9 % IV SOLN
INTRAVENOUS | Status: DC | PRN
  Administered 2023-05-23: 501 mL via SURGICAL_CAVITY

## 2023-05-23 MED ORDER — PAPAVERINE HCL 30 MG/ML IJ SOLN
INTRAMUSCULAR | Status: AC
  Filled 2023-05-23: qty 2

## 2023-05-23 MED ORDER — SODIUM CHLORIDE 0.9 % IV SOLN
INTRAVENOUS | Status: DC | PRN
Start: 2023-05-23 — End: 2023-05-23

## 2023-05-23 MED ORDER — CLOPIDOGREL BISULFATE 75 MG PO TABS
75.0000 mg | ORAL_TABLET | Freq: Every day | ORAL | Status: DC
  Administered 2023-05-24 – 2023-05-25 (×2): 75 mg via ORAL
  Filled 2023-05-23 (×2): qty 1

## 2023-05-23 MED ORDER — PHENOL 1.4 % MT LIQD: 1.0000 | OROMUCOSAL | Status: DC | PRN

## 2023-05-23 MED ORDER — DOPAMINE-DEXTROSE 3.2-5 MG/ML-% IV SOLN: 3.0000 ug/kg/min | INTRAVENOUS | Status: DC

## 2023-05-23 MED ORDER — CEFAZOLIN SODIUM-DEXTROSE 2-4 GM/100ML-% IV SOLN
INTRAVENOUS | Status: AC
  Filled 2023-05-23: qty 100

## 2023-05-23 MED ORDER — DEXAMETHASONE SODIUM PHOSPHATE 10 MG/ML IJ SOLN
INTRAMUSCULAR | Status: AC
  Filled 2023-05-23: qty 1

## 2023-05-23 MED ORDER — FENTANYL CITRATE (PF) 100 MCG/2ML IJ SOLN
INTRAMUSCULAR | Status: AC
  Filled 2023-05-23: qty 2

## 2023-05-23 MED ORDER — ONDANSETRON HCL 4 MG/2ML IJ SOLN
INTRAMUSCULAR | Status: AC
  Filled 2023-05-23: qty 2

## 2023-05-23 MED ORDER — ASPIRIN 81 MG PO TBEC
81.0000 mg | DELAYED_RELEASE_TABLET | Freq: Every day | ORAL | Status: DC
  Administered 2023-05-24 – 2023-05-29 (×6): 81 mg via ORAL
  Filled 2023-05-23 (×6): qty 1

## 2023-05-23 MED ORDER — MIDAZOLAM HCL 2 MG/2ML IJ SOLN
INTRAMUSCULAR | Status: AC
  Filled 2023-05-23: qty 2

## 2023-05-23 MED ORDER — FENTANYL CITRATE PF 50 MCG/ML IJ SOSY
12.5000 ug | PREFILLED_SYRINGE | Freq: Once | INTRAMUSCULAR | Status: AC | PRN
  Administered 2023-05-23: 12.5 ug via INTRAVENOUS
  Filled 2023-05-23: qty 1

## 2023-05-23 MED ORDER — ALUM & MAG HYDROXIDE-SIMETH 200-200-20 MG/5ML PO SUSP: 15.0000 mL | ORAL | Status: DC | PRN

## 2023-05-23 MED ORDER — CEFAZOLIN SODIUM-DEXTROSE 2-4 GM/100ML-% IV SOLN
2.0000 g | INTRAVENOUS | Status: AC
  Administered 2023-05-23 (×2): 2 g via INTRAVENOUS

## 2023-05-23 MED ORDER — PHENYLEPHRINE HCL-NACL 20-0.9 MG/250ML-% IV SOLN
INTRAVENOUS | Status: DC | PRN
  Administered 2023-05-23: 20 ug/min via INTRAVENOUS

## 2023-05-23 MED ORDER — CHLORHEXIDINE GLUCONATE CLOTH 2 % EX PADS
6.0000 | MEDICATED_PAD | Freq: Every day | CUTANEOUS | Status: DC
  Administered 2023-05-23 – 2023-05-28 (×4): 6 via TOPICAL

## 2023-05-23 MED ORDER — KETAMINE HCL 50 MG/5ML IJ SOSY
PREFILLED_SYRINGE | INTRAMUSCULAR | Status: AC
  Filled 2023-05-23: qty 5

## 2023-05-23 MED ORDER — LIDOCAINE HCL (PF) 2 % IJ SOLN
INTRAMUSCULAR | Status: AC
  Filled 2023-05-23: qty 5

## 2023-05-23 MED ORDER — OXYCODONE HCL 5 MG/5ML PO SOLN: 5.0000 mg | Freq: Once | ORAL | Status: DC | PRN

## 2023-05-23 MED ORDER — ACETAMINOPHEN 325 MG PO TABS: 325.0000 mg | ORAL_TABLET | ORAL | Status: DC | PRN

## 2023-05-23 MED ORDER — PROPOFOL 10 MG/ML IV BOLUS
INTRAVENOUS | Status: AC
  Filled 2023-05-23: qty 20

## 2023-05-23 MED ORDER — NITROGLYCERIN IN D5W 200-5 MCG/ML-% IV SOLN: 5.0000 ug/min | INTRAVENOUS | Status: DC

## 2023-05-23 MED ORDER — PROPOFOL 10 MG/ML IV BOLUS
INTRAVENOUS | Status: DC | PRN
Start: 2023-05-23 — End: 2023-05-23
  Administered 2023-05-23: 110 mg via INTRAVENOUS
  Administered 2023-05-23 (×2): 30 mg via INTRAVENOUS

## 2023-05-23 MED ORDER — DIPHENHYDRAMINE HCL 50 MG/ML IJ SOLN
INTRAMUSCULAR | Status: AC
  Filled 2023-05-23: qty 1

## 2023-05-23 MED ORDER — SODIUM CHLORIDE 0.9 % IV SOLN: INTRAVENOUS | Status: DC

## 2023-05-23 MED ORDER — METHYLPREDNISOLONE SODIUM SUCC 125 MG IJ SOLR: 125.0000 mg | Freq: Once | INTRAMUSCULAR | Status: DC | PRN

## 2023-05-23 MED ORDER — PAPAVERINE HCL 30 MG/ML IJ SOLN
INTRAMUSCULAR | Status: DC | PRN
Start: 2023-05-23 — End: 2023-05-23
  Administered 2023-05-23: 60 mg via INTRAVENOUS

## 2023-05-23 MED ORDER — POTASSIUM CHLORIDE CRYS ER 20 MEQ PO TBCR: 20.0000 meq | EXTENDED_RELEASE_TABLET | Freq: Every day | ORAL | Status: DC | PRN

## 2023-05-23 MED ORDER — OXYCODONE HCL 5 MG PO TABS: 5.0000 mg | ORAL_TABLET | Freq: Once | ORAL | Status: DC | PRN

## 2023-05-23 MED ORDER — DROPERIDOL 2.5 MG/ML IJ SOLN
0.6250 mg | INTRAMUSCULAR | Status: AC
  Administered 2023-05-23: 0.625 mg via INTRAVENOUS

## 2023-05-23 MED ORDER — PHENYLEPHRINE HCL (PRESSORS) 10 MG/ML IV SOLN
INTRAVENOUS | Status: DC | PRN
  Administered 2023-05-23 (×3): 160 ug via INTRAVENOUS
  Administered 2023-05-23 (×2): 80 ug via INTRAVENOUS
  Administered 2023-05-23: 160 ug via INTRAVENOUS

## 2023-05-23 MED ORDER — GUAIFENESIN-DM 100-10 MG/5ML PO SYRP: 15.0000 mL | ORAL_SOLUTION | ORAL | Status: DC | PRN

## 2023-05-23 MED ORDER — SENNOSIDES-DOCUSATE SODIUM 8.6-50 MG PO TABS: 1.0000 | ORAL_TABLET | Freq: Every evening | ORAL | Status: DC | PRN

## 2023-05-23 MED ORDER — DROPERIDOL 2.5 MG/ML IJ SOLN
INTRAMUSCULAR | Status: AC
  Filled 2023-05-23: qty 2

## 2023-05-23 MED ORDER — GLYCOPYRROLATE 0.2 MG/ML IJ SOLN
INTRAMUSCULAR | Status: AC
  Filled 2023-05-23: qty 1

## 2023-05-23 MED ORDER — GABAPENTIN 300 MG PO CAPS
300.0000 mg | ORAL_CAPSULE | Freq: Two times a day (BID) | ORAL | Status: DC
  Administered 2023-05-23 – 2023-05-29 (×12): 300 mg via ORAL
  Filled 2023-05-23 (×12): qty 1

## 2023-05-23 MED ORDER — CHLORHEXIDINE GLUCONATE 0.12 % MT SOLN
15.0000 mL | Freq: Once | OROMUCOSAL | Status: AC
  Administered 2023-05-23: 15 mL via OROMUCOSAL

## 2023-05-23 MED ORDER — FAMOTIDINE 20 MG PO TABS
ORAL_TABLET | ORAL | Status: AC
  Filled 2023-05-23: qty 2

## 2023-05-23 MED ORDER — ROCURONIUM BROMIDE 10 MG/ML (PF) SYRINGE
PREFILLED_SYRINGE | INTRAVENOUS | Status: AC
  Filled 2023-05-23: qty 10

## 2023-05-23 MED ORDER — FAMOTIDINE IN NACL 20-0.9 MG/50ML-% IV SOLN
20.0000 mg | Freq: Two times a day (BID) | INTRAVENOUS | Status: DC
  Administered 2023-05-23 – 2023-05-24 (×2): 20 mg via INTRAVENOUS
  Filled 2023-05-23 (×2): qty 50

## 2023-05-23 MED ORDER — HEPARIN (PORCINE) 25000 UT/250ML-% IV SOLN
1900.0000 [IU]/h | INTRAVENOUS | Status: DC
  Administered 2023-05-23: 2100 [IU]/h via INTRAVENOUS
  Administered 2023-05-24: 2050 [IU]/h via INTRAVENOUS
  Administered 2023-05-24: 2100 [IU]/h via INTRAVENOUS
  Filled 2023-05-23 (×2): qty 250

## 2023-05-23 MED ORDER — DIPHENHYDRAMINE HCL 50 MG/ML IJ SOLN: 50.0000 mg | Freq: Once | INTRAMUSCULAR | Status: DC | PRN

## 2023-05-23 MED ORDER — NITROGLYCERIN IN D5W 200-5 MCG/ML-% IV SOLN
INTRAVENOUS | Status: AC
  Filled 2023-05-23: qty 250

## 2023-05-23 MED ORDER — MORPHINE SULFATE (PF) 2 MG/ML IV SOLN
2.0000 mg | INTRAVENOUS | Status: DC | PRN
  Administered 2023-05-27 – 2023-05-28 (×2): 2 mg via INTRAVENOUS
  Filled 2023-05-23 (×2): qty 1

## 2023-05-23 MED ORDER — GLYCOPYRROLATE 0.2 MG/ML IJ SOLN
INTRAMUSCULAR | Status: DC | PRN
  Administered 2023-05-23: .1 mg via INTRAVENOUS

## 2023-05-23 MED ORDER — ACETAMINOPHEN 650 MG RE SUPP: 325.0000 mg | RECTAL | Status: DC | PRN

## 2023-05-23 MED ORDER — CEFAZOLIN SODIUM-DEXTROSE 2-4 GM/100ML-% IV SOLN
2.0000 g | Freq: Three times a day (TID) | INTRAVENOUS | Status: AC
  Administered 2023-05-23 – 2023-05-24 (×2): 2 g via INTRAVENOUS
  Filled 2023-05-23 (×2): qty 100

## 2023-05-23 MED ORDER — "VISTASEAL 4 ML SINGLE DOSE KIT "
PACK | CUTANEOUS | Status: DC | PRN
  Administered 2023-05-23: 4 mL via TOPICAL

## 2023-05-23 MED ORDER — PHENYLEPHRINE 80 MCG/ML (10ML) SYRINGE FOR IV PUSH (FOR BLOOD PRESSURE SUPPORT)
PREFILLED_SYRINGE | INTRAVENOUS | Status: AC
  Filled 2023-05-23: qty 10

## 2023-05-23 MED ORDER — FENTANYL CITRATE (PF) 100 MCG/2ML IJ SOLN
INTRAMUSCULAR | Status: DC | PRN
  Administered 2023-05-23 (×2): 50 ug via INTRAVENOUS

## 2023-05-23 MED ORDER — HYDROMORPHONE HCL 1 MG/ML IJ SOLN
1.0000 mg | Freq: Once | INTRAMUSCULAR | Status: AC | PRN
  Administered 2023-05-24: 1 mg via INTRAVENOUS
  Filled 2023-05-23: qty 1

## 2023-05-23 MED ORDER — HEPARIN SODIUM (PORCINE) 5000 UNIT/ML IJ SOLN
INTRAMUSCULAR | Status: AC
  Filled 2023-05-23: qty 1

## 2023-05-23 MED ORDER — SEVOFLURANE IN SOLN
RESPIRATORY_TRACT | Status: AC
  Filled 2023-05-23: qty 250

## 2023-05-23 MED ORDER — CHLORHEXIDINE GLUCONATE 0.12 % MT SOLN
OROMUCOSAL | Status: AC
  Filled 2023-05-23: qty 15

## 2023-05-23 MED ORDER — HEMOSTATIC AGENTS (NO CHARGE) OPTIME
TOPICAL | Status: DC | PRN
  Administered 2023-05-23: 1 via TOPICAL

## 2023-05-23 MED ORDER — HEPARIN 30,000 UNITS/1000 ML (OHS) CELLSAVER SOLUTION
Status: AC
  Filled 2023-05-23: qty 1000

## 2023-05-23 MED ORDER — MIDAZOLAM HCL 2 MG/2ML IJ SOLN
INTRAMUSCULAR | Status: DC | PRN
  Administered 2023-05-23: 2 mg via INTRAVENOUS

## 2023-05-23 MED ORDER — LACTATED RINGERS IV SOLN: INTRAVENOUS | Status: DC | PRN

## 2023-05-23 MED ORDER — LABETALOL HCL 5 MG/ML IV SOLN: 10.0000 mg | INTRAVENOUS | Status: DC | PRN

## 2023-05-23 MED ORDER — SORBITOL 70 % SOLN: 30.0000 mL | Freq: Every day | Status: DC | PRN

## 2023-05-23 MED ORDER — EPHEDRINE SULFATE-NACL 50-0.9 MG/10ML-% IV SOSY
PREFILLED_SYRINGE | INTRAVENOUS | Status: DC | PRN
  Administered 2023-05-23 (×3): 5 mg via INTRAVENOUS

## 2023-05-23 MED ORDER — SODIUM CHLORIDE 0.9% IV SOLUTION: Freq: Once | INTRAVENOUS | Status: DC

## 2023-05-23 MED ORDER — DOCUSATE SODIUM 100 MG PO CAPS
100.0000 mg | ORAL_CAPSULE | Freq: Every day | ORAL | Status: DC
  Administered 2023-05-24 – 2023-05-28 (×4): 100 mg via ORAL
  Filled 2023-05-23 (×6): qty 1

## 2023-05-23 MED ORDER — LIDOCAINE HCL (CARDIAC) PF 100 MG/5ML IV SOSY
PREFILLED_SYRINGE | INTRAVENOUS | Status: DC | PRN
  Administered 2023-05-23: 100 mg via INTRAVENOUS

## 2023-05-23 MED ORDER — MIDAZOLAM HCL 2 MG/ML PO SYRP: 8.0000 mg | ORAL_SOLUTION | Freq: Once | ORAL | Status: DC | PRN

## 2023-05-23 MED ORDER — ROCURONIUM BROMIDE 100 MG/10ML IV SOLN
INTRAVENOUS | Status: DC | PRN
  Administered 2023-05-23: 60 mg via INTRAVENOUS
  Administered 2023-05-23 (×2): 20 mg via INTRAVENOUS
  Administered 2023-05-23: 10 mg via INTRAVENOUS

## 2023-05-23 MED ORDER — SUGAMMADEX SODIUM 200 MG/2ML IV SOLN
INTRAVENOUS | Status: DC | PRN
  Administered 2023-05-23: 200 mg via INTRAVENOUS

## 2023-05-23 MED ORDER — HEPARIN 30,000 UNITS/1000 ML (OHS) CELLSAVER SOLUTION
Status: AC | PRN
  Administered 2023-05-23: 1

## 2023-05-23 MED ORDER — MAGNESIUM SULFATE 2 GM/50ML IV SOLN: 2.0000 g | Freq: Every day | INTRAVENOUS | Status: DC | PRN

## 2023-05-23 MED ORDER — FENTANYL CITRATE (PF) 100 MCG/2ML IJ SOLN
25.0000 ug | INTRAMUSCULAR | Status: DC | PRN
  Administered 2023-05-23: 50 ug via INTRAVENOUS
  Administered 2023-05-23: 25 ug via INTRAVENOUS

## 2023-05-23 MED ORDER — CHLORHEXIDINE GLUCONATE CLOTH 2 % EX PADS
6.0000 | MEDICATED_PAD | Freq: Once | CUTANEOUS | Status: AC
  Administered 2023-05-23: 6 via TOPICAL

## 2023-05-23 MED ORDER — SODIUM CHLORIDE 0.9 % IV SOLN: 500.0000 mL | Freq: Once | INTRAVENOUS | Status: DC | PRN

## 2023-05-23 MED ORDER — HEPARIN SODIUM (PORCINE) 1000 UNIT/ML IJ SOLN
INTRAMUSCULAR | Status: DC | PRN
  Administered 2023-05-23: 2000 [IU] via INTRAVENOUS
  Administered 2023-05-23: 2500 [IU] via INTRAVENOUS
  Administered 2023-05-23: 6000 [IU] via INTRAVENOUS

## 2023-05-23 MED ORDER — FAMOTIDINE 20 MG PO TABS
40.0000 mg | ORAL_TABLET | Freq: Once | ORAL | Status: AC | PRN
  Administered 2023-05-23: 40 mg via ORAL

## 2023-05-23 MED ORDER — OXYCODONE-ACETAMINOPHEN 5-325 MG PO TABS
1.0000 | ORAL_TABLET | ORAL | Status: DC | PRN
  Administered 2023-05-24 – 2023-05-29 (×18): 2 via ORAL
  Filled 2023-05-23 (×18): qty 2

## 2023-05-23 MED ORDER — ONDANSETRON HCL 4 MG/2ML IJ SOLN
INTRAMUSCULAR | Status: DC | PRN
  Administered 2023-05-23: 4 mg via INTRAVENOUS

## 2023-05-23 MED ORDER — METOPROLOL TARTRATE 5 MG/5ML IV SOLN: 2.0000 mg | INTRAVENOUS | Status: DC | PRN

## 2023-05-23 SURGICAL SUPPLY — 106 items
ADH SKN CLS APL DERMABOND .7 (GAUZE/BANDAGES/DRESSINGS) ×6
AGENT HMST PWDR BTL CLGN 5GM (Miscellaneous) ×3 IMPLANT
APL PRP STRL LF DISP 70% ISPRP (MISCELLANEOUS) ×6
APPLIER CLIP 11 MED OPEN (CLIP)
APPLIER CLIP 13 LRG OPEN (CLIP)
APPLIER CLIP 9.375 SM OPEN (CLIP)
APR CLP LRG 13 20 CLIP (CLIP)
APR CLP MED 11 20 MLT OPN (CLIP)
APR CLP SM 9.3 20 MLT OPN (CLIP)
BAG COUNTER SPONGE SURGICOUNT (BAG) ×3 IMPLANT
BAG DECANTER FOR FLEXI CONT (MISCELLANEOUS) ×3 IMPLANT
BAG ISL LRG 20X20 DRWSTRG (DRAPES) ×3
BAG ISOLATATION DRAPE 20X20 ST (DRAPES) ×3 IMPLANT
BAG SPNG CNTER NS LX DISP (BAG) ×3
BALLN LUTONIX 018 5X100X130 (BALLOONS) ×3
BALLN LUTONIX 018 7X150X130 (BALLOONS) ×3
BALLN ULTRVRSE 3X300X150 (BALLOONS) ×3
BALLN ULTRVRSE 3X300X150 OTW (BALLOONS) ×3
BALLOON LUTONIX 018 5X100X130 (BALLOONS) IMPLANT
BALLOON LUTONIX 018 7X150X130 (BALLOONS) IMPLANT
BALLOON ULTRVRSE 3X300X150 OTW (BALLOONS) IMPLANT
BLADE SURG 15 STRL LF DISP TIS (BLADE) ×3 IMPLANT
BLADE SURG 15 STRL SS (BLADE) ×3
BLADE SURG SZ11 CARB STEEL (BLADE) ×3 IMPLANT
BOOT SUTURE VASCULAR YLW (MISCELLANEOUS) ×6
BRUSH SCRUB EZ 4% CHG (MISCELLANEOUS) ×3 IMPLANT
CANISTER PENUMBRA ENGINE (MISCELLANEOUS) IMPLANT
CATH BEACON 5 .035 65 KMP TIP (CATHETERS) IMPLANT
CATH INDIGO CAT6 KIT (CATHETERS) IMPLANT
CATH NAVICROSS ANGLED 135CM (MICROCATHETER) IMPLANT
CATH ROTAREX 135 6FR (CATHETERS) IMPLANT
CHLORAPREP W/TINT 26 (MISCELLANEOUS) ×6 IMPLANT
CLAMP SUTURE YELLOW 5 PAIRS (MISCELLANEOUS) ×6 IMPLANT
CLIP APPLIE 11 MED OPEN (CLIP) IMPLANT
CLIP APPLIE 13 LRG OPEN (CLIP) IMPLANT
CLIP APPLIE 9.375 SM OPEN (CLIP) IMPLANT
COLLAGEN CELLERATERX 5 GRAM (Miscellaneous) IMPLANT
DERMABOND ADVANCED .7 DNX12 (GAUZE/BANDAGES/DRESSINGS) ×6 IMPLANT
DRAPE 3/4 80X56 (DRAPES) ×3 IMPLANT
DRAPE C-ARM XRAY 36X54 (DRAPES) IMPLANT
DRAPE INCISE IOBAN 66X45 STRL (DRAPES) ×6 IMPLANT
DRESSING PEEL AND PLC PRVNA 13 (GAUZE/BANDAGES/DRESSINGS) IMPLANT
DRESSING SURGICEL FIBRLLR 1X2 (HEMOSTASIS) ×6 IMPLANT
DRSG OPSITE POSTOP 4X6 (GAUZE/BANDAGES/DRESSINGS) IMPLANT
DRSG OPSITE POSTOP 4X8 (GAUZE/BANDAGES/DRESSINGS) IMPLANT
DRSG PEEL AND PLACE PREVENA 13 (GAUZE/BANDAGES/DRESSINGS) ×3
DRSG SURGICEL FIBRILLAR 1X2 (HEMOSTASIS) ×6
ELECT CAUTERY BLADE 6.4 (BLADE) ×3 IMPLANT
ELECT REM PT RETURN 9FT ADLT (ELECTROSURGICAL) ×3
ELECTRODE REM PT RTRN 9FT ADLT (ELECTROSURGICAL) ×3 IMPLANT
GAUZE 4X4 16PLY ~~LOC~~+RFID DBL (SPONGE) ×3 IMPLANT
GLIDEWIRE ADV .035X180CM (WIRE) IMPLANT
GLOVE BIO SURGEON STRL SZ7 (GLOVE) ×9 IMPLANT
GOWN STRL REUS W/ TWL LRG LVL3 (GOWN DISPOSABLE) ×9 IMPLANT
GOWN STRL REUS W/ TWL XL LVL3 (GOWN DISPOSABLE) ×6 IMPLANT
GOWN STRL REUS W/TWL LRG LVL3 (GOWN DISPOSABLE) ×9
GOWN STRL REUS W/TWL XL LVL3 (GOWN DISPOSABLE) ×6
GRAFT VASC PATCH XENOSURE 1X14 (Vascular Products) IMPLANT
HEAD CUTTING 'VALVULOTOME URSL (MISCELLANEOUS) IMPLANT
IV NS 500ML (IV SOLUTION) ×3
IV NS 500ML BAXH (IV SOLUTION) ×3 IMPLANT
KIT ENCORE 26 ADVANTAGE (KITS) IMPLANT
KIT TURNOVER KIT A (KITS) ×3 IMPLANT
LABEL OR SOLS (LABEL) ×3 IMPLANT
LOOP VESSEL MAXI 1X406 RED (MISCELLANEOUS) ×9 IMPLANT
LOOP VESSEL MINI 0.8X406 BLUE (MISCELLANEOUS) ×6 IMPLANT
MANIFOLD NEPTUNE II (INSTRUMENTS) ×3 IMPLANT
NDL FILTER BLUNT 18X1 1/2 (NEEDLE) ×3 IMPLANT
NEEDLE FILTER BLUNT 18X1 1/2 (NEEDLE) ×3 IMPLANT
NS IRRIG 1000ML POUR BTL (IV SOLUTION) ×3 IMPLANT
PACK BASIN MAJOR ARMC (MISCELLANEOUS) ×3 IMPLANT
PACK UNIVERSAL (MISCELLANEOUS) ×3 IMPLANT
PAD PREP OB/GYN DISP 24X41 (PERSONAL CARE ITEMS) ×3 IMPLANT
SET WALTER ACTIVATION W/DRAPE (SET/KITS/TRAYS/PACK) ×3 IMPLANT
SHEATH BRITE TIP 8FRX11 (SHEATH) IMPLANT
SPONGE T-LAP 18X18 ~~LOC~~+RFID (SPONGE) ×9 IMPLANT
STAPLER SKIN PROX 35W (STAPLE) IMPLANT
STENT VIABAHN 6X250X120 (Permanent Stent) IMPLANT
STENT VIABAHN 7X150X120 (Permanent Stent) IMPLANT
STENT VIABAHN 8X100X120 (Permanent Stent) ×3 IMPLANT
STENT VIABAHN 8X10X120 (Permanent Stent) IMPLANT
SUT MNCRL 4-0 (SUTURE) ×6
SUT MNCRL 4-0 27XMFL (SUTURE) ×6
SUT PROLENE 5 0 RB 1 DA (SUTURE) ×6 IMPLANT
SUT PROLENE 6 0 BV (SUTURE) ×18 IMPLANT
SUT SILK 2 0 (SUTURE) ×3
SUT SILK 2 0 SH (SUTURE) ×3 IMPLANT
SUT SILK 2-0 18XBRD TIE 12 (SUTURE) ×3 IMPLANT
SUT SILK 3 0 (SUTURE) ×3
SUT SILK 3-0 18XBRD TIE 12 (SUTURE) ×3 IMPLANT
SUT VIC AB 2-0 CT1 27 (SUTURE) ×12
SUT VIC AB 2-0 CT1 TAPERPNT 27 (SUTURE) ×12 IMPLANT
SUT VICRYL+ 3-0 36IN CT-1 (SUTURE) ×12 IMPLANT
SUTURE MNCRL 4-0 27XMF (SUTURE) ×6 IMPLANT
SYR 20ML LL LF (SYRINGE) ×3 IMPLANT
SYR 3ML LL SCALE MARK (SYRINGE) ×3 IMPLANT
SYR BULB IRRIG 60ML STRL (SYRINGE) ×3 IMPLANT
TAG SUTURE CLAMP YLW 5PR (MISCELLANEOUS) ×6
TOWEL OR 17X26 4PK STRL BLUE (TOWEL DISPOSABLE) IMPLANT
TRAP FLUID SMOKE EVACUATOR (MISCELLANEOUS) ×3 IMPLANT
TRAY FOLEY MTR SLVR 16FR STAT (SET/KITS/TRAYS/PACK) ×3 IMPLANT
VALVULOTOME HEAD CUTTING URSL (MISCELLANEOUS) IMPLANT
VALVULOTOME URESIL (MISCELLANEOUS)
WATER STERILE IRR 500ML POUR (IV SOLUTION) ×3 IMPLANT
WIRE G 018X200 V18 (WIRE) IMPLANT
WIRE G V18X300CM (WIRE) IMPLANT

## 2023-05-23 NOTE — Anesthesia Preprocedure Evaluation (Signed)
Anesthesia Evaluation  Patient identified by MRN, date of birth, ID band Patient awake    Reviewed: Allergy & Precautions, NPO status , Patient's Chart, lab work & pertinent test results  History of Anesthesia Complications Negative for: history of anesthetic complications  Airway Mallampati: III  TM Distance: >3 FB Neck ROM: full    Dental  (+) Chipped, Poor Dentition, Missing   Pulmonary neg shortness of breath, COPD, Current Smoker and Patient abstained from smoking.   Pulmonary exam normal        Cardiovascular Exercise Tolerance: Good (-) angina (-) Past MI negative cardio ROS Normal cardiovascular exam     Neuro/Psych negative neurological ROS  negative psych ROS   GI/Hepatic negative GI ROS, Neg liver ROS,neg GERD  ,,  Endo/Other  negative endocrine ROS    Renal/GU      Musculoskeletal   Abdominal   Peds  Hematology negative hematology ROS (+)   Anesthesia Other Findings Past Medical History: No date: Peripheral arterial disease (HCC)  Past Surgical History: 12/02/2019: AMPUTATION TOE; Right     Comment:  Procedure: AMPUTATION RIGHT 5TH TOE;  Surgeon: Rosetta Posner, DPM;  Location: [facility] ORS;  Service: Podiatry;                Laterality: Right; 11/28/2019: APPLICATION OF WOUND VAC; Bilateral     Comment:  Procedure: APPLICATION OF WOUND VAC;  Surgeon: Annice Needy, MD;  Location: [facility] ORS;  Service: Vascular;                Laterality: Bilateral;  Prevena  No date: BACK SURGERY     Comment:  lower ruptured disk 11/28/2019: CENTRAL VENOUS CATHETER INSERTION     Comment:  Procedure: INSERTION CENTRAL LINE ADULT;  Surgeon: Annice Needy, MD;  Location: [facility] OR[facility];  Service: Vascular;; 11/28/2019: ENDARTERECTOMY FEMORAL; Bilateral     Comment:  Procedure: ENDARTERECTOMY FEMORAL;  Surgeon: Annice Needy, MD;  Location: [facility] OR[facility];  Service: Vascular;                 Laterality: Bilateral; 11/14/2019: LOWER EXTREMITY ANGIOGRAPHY; Right     Comment:  Procedure: LOWER EXTREMITY ANGIOGRAPHY;  Surgeon: Annice Needy, MD;  Location: [facility];  Service:               Cardiovascular;  Laterality: Right; 12/19/2021: LOWER EXTREMITY ANGIOGRAPHY; Right     Comment:  Procedure: Lower Extremity Angiography;  Surgeon: Annice Needy, MD;  Location: [facility];  Service:               Cardiovascular;  Laterality: Right; 01/30/2022: LOWER EXTREMITY ANGIOGRAPHY; Right     Comment:  Procedure: Lower Extremity Angiography;  Surgeon: Annice Needy, MD;  Location: [facility];  Service:               Cardiovascular;  Laterality: Right; 05/21/2023: LOWER EXTREMITY ANGIOGRAPHY;  Right     Comment:  Procedure: Lower Extremity Angiography;  Surgeon: Annice Needy, MD;  Location: [facility];  Service:               Cardiovascular;  Laterality: Right; No date: STOMACH SURGERY No date: TONSILECTOMY, ADENOIDECTOMY, BILATERAL MYRINGOTOMY AND TUBES  BMI    Body Mass Index: 22.75 kg/m      Reproductive/Obstetrics negative OB ROS                             Anesthesia Physical Anesthesia Plan  ASA: 3  Anesthesia Plan: General ETT   Post-op Pain Management:    Induction: Intravenous  PONV Risk Score and Plan: Ondansetron, Dexamethasone, Midazolam and Treatment may vary due to age or medical condition  Airway Management Planned: Oral ETT  Additional Equipment: Arterial line  Intra-op Plan:   Post-operative Plan: Extubation in OR and Possible Post-op intubation/ventilation  Informed Consent: I have reviewed the patients History and Physical, chart, labs and discussed the procedure including the risks, benefits and alternatives for the proposed anesthesia with the patient or authorized representative who has indicated his/her understanding and  acceptance.     Dental Advisory Given  Plan Discussed with: Anesthesiologist, CRNA and Surgeon  Anesthesia Plan Comments: (Patient consented for risks of anesthesia including but not limited to:  - adverse reactions to medications - damage to eyes, teeth, lips or other oral mucosa - nerve damage due to positioning  - sore throat or hoarseness - Damage to heart, brain, nerves, lungs, other parts of body or loss of life  Patient voiced understanding.)       Anesthesia Quick Evaluation

## 2023-05-23 NOTE — Progress Notes (Signed)
  Progress Note    05/23/2023 9:48 AM 2 Days Post-Op  Subjective:  Brendan Williams is a 62 yo male now POD#1 from Aortogram and selective right lower extremity angiogram. Patient resting comfortably in bed eating breakfast this morning. Patients wife at the bedside. Patient endorses his right lower extremity feels better. Endorses his toes remain cool to touch with some pain on palpation but the rest of his leg feels much warmer with much less resting pain. Patient denies any Chest pain or shortness of breath.    Vitals:   05/23/23 0344 05/23/23 0834  BP: 113/79 (!) 103/57  Pulse: (!) 59 70  Resp: 18 20  Temp: (!) 97.5 F (36.4 C) 98.3 F (36.8 C)  SpO2: 97% 97%   Physical Exam: Cardiac:  RRR, Normal S1, S2. No murmurs Lungs:  Scattered Rhonchi on auscultation. Normal respiratory effort.  Incisions:  Left Groin from prior angio. With dressing Clean dry and intact. No hematoma to note.  Extremities:  Left lower extremity with palpable pulses and warm to touch. Right lower extremity with weak lower doppler pulses. Cool to touch from mid calf to toes.  Abdomen:  Positive bowel sounds throughout, soft, non tender and non distended.  Neurologic: AAOx3 follows commands and answers questions appropriately.   CBC    Component Value Date/Time   WBC 10.1 05/22/2023 0307   RBC 4.48 05/22/2023 0307   HGB 13.8 05/22/2023 0307   HCT 42.2 05/22/2023 0307   PLT 379 05/22/2023 0307   MCV 94.2 05/22/2023 0307   MCH 30.8 05/22/2023 0307   MCHC 32.7 05/22/2023 0307   RDW 13.0 05/22/2023 0307    BMET    Component Value Date/Time   NA 136 05/23/2023 0524   K 3.4 (L) 05/23/2023 0524   CL 106 05/23/2023 0524   CO2 23 05/23/2023 0524   GLUCOSE 99 05/23/2023 0524   BUN <5 (L) 05/23/2023 0524   CREATININE 0.68 05/23/2023 0524   CALCIUM 8.5 (L) 05/23/2023 0524   GFRNONAA >60 05/23/2023 0524   GFRAA >60 12/04/2019 0523    INR    Component Value Date/Time   INR 1.1 05/18/2023 1253      Intake/Output Summary (Last 24 hours) at 05/23/2023 0948 Last data filed at 05/23/2023 0345 Gross per 24 hour  Intake --  Output 1200 ml  Net -1200 ml     Assessment/Plan:  62 y.o. male is s/p right lower extremity angiogram with intervention.  2 Days Post-Op   PLAN: Vascular surgery plans on taking the patient to the operating room tomorrow 05/23/2019 for 4 a right femoral endarterectomy redo with possible stent intervention with possible [name].  I had a long discussion with the patient and his wife at the bedside.  We discussed in detail the procedure, benefits, complications, and risks again this morning.  They both verbalized their understanding and wished to proceed later today.  I answered all her questions today.  Patient has been n.p.o. after midnight for the procedure later today.  Remains on a heparin drip until surgery.  DVT prophylaxis:  Heparin Infusion   [name] [facility] 05/23/2023 9:48 AM

## 2023-05-23 NOTE — Progress Notes (Signed)
eLink Physician-Brief Progress Note Patient Name: Brendan Williams DOB: Dec 14, 1960 MRN: [id number]   Date of Service  05/23/2023  HPI/Events of Note  62 year old male with a history of severe peripheral arterial disease, tobacco use, neuropathy, and previous arterial stenting who presented to the emergency department for right foot pain and redness with critical limb ischemia with angiography and vascular intervention on 8/5, return to the operating room on 8/7 due to SFA and popliteal stent occlusions and multiple occlusive areas.  Vital signs are within normal limits.  Currently not on any vasopressors.    eICU Interventions  Maintain dual antiplatelet therapy.  Maintain heparin therapy.  Nitroglycerin and dopamine ordered, not currently being used.  Analgesia with morphine, Dilaudid, Tylenol  GI prophylaxis not indicated, famotidine ordered  DVT prophylaxis with heparin drip     Intervention Category Evaluation Type: New Patient Evaluation  Brendan Williams 05/23/2023, 9:28 PM

## 2023-05-23 NOTE — Anesthesia Procedure Notes (Signed)
Procedure Name: Intubation Date/Time: 05/23/2023 1:06 PM  Performed by: Morene Crocker, CRNAPre-anesthesia Checklist: Patient identified, Patient being monitored, Timeout performed, Emergency Drugs available and Suction available Patient Re-evaluated:Patient Re-evaluated prior to induction Oxygen Delivery Method: Circle system utilized Preoxygenation: Pre-oxygenation with 100% oxygen Induction Type: IV induction Ventilation: Mask ventilation without difficulty Laryngoscope Size: 3 and McGraph Grade View: Grade I Tube type: Oral Tube size: 7.0 mm Number of attempts: 1 Airway Equipment and Method: Stylet Placement Confirmation: ETT inserted through vocal cords under direct vision, positive ETCO2 and breath sounds checked- equal and bilateral Secured at: 22 cm Tube secured with: Tape Dental Injury: Teeth and Oropharynx as per pre-operative assessment  Comments: Smooth atraumatic intubation, no complications noted

## 2023-05-23 NOTE — Progress Notes (Signed)
Consent for procedure signed and placed in chart

## 2023-05-23 NOTE — Plan of Care (Signed)
  Problem: Education: Goal: Knowledge of General Education information will improve Description: Including pain rating scale, medication(s)/side effects and non-pharmacologic comfort measures Outcome: Progressing   Problem: Health Behavior/Discharge Planning: Goal: Ability to manage health-related needs will improve Outcome: Progressing   Problem: Clinical Measurements: Goal: Ability to maintain clinical measurements within normal limits will improve Outcome: Progressing Goal: Will remain free from infection Outcome: Progressing Goal: Diagnostic test results will improve Outcome: Progressing Goal: Respiratory complications will improve Outcome: Progressing Goal: Cardiovascular complication will be avoided Outcome: Progressing   Problem: Activity: Goal: Risk for activity intolerance will decrease Outcome: Progressing   Problem: Nutrition: Goal: Adequate nutrition will be maintained Outcome: Progressing   Problem: Coping: Goal: Level of anxiety will decrease Outcome: Progressing   Problem: Elimination: Goal: Will not experience complications related to bowel motility Outcome: Progressing Goal: Will not experience complications related to urinary retention Outcome: Progressing   Problem: Pain Managment: Goal: General experience of comfort will improve Outcome: Progressing   Problem: Safety: Goal: Ability to remain free from injury will improve Outcome: Progressing   Problem: Skin Integrity: Goal: Risk for impaired skin integrity will decrease Outcome: Progressing   Problem: Education: Goal: Knowledge of prescribed regimen will improve Outcome: Progressing   Problem: Activity: Goal: Ability to tolerate increased activity will improve Outcome: Progressing   Problem: Bowel/Gastric: Goal: Gastrointestinal status for postoperative course will improve Outcome: Progressing   Problem: Clinical Measurements: Goal: Postoperative complications will be avoided or  minimized Outcome: Progressing Goal: Signs and symptoms of graft occlusion will improve Outcome: Progressing   Problem: Skin Integrity: Goal: Demonstration of wound healing without infection will improve Outcome: Progressing   

## 2023-05-23 NOTE — Transfer of Care (Signed)
Immediate Anesthesia Transfer of Care Note  Patient: MISTER HUCKLE  Procedure(s) Performed: ENDARTERECTOMY FEMORAL REDO (Right) APPLICATION OF CELL SAVER (Right) ENDOVASCULAR REPAIR/STENT GRAFT (Right: Leg Upper) ARTHERECTOMY EACH ADDITIONAL VISCERAL ARTERY (Right) MECHANICAL THROMBECTOMY right popliteal artery  Patient Location: PACU  Anesthesia Type:General  Level of Consciousness: awake  Airway & Oxygen Therapy: Patient Spontanous Breathing  Post-op Assessment: Report given to RN and Post -op Vital signs reviewed and stable  Post vital signs: Reviewed  Last Vitals:  Vitals Value Taken Time  BP 133/90 05/23/23 1846  Temp 22F   Pulse 78 05/23/23 1847  Resp 15   SpO2 100 % 05/23/23 1840  Vitals shown include unfiled device data.  Last Pain:  Vitals:   05/23/23 1203  TempSrc: Oral  PainSc: 10-Worst pain ever      Patients Stated Pain Goal: 0 (05/22/23 0329)  Complications: No notable events documented.

## 2023-05-23 NOTE — Op Note (Signed)
OPERATIVE NOTE   PROCEDURE: 1.   Right common femoral and superficial femoral artery endarterectomies and patch angioplasty 2.   Redo surgery right femoral artery exposure 3.   Right lower extremity angiogram 4.   Mechanical thrombectomy using the penumbra CAT 6 device to the right anterior tibial and dorsalis pedis arteries 5.   Atherectomy of the right SFA and popliteal arteries using the 8 [place] Rex device 6.   Angioplasty of the right anterior tibial artery with 3 mm diameter by 30 cm length angioplasty balloon 7.   Stent placement x 3 to the right SFA and popliteal arteries using 6 mm diameter by 25 cm length, 7 mm diameter by 15 cm length, an 8 mm diameter by 10 cm length Viabahn stents 8.   Separate and distinct right profunda femoris endarterectomy with separate patch angioplasty 9.   Disposable/incisional VAC dressing placement right groin incision    PRE-OPERATIVE DIAGNOSIS: 1.Atherosclerotic occlusive disease right lower extremities with rest pain and gangrenous changes to the toes   POST-OPERATIVE DIAGNOSIS: Same  SURGEON: Festus Barren, MD  CO-surgeon: Levora Dredge, MD  ANESTHESIA:  general  ESTIMATED BLOOD LOSS: 450 cc  FINDING(S): 1.  significant plaque in right common femoral, profunda femoris, and superficial femoral arteries 2.  Occlusion of right SFA and popliteal stents 3.  Diffusely diseased anterior tibial artery with multiple areas of stenosis and occlusion with thrombus in the distal anterior tibial artery and dorsalis pedis artery  SPECIMEN(S):  Right common femoral, profunda femoris, and superficial femoral artery plaque.  INDICATIONS:    Patient presents with acute on chronic rest pain of the right lower extremity with occlusion of his multiple previous lower extremity interventions, high-grade stenosis of the profunda femoris artery after previous femoral endarterectomy and gangrenous changes to toes on the right foot.  Right femoral  endarterectomy as well as endovascular intervention for the infrainguinal disease is planned to try to improve perfusion.  The risks and benefits as well as alternative therapies including intervention were reviewed in detail all questions were answered the patient agrees to proceed with surgery.  DESCRIPTION: After obtaining full informed written consent, the patient was brought back to the operating room and placed supine upon the operating table.  The patient received IV antibiotics prior to induction.  After obtaining adequate anesthesia, the patient was prepped and draped in the standard fashion appropriate time out is called.    Vertical incision was created overlying the right femoral arteries.  This was through the previous scar from his previous femoral endarterectomy.  The extensive scar tissue made the dissection extremely tedious and difficult.  The common femoral artery proximally, and superficial femoral artery, and primary profunda femoris artery branches were encircled with vessel loops and prepared for control.  I got out beyond the secondary branches of the profunda femoris artery as this was clearly very diseased on the angiogram.  The right femoral arteries were found to have significant plaque from the common femoral artery into the profunda and superficial femoral arteries.  The stents in the proximal SFA were occluded and I felt our best option for getting the SFA and popliteal arteries open would be to open the common femoral artery medially down onto the SFA.  We would be unable to address the profunda femoris artery stenosis with this approach, but it would allow intraluminal treatment of the SFA stents clearly.  A separate distinct profunda femoris artery endarterectomy would be planned.   6000 units of heparin was given  and allowed circulate for 5 minutes.  Additional heparin was given during the procedure as needed.  Attention is then turned to the right femoral artery.  An  arteriotomy is made with 11 blade and extended with Potts scissors in the common femoral artery and carried down onto the first 4-5 cm of the superficial femoral artery. An endarterectomy was then performed. The [facility] was used to create a plane. The proximal endpoint was cut flush with tenotomy scissors. This was in the proximal common femoral artery.  The most proximal Viabahn stent in the right SFA was then removed and the second stent in the SFA was opened proximally anteriorly.  This allowed [place] access and an 8 French sheath was then placed down into the SFA from the arteriotomy.  Imaging of the right lower extremity was then performed showing extensive thrombosis throughout the right lower extremity with essentially no flow distally.  I then used a Kumpe catheter and an advantage wire and cross the occlusion in the right SFA and popliteal arteries and got down into the proximal anterior tibial artery.  Imaging was performed showing diffuse disease of the anterior tibial artery with multiple areas of greater than 70% stenosis in the proximal and mid segment and then occlusion of the distal anterior tibial artery abruptly with what appeared to be thrombus.  I then placed a V18 wire into the foot.  The 8 [place] Rex device was then brought onto the field and performed atherectomy within the previously placed stents in the right SFA and popliteal arteries and down just below the stents into the mid popliteal artery.  Large chunks of thrombus and hyperplasia were removed.  There remained minimal flow distally.  Angioplasty was then performed in the anterior tibial artery from the distal anterior tibial artery up to the popliteal artery with an inflation with a 3 mm diameter by 30 cm length angioplasty balloon inflated to 10 atmospheres for 1 minute.  Another pass was then made with the [name] 8 [place] device in the right SFA and popliteal arteries removing more thrombus and hyperplasia but there  remained poor flow.  There is also a very high-grade stenosis at the distal edge of the previously placed stent in the popliteal artery.  I elected to reline from the mid to distal popliteal artery up to the proximal SFA with new stents.  A 6 mm diameter by 25 cm length Viabahn stent was placed first distally in the mid to distal popliteal artery and up to the distal SFA.  A 7 mm diameter by 15 cm length Viabahn stent was then placed in the SFA with a couple of centimeters overlap to the 6 mm stent.  Finally, an 8 mm diameter by 10 cm length Viabahn stent was deployed up to the most distal portion of the arteriotomy in the proximal SFA.  These were postdilated with 5 mm diameter Lutonix drug-coated angioplasty balloon distally and 7 mm diameter Lutonix drug-coated angioplasty balloon throughout the remainder of the stents.  Completion imaging showed no significant residual stenosis in the SFA or popliteal arteries but there remained very sluggish flow due to the thrombus in the distal anterior tibial artery.  I felt at this point, the only option would be to try to perform thrombectomy down in the distal anterior tibial artery and dorsalis pedis artery.  A penumbra CAT 6 device was then taken over the V18 wire down into the distal anterior tibial artery and dorsalis pedis artery.  The  wire was removed to allow a larger lumen for aspiration.  5 passes were made with the penumbra CAT 6 device performing mechanical thrombectomy on the distal anterior tibial artery and dorsalis pedis artery with restoration of flow although there was a large amount of spasm in the vessel.  The areas in the anterior tibial artery treated with angioplasty all appear to be markedly improved with less than 30% residual stenosis.  We then removed the sheath and turned our attention to closing the arteriotomy in the SFA and common femoral artery.  The edge of the previous Viabahn stents were then tacked down with a series of interrupted 6-0  Prolene sutures as tacking sutures for the distal endpoint of the endarterectomy.  The Bovine pericardial patcth is then selected and prepared for a patch angioplasty.  It is cut and beveled and started at the proximal endpoint with a 5-0 Prolene suture.  Approximately one half of the suture line is run medially and laterally and the distal end point was cut and bevelled to match the arteriotomy.  A second 5-0 Prolene was started at the distal end point and run to the mid portion to complete the arteriotomy.  Care was taken to make sure that the new Viabahn stents were tacked down with the 5-0 Prolene suture.  The vessel was flushed prior to release of control and completion of the anastomosis.  On release, the medial wall of the common femoral artery had a large rent that required repair.  His tissue was extremely friable and the scar tissue was extensive making this extremely difficult.  A piece of the bovine pericardial patch was then brought onto the field.  We patch this area on the medial common femoral artery with a 5-0 Prolene and the bovine pericardial patch.  This was started at the distal endpoint of this rent and run one half the length of the opening.  It was then cut and beveled to match the arteriotomy and a second 5-0 Prolene was started at the proximal endpoint.  On release of control, a few areas quired 6-0 Prolene patch sutures for hemostasis in the common femoral and superficial femoral arteries. We then turned our attention to the profunda femoris artery which would require a separate distinct endarterectomy for the disease.  The profunda femoris branches were controlled with Vesseloops and we reclamped the common femoral artery and SFA.  An arteriotomy was created with an 11 blade on the third profunda femoris artery branch and taken up to the main profunda femoris artery near its origin.  Plaque was then removed and an area of hyperplasia in the proximal portion from his previous surgery was  identified and cleared.  We then patch this arteriotomy in the profunda femoris artery with a separate piece of bovine pericardium to perform a patch angioplasty.  The vessel was more healthy and normal distally and a 6-0 Prolene was used starting at the distal endpoint and run approximately one half the length of the arteriotomy.  Due to the thick scar tissue in the more proximal portion of the profunda femoris artery, a 5-0 Prolene was started at the proximal endpoint after cutting and beveling the bovine pericardial patch to match the arteriotomy.  The vessel was flushed and de-aired on release of control.  After release, the distal profunda femoris artery branches had significant spasm from the Vesseloops.  This was treated with topical papaverine and this seemed to improve the flow in the profunda femoris artery distally.  6-0 Prolene  patch sutures were used for hemostasis and hemostasis was achieved.  Fibrillar and Vistacel topical hemostatic agents were placed in the femoral incision and hemostasis was complete. The femoral incision was then closed in a layered fashion with 2 layers of 2-0 Vicryl, 2 layers of 3-0 Vicryl, and staples for the skin closure. Celerate was used facilitate wound closure at each level after the deep layer was closed.  An incisional (disposable) VAC was then brought onto the field.  The sponge was placed over the wound and a good occlusive seal was obtained with adhesive dressing.  Suction tubing was connected and turned on and a good occlusive seal was obtained.  The patient was then awakened from anesthesia and taken to the recovery room in stable condition having tolerated the procedure well.  COMPLICATIONS: None  CONDITION: Stable     Festus Barren 05/23/2023 6:33 PM   This note was created with Dragon Medical transcription system. Any errors in dictation are purely unintentional.

## 2023-05-23 NOTE — Anesthesia Procedure Notes (Signed)
Arterial Line Insertion Start/End8/04/2023 1:20 PM, 05/23/2023 1:32 PM Performed by: Rosaria Ferries, MD, anesthesiologist  Patient location: OR. Preanesthetic checklist: patient identified, IV checked, site marked, risks and benefits discussed, surgical consent, monitors and equipment checked, pre-op evaluation, timeout performed and anesthesia consent Left, radial was placed Catheter size: 20 G Hand hygiene performed  and maximum sterile barriers used   Attempts: 1 Procedure performed using ultrasound guided technique. Ultrasound Notes:anatomy identified, needle tip was noted to be adjacent to the nerve/plexus identified and no ultrasound evidence of intravascular and/or intraneural injection Following insertion, Biopatch. Post procedure assessment: normal and unchanged  Patient tolerated the procedure well with no immediate complications. Additional procedure comments:  Allen's test was performed to ensure adequate perfusion. The patient's wrist was prepped and draped in sterile fashion.  A 20 G Arrow arterial line was introduced into the radial artery. The catheter was threaded over the guide wire and the needle was removed with appropriate pulsatile blood return. The catheter was then secured with a sterile Tegaderm dressing. Perfusion to the extremity distal to the point of catheter insertion was checked and found to be adequate. Attending was present for the entire procedure.  Estimated Blood Loss: minimal  The patient tolerated the procedure well and there were no immediate complications  .

## 2023-05-23 NOTE — Progress Notes (Signed)
PROGRESS NOTE    Brendan Williams  ZOX:[id number] DOB: July 20, 1961 DOA: 05/18/2023 PCP: Gildardo Pounds, PA    Brief Narrative:     Brendan Williams is a 62 y.o. male with medical history significant of severe PAD s/p stenting (Most recent April 2023), tobacco use disorder, neuropathy, who presented to the ED due to right foot pain and redness.  He had been experiencing pain in the right foot since he recently saw his vascular surgeon.  However, pain in the right foot had progressively worsened about 3 to 4 days prior to admission.  Noticed purplish/redness discoloration on the dorsal aspect of the right foot and toes.  He also endorsed fever at home.     Workup revealed right foot cellulitis and critical right limb ischemia.  Status post angiography with vascular surgery on 8/5.    Plan for femoral endarterectomy with possible stent intervention on 8/7   Assessment & Plan:   Principal Problem:   Critical limb ischemia of right lower extremity (HCC) Active Problems:   Tobacco use disorder   Cellulitis   Cellulitis and abscess of toe of right foot  PVD, critical limb ischemia of right lower extremity:  Unable to exclude overlying cellulitis Status post angiography with endovascular surgery on 8/5 Plan: Continue heparin GTT Continue aspirin and Lipitor Follow-up vascular surgery Return to the OR today for femoral endarterectomy     Right foot cellulitis:  Continue IV ceftriaxone and vancomycin.   No growth on blood cultures thus far. Continue broad-spectrum coverage for today.  Anticipate de-escalation on 8/8     Hypotension: Improved     Tobacco use disorder: Counseled to quit smoking cigarettes.    DVT prophylaxis: IV heparin Code Status: Full Family Communication: Spouse at bedside 8/6, 8/7 Disposition Plan: Status is: Inpatient Remains inpatient appropriate because: Critical limb ischemia.  Lower extremity cellulitis on IV antibiotics   Level of care:  Progressive  Consultants:  Vascular surgery  Procedures:  Lower extremity angiogram 8/5  Antimicrobials: Vancomycin Ceftriaxone   Subjective: Seen and examined.  Some increased pain in the right foot today  Objective: Vitals:   05/22/23 2137 05/22/23 2352 05/23/23 0344 05/23/23 0834  BP: 109/78 99/81 113/79 (!) 103/57  Pulse: (!) 50 63 (!) 59 70  Resp: 18 18 18 20   Temp: 98 F (36.7 C) 98.4 F (36.9 C) (!) 97.5 F (36.4 C) 98.3 F (36.8 C)  TempSrc:      SpO2: 96% 100% 97% 97%  Weight:      Height:        Intake/Output Summary (Last 24 hours) at 05/23/2023 1139 Last data filed at 05/23/2023 0345 Gross per 24 hour  Intake --  Output 900 ml  Net -900 ml   Filed Weights   05/18/23 1251  Weight: 78.2 kg    Examination:  General exam: NAD Respiratory system: Coarse breath sounds bilaterally.  Normal work of breathing.  Room air Cardiovascular system: S1 and 2, RRR, no murmurs, no pedal edema Gastrointestinal system: Soft NT/ND, normal bowel sounds Central nervous system: Alert and oriented. No focal neurological deficits. Extremities: Symmetric 5 x 5 power. Skin: Purple discoloration of right foot and toes.  Status post toe amputations.  Right foot tender to palpation Psychiatry: Judgement and insight appear normal. Mood & affect appropriate.     Data Reviewed: I have personally reviewed following labs and imaging studies  CBC: Recent Labs  Lab 05/18/23 1253 05/19/23 0517 05/20/23 0411 05/21/23 0547 05/22/23 8119  WBC 13.6* 11.4* 10.2 9.6 10.1  HGB 14.6 13.1 12.7* 12.7* 13.8  HCT 43.7 39.4 37.5* 37.7* 42.2  MCV 93.6 95.4 92.6 92.9 94.2  PLT 410* 369 350 379 379   Basic Metabolic Panel: Recent Labs  Lab 05/18/23 1253 05/19/23 0517 05/20/23 0411 05/23/23 0524  NA 131* 137  --  136  K 3.8 3.6  --  3.4*  CL 94* 105  --  106  CO2 27 23  --  23  GLUCOSE 99 96  --  99  BUN 8 6*  --  <5*  CREATININE 0.83 0.65 0.77 0.68  CALCIUM 9.0 8.4*  --  8.5*    GFR: Estimated Creatinine Clearance: 105.9 mL/min (by C-G formula based on SCr of 0.68 mg/dL). Liver Function Tests: Recent Labs  Lab 05/18/23 1253 05/19/23 0517  AST 16 13*  ALT 11 8  ALKPHOS 67 54  BILITOT 0.7 0.5  PROT 8.0 6.3*  ALBUMIN 3.7 2.9*   No results for input(s): "LIPASE", "AMYLASE" in the last 168 hours. No results for input(s): "AMMONIA" in the last 168 hours. Coagulation Profile: Recent Labs  Lab 05/18/23 1253  INR 1.1   Cardiac Enzymes: No results for input(s): "CKTOTAL", "CKMB", "CKMBINDEX", "TROPONINI" in the last 168 hours. BNP (last 3 results) No results for input(s): "PROBNP" in the last 8760 hours. HbA1C: No results for input(s): "HGBA1C" in the last 72 hours. CBG: Recent Labs  Lab 05/21/23 0455  GLUCAP 99   Lipid Profile: No results for input(s): "CHOL", "HDL", "LDLCALC", "TRIG", "CHOLHDL", "LDLDIRECT" in the last 72 hours. Thyroid Function Tests: No results for input(s): "TSH", "T4TOTAL", "FREET4", "T3FREE", "THYROIDAB" in the last 72 hours. Anemia Panel: No results for input(s): "VITAMINB12", "FOLATE", "FERRITIN", "TIBC", "IRON", "RETICCTPCT" in the last 72 hours. Sepsis Labs: No results for input(s): "PROCALCITON", "LATICACIDVEN" in the last 168 hours.  Recent Results (from the past 240 hour(s))  Culture, blood (single)     Status: None   Collection Time: 05/18/23  3:15 PM   Specimen: BLOOD LEFT ARM  Result Value Ref Range Status   Specimen Description BLOOD LEFT ARM  Final   Special Requests   Final    BOTTLES DRAWN AEROBIC AND ANAEROBIC Blood Culture adequate volume   Culture   Final    NO GROWTH 5 DAYS Performed at [facility], [address], [place], [place] [address]    Report Status 05/23/2023 FINAL  Final         Radiology Studies: PERIPHERAL VASCULAR CATHETERIZATION  Result Date: 05/21/2023 See surgical note for result.       Scheduled Meds:  sodium chloride   Intravenous Once   aspirin EC  81 mg  Oral Daily   atorvastatin  40 mg Oral Daily   Chlorhexidine Gluconate Cloth  6 each Topical Once   sodium chloride flush  3 mL Intravenous Q12H   Continuous Infusions:  sodium chloride      ceFAZolin (ANCEF) IV     cefTRIAXone (ROCEPHIN)  IV 1 g (05/22/23 1200)   heparin 2,100 Units/hr (05/23/23 1003)   vancomycin 1,250 mg (05/23/23 0412)     LOS: 5 days      Tresa Moore, MD [facility]   If 7PM-7AM, please contact night-coverage  05/23/2023, 11:39 AM

## 2023-05-23 NOTE — Interval H&P Note (Signed)
History and Physical Interval Note:  05/23/2023 8:22 AM  Brendan Williams  has presented today for surgery, with the diagnosis of ASO with rest pain.  The various methods of treatment have been discussed with the patient and family. After consideration of risks, benefits and other options for treatment, the patient has consented to  Procedure(s): ENDARTERECTOMY FEMORAL REDO (Right) APPLICATION OF CELL SAVER (Right) as a surgical intervention.  The patient's history has been reviewed, patient examined, no change in status, stable for surgery.  I have reviewed the patient's chart and labs.  Questions were answered to the patient's satisfaction.     Festus Barren

## 2023-05-23 NOTE — Consult Note (Signed)
Pharmacy Consult Note - Anticoagulation  Pharmacy Consult for heparin Indication: PAD with gangrenous changes.   PATIENT MEASUREMENTS: Height: 6\' 1"  (185.4 cm) Weight: 78.2 kg (172 lb 6.4 oz) IBW/kg (Calculated) : 79.9 HEPARIN DW (KG): 78.2  VITAL SIGNS: Temp: 98.3 F (36.8 C) (08/07 0834) BP: 103/57 (08/07 0834) Pulse Rate: 70 (08/07 0834)  Recent Labs    05/22/23 0307 05/23/23 0524 05/23/23 0817  HGB 13.8  --   --   HCT 42.2  --   --   PLT 379  --   --   HEPARINUNFRC 0.52  --  0.71*  CREATININE  --  0.68  --     Estimated Creatinine Clearance: 105.9 mL/min (by C-G formula based on SCr of 0.68 mg/dL).  PAST MEDICAL HISTORY: Past Medical History:  Diagnosis Date   Peripheral arterial disease [facility])     ASSESSMENT: 62 y.o. male with PMH PAD s/p left toe amputation, ischemic pain, neuropathy is presenting with right foot pain and swelling, reportedly turning black, concerning for limb ischemia. Patient is not on chronic anticoagulation per chart review. Pharmacy has been consulted to initiate and manage heparin intravenous infusion. S/p lower extremity angiography and plan for endarterectomy 8/7.   Pertinent medications: bASA daily No chronic anticoag PTA per chart review  Goal(s) of therapy: Heparin level 0.3 - 0.7 units/mL Monitor platelets by anticoagulation protocol: Yes   Baseline anticoagulation labs: Recent Labs    05/21/23 0547 05/22/23 0307  HGB 12.7* 13.8  PLT 379 379    Date Time aPTT/HL Rate/Comment 8/2       2212     < 0.1              1300 units/hr - gtt paused for MRI 8/3       0517        0.14            1300 units/hr  8/3 1245      <0.10  Subtherapeutic,1600>1850 u/hr 8/3 2040      0.27 Subtherapeutic 1850>2000 u/hr 8/4       0411         0.44           Therapeutic X 1  8/4 1015      0.26 Subtherapeutic 2000>2150 u/hr 8/4 1840      0.51 Therapeutic x 1 8/5       0058         0.60           Therapeutic X 2  8/6 0307      0.52 8/7 0817        0.71  PLAN: Heparin level is slightly supratherapeutic. Will decrease heparin to 2100 units/hr. Recheck heparin level in 6 hours and CBC with AM labs.    Ronnald Ramp, PharmD, BCPS Clinical Pharmacist 05/23/2023 9:35 AM

## 2023-05-24 ENCOUNTER — Encounter: Payer: Self-pay | Admitting: Vascular Surgery

## 2023-05-24 DIAGNOSIS — I70221 Atherosclerosis of native arteries of extremities with rest pain, right leg: Secondary | ICD-10-CM | POA: Diagnosis not present

## 2023-05-24 LAB — HEPARIN LEVEL (UNFRACTIONATED)
Heparin Unfractionated: 0.71 IU/mL — ABNORMAL HIGH (ref 0.30–0.70)
Heparin Unfractionated: 0.6 IU/mL (ref 0.30–0.70)

## 2023-05-24 LAB — GLUCOSE, CAPILLARY: Glucose-Capillary: 101 mg/dL — ABNORMAL HIGH (ref 70–99)

## 2023-05-24 MED ORDER — FAMOTIDINE 20 MG PO TABS
20.0000 mg | ORAL_TABLET | Freq: Two times a day (BID) | ORAL | Status: DC
  Administered 2023-05-24 – 2023-05-29 (×10): 20 mg via ORAL
  Filled 2023-05-24 (×10): qty 1

## 2023-05-24 NOTE — Progress Notes (Signed)
  Progress Note    05/24/2023 11:09 AM 1 Day Post-Op  Subjective:  [name] is a 62 yo male now POD#1 from right lower extremity common femoral and superficial endarterectomies with mechanical thrombectomy of the right anterior tibial and dorsalis pedis arteries.   On exam this morning patient is sitting up in a bedside chair.  Patient endorses his right lower extremity is warmer and feels much better.  Patient's toes remain cool to touch, purple and mottled.  Patient has bounding pulses in his right lower extremity that are palpable.  Patient does endorse some pain mostly in his right groin.  No complaints overnight.  Vitals all remained stable.   Vitals:   05/24/23 0510 05/24/23 0600  BP: 94/71 100/71  Pulse: 69 63  Resp: 17 15  Temp:    SpO2: 99% 94%   Physical Exam: Cardiac:  RRR, Normal S1, S2. No murmurs Lungs:  Scattered Rhonchi on auscultation. Normal respiratory effort.  Incisions:  Right Groin incision with Prevena Wound Vac in place. No hematoma to note. Left Groin from prior angio. With dressing Clean dry and intact. No hematoma to note.  Extremities:  Left lower extremity with palpable pulses and warm to touch. Right lower extremity with strong lower doppler pulses. Warm to touch from mid calf to mid foot.  Abdomen:  Positive bowel sounds throughout, soft, non tender and non distended.  Neurologic: AAOx3 follows commands and answers questions appropriately.     CBC    Component Value Date/Time   WBC 14.3 (H) 05/24/2023 0352   RBC 3.59 (L) 05/24/2023 0352   HGB 11.2 (L) 05/24/2023 0352   HCT 32.4 (L) 05/24/2023 0352   PLT 325 05/24/2023 0352   MCV 90.3 05/24/2023 0352   MCH 31.2 05/24/2023 0352   MCHC 34.6 05/24/2023 0352   RDW 13.1 05/24/2023 0352    BMET    Component Value Date/Time   NA 135 05/24/2023 0012   K 3.9 05/24/2023 0012   CL 106 05/24/2023 0012   CO2 17 (L) 05/24/2023 0012   GLUCOSE 211 (H) 05/24/2023 0012   BUN 7 (L) 05/24/2023 0012    CREATININE 0.75 05/24/2023 0012   CALCIUM 8.0 (L) 05/24/2023 0012   GFRNONAA >60 05/24/2023 0012   GFRAA >60 12/04/2019 0523    INR    Component Value Date/Time   INR 1.1 05/18/2023 1253     Intake/Output Summary (Last 24 hours) at 05/24/2023 1109 Last data filed at 05/24/2023 5284 Gross per 24 hour  Intake 5302.66 ml  Output 3975 ml  Net 1327.66 ml     Assessment/Plan:  62 y.o. male is s/p POD#1 from right lower extremity common femoral and superficial endarterectomies with mechanical thrombectomy of the right anterior tibial and dorsalis pedis arteries.   1 Day Post-Op   PLAN: Consult to Podiatry for patients toes.  Heparin Infusion will continue until seen by Podiatry. Pain medication PRN PT/OT OOB with assistance Q 4hrs Pulse checks.  Advance diet as tolerated.   DVT prophylaxis:  Heparin Infusion   [facility] 05/24/2023 11:09 AM

## 2023-05-24 NOTE — Progress Notes (Signed)
Daily Progress Note   Subjective  - 1 Day Post-Op  Follow-up right foot l critical imb ischemia.  Asked to see patient in regards to possible digital amputation for gangrenous changes.  Patient underwent endarterectomy and thrombectomy to the right lower extremity.  Patient is status post fifth ray amputation remotely.            Objective Vitals:   05/24/23 0600 05/24/23 1200 05/24/23 1230 05/24/23 1430  BP: 100/71 106/74 118/72 100/74  Pulse: 63   (!) 57  Resp: 15 17 15 12   Temp:      TempSrc:      SpO2: 94%   92%  Weight:      Height:        Physical Exam: Nonpalpable pulses to the right foot.  The foot still appears to be dysvascular at this time.  There are noted purple discoloration of the third and fourth toes at this time without necrosis.  Superficial ulcerative area on the dorsal fourth toe is noted.  Of note there is marked erythema with a blood blister to the dorsal foot.  There is lymphangitic streaking to the right anterior lower leg.  See clinical pictures.  Laboratory CBC    Component Value Date/Time   WBC 14.3 (H) 05/24/2023 0352   HGB 11.2 (L) 05/24/2023 0352   HCT 32.4 (L) 05/24/2023 0352   PLT 325 05/24/2023 0352    BMET    Component Value Date/Time   NA 135 05/24/2023 0012   K 3.9 05/24/2023 0012   CL 106 05/24/2023 0012   CO2 17 (L) 05/24/2023 0012   GLUCOSE 211 (H) 05/24/2023 0012   BUN 7 (L) 05/24/2023 0012   CREATININE 0.75 05/24/2023 0012   CALCIUM 8.0 (L) 05/24/2023 0012   GFRNONAA >60 05/24/2023 0012   GFRAA >60 12/04/2019 0523    Assessment/Planning: Critical limb ischemia right lower extremity  At this time I would hold on amputation to the toes.  I would continue to monitor for further demarcation. I am quite concerned about this lower extremity as he has fairly diffuse erythema on the dorsal foot with lymphangitic streaking.  The skin still appears to be somewhat dysvascular in the area.  Is quite thin and shiny. I discussed  this with the patient that he is at high risk of amputation of the lower leg.  Continue to prevent any pressure and irritation from the region. Podiatry to follow at distance for now.    Gwyneth Revels A  05/24/2023, 2:40 PM

## 2023-05-24 NOTE — Consult Note (Signed)
Pharmacy Consult Note - Anticoagulation  Pharmacy Consult for heparin Indication: PAD with gangrenous changes.   PATIENT MEASUREMENTS: Height: 6\' 1"  (185.4 cm) Weight: 72.2 kg (159 lb 2.8 oz) IBW/kg (Calculated) : 79.9 HEPARIN DW (KG): 78.2  VITAL SIGNS: Temp: 98.3 F (36.8 C) (08/08 0345) Temp Source: Oral (08/08 0345) BP: 109/76 (08/08 0400) Pulse Rate: 70 (08/08 0400)  Recent Labs    05/24/23 0012 05/24/23 0352  HGB 11.5* 11.2*  HCT 33.1* 32.4*  PLT 328 325  HEPARINUNFRC  --  0.42  CREATININE 0.75  --     Estimated Creatinine Clearance: 97.8 mL/min (by C-G formula based on SCr of 0.75 mg/dL).  PAST MEDICAL HISTORY: Past Medical History:  Diagnosis Date   Peripheral arterial disease [facility])     ASSESSMENT: 62 y.o. male with PMH PAD s/p left toe amputation, ischemic pain, neuropathy is presenting with right foot pain and swelling, reportedly turning black, concerning for limb ischemia. Patient is not on chronic anticoagulation per chart review. Pharmacy has been consulted to initiate and manage heparin intravenous infusion. S/p lower extremity angiography and plan for endarterectomy 8/7.   Pertinent medications: bASA daily No chronic anticoag PTA per chart review  Goal(s) of therapy: Heparin level 0.3 - 0.7 units/mL Monitor platelets by anticoagulation protocol: Yes   Baseline anticoagulation labs: Recent Labs    05/22/23 0307 05/24/23 0012 05/24/23 0352  HGB 13.8 11.5* 11.2*  PLT 379 328 325    Date Time aPTT/HL Rate/Comment 8/2       2212     < 0.1              1300 units/hr - gtt paused for MRI 8/3       0517        0.14            1300 units/hr  8/3 1245      <0.10  Subtherapeutic,1600>1850 u/hr 8/3 2040      0.27 Subtherapeutic 1850>2000 u/hr 8/4       0411         0.44           Therapeutic X 1  8/4 1015      0.26 Subtherapeutic 2000>2150 u/hr 8/4 1840      0.51 Therapeutic x 1 8/5       0058         0.60           Therapeutic X 2  8/6 0307       0.52 8/7 0817       0.71 8/8 0352      0.42   PLAN: Heparin level therapeutic x 1. Will continue heparin at 2100 units/hr. Recheck heparin level in 6 hours to confirm. CBC daily with AM labs.   Otelia Sergeant, PharmD, [facility] 05/24/2023 4:26 AM

## 2023-05-24 NOTE — Consult Note (Signed)
Pharmacy Consult Note  Pharmacy Consult for IV Heparin Indication: PAD with gangrenous changes.   PATIENT MEASUREMENTS: Height: 6\' 1"  (185.4 cm) Weight: 72.2 kg (159 lb 2.8 oz) IBW/kg (Calculated) : 79.9 HEPARIN DW (KG): 78.2  Recent Labs    05/24/23 0012 05/24/23 0352 05/24/23 1011 05/24/23 1829  HGB 11.5* 11.2*  --   --   HCT 33.1* 32.4*  --   --   PLT 328 325  --   --   HEPARINUNFRC  --  0.42   < > 0.60  CREATININE 0.75  --   --   --    < > = values in this interval not displayed.    Estimated Creatinine Clearance: 97.8 mL/min (by C-G formula based on SCr of 0.75 mg/dL).  PAST MEDICAL HISTORY: Past Medical History:  Diagnosis Date   Peripheral arterial disease [facility])     ASSESSMENT: 62 y.o. male with PMH PAD s/p left toe amputation, Brendan Williams, Brendan Williams, Brendan Williams, Brendan for limb ischemia. Patient is not on chronic anticoagulation per chart review. Pharmacy has been consulted to initiate and manage heparin intravenous infusion. S/p lower extremity angiography and plan for endarterectomy 8/7.   Pertinent medications: ASA / Plavix No chronic anticoag PTA per chart review  Goal(s) of therapy: Heparin level 0.3 - 0.7 units/mL Monitor platelets by anticoagulation protocol: Yes   Baseline anticoagulation labs: Recent Labs    05/22/23 0307 05/24/23 0012 05/24/23 0352  HGB 13.8 11.5* 11.2*  PLT 379 328 325      PLAN: 8/8@1829 : HL 0.60, therapeutic x 1 --Will continue heparin infusion at 2050 units/hr --Check confirmatory HL in 6 hours --Daily CBC per protocol while on IV heparin  Bettey Costa 05/24/2023 6:52 PM

## 2023-05-24 NOTE — Op Note (Addendum)
OPERATIVE NOTE   PROCEDURE: 1.   Right common femoral and superficial femoral artery endarterectomies and patch angioplasty 2.   Redo surgery right femoral artery exposure 3.   Right lower extremity angiogram 4.   Mechanical thrombectomy using the penumbra CAT 6 device to the right anterior tibial and dorsalis pedis arteries 5.   Atherectomy of the right SFA and popliteal arteries using the 8 [place] Rex device 6.   Angioplasty of the right anterior tibial artery with 3 mm diameter by 30 cm length angioplasty balloon 7.   Stent placement x 3 to the right SFA and popliteal arteries using 6 mm diameter by 25 cm length, 7 mm diameter by 15 cm length, an 8 mm diameter by 10 cm length Viabahn stents 8.   Separate and distinct right profunda femoris endarterectomy with separate patch angioplasty 9.   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  ASSISTANT(S): None  ANESTHESIA: general  ESTIMATED BLOOD LOSS: 450 cc  FINDING(S): Profound plaque noted of the right common femoral extending past the initial bifurcation of the profunda femoris arteries as well as down the extensive length of the SFA. Occlusion of previously placed right SFA and popliteal stents. Diffuse occlusive disease of the anterior tibial artery associated with thrombus in the distal anterior tibial and dorsalis pedis  SPECIMEN(S):  Calcific plaque from the common femoral, superficial femoral and the profunda femoris artery  INDICATIONS:   Brendan Williams 62 y.o. y.o.male who presents with complaints of ischemic rest pain of the right foot associated with gangrenous changes.  The patient has documented severe atherosclerotic occlusive disease and has  undergone minimally invasive treatments in the past. However, at this point his primary area of stricture stenosis resides in the common femoral and origins of the superficial femoral and profunda femoris extending into these arteries and therefore this is not amenable to intervention. The patient is therefore undergoing open endarterectomy. The risks and benefits of surgery have been reviewed with the patient, all questions have answered; alternative therapies have been reviewed as well and the patient has agreed to proceed with surgical open repair.  DESCRIPTION: After obtaining full informed written consent, the patient was brought back to the operating room and placed supine upon the operating table.  The patient received IV antibiotics prior to induction.  After obtaining adequate anesthesia, the patient was prepped and draped in the standard fashion for right femoral exposure.  Co-surgeons are required because this is a complicated procedure with work being performed simultaneously from both the patient's right left sides.  This also expedites the procedure making a shorter operative time reducing complications and improving patient safety.  Attention was turned to the right groin with Dr. Wyn Quaker working on the right and myself working on the left of the patient.  Vertical  Incision was made over the right common femoral artery through the previous scar and dissection carried down to the common femoral artery with electrocautery.  I dissected out the common femoral artery from the distal external iliac artery (identified by the superficial circumflex vessels) down to the femoral bifurcation.  On initial inspection, the common femoral artery was: densely calcified and there was no palpable pulse noted.    Subsequently the dissection was continued to include all circumflex branches and the profunda femoral artery and superficial femoral  artery. The superficial femoral artery was dissected circumferentially for  a distance of approximately 3-4 cm and the profunda femoris was dissected circumferentially out to the fourth order branches individual vessel loops were placed around each branch.  Control of all branches was obtained with vessel loops.  A softer area in the distal external iliac artery amendable to clamping was identified.    The patient was given 6000 units of Heparin intravenously, which was a therapeutic bolus.   After waiting 3 minutes, the distal external iliac artery was clamped and all of the vessel loops were placed under tension.  Arteriotomy was made in the common femoral artery with a 11-blade and extended it with a Potts scissor proximally and distally extending the distal end down the SFA for approximately 5 cm.   Endarterectomy was then performed under direct visualization using a freer elevator and a right angle from the mid common femoral extending up both proximally and distally. Proximally the endarterectomy was brought up to the level of the clamp where a clean edge was obtained. Distally the endarterectomy was carried down into the SFA where where the most proximal Viabahn stent was removed in its entirety.  Scissors were then used to open the next Viabahn stent and create a suitable leading edge.  At this point we began the interventional portion of the case which was performed by Dr. Wyn Quaker, please refer to his note for further details.  Once the intervention had been completed a bovine pericardial patch was fashioned for the geometry of the arteriotomy.  The patch was sewn to the artery with 2 running stitches of 5-0 Prolene, running from each end.  Prior to completing the patch angioplasty, the profunda femoral artery was flushed as was the superficial femoral artery. The system was then forward flushed. The endarterectomy site was then irrigated copiously with heparinized saline. The patch angioplasty was completed in the usual fashion.  Flow was then reestablished first to the  profunda femoris and then the superficial femoral artery.  On inspection there appeared to be and a defect in the arterial wall posterior medially.  This was located within the common femoral.  A separate piece of the bovine pericardial patch was then trimmed to the appropriate length and sewn to repair this defect similar to the arteriotomy patch using running 5-0 Prolene.  Once again flow was reestablished first to the profundus and then the SFA and any gaps or bleeding sites in the suture line were easily controlled with a 6-0 Prolene suture.   Next we addressed the profunda femoris which was a separate and distinct lesion.  The common femoral artery was then clamped proximally and distally the profunda was clamped distally and the 11 blade was used to create an arteriotomy in the profunda.  This was then extended with Potts scissors.  Endarterectomy of the profunda femoris was then performed under direct visualization with a freer elevator.  Feathered edge was obtained distally.  A bovine pericardial patch was delivered onto the field and trimmed to the appropriate length and then applied using a 6-0 Prolene distally and a 5-0 Prolene proximally.  This was a separate and distinct patch and the profunda femoris completely independent of the common femoral and SFA patch.  Flushing maneuvers were performed and flow was reestablished distally.  Any gaps or bleeding points were then treated with interrupted 6-0 Prolene suture.  The right groin was then irrigated copiously with sterile saline and subsequently Evicel and Surgicel were placed in the wound.  Cellerate was also used to during closure.  The incision was repaired with a double layer of 2-0 Vicryl, a double layer of 3-0 Vicryl, and surgical staples.  The skin was cleaned, dried, and a Prevena VAC (disposable VAC) was then applied to the dressing  COMPLICATIONS: None  CONDITION: Good  Renford Dills, M.D. [facility] Office:  [phone]  05/23/2023, 9:11 AM

## 2023-05-24 NOTE — Progress Notes (Deleted)
       CROSS COVER NOTE  NAME: Brendan Williams MRN: [id number] DOB : May 21, 1961    Concern as stated by nurse / staff        Pertinent findings on chart review: Hypotension noted 1800 88/62 and 1900 75/51Patient admitted with critical limb ischemia right leg and cellulitis right foot. Underwent right lower extremity angiogram with angioplasty, mechanical thrombectomy and 3 stents placed and endarterectomy  AM labs show bicarb 17 and anion gap of 12 with glucose 211   Assessment and  Interventions   Assessment:  Plan: X X X

## 2023-05-24 NOTE — Anesthesia Postprocedure Evaluation (Signed)
Anesthesia Post Note  Patient: Brendan Williams  Procedure(s) Performed: ENDARTERECTOMY FEMORAL REDO (Right) APPLICATION OF CELL SAVER (Right) ENDOVASCULAR REPAIR/STENT GRAFT (Right: Leg Upper) ARTHERECTOMY EACH ADDITIONAL VISCERAL ARTERY (Right) MECHANICAL THROMBECTOMY right popliteal artery  Anesthesia Type: General Anesthetic complications: no   No notable events documented.   Last Vitals:  Vitals:   05/24/23 0000 05/24/23 0100  BP: (!) 125/90   Pulse: 67 63  Resp: 13 19  Temp:    SpO2: 100% 100%    Last Pain:  Vitals:   05/24/23 0130  TempSrc:   PainSc: Asleep                 Foye Deer

## 2023-05-24 NOTE — Consult Note (Signed)
Pharmacy Consult Note  Pharmacy Consult for IV Heparin Indication: PAD with gangrenous changes.   PATIENT MEASUREMENTS: Height: 6\' 1"  (185.4 cm) Weight: 72.2 kg (159 lb 2.8 oz) IBW/kg (Calculated) : 79.9 HEPARIN DW (KG): 78.2  Recent Labs    05/24/23 0012 05/24/23 0352 05/24/23 1011  HGB 11.5* 11.2*  --   HCT 33.1* 32.4*  --   PLT 328 325  --   HEPARINUNFRC  --  0.42 0.71*  CREATININE 0.75  --   --     Estimated Creatinine Clearance: 97.8 mL/min (by C-G formula based on SCr of 0.75 mg/dL).  PAST MEDICAL HISTORY: Past Medical History:  Diagnosis Date   Peripheral arterial disease [facility])     ASSESSMENT: 62 y.o. male with PMH PAD s/p left toe amputation, ischemic pain, neuropathy is presenting with right foot pain and swelling, reportedly turning black, concerning for limb ischemia. Patient is not on chronic anticoagulation per chart review. Pharmacy has been consulted to initiate and manage heparin intravenous infusion. S/p lower extremity angiography and plan for endarterectomy 8/7.   Pertinent medications: ASA / Plavix No chronic anticoag PTA per chart review  Goal(s) of therapy: Heparin level 0.3 - 0.7 units/mL Monitor platelets by anticoagulation protocol: Yes   Baseline anticoagulation labs: Recent Labs    05/22/23 0307 05/24/23 0012 05/24/23 0352  HGB 13.8 11.5* 11.2*  PLT 379 328 325    PLAN: --Heparin level is slightly supratherapeutic --Will decrease heparin infusion to 2050 units/hr --Re-check HL 6 hours from rate change --Daily CBC per protocol while on IV heparin  Tressie Ellis 05/24/2023 1:18 PM

## 2023-05-24 NOTE — Progress Notes (Signed)
PROGRESS NOTE    Brendan Williams  ZOX:[id number] DOB: Oct 13, 1961 DOA: 05/18/2023 PCP: Gildardo Pounds, PA    Brief Narrative:     Brendan Williams is a 62 y.o. male with medical history significant of severe PAD s/p stenting (Most recent April 2023), tobacco use disorder, neuropathy, who presented to the ED due to right foot pain and redness.  He had been experiencing pain in the right foot since he recently saw his vascular surgeon.  However, pain in the right foot had progressively worsened about 3 to 4 days prior to admission.  Noticed purplish/redness discoloration on the dorsal aspect of the right foot and toes.  He also endorsed fever at home.     Workup revealed right foot cellulitis and critical right limb ischemia.  Status post angiography with vascular surgery on 8/5.    Status post femoral endarterectomy, lower extremity angiogram, mechanical thrombectomy, angioplasty with stent placement on 8/7.  Doing well postoperatively.   Assessment & Plan:   Principal Problem:   Critical limb ischemia of right lower extremity (HCC) Active Problems:   Tobacco use disorder   Cellulitis   Cellulitis and abscess of toe of right foot  PVD, critical limb ischemia of right lower extremity:  Unable to exclude overlying cellulitis Status post angiography with vascular surgery on 8/5 Status post right endarterectomies with angioplasty and stent placement 8/7 Plan: Continue heparin GTT Continue aspirin and Lipitor Follow-up vascular surgery recommendations Podiatry read paged for follow-up     Right foot cellulitis:  Continue IV ceftriaxone and vancomycin.   No growth on blood cultures thus far. Continue spectrum coverage for today pending podiatry evaluation and possible intervention     Hypotension: Improved     Tobacco use disorder: Counseled to quit smoking cigarettes.    DVT prophylaxis: IV heparin Code Status: Full Family Communication: Spouse at bedside 8/6,  8/7 Disposition Plan: Status is: Inpatient Remains inpatient appropriate because: Critical limb ischemia.  Lower extremity cellulitis on IV antibiotics   Level of care: ICU  Consultants:  Vascular surgery  Procedures:  Lower extremity angiogram 8/5  Antimicrobials: Vancomycin Ceftriaxone   Subjective: Seen and examined.  Appears more comfortable this morning.  Objective: Vitals:   05/24/23 0430 05/24/23 0500 05/24/23 0510 05/24/23 0600  BP:   94/71 100/71  Pulse: 65 81 69 63  Resp: 10 17 17 15   Temp:      TempSrc:      SpO2: 99% 98% 99% 94%  Weight:      Height:        Intake/Output Summary (Last 24 hours) at 05/24/2023 1101 Last data filed at 05/24/2023 0917 Gross per 24 hour  Intake 5302.66 ml  Output 3975 ml  Net 1327.66 ml   Filed Weights   05/18/23 1251 05/23/23 1203 05/23/23 1955  Weight: 78.2 kg 78.2 kg 72.2 kg    Examination:  General exam: No acute distress Respiratory system: Bibasilar crackles.  Normal work of breathing.  Room air Cardiovascular system: S1 and 2, RRR, no murmurs, no pedal edema Gastrointestinal system: Soft NT/ND, normal bowel sounds Central nervous system: Alert and oriented. No focal neurological deficits. Extremities: Symmetric 5 x 5 power. Skin: Purple discoloration of right foot and toes.  Status post toe amputations.  Right foot tender to palpation, pulses improved Psychiatry: Judgement and insight appear normal. Mood & affect appropriate.     Data Reviewed: I have personally reviewed following labs and imaging studies  CBC: Recent Labs  Lab 05/20/23  8295 05/21/23 0547 05/22/23 0307 05/24/23 0012 05/24/23 0352  WBC 10.2 9.6 10.1 12.9* 14.3*  HGB 12.7* 12.7* 13.8 11.5* 11.2*  HCT 37.5* 37.7* 42.2 33.1* 32.4*  MCV 92.6 92.9 94.2 90.7 90.3  PLT 350 379 379 328 325   Basic Metabolic Panel: Recent Labs  Lab 05/18/23 1253 05/19/23 0517 05/20/23 0411 05/23/23 0524 05/24/23 0012  NA 131* 137  --  136 135  K 3.8  3.6  --  3.4* 3.9  CL 94* 105  --  106 106  CO2 27 23  --  23 17*  GLUCOSE 99 96  --  99 211*  BUN 8 6*  --  <5* 7*  CREATININE 0.83 0.65 0.77 0.68 0.75  CALCIUM 9.0 8.4*  --  8.5* 8.0*   GFR: Estimated Creatinine Clearance: 97.8 mL/min (by C-G formula based on SCr of 0.75 mg/dL). Liver Function Tests: Recent Labs  Lab 05/18/23 1253 05/19/23 0517  AST 16 13*  ALT 11 8  ALKPHOS 67 54  BILITOT 0.7 0.5  PROT 8.0 6.3*  ALBUMIN 3.7 2.9*   No results for input(s): "LIPASE", "AMYLASE" in the last 168 hours. No results for input(s): "AMMONIA" in the last 168 hours. Coagulation Profile: Recent Labs  Lab 05/18/23 1253  INR 1.1   Cardiac Enzymes: No results for input(s): "CKTOTAL", "CKMB", "CKMBINDEX", "TROPONINI" in the last 168 hours. BNP (last 3 results) No results for input(s): "PROBNP" in the last 8760 hours. HbA1C: No results for input(s): "HGBA1C" in the last 72 hours. CBG: Recent Labs  Lab 05/21/23 0455 05/23/23 1934  GLUCAP 99 165*   Lipid Profile: No results for input(s): "CHOL", "HDL", "LDLCALC", "TRIG", "CHOLHDL", "LDLDIRECT" in the last 72 hours. Thyroid Function Tests: No results for input(s): "TSH", "T4TOTAL", "FREET4", "T3FREE", "THYROIDAB" in the last 72 hours. Anemia Panel: No results for input(s): "VITAMINB12", "FOLATE", "FERRITIN", "TIBC", "IRON", "RETICCTPCT" in the last 72 hours. Sepsis Labs: No results for input(s): "PROCALCITON", "LATICACIDVEN" in the last 168 hours.  Recent Results (from the past 240 hour(s))  Culture, blood (single)     Status: None   Collection Time: 05/18/23  3:15 PM   Specimen: BLOOD LEFT ARM  Result Value Ref Range Status   Specimen Description BLOOD LEFT ARM  Final   Special Requests   Final    BOTTLES DRAWN AEROBIC AND ANAEROBIC Blood Culture adequate volume   Culture   Final    NO GROWTH 5 DAYS Performed at [facility], [address], [place], [place] [address]    Report Status 05/23/2023 FINAL  Final          Radiology Studies: DG C-Arm 1-60 Min-No Report  Result Date: 05/23/2023 Fluoroscopy was utilized by the requesting physician.  No radiographic interpretation.   DG C-Arm 1-60 Min-No Report  Result Date: 05/23/2023 Fluoroscopy was utilized by the requesting physician.  No radiographic interpretation.        Scheduled Meds:  sodium chloride   Intravenous Once   aspirin EC  81 mg Oral Q0600   atorvastatin  40 mg Oral Daily   Chlorhexidine Gluconate Cloth  6 each Topical Daily   clopidogrel  75 mg Oral Daily   docusate sodium  100 mg Oral Daily   famotidine  20 mg Oral BID   gabapentin  300 mg Oral BID   sodium chloride flush  3 mL Intravenous Q12H   Continuous Infusions:  sodium chloride 100 mL/hr at 05/24/23 0915   sodium chloride  cefTRIAXone (ROCEPHIN)  IV Stopped (05/23/23 1221)   DOPamine     heparin 2,100 Units/hr (05/24/23 1015)   magnesium sulfate bolus IVPB     nitroGLYCERIN     vancomycin Stopped (05/24/23 0520)     LOS: 6 days      Tresa Moore, MD [facility]   If 7PM-7AM, please contact night-coverage  05/24/2023, 11:01 AM

## 2023-05-24 NOTE — Progress Notes (Signed)
PHARMACIST - PHYSICIAN COMMUNICATION  CONCERNING: IV to Oral Route Change Policy  RECOMMENDATION: This patient is receiving Pepcid by the intravenous route.  Based on criteria approved by the Pharmacy and Therapeutics Committee, the intravenous medication(s) is/are being converted to the equivalent oral dose form(s).  DESCRIPTION: These criteria include: The patient is eating (either orally or via tube) and/or has been taking other orally administered medications for a least 24 hours The patient has no evidence of active gastrointestinal bleeding or impaired GI absorption (gastrectomy, short bowel, patient on TNA or NPO).  If you have questions about this conversion, please contact the Pharmacy Department   Tressie Ellis, [facility] 05/24/2023 9:34 AM

## 2023-05-24 NOTE — Evaluation (Signed)
Physical Therapy Evaluation Patient Details Name: Brendan Williams MRN: [id number] DOB: 1961/07/03 Today's Date: 05/24/2023  History of Present Illness  Pt is a 62 y.o. male presenting to hospital 05/18/23 with worsening pain and redness to dorsum of R foot.  Recent diagnosis 7/17 of critical limb ischemia R LE.  Pt admitted with critical limb ischemia R LE, cellulitis.  S/p 8/5 aortogram and selective R LE angiogram.  S/p 8/7 right lower extremity common femoral and superficial endarterectomies with mechanical thrombectomy of the right anterior tibial and dorsalis pedis arteries.  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  Clinical Impression  Prior to hospital admission, pt was ambulatory with rollator household distances; lives with family.  7-8/10 R lower leg pain throughout session (pt received pain medication prior to session).  Currently pt is min assist with transfers and min to mod assist to ambulate 20 feet with RW use (pt requiring vc's and 2nd assist for safety, assist to keep RW closer, and assist to steady).  Pt keeping R LE NWB'ing throughout session d/t R LE pain.  Pt would currently benefit from skilled PT to address noted impairments and functional limitations (see below for any additional details).  Upon hospital discharge, pt would benefit from ongoing therapy.    If plan is discharge home, recommend the following: A lot of help with walking and/or transfers;A little help with bathing/dressing/bathroom;Assistance with cooking/housework;Assist for transportation;Help with stairs or ramp for entrance   Can travel by private vehicle   Yes    Equipment Recommendations Rolling walker (2 wheels);BSC/3in1;Wheelchair (measurements PT);Wheelchair cushion (measurements PT)  Recommendations for Other Services       Functional Status Assessment Patient has had a recent decline in their functional status and  demonstrates the ability to make significant improvements in function in a reasonable and predictable amount of time.     Precautions / Restrictions Precautions Precautions: Fall Restrictions Weight Bearing Restrictions: No      Mobility  Bed Mobility Overal bed mobility: Needs Assistance Bed Mobility: Sit to Supine       Sit to supine: Supervision, HOB elevated   General bed mobility comments: SBA for lines    Transfers Overall transfer level: Needs assistance Equipment used: None Transfers: Bed to chair/wheelchair/BSC, Sit to/from Stand Sit to Stand: Min assist     Squat pivot transfers: Min assist, +2 safety/equipment (2nd assist for lines)     General transfer comment: squat pivot recliner to bed towards R side; vc's for safety; pt keeping R LE NWB'ing d/t pain    Ambulation/Gait Ambulation/Gait assistance: Min assist, Mod assist, +2 safety/equipment Gait Distance (Feet): 20 Feet Assistive device: Rolling walker (2 wheels)   Gait velocity: decreased     General Gait Details: vc's and assist to stay closer to RW (nurse holding walker to prevent walker from going too far forward); pt keeping R LE NWB'ing; vc's for safety and technique  Stairs            Wheelchair Mobility     Tilt Bed    Modified Rankin (Stroke Patients Only)       Balance Overall balance assessment: Needs assistance Sitting-balance support: No upper extremity supported, Feet supported Sitting balance-Leahy Scale: Good Sitting balance - Comments: steady reaching within BOS   Standing balance support: Bilateral upper extremity supported, During functional activity, Reliant on assistive device for balance Standing balance-Leahy Scale: Poor Standing balance comment: assist for balance with dynamic  standing activities                             Pertinent Vitals/Pain Pain Assessment Pain Assessment: 0-10 Pain Score: 8  Pain Location: RLE Pain Descriptors /  Indicators: Burning, Aching, Throbbing Pain Intervention(s): Limited activity within patient's tolerance, Monitored during session, Premedicated before session, Repositioned Vitals (HR and SpO2 on room air) stable and WFL throughout treatment session.    Home Living Family/patient expects to be discharged to:: Private residence Living Arrangements: Spouse/significant other;Children (wife and 2 children) Available Help at Discharge: Family;Available PRN/intermittently Type of Home: Other(Comment) (Single wide) Home Access: Stairs to enter Entrance Stairs-Rails: Right;Left;Can reach both Entrance Stairs-Number of Steps: 3   Home Layout: One level Home Equipment: Grab bars - tub/shower;Rollator (4 wheels);BSC/3in1;Cane - single point Additional Comments: Pt puts BSC in shower.  Has walking stick.    Prior Function Prior Level of Function : Independent/Modified Independent;History of Falls (last six months)             Mobility Comments: Ambulatory with rollator household distances (shares with his wife).  Family will assist with sit to stand from couch as needed per pt report. ADLs Comments: In general pt MOD I with ADL's, assist with IADL's     Extremity/Trunk Assessment   Upper Extremity Assessment Upper Extremity Assessment: Overall WFL for tasks assessed    Lower Extremity Assessment Lower Extremity Assessment: RLE deficits/detail (L LE WFL) RLE Deficits / Details: at least 3/5 AROM hip flexion and knee flexion/extension; limited ankle AROM d/t pain (pt did not want his lower leg or foot touched d/t concerns of severe pain) RLE: Unable to fully assess due to pain LLE Deficits / Details: at least 3/5 AROM in general    Cervical / Trunk Assessment Cervical / Trunk Assessment: Normal  Communication   Communication Communication: No apparent difficulties Cueing Techniques: Verbal cues;Visual cues  Cognition Arousal: Alert Behavior During Therapy: Anxious,  Impulsive Overall Cognitive Status: Within Functional Limits for tasks assessed                                 General Comments: Vc's for safety        General Comments  Nursing cleared pt for participation in physical therapy.  Pt agreeable to PT session.    Exercises     Assessment/Plan    PT Assessment Patient needs continued PT services  PT Problem List Decreased strength;Decreased activity tolerance;Decreased balance;Decreased mobility;Decreased knowledge of use of DME;Decreased knowledge of precautions;Decreased skin integrity;Pain       PT Treatment Interventions DME instruction;Gait training;Stair training;Functional mobility training;Therapeutic activities;Therapeutic exercise;Balance training;Patient/family education;Wheelchair mobility training    PT Goals (Current goals can be found in the Care Plan section)  Acute Rehab PT Goals Patient Stated Goal: to have less pain PT Goal Formulation: With patient Time For Goal Achievement: 06/07/23 Potential to Achieve Goals: Fair    Frequency Min 1X/week     Co-evaluation               AM-PAC PT "6 Clicks" Mobility  Outcome Measure Help needed turning from your back to your side while in a flat bed without using bedrails?: None Help needed moving from lying on your back to sitting on the side of a flat bed without using bedrails?: A Little Help needed moving to and from a bed to a chair (including a  wheelchair)?: A Little Help needed standing up from a chair using your arms (e.g., wheelchair or bedside chair)?: A Little Help needed to walk in hospital room?: A Lot Help needed climbing 3-5 steps with a railing? : Total 6 Click Score: 16    End of Session Equipment Utilized During Treatment: Gait belt Activity Tolerance: Patient limited by pain;Patient limited by fatigue Patient left: in bed;with call bell/phone within reach;with bed alarm set;with nursing/sitter in room Nurse Communication:  Mobility status;Precautions;Other (comment) (Pt's pain status) PT Visit Diagnosis: Unsteadiness on feet (R26.81);Other abnormalities of gait and mobility (R26.89);Muscle weakness (generalized) (M62.81);Pain Pain - Right/Left: Right Pain - part of body: Leg    Time: 5284-1324 PT Time Calculation (min) (ACUTE ONLY): 24 min   Charges:   PT Evaluation $PT Eval Low Complexity: 1 Low PT Treatments $Gait Training: 8-22 mins PT General Charges $$ ACUTE PT VISIT: 1 Visit       Hendricks Limes, PT 05/24/23, 2:26 PM

## 2023-05-24 NOTE — TOC Initial Note (Signed)
Transition of Care [facility]) - Initial/Assessment Note    Patient Details  Name: Brendan Williams MRN: [id number] Date of Birth: 10-Nov-1960  [facility] CM/SW Contact:    Kreg Shropshire, RN Phone Number: 05/24/2023, 4:49 PM  Clinical Narrative:                 Cm received consult for SNF Placement. Pt wanted cm to speak with wife tomorrow for SNF workup  Expected Discharge Plan: Skilled Nursing Facility Barriers to Discharge: Continued Medical Work up   Patient Goals and CMS Choice   CMS Medicare.gov Compare Post Acute Care list provided to:: Patient Choice offered to / list presented to : Patient      Expected Discharge Plan and Services     Post Acute Care Choice: Skilled Nursing Facility Living arrangements for the past 2 months: Single Family Home                                      Prior Living Arrangements/Services Living arrangements for the past 2 months: Single Family Home Lives with:: Self, Spouse          Need for Family Participation in Patient Care: Yes (Comment) Care giver support system in place?: Yes (comment)      Activities of Daily Living Home Assistive Devices/Equipment: Walker (specify type) (rollator walker) ADL Screening (condition at time of admission) Patient's cognitive ability adequate to safely complete daily activities?: Yes Is the patient deaf or have difficulty hearing?: No Does the patient have difficulty seeing, even when wearing glasses/contacts?: No Does the patient have difficulty concentrating, remembering, or making decisions?: No Patient able to express need for assistance with ADLs?: Yes Does the patient have difficulty dressing or bathing?: No Independently performs ADLs?: No Communication: Independent Dressing (OT): Needs assistance Is this a change from baseline?: Pre-admission baseline Grooming: Independent Feeding: Independent Bathing: Needs assistance Is this a change from baseline?: Pre-admission  baseline Toileting: Needs assistance Is this a change from baseline?: Pre-admission baseline In/Out Bed: Needs assistance Is this a change from baseline?: Pre-admission baseline Walks in Home: Needs assistance Is this a change from baseline?: Pre-admission baseline Does the patient have difficulty walking or climbing stairs?: Yes Weakness of Legs: Right Weakness of Arms/Hands: None  Permission Sought/Granted                  Emotional Assessment   Attitude/Demeanor/Rapport: Engaged Affect (typically observed): Calm Orientation: : Oriented to Self, Oriented to Place, Oriented to  Time, Oriented to Situation      Admission diagnosis:  Cellulitis and abscess of toe of right foot [L03.031, L02.611] Critical limb ischemia of right lower extremity (HCC) [I70.221] Patient Active Problem List   Diagnosis Date Noted   Critical limb ischemia of right lower extremity (HCC) 05/18/2023   Cellulitis 05/18/2023   Cellulitis and abscess of toe of right foot 05/18/2023   Atherosclerosis of native arteries of extremity with rest pain (HCC) 11/25/2019   PAD (peripheral artery disease) (HCC) 11/25/2019   Tobacco use disorder 11/11/2019   Atherosclerosis of native arteries of extremity with intermittent claudication (HCC) 11/11/2019   PCP:  Gildardo Pounds, PA Pharmacy:   [facility] [phone] - [place], [place] - [address] [place] [address] Phone: [phone] Fax: [phone]  [facility] [address] [place] [address] Phone: [phone] Fax:  [phone]  [facility] - [place], [place] - [address] [place] [address] Phone: [phone] Fax: [phone]     Social Determinants of Health (SDOH) Social History: SDOH Screenings   Food Insecurity: No Food Insecurity (05/18/2023)  Housing: Low Risk  (05/18/2023)  Transportation Needs: No Transportation Needs (05/18/2023)  Utilities: Not At  Risk (05/18/2023)  Tobacco Use: High Risk (05/23/2023)   SDOH Interventions:     Readmission Risk Interventions     No data to display

## 2023-05-24 NOTE — Evaluation (Signed)
Occupational Therapy Evaluation Patient Details Name: Brendan Williams MRN: [id number] DOB: [birth year] Today's Date: 05/24/2023   History of Present Illness Pt is a 62 year old male s/p right common femoral and superficial femoral artery endarterectomies and patch angioplasty, Redo surgery right femoral artery exposure, mechanical thrombectomy right anterior tibial and dorsalis pedis arteries, Atherectomy of the right SFA and popliteal arteries, angioplasty of the right anterior tibial artery, Stent placement x 3 to the right SFA and popliteal arteries, Separate and distinct right profunda femoris endarterectomy with separate patch angioplasty, Disposable/incisional VAC dressing placement right groin incision on 05/23/23; PMH significant for  severe PAD s/p stenting (Most recent April 2023), tobacco use disorder, neuropathy   Clinical Impression   Chart reviewed, nurse cleared pt for participation in OT evaluation. Pt is alert and oriented x4, agreeable to evaluation with encouragement. PTA pt is generally MOD I with ADL, assist for IADL from family (wife and adult grandson). Pt reports worsening mobility recently with family assisting with STS tranfers as needed, use of rollator household distances (shares with wife). Pt presents with deficits in endurance, activity tolerance, balance all affecting safe and optimal ADL completion. Pt will benefit from skilled OT to address functional deficits and to facilitate return to PLOF. OT will follow acutely.       If plan is discharge home, recommend the following: A little help with walking and/or transfers;A little help with bathing/dressing/bathroom;Assist for transportation;Help with stairs or ramp for entrance;Assistance with cooking/housework    Functional Status Assessment  Patient has had a recent decline in their functional status and demonstrates the ability to make significant improvements in function in a reasonable and predictable amount of time.   Equipment Recommendations  None recommended by OT    Recommendations for Other Services       Precautions / Restrictions Precautions Precautions: Fall Restrictions Weight Bearing Restrictions: No      Mobility Bed Mobility Overal bed mobility: Needs Assistance Bed Mobility: Supine to Sit     Supine to sit: Supervision, HOB elevated          Transfers Overall transfer level: Needs assistance Equipment used: Rolling walker (2 wheels) Transfers: Sit to/from Stand, Bed to chair/wheelchair/BSC Sit to Stand: Supervision     Step pivot transfers: Contact guard assist (with RW to bedside chair)     General transfer comment: intermittent vcs for safey/technique      Balance Overall balance assessment: Needs assistance Sitting-balance support: Feet supported Sitting balance-Leahy Scale: Good     Standing balance support: Bilateral upper extremity supported, During functional activity, Reliant on assistive device for balance Standing balance-Leahy Scale: Fair                             ADL either performed or assessed with clinical judgement   ADL Overall ADL's : Needs assistance/impaired Eating/Feeding: Set up;Sitting   Grooming: Wash/dry face;Oral care;Sitting;Set up               Lower Body Dressing: Minimal assistance Lower Body Dressing Details (indicate cue type and reason): socks long sitting in bed Toilet Transfer: Contact guard assist;Rolling walker (2 wheels) Toilet Transfer Details (indicate cue type and reason): simulated, step pivot; pt does not put weight on RLE despite encouragement         Functional mobility during ADLs: Contact guard assist;Rolling walker (2 wheels)       Vision Patient Visual Report: No change from baseline  Perception         Praxis         Pertinent Vitals/Pain Pain Assessment Pain Assessment: 0-10 Pain Score: 7  Pain Location: RLE Pain Descriptors / Indicators: Burning Pain  Intervention(s): Limited activity within patient's tolerance, Monitored during session, Repositioned, Patient requesting pain meds-RN notified     Extremity/Trunk Assessment Upper Extremity Assessment Upper Extremity Assessment: Overall WFL for tasks assessed   Lower Extremity Assessment Lower Extremity Assessment: LLE deficits/detail LLE Deficits / Details: limited due to pain       Communication Communication Communication: No apparent difficulties Cueing Techniques: Verbal cues;Tactile cues   Cognition Arousal: Alert Behavior During Therapy: WFL for tasks assessed/performed Overall Cognitive Status: Within Functional Limits for tasks assessed                                 General Comments: requires encouragement for participation in all tasks, but ultimately agreeable     General Comments  vss prior to mobility, monitored throughout and stable afterwards; blister on dorsal surface of R foot- vascular NP in room post session and notified    Exercises Other Exercises Other Exercises: edu re: role of OT, role of rehab, importance of progressive mobility while admitted   Shoulder Instructions      Home Living Family/patient expects to be discharged to:: Private residence Living Arrangements: Spouse/significant other;Children Available Help at Discharge: Family;Available PRN/intermittently Type of Home: Other(Comment) (single wide) Home Access: Stairs to enter Entrance Stairs-Number of Steps: 3 Entrance Stairs-Rails: Can reach both Home Layout: One level     Bathroom Shower/Tub: Walk-in shower (garden)         Home Equipment: Grab bars - tub/shower;Rollator (4 wheels);BSC/3in1;Cane - single point   Additional Comments: pt puts bsc in shower, has walking stick      Prior Functioning/Environment Prior Level of Function : Independent/Modified Independent;History of Falls (last six months)             Mobility Comments: amb with rollator  household distances (shares with wife), family will assist with STS from couch as needed per pt report ADLs Comments: generally MOD I with ADls, assist with IADLs        OT Problem List: Decreased activity tolerance;Impaired balance (sitting and/or standing);Decreased knowledge of use of DME or AE;Decreased knowledge of precautions      OT Treatment/Interventions: Self-care/ADL training;DME and/or AE instruction;Modalities;Therapeutic activities;Balance training;Therapeutic exercise;Energy conservation;Patient/family education    OT Goals(Current goals can be found in the care plan section) Acute Rehab OT Goals Patient Stated Goal: go home OT Goal Formulation: With patient Time For Goal Achievement: 06/07/23 Potential to Achieve Goals: Good ADL Goals Pt Will Perform Grooming: with modified independence;standing;sitting Pt Will Perform Lower Body Dressing: with modified independence Pt Will Transfer to Toilet: with modified independence;ambulating Pt Will Perform Toileting - Clothing Manipulation and hygiene: with modified independence;sit to/from stand  OT Frequency: Min 1X/week       AM-PAC OT "6 Clicks" Daily Activity     Outcome Measure Help from another person eating meals?: None Help from another person taking care of personal grooming?: None Help from another person toileting, which includes using toliet, bedpan, or urinal?: A Little Help from another person bathing (including washing, rinsing, drying)?: A Little Help from another person to put on and taking off regular upper body clothing?: A Little Help from another person to put on and taking off regular lower body clothing?: A  Little 6 Click Score: 20   End of Session Equipment Utilized During Treatment: Rolling walker (2 wheels) Nurse Communication: Mobility status;Patient requests pain meds  Activity Tolerance: Patient tolerated treatment well Patient left: in chair;with call bell/phone within reach  OT Visit  Diagnosis: Other abnormalities of gait and mobility (R26.89)                Time: 1610-9604 OT Time Calculation (min): 28 min Charges:  OT General Charges $OT Visit: 1 Visit OT Evaluation $OT Eval Moderate Complexity: 1 Mod  Oleta Mouse, OTD OTR/L  05/24/23, 10:18 AM

## 2023-05-24 NOTE — Anesthesia Postprocedure Evaluation (Addendum)
Anesthesia Post Note  Patient: Brendan Williams  Procedure(s) Performed: ENDARTERECTOMY FEMORAL REDO (Right) APPLICATION OF CELL SAVER (Right) ENDOVASCULAR REPAIR/STENT GRAFT (Right: Leg Upper) ARTHERECTOMY EACH ADDITIONAL VISCERAL ARTERY (Right) MECHANICAL THROMBECTOMY right popliteal artery  Patient location during evaluation: PACU Anesthesia Type: General Level of consciousness: awake and alert Pain management: pain level controlled Vital Signs Assessment: post-procedure vital signs reviewed and stable Respiratory status: spontaneous breathing, nonlabored ventilation, respiratory function stable and patient connected to nasal cannula oxygen Cardiovascular status: blood pressure returned to baseline and stable Postop Assessment: no apparent nausea or vomiting Anesthetic complications: no   No notable events documented.   Last Vitals:  Vitals:   05/24/23 0000 05/24/23 0100  BP: (!) 125/90   Pulse: 67 63  Resp: 13 19  Temp:    SpO2: 100% 100%    Last Pain:  Vitals:   05/24/23 0130  TempSrc:   PainSc: Asleep                 Foye Deer

## 2023-05-25 ENCOUNTER — Encounter: Payer: Self-pay | Admitting: Vascular Surgery

## 2023-05-25 ENCOUNTER — Other Ambulatory Visit (HOSPITAL_COMMUNITY): Payer: Self-pay

## 2023-05-25 DIAGNOSIS — I70221 Atherosclerosis of native arteries of extremities with rest pain, right leg: Secondary | ICD-10-CM | POA: Diagnosis not present

## 2023-05-25 LAB — HEPARIN LEVEL (UNFRACTIONATED): Heparin Unfractionated: 0.51 IU/mL (ref 0.30–0.70)

## 2023-05-25 MED ORDER — APIXABAN 5 MG PO TABS
5.0000 mg | ORAL_TABLET | Freq: Two times a day (BID) | ORAL | Status: DC
  Administered 2023-05-25 – 2023-05-29 (×9): 5 mg via ORAL
  Filled 2023-05-25 (×9): qty 1

## 2023-05-25 NOTE — Progress Notes (Signed)
PROGRESS NOTE    Brendan Williams  [name]:[id number] DOB: 04-04-1961 DOA: 05/18/2023 PCP: Gildardo Pounds, PA    Brief Narrative:     Brendan Williams is a 62 y.o. male with medical history significant of severe PAD s/p stenting (Most recent April 2023), tobacco use disorder, neuropathy, who presented to the ED due to right foot pain and redness.  He had been experiencing pain in the right foot since he recently saw his vascular surgeon.  However, pain in the right foot had progressively worsened about 3 to 4 days prior to admission.  Noticed purplish/redness discoloration on the dorsal aspect of the right foot and toes.  He also endorsed fever at home.     Workup revealed right foot cellulitis and critical right limb ischemia.  Status post angiography with vascular surgery on 8/5.    Status post femoral endarterectomy, lower extremity angiogram, mechanical thrombectomy, angioplasty with stent placement on 8/7.  Doing well postoperatively.  Seen in consultation by podiatry.  No amputation warranted at this time.   Assessment & Plan:   Principal Problem:   Critical limb ischemia of right lower extremity (HCC) Active Problems:   Tobacco use disorder   Cellulitis   Cellulitis and abscess of toe of right foot  PVD, critical limb ischemia of right lower extremity:  Unable to exclude overlying cellulitis Status post angiography with vascular surgery on 8/5 Status post right endarterectomies with angioplasty and stent placement 8/7 Plan: Transfer to floor.  Discussed case with vascular surgery.  Continue aspirin 81 mg daily.  Stop Plavix.  Stop heparin GTT.  Initiate Eliquis 5 mg twice daily.  Continue broad-spectrum IV antibiotics for now.  Continue Lipitor.  Follow further vascular surgery recommendations.   Right foot cellulitis:  Continue IV ceftriaxone and vancomycin.   No growth on blood cultures thus far. Continue broad-spectrum IV antibiotic therapy for 7 days.  Vancomycin and  ceftriaxone     Hypotension: Improved     Tobacco use disorder: Counseled to quit smoking cigarettes.    DVT prophylaxis: IV heparin Code Status: Full Family Communication: Spouse at bedside 8/6, 8/7 Disposition Plan: Status is: Inpatient Remains inpatient appropriate because: Critical limb ischemia.  Lower extremity cellulitis on IV antibiotics   Level of care: Med-Surg  Consultants:  Vascular surgery  Procedures:  Lower extremity angiogram 8/5  Antimicrobials: Vancomycin Ceftriaxone   Subjective: Seen.  Awake and alert.  No visible distress.  No pain complaints  Objective: Vitals:   05/25/23 0814 05/25/23 0859 05/25/23 0900 05/25/23 1056  BP:  99/70 99/70 103/66  Pulse: 78 (!) 51 (!) 51 (!) 58  Resp: 14 11 12 16   Temp:    98 F (36.7 C)  TempSrc:    Oral  SpO2: (!) 84% 97% 97% 100%  Weight:      Height:        Intake/Output Summary (Last 24 hours) at 05/25/2023 1128 Last data filed at 05/25/2023 1029 Gross per 24 hour  Intake 2781.48 ml  Output 1850 ml  Net 931.48 ml   Filed Weights   05/18/23 1251 05/23/23 1203 05/23/23 1955  Weight: 78.2 kg 78.2 kg 72.2 kg    Examination:  General exam: NAD Respiratory system: Lungs clear.  Normal work of breathing.  Room air Cardiovascular system: S1 and 2, RRR, no murmurs, no pedal edema Gastrointestinal system: Soft NT/ND, normal bowel sounds Central nervous system: Alert and oriented. No focal neurological deficits. Extremities: Symmetric 5 x 5 power. Skin: Purple discoloration  of right foot and toes.  Status post toe amputations.  Right foot tender to palpation, pulses improved Psychiatry: Judgement and insight appear normal. Mood & affect appropriate.     Data Reviewed: I have personally reviewed following labs and imaging studies  CBC: Recent Labs  Lab 05/21/23 0547 05/22/23 0307 05/24/23 0012 05/24/23 0352 05/25/23 0336  WBC 9.6 10.1 12.9* 14.3* 11.6*  HGB 12.7* 13.8 11.5* 11.2* 11.6*  HCT  37.7* 42.2 33.1* 32.4* 35.3*  MCV 92.9 94.2 90.7 90.3 95.4  PLT 379 379 328 325 324   Basic Metabolic Panel: Recent Labs  Lab 05/18/23 1253 05/19/23 0517 05/20/23 0411 05/23/23 0524 05/24/23 0012 05/25/23 0336  NA 131* 137  --  136 135  --   K 3.8 3.6  --  3.4* 3.9  --   CL 94* 105  --  106 106  --   CO2 27 23  --  23 17*  --   GLUCOSE 99 96  --  99 211*  --   BUN 8 6*  --  <5* 7*  --   CREATININE 0.83 0.65 0.77 0.68 0.75 0.82  CALCIUM 9.0 8.4*  --  8.5* 8.0*  --    GFR: Estimated Creatinine Clearance: 95.4 mL/min (by C-G formula based on SCr of 0.82 mg/dL). Liver Function Tests: Recent Labs  Lab 05/18/23 1253 05/19/23 0517  AST 16 13*  ALT 11 8  ALKPHOS 67 54  BILITOT 0.7 0.5  PROT 8.0 6.3*  ALBUMIN 3.7 2.9*   No results for input(s): "LIPASE", "AMYLASE" in the last 168 hours. No results for input(s): "AMMONIA" in the last 168 hours. Coagulation Profile: Recent Labs  Lab 05/18/23 1253  INR 1.1   Cardiac Enzymes: No results for input(s): "CKTOTAL", "CKMB", "CKMBINDEX", "TROPONINI" in the last 168 hours. BNP (last 3 results) No results for input(s): "PROBNP" in the last 8760 hours. HbA1C: No results for input(s): "HGBA1C" in the last 72 hours. CBG: Recent Labs  Lab 05/21/23 0455 05/23/23 1934 05/24/23 2048  GLUCAP 99 165* 101*   Lipid Profile: No results for input(s): "CHOL", "HDL", "LDLCALC", "TRIG", "CHOLHDL", "LDLDIRECT" in the last 72 hours. Thyroid Function Tests: No results for input(s): "TSH", "T4TOTAL", "FREET4", "T3FREE", "THYROIDAB" in the last 72 hours. Anemia Panel: No results for input(s): "VITAMINB12", "FOLATE", "FERRITIN", "TIBC", "IRON", "RETICCTPCT" in the last 72 hours. Sepsis Labs: No results for input(s): "PROCALCITON", "LATICACIDVEN" in the last 168 hours.  Recent Results (from the past 240 hour(s))  Culture, blood (single)     Status: None   Collection Time: 05/18/23  3:15 PM   Specimen: BLOOD LEFT ARM  Result Value Ref Range  Status   Specimen Description BLOOD LEFT ARM  Final   Special Requests   Final    BOTTLES DRAWN AEROBIC AND ANAEROBIC Blood Culture adequate volume   Culture   Final    NO GROWTH 5 DAYS Performed at [facility], [address], [place], [place] [address]    Report Status 05/23/2023 FINAL  Final         Radiology Studies: DG C-Arm 1-60 Min-No Report  Result Date: 05/23/2023 Fluoroscopy was utilized by the requesting physician.  No radiographic interpretation.   DG C-Arm 1-60 Min-No Report  Result Date: 05/23/2023 Fluoroscopy was utilized by the requesting physician.  No radiographic interpretation.        Scheduled Meds:  sodium chloride   Intravenous Once   aspirin EC  81 mg Oral Q0600   atorvastatin  40 mg Oral Daily   Chlorhexidine Gluconate Cloth  6 each Topical Daily   clopidogrel  75 mg Oral Daily   docusate sodium  100 mg Oral Daily   famotidine  20 mg Oral BID   gabapentin  300 mg Oral BID   sodium chloride flush  3 mL Intravenous Q12H   Continuous Infusions:  sodium chloride     cefTRIAXone (ROCEPHIN)  IV Stopped (05/24/23 1308)   heparin 1,900 Units/hr (05/25/23 0600)   magnesium sulfate bolus IVPB     vancomycin 1,250 mg (05/25/23 0352)     LOS: 7 days      Tresa Moore, MD [facility]   If 7PM-7AM, please contact night-coverage  05/25/2023, 11:28 AM

## 2023-05-25 NOTE — Progress Notes (Signed)
  Progress Note    05/25/2023 7:59 AM 2 Days Post-Op  Subjective:   Brendan Williams is a 62 yo male now POD#2 from right lower extremity common femoral and superficial endarterectomies with mechanical thrombectomy of the right anterior tibial and dorsalis pedis arteries.   On exam this morning patient is resting comfortably in bed.  Patient endorses his right lower extremity is warmer and feels much better.  Patient's toes appear warmer today, remain purple and mottled.  Patient has bounding pulses in his right lower extremity that are palpable.  Patient does endorse some pain mostly in his right groin.  No complaints overnight.  Vitals all remained stable   Vitals:   05/25/23 0500 05/25/23 0600  BP: 122/75 (!) 128/90  Pulse: (!) 49 66  Resp: 16   Temp:    SpO2: (!) 60% 100%   Physical Exam: Cardiac:  RRR, Normal S1, S2. No murmurs Lungs:  Scattered Rhonchi on auscultation. Normal respiratory effort.  Incisions:  Right Groin incision with Prevena Wound Vac in place. No hematoma to note. Left Groin from prior angio. With dressing Clean dry and intact. No hematoma to note.  Extremities:  Left lower extremity with palpable pulses and warm to touch. Right lower extremity with strong lower doppler pulses. Warm to touch from mid calf to mid foot.  Abdomen:  Positive bowel sounds throughout, soft, non tender and non distended.  Neurologic: AAOx3 follows commands and answers questions appropriately.   CBC    Component Value Date/Time   WBC 11.6 (H) 05/25/2023 0336   RBC 3.70 (L) 05/25/2023 0336   HGB 11.6 (L) 05/25/2023 0336   HCT 35.3 (L) 05/25/2023 0336   PLT 324 05/25/2023 0336   MCV 95.4 05/25/2023 0336   MCH 31.4 05/25/2023 0336   MCHC 32.9 05/25/2023 0336   RDW 13.4 05/25/2023 0336    BMET    Component Value Date/Time   NA 135 05/24/2023 0012   K 3.9 05/24/2023 0012   CL 106 05/24/2023 0012   CO2 17 (L) 05/24/2023 0012   GLUCOSE 211 (H) 05/24/2023 0012   BUN 7 (L)  05/24/2023 0012   CREATININE 0.82 05/25/2023 0336   CALCIUM 8.0 (L) 05/24/2023 0012   GFRNONAA >60 05/25/2023 0336   GFRAA >60 12/04/2019 0523    INR    Component Value Date/Time   INR 1.1 05/18/2023 1253     Intake/Output Summary (Last 24 hours) at 05/25/2023 0759 Last data filed at 05/25/2023 0600 Gross per 24 hour  Intake 2781.48 ml  Output 2200 ml  Net 581.48 ml     Assessment/Plan:  62 y.o. male is s/p  POD#2 from right lower extremity common femoral and superficial endarterectomies with mechanical thrombectomy of the right anterior tibial and dorsalis pedis arteries.  2 Days Post-Op   PLAN: Podiatry advises no intervention at this time but will follow. Greatly appreciated.   Heparin Infusion will be converted to ASA 81 mg daily and Eliquis 5 mg twice daily  Pain medication PRN PT/OT OOB with assistance. Ambulate in the halls QID Q 4hrs Pulse checks.  Advance diet as tolerated.    DVT prophylaxis:  Heparin Infusion   [name] [facility] 05/25/2023 7:59 AM

## 2023-05-25 NOTE — Progress Notes (Signed)
Occupational Therapy Treatment Patient Details Name: Brendan Williams MRN: [id number] DOB: 08/27/1961 Today's Date: 05/25/2023   History of present illness Pt is a 62 y.o. male presenting to hospital 05/18/23 with worsening pain and redness to dorsum of R foot.  Recent diagnosis 7/17 of critical limb ischemia R LE.  Pt admitted with critical limb ischemia R LE, cellulitis.  S/p 8/5 aortogram and selective R LE angiogram.  S/p 8/7 right lower extremity common femoral and superficial endarterectomies with mechanical thrombectomy of the right anterior tibial and dorsalis pedis arteries.  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   OT comments  Chart reviewed to date, pt greeted in bed, requiring encouragement for participatoin in OT tx session. Wife present throughout. Pt provided significant educaiton re: importance of continued mobility. Pt does not want to weight bear through RLE due to pain. At least MIN A required for amb in room with RW with pt hopping on LLE due to pain. SET UP required for LB dressing. Pt continues to benefit from skilled OT to address functional defiicts and to facilitate return to PLOF. OT will continue to follow acutely.       If plan is discharge home, recommend the following:  A little help with walking and/or transfers;A little help with bathing/dressing/bathroom;Assist for transportation;Help with stairs or ramp for entrance;Assistance with Charity fundraiser cushion (measurements OT);BSC/3in1 (if pt returns home and continues to refuse to WB through RLE would recommend mwc for safer mobility around the house)    Recommendations for Other Services      Precautions / Restrictions Precautions Precautions: Fall Restrictions Weight Bearing Restrictions: Yes RLE Weight Bearing: Weight bearing as tolerated       Mobility Bed Mobility Overal bed mobility:  Needs Assistance Bed Mobility: Supine to Sit, Sit to Supine     Supine to sit: HOB elevated, Modified independent (Device/Increase time), Used rails Sit to supine: HOB elevated, Modified independent (Device/Increase time), Used rails        Transfers Overall transfer level: Needs assistance Equipment used: Rolling walker (2 wheels) Transfers: Sit to/from Stand Sit to Stand: Min assist           General transfer comment: pt continues to refuse to weight bear through RLE due to pain     Balance Overall balance assessment: Needs assistance Sitting-balance support: No upper extremity supported, Feet supported Sitting balance-Leahy Scale: Good     Standing balance support: Bilateral upper extremity supported, During functional activity, Reliant on assistive device for balance Standing balance-Leahy Scale: Poor                             ADL either performed or assessed with clinical judgement   ADL Overall ADL's : Needs assistance/impaired Eating/Feeding: Set up;Sitting   Grooming: Wash/dry hands;Sitting;Set up               Lower Body Dressing: Set up;Sitting/lateral leans Lower Body Dressing Details (indicate cue type and reason): socks Toilet Transfer: Minimal assistance;Rolling walker (2 wheels);Ambulation Toilet Transfer Details (indicate cue type and reason): simulated, pt with incresed physical assist when transferring via amb instead of step or squat pivot         Functional mobility during ADLs: Minimal assistance;Rolling walker (2 wheels) (approx 20' in room wtih step by step multi modal cueing for safety) General ADL Comments: poor technique with  RW during functional mobiltiy requiring step by step multi modal cues for safety    Extremity/Trunk Assessment              Vision       Perception     Praxis      Cognition Arousal: Alert Behavior During Therapy: Anxious, Impulsive Overall Cognitive Status: Within Functional Limits  for tasks assessed Area of Impairment: Safety/judgement                         Safety/Judgement: Decreased awareness of deficits     General Comments: pt participation is also limited by reported pain, pt says he does not want to do therapy, just rest until he heals; educated on importance of progressing mobility        Exercises      Shoulder Instructions       General Comments vss thoughout; IV and wound vac intact pre/post session    Pertinent Vitals/ Pain       Pain Assessment Pain Assessment: 0-10 Pain Score: 10-Worst pain ever Pain Location: RLE Pain Descriptors / Indicators: Burning, Aching, Throbbing Pain Intervention(s): Limited activity within patient's tolerance, Monitored during session, Patient requesting pain meds-RN notified, Repositioned  Home Living                                          Prior Functioning/Environment              Frequency  Min 1X/week        Progress Toward Goals  OT Goals(current goals can now be found in the care plan section)  Progress towards OT goals: Progressing toward goals     Plan      Co-evaluation                 AM-PAC OT "6 Clicks" Daily Activity     Outcome Measure   Help from another person eating meals?: None Help from another person taking care of personal grooming?: None Help from another person toileting, which includes using toliet, bedpan, or urinal?: A Little Help from another person bathing (including washing, rinsing, drying)?: A Little Help from another person to put on and taking off regular upper body clothing?: A Little Help from another person to put on and taking off regular lower body clothing?: A Little 6 Click Score: 20    End of Session Equipment Utilized During Treatment: Rolling walker (2 wheels)  OT Visit Diagnosis: Other abnormalities of gait and mobility (R26.89)   Activity Tolerance Patient tolerated treatment well   Patient Left in  bed;with call bell/phone within reach;with bed alarm set   Nurse Communication Mobility status;Patient requests pain meds        Time: 1350-1415 OT Time Calculation (min): 25 min  Charges: OT General Charges $OT Visit: 1 Visit OT Treatments $Self Care/Home Management : 8-22 mins $Therapeutic Activity: 8-22 mins  Oleta Mouse, OTD OTR/L  05/25/23, 3:41 PM

## 2023-05-25 NOTE — Consult Note (Signed)
Pharmacy Consult Note  Pharmacy Consult for IV Heparin Indication: PAD with gangrenous changes.   PATIENT MEASUREMENTS: Height: 6\' 1"  (185.4 cm) Weight: 72.2 kg (159 lb 2.8 oz) IBW/kg (Calculated) : 79.9 HEPARIN DW (KG): 78.2  Recent Labs    05/25/23 0336 05/25/23 0754  HGB 11.6*  --   HCT 35.3*  --   PLT 324  --   HEPARINUNFRC  --  0.51  CREATININE 0.82  --     Estimated Creatinine Clearance: 95.4 mL/min (by C-G formula based on SCr of 0.82 mg/dL).  PAST MEDICAL HISTORY: Past Medical History:  Diagnosis Date   Peripheral arterial disease [facility])     ASSESSMENT: 62 y.o. male with PMH PAD s/p left toe amputation, ischemic pain, neuropathy is presenting with right foot pain and swelling, reportedly turning black, concerning for limb ischemia. Patient is not on chronic anticoagulation per chart review. Pharmacy has been consulted to initiate and manage heparin intravenous infusion. S/p lower extremity angiography and plan for endarterectomy 8/7.   Pertinent medications: ASA / Plavix No chronic anticoag PTA per chart review  Goal(s) of therapy: Heparin level 0.3 - 0.7 units/mL Monitor platelets by anticoagulation protocol: Yes   Baseline anticoagulation labs: Recent Labs    05/24/23 0012 05/24/23 0352 05/25/23 0336  HGB 11.5* 11.2* 11.6*  PLT 328 325 324    PLAN: --Heparin level is therapeutic x 1 --Continue heparin infusion at 1900 units/hr --Recheck HL in 6 hrs --Daily CBC per protocol while on IV heparin  Tressie Ellis 05/25/2023 8:24 AM

## 2023-05-25 NOTE — Consult Note (Signed)
Pharmacy Consult Note  Pharmacy Consult for IV Heparin Indication: PAD with gangrenous changes.   PATIENT MEASUREMENTS: Height: 6\' 1"  (185.4 cm) Weight: 72.2 kg (159 lb 2.8 oz) IBW/kg (Calculated) : 79.9 HEPARIN DW (KG): 78.2  Recent Labs    05/24/23 0012 05/24/23 0352 05/24/23 1011 05/25/23 0047  HGB 11.5* 11.2*  --   --   HCT 33.1* 32.4*  --   --   PLT 328 325  --   --   HEPARINUNFRC  --  0.42   < > 0.76*  CREATININE 0.75  --   --   --    < > = values in this interval not displayed.    Estimated Creatinine Clearance: 97.8 mL/min (by C-G formula based on SCr of 0.75 mg/dL).  PAST MEDICAL HISTORY: Past Medical History:  Diagnosis Date   Peripheral arterial disease [facility])     ASSESSMENT: 62 y.o. male with PMH PAD s/p left toe amputation, ischemic pain, neuropathy is presenting with right foot pain and swelling, reportedly turning black, concerning for limb ischemia. Patient is not on chronic anticoagulation per chart review. Pharmacy has been consulted to initiate and manage heparin intravenous infusion. S/p lower extremity angiography and plan for endarterectomy 8/7.   Pertinent medications: ASA / Plavix No chronic anticoag PTA per chart review  Goal(s) of therapy: Heparin level 0.3 - 0.7 units/mL Monitor platelets by anticoagulation protocol: Yes   Baseline anticoagulation labs: Recent Labs    05/22/23 0307 05/24/23 0012 05/24/23 0352  HGB 13.8 11.5* 11.2*  PLT 379 328 325      PLAN: 8/8@1829 : HL 0.60, therapeutic x 1 8/9 0047: HL 0.76, supratherapeutic  --Will decrease heparin infusion to 1900 units/hr --Recheck HL in 6 hrs after rate change --Daily CBC per protocol while on IV heparin  Otelia Sergeant, PharmD, MBA 05/25/2023 1:30 AM

## 2023-05-25 NOTE — TOC Benefit Eligibility Note (Signed)
Patient Product/process development scientist completed.    The patient is insured through [facility] MEDICAID.     Ran test claim for Eliquis 5 mg and the current 30 day co-pay is $4.00.   This test claim was processed through [facility]- copay amounts may vary at other pharmacies due to pharmacy/plan contracts, or as the patient moves through the different stages of their insurance plan.     Roland Earl, CPHT Pharmacy Patient Advocate Specialist [facility] Patient Advocate Team Direct Number: [phone]  Fax: [phone]

## 2023-05-25 NOTE — Plan of Care (Signed)
  Problem: Education: Goal: Knowledge of General Education information will improve Description: Including pain rating scale, medication(s)/side effects and non-pharmacologic comfort measures Outcome: Progressing   Problem: Health Behavior/Discharge Planning: Goal: Ability to manage health-related needs will improve Outcome: Progressing   Problem: Clinical Measurements: Goal: Ability to maintain clinical measurements within normal limits will improve Outcome: Progressing Goal: Will remain free from infection Outcome: Progressing Goal: Diagnostic test results will improve Outcome: Progressing Goal: Respiratory complications will improve Outcome: Progressing Goal: Cardiovascular complication will be avoided Outcome: Progressing   Problem: Activity: Goal: Risk for activity intolerance will decrease Outcome: Progressing   Problem: Nutrition: Goal: Adequate nutrition will be maintained Outcome: Progressing   Problem: Coping: Goal: Level of anxiety will decrease Outcome: Progressing   Problem: Elimination: Goal: Will not experience complications related to bowel motility Outcome: Progressing Goal: Will not experience complications related to urinary retention Outcome: Progressing   Problem: Pain Managment: Goal: General experience of comfort will improve Outcome: Progressing   Problem: Safety: Goal: Ability to remain free from injury will improve Outcome: Progressing   Problem: Skin Integrity: Goal: Risk for impaired skin integrity will decrease Outcome: Progressing   Problem: Education: Goal: Knowledge of prescribed regimen will improve Outcome: Progressing   Problem: Activity: Goal: Ability to tolerate increased activity will improve Outcome: Progressing   Problem: Bowel/Gastric: Goal: Gastrointestinal status for postoperative course will improve Outcome: Progressing   Problem: Clinical Measurements: Goal: Postoperative complications will be avoided or  minimized Outcome: Progressing Goal: Signs and symptoms of graft occlusion will improve Outcome: Progressing   Problem: Skin Integrity: Goal: Demonstration of wound healing without infection will improve Outcome: Progressing   

## 2023-05-25 NOTE — Progress Notes (Signed)
Physical Therapy Treatment Patient Details Name: Brendan Williams MRN: [id number] DOB: 1961-02-16 Today's Date: 05/25/2023   History of Present Illness Pt is a 62 y.o. male presenting to hospital 05/18/23 with worsening pain and redness to dorsum of R foot.  Recent diagnosis 7/17 of critical limb ischemia R LE.  Pt admitted with critical limb ischemia R LE, cellulitis.  S/p 8/5 aortogram and selective R LE angiogram.  S/p 8/7 right lower extremity common femoral and superficial endarterectomies with mechanical thrombectomy of the right anterior tibial and dorsalis pedis arteries.  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    PT Comments  Pt resting in bed upon PT arrival; pt's wife present.  Pt keeping R LE NWB'ing throughout session d/t R lower leg pain (7/10 pain; pt pre-medicated with pain meds).  During session pt modified independent with bed mobility; min assist with transfers; and min assist to ambulate 50 feet with RW use (2nd assist for safety/IV pole management).  Pt requiring consistent vc's to keep RW closer during ambulation for safety.  Will continue to focus on strengthening and progressive functional mobility per pt tolerance.    If plan is discharge home, recommend the following: A lot of help with walking and/or transfers;A little help with bathing/dressing/bathroom;Assistance with cooking/housework;Assist for transportation;Help with stairs or ramp for entrance   Can travel by private vehicle     Yes  Equipment Recommendations  Rolling walker (2 wheels);BSC/3in1;Wheelchair (measurements PT);Wheelchair cushion (measurements PT)    Recommendations for Other Services       Precautions / Restrictions Precautions Precautions: Fall Restrictions Weight Bearing Restrictions: Yes RLE Weight Bearing: Weight bearing as tolerated     Mobility  Bed Mobility Overal bed mobility: Needs Assistance Bed  Mobility: Supine to Sit, Sit to Supine     Supine to sit: HOB elevated, Modified independent (Device/Increase time), Used rails Sit to supine: HOB elevated, Modified independent (Device/Increase time), Used rails        Transfers Overall transfer level: Needs assistance Equipment used: Rolling walker (2 wheels) Transfers: Sit to/from Stand Sit to Stand: Min assist           General transfer comment: pt keeping R LE NWB'ing; vc's for UE placement    Ambulation/Gait Ambulation/Gait assistance: Min assist, +2 safety/equipment Gait Distance (Feet): 50 Feet Assistive device: Rolling walker (2 wheels)   Gait velocity: decreased     General Gait Details: vc's and assist to stay closer to RW; pt keeping R LE NWB'ing; vc's for safety and technique   Stairs             Wheelchair Mobility     Tilt Bed    Modified Rankin (Stroke Patients Only)       Balance Overall balance assessment: Needs assistance Sitting-balance support: No upper extremity supported, Feet supported Sitting balance-Leahy Scale: Good Sitting balance - Comments: steady reaching within BOS   Standing balance support: Bilateral upper extremity supported, During functional activity, Reliant on assistive device for balance Standing balance-Leahy Scale: Poor Standing balance comment: assist for balance with dynamic standing activities                            Cognition Arousal: Alert Behavior During Therapy: Anxious, Impulsive Overall Cognitive Status: Within Functional Limits for tasks assessed Area of Impairment: Safety/judgement  Safety/Judgement: Decreased awareness of deficits     General Comments: Limited activity d/t R LE pain        Exercises      General Comments General comments (skin integrity, edema, etc.): IV and wound vac intact beginning/end of session      Pertinent Vitals/Pain Pain Assessment Pain Assessment:  0-10 Pain Score: 7  Pain Location: RLE Pain Descriptors / Indicators: Burning, Aching, Throbbing Pain Intervention(s): Limited activity within patient's tolerance, Monitored during session, Premedicated before session, Repositioned Vitals (HR and SpO2 on room air) stable and WFL throughout treatment session.    Home Living                          Prior Function            PT Goals (current goals can now be found in the care plan section) Acute Rehab PT Goals Patient Stated Goal: to have less pain PT Goal Formulation: With patient Time For Goal Achievement: 06/07/23 Potential to Achieve Goals: Fair Progress towards PT goals: Progressing toward goals    Frequency    Min 1X/week      PT Plan      Co-evaluation              AM-PAC PT "6 Clicks" Mobility   Outcome Measure  Help needed turning from your back to your side while in a flat bed without using bedrails?: None Help needed moving from lying on your back to sitting on the side of a flat bed without using bedrails?: None Help needed moving to and from a bed to a chair (including a wheelchair)?: A Little Help needed standing up from a chair using your arms (e.g., wheelchair or bedside chair)?: A Little Help needed to walk in hospital room?: A Little Help needed climbing 3-5 steps with a railing? : Total 6 Click Score: 18    End of Session Equipment Utilized During Treatment: Gait belt Activity Tolerance: Patient limited by pain;Patient limited by fatigue Patient left: in bed;with call bell/phone within reach;with bed alarm set;with family/visitor present Nurse Communication: Mobility status;Precautions (via white board) PT Visit Diagnosis: Unsteadiness on feet (R26.81);Other abnormalities of gait and mobility (R26.89);Muscle weakness (generalized) (M62.81);Pain Pain - Right/Left: Right Pain - part of body: Leg     Time: 9629-5284 PT Time Calculation (min) (ACUTE ONLY): 23 min  Charges:     $Gait Training: 8-22 mins $Therapeutic Activity: 8-22 mins PT General Charges $$ ACUTE PT VISIT: 1 Visit                     Hendricks Limes, PT 05/25/23, 5:34 PM

## 2023-05-26 DIAGNOSIS — I70221 Atherosclerosis of native arteries of extremities with rest pain, right leg: Secondary | ICD-10-CM | POA: Diagnosis not present

## 2023-05-26 MED ORDER — POTASSIUM CHLORIDE CRYS ER 20 MEQ PO TBCR
40.0000 meq | EXTENDED_RELEASE_TABLET | Freq: Once | ORAL | Status: AC
  Administered 2023-05-26: 40 meq via ORAL
  Filled 2023-05-26: qty 2

## 2023-05-26 NOTE — Plan of Care (Signed)
  Problem: Education: Goal: Knowledge of General Education information will improve Description: Including pain rating scale, medication(s)/side effects and non-pharmacologic comfort measures Outcome: Progressing   Problem: Health Behavior/Discharge Planning: Goal: Ability to manage health-related needs will improve Outcome: Progressing   Problem: Clinical Measurements: Goal: Ability to maintain clinical measurements within normal limits will improve Outcome: Progressing Goal: Will remain free from infection Outcome: Progressing Goal: Diagnostic test results will improve Outcome: Progressing Goal: Respiratory complications will improve Outcome: Progressing Goal: Cardiovascular complication will be avoided Outcome: Progressing   Problem: Activity: Goal: Risk for activity intolerance will decrease Outcome: Progressing   Problem: Nutrition: Goal: Adequate nutrition will be maintained Outcome: Progressing   Problem: Coping: Goal: Level of anxiety will decrease Outcome: Progressing   Problem: Elimination: Goal: Will not experience complications related to bowel motility Outcome: Progressing Goal: Will not experience complications related to urinary retention Outcome: Progressing   Problem: Pain Managment: Goal: General experience of comfort will improve Outcome: Progressing   Problem: Safety: Goal: Ability to remain free from injury will improve Outcome: Progressing   Problem: Skin Integrity: Goal: Risk for impaired skin integrity will decrease Outcome: Progressing   Problem: Education: Goal: Knowledge of prescribed regimen will improve Outcome: Progressing   Problem: Activity: Goal: Ability to tolerate increased activity will improve Outcome: Progressing   Problem: Bowel/Gastric: Goal: Gastrointestinal status for postoperative course will improve Outcome: Progressing   Problem: Clinical Measurements: Goal: Postoperative complications will be avoided or  minimized Outcome: Progressing Goal: Signs and symptoms of graft occlusion will improve Outcome: Progressing   Problem: Skin Integrity: Goal: Demonstration of wound healing without infection will improve Outcome: Progressing   

## 2023-05-26 NOTE — Plan of Care (Signed)
  Problem: Clinical Measurements: Goal: Ability to maintain clinical measurements within normal limits will improve Outcome: Progressing Goal: Will remain free from infection Outcome: Progressing Goal: Diagnostic test results will improve Outcome: Progressing Goal: Respiratory complications will improve Outcome: Progressing Goal: Cardiovascular complication will be avoided Outcome: Progressing   Problem: Activity: Goal: Risk for activity intolerance will decrease Outcome: Progressing   Problem: Nutrition: Goal: Adequate nutrition will be maintained Outcome: Progressing   Problem: Coping: Goal: Level of anxiety will decrease Outcome: Progressing   Problem: Pain Managment: Goal: General experience of comfort will improve Outcome: Progressing   Problem: Safety: Goal: Ability to remain free from injury will improve Outcome: Progressing   Problem: Activity: Goal: Ability to tolerate increased activity will improve Outcome: Progressing   Problem: Bowel/Gastric: Goal: Gastrointestinal status for postoperative course will improve Outcome: Progressing   Problem: Skin Integrity: Goal: Demonstration of wound healing without infection will improve Outcome: Progressing

## 2023-05-26 NOTE — Progress Notes (Signed)
3 Days Post-Op   Subjective/Chief Complaint: States he feels much better. Pain in foot improved.   Objective: Vital signs in last 24 hours: Temp:  [98 F (36.7 C)-98.4 F (36.9 C)] 98.4 F (36.9 C) (08/10 0828) Pulse Rate:  [56-60] 56 (08/10 0828) Resp:  [16-18] 18 (08/10 0828) BP: (86-103)/(64-82) 88/71 (08/10 0828) SpO2:  [100 %] 100 % (08/10 0828) Last BM Date : 05/25/23  Intake/Output from previous day: 08/09 0701 - 08/10 0700 In: 413.2 [I.V.:3; IV Piggyback:410.2] Out: 1470 [Urine:1470] Intake/Output this shift: No intake/output data recorded.  General appearance: alert and no distress Extremities: Prevena in place- Right groin, LEFT groin access site- soft. Legs warm bilaterally, Right foot warm- erythema with gangrene of toes, +DP/PT   Lab Results:  Recent Labs    05/25/23 0336 05/26/23 0529  WBC 11.6* 9.7  HGB 11.6* 10.3*  HCT 35.3* 30.2*  PLT 324 311   BMET Recent Labs    05/24/23 0012 05/25/23 0336 05/26/23 0529  NA 135  --  139  K 3.9  --  3.2*  CL 106  --  109  CO2 17*  --  23  GLUCOSE 211*  --  102*  BUN 7*  --  5*  CREATININE 0.75 0.82 0.74  CALCIUM 8.0*  --  8.2*   PT/INR No results for input(s): "LABPROT", "INR" in the last 72 hours. ABG No results for input(s): "PHART", "HCO3" in the last 72 hours.  Invalid input(s): "PCO2", "PO2"  Studies/Results: No results found.  Anti-infectives: Anti-infectives (From admission, onward)    Start     Dose/Rate Route Frequency Ordered Stop   05/24/23 0000  ceFAZolin (ANCEF) IVPB 2g/100 mL premix        2 g 200 mL/hr over 30 Minutes Intravenous Every 8 hours 05/23/23 1950 05/24/23 0832   05/23/23 1138  ceFAZolin (ANCEF) IVPB 2g/100 mL premix        2 g 200 mL/hr over 30 Minutes Intravenous 30 min pre-op 05/23/23 1138 05/23/23 1609   05/21/23 0407  ceFAZolin (ANCEF) IVPB 2g/100 mL premix  Status:  Discontinued        2 g 200 mL/hr over 30 Minutes Intravenous 30 min pre-op 05/21/23 0407  05/21/23 0734   05/19/23 1200  cefTRIAXone (ROCEPHIN) 1 g in sodium chloride 0.9 % 100 mL IVPB        1 g 200 mL/hr over 30 Minutes Intravenous Every 24 hours 05/18/23 1459 05/27/23 2359   05/19/23 0400  vancomycin (VANCOREADY) IVPB 1250 mg/250 mL        1,250 mg 166.7 mL/hr over 90 Minutes Intravenous Every 12 hours 05/18/23 1542 05/27/23 2359   05/18/23 1500  vancomycin (VANCOREADY) IVPB 1750 mg/350 mL        1,750 mg 175 mL/hr over 120 Minutes Intravenous  Once 05/18/23 1414 05/18/23 1758   05/18/23 1400  vancomycin (VANCOCIN) IVPB 1000 mg/200 mL premix  Status:  Discontinued        1,000 mg 200 mL/hr over 60 Minutes Intravenous  Once 05/18/23 1349 05/18/23 1414   05/18/23 1400  cefTRIAXone (ROCEPHIN) 2 g in sodium chloride 0.9 % 100 mL IVPB        2 g 200 mL/hr over 30 Minutes Intravenous  Once 05/18/23 1349 05/18/23 1554       Assessment/Plan: s/p Procedure(s) with comments: ENDARTERECTOMY FEMORAL REDO (Right) APPLICATION OF CELL SAVER (Right) ENDOVASCULAR REPAIR/STENT GRAFT (Right) - Right SFA Stents ARTHERECTOMY EACH ADDITIONAL VISCERAL ARTERY (Right) - SFA  and  popliteal Artery MECHANICAL THROMBECTOMY right popliteal artery Continue supportive care. No further Vascular Surgery intervention recommended at this juncture ASA/Eliquis Allow foot/toes to demarcate and further recs per Podiatry OOB with PT/OT   LOS: 8 days    Eli Hose A 05/26/2023

## 2023-05-26 NOTE — Progress Notes (Signed)
PROGRESS NOTE    Brendan Williams  NGE:[id number] DOB: [birth year] DOA: 05/18/2023 PCP: Gildardo Pounds, PA    Brief Narrative:   Brendan Williams is a 62 y.o. male with medical history significant of severe PAD s/p stenting (Most recent April 2023), tobacco use disorder, neuropathy, who presented to the ED due to right foot pain and redness.  He had been experiencing pain in the right foot since he recently saw his vascular surgeon.  However, pain in the right foot had progressively worsened about 3 to 4 days prior to admission.  Noticed purplish/redness discoloration on the dorsal aspect of the right foot and toes.  He also endorsed fever at home.   Workup revealed right foot cellulitis and critical right limb ischemia.  Status post angiography with vascular surgery on 8/5.    Status post femoral endarterectomy, lower extremity angiogram, mechanical thrombectomy, angioplasty with stent placement on 8/7.  Doing well postoperatively.  Seen in consultation by podiatry.  No amputation warranted at this time.   Assessment & Plan:   Principal Problem:   Critical limb ischemia of right lower extremity (HCC) Active Problems:   Tobacco use disorder   Cellulitis   Cellulitis and abscess of toe of right foot  PVD, critical limb ischemia of right lower extremity:  Unable to exclude overlying cellulitis Status post angiography with vascular surgery on 8/5 Status post right endarterectomies with angioplasty and stent placement 8/7 Plan: Continue aspirin 81 mg daily Stop Plavix Eliquis 5 mg twice daily Continue broad-spectrum IV antibiotics.  10-day stop date in place.  Will discuss with vascular surgery and potentially ID regarding need for ongoing antibiotic therapy.  If no amputation is planned and may be challenging to achieve good source control on infection.  Right foot cellulitis:  Continue IV ceftriaxone and vancomycin.   No growth on blood cultures thus far. Continue broad-spectrum  IV antibiotic therapy for 10 days.   Vancomycin and ceftriaxone     Hypotension: Improved     Tobacco use disorder: Counseled to quit smoking cigarettes.    DVT prophylaxis: IV heparin Code Status: Full Family Communication: Spouse at bedside 8/6, 8/7, 8/10 Disposition Plan: Status is: Inpatient Remains inpatient appropriate because: Critical limb ischemia.  Lower extremity cellulitis on IV antibiotics   Level of care: Med-Surg  Consultants:  Vascular surgery  Procedures:  Lower extremity angiogram 8/5  Antimicrobials: Vancomycin Ceftriaxone   Subjective: Seen.  Awake and alert.  No visible distress.  Pain well-controlled  Objective: Vitals:   05/25/23 1056 05/25/23 1951 05/26/23 0439 05/26/23 0828  BP: 103/66 92/82 (!) 86/64 (!) 88/71  Pulse: (!) 58 (!) 59 60 (!) 56  Resp: 16 16 16 18   Temp: 98 F (36.7 C) 98.3 F (36.8 C) 98.4 F (36.9 C) 98.4 F (36.9 C)  TempSrc: Oral Oral Oral Oral  SpO2: 100% 100% 100% 100%  Weight:      Height:        Intake/Output Summary (Last 24 hours) at 05/26/2023 1037 Last data filed at 05/26/2023 0100 Gross per 24 hour  Intake 413.22 ml  Output 1020 ml  Net -606.78 ml   Filed Weights   05/18/23 1251 05/23/23 1203 05/23/23 1955  Weight: 78.2 kg 78.2 kg 72.2 kg    Examination:  General exam: No acute distress Respiratory system: Lungs clear.  Normal work of breathing.  Room air Cardiovascular system: S1 and 2, RRR, no murmurs, no pedal edema Gastrointestinal system: Soft, nontender, nondistended, + bowel sounds Central  nervous system: Alert and oriented. No focal neurological deficits. Extremities: Symmetric 5 x 5 power. Skin: Purple discoloration of right foot and toes.  Status post toe amputations.  Right foot tender to palpation, pulses improved Psychiatry: Judgement and insight appear normal. Mood & affect appropriate.     Data Reviewed: I have personally reviewed following labs and imaging  studies  CBC: Recent Labs  Lab 05/22/23 0307 05/24/23 0012 05/24/23 0352 05/25/23 0336 05/26/23 0529  WBC 10.1 12.9* 14.3* 11.6* 9.7  HGB 13.8 11.5* 11.2* 11.6* 10.3*  HCT 42.2 33.1* 32.4* 35.3* 30.2*  MCV 94.2 90.7 90.3 95.4 92.9  PLT 379 328 325 324 311   Basic Metabolic Panel: Recent Labs  Lab 05/20/23 0411 05/23/23 0524 05/24/23 0012 05/25/23 0336 05/26/23 0529  NA  --  136 135  --  139  K  --  3.4* 3.9  --  3.2*  CL  --  106 106  --  109  CO2  --  23 17*  --  23  GLUCOSE  --  99 211*  --  102*  BUN  --  <5* 7*  --  5*  CREATININE 0.77 0.68 0.75 0.82 0.74  CALCIUM  --  8.5* 8.0*  --  8.2*   GFR: Estimated Creatinine Clearance: 97.8 mL/min (by C-G formula based on SCr of 0.74 mg/dL). Liver Function Tests: No results for input(s): "AST", "ALT", "ALKPHOS", "BILITOT", "PROT", "ALBUMIN" in the last 168 hours.  No results for input(s): "LIPASE", "AMYLASE" in the last 168 hours. No results for input(s): "AMMONIA" in the last 168 hours. Coagulation Profile: No results for input(s): "INR", "PROTIME" in the last 168 hours.  Cardiac Enzymes: No results for input(s): "CKTOTAL", "CKMB", "CKMBINDEX", "TROPONINI" in the last 168 hours. BNP (last 3 results) No results for input(s): "PROBNP" in the last 8760 hours. HbA1C: No results for input(s): "HGBA1C" in the last 72 hours. CBG: Recent Labs  Lab 05/21/23 0455 05/23/23 1934 05/24/23 2048  GLUCAP 99 165* 101*   Lipid Profile: No results for input(s): "CHOL", "HDL", "LDLCALC", "TRIG", "CHOLHDL", "LDLDIRECT" in the last 72 hours. Thyroid Function Tests: No results for input(s): "TSH", "T4TOTAL", "FREET4", "T3FREE", "THYROIDAB" in the last 72 hours. Anemia Panel: No results for input(s): "VITAMINB12", "FOLATE", "FERRITIN", "TIBC", "IRON", "RETICCTPCT" in the last 72 hours. Sepsis Labs: No results for input(s): "PROCALCITON", "LATICACIDVEN" in the last 168 hours.  Recent Results (from the past 240 hour(s))  Culture,  blood (single)     Status: None   Collection Time: 05/18/23  3:15 PM   Specimen: BLOOD LEFT ARM  Result Value Ref Range Status   Specimen Description BLOOD LEFT ARM  Final   Special Requests   Final    BOTTLES DRAWN AEROBIC AND ANAEROBIC Blood Culture adequate volume   Culture   Final    NO GROWTH 5 DAYS Performed at [facility], [address] [place], [place] [address]    Report Status 05/23/2023 FINAL  Final         Radiology Studies: No results found.      Scheduled Meds:  sodium chloride   Intravenous Once   apixaban  5 mg Oral BID   aspirin EC  81 mg Oral Q0600   atorvastatin  40 mg Oral Daily   Chlorhexidine Gluconate Cloth  6 each Topical Daily   docusate sodium  100 mg Oral Daily   famotidine  20 mg Oral BID   gabapentin  300 mg Oral BID   sodium  chloride flush  3 mL Intravenous Q12H   Continuous Infusions:  sodium chloride     cefTRIAXone (ROCEPHIN)  IV Stopped (05/25/23 1254)   magnesium sulfate bolus IVPB     vancomycin 1,250 mg (05/26/23 0428)     LOS: 8 days      Tresa Moore, MD [facility]   If 7PM-7AM, please contact night-coverage  05/26/2023, 10:37 AM

## 2023-05-27 DIAGNOSIS — I70221 Atherosclerosis of native arteries of extremities with rest pain, right leg: Secondary | ICD-10-CM | POA: Diagnosis not present

## 2023-05-27 NOTE — Progress Notes (Signed)
PROGRESS NOTE    Brendan Williams  UXL:[id number] DOB: 06/27/1961 DOA: 05/18/2023 PCP: Gildardo Pounds, PA    Brief Narrative:   Brendan Williams is a 62 y.o. male with medical history significant of severe PAD s/p stenting (Most recent April 2023), tobacco use disorder, neuropathy, who presented to the ED due to right foot pain and redness.  He had been experiencing pain in the right foot since he recently saw his vascular surgeon.  However, pain in the right foot had progressively worsened about 3 to 4 days prior to admission.  Noticed purplish/redness discoloration on the dorsal aspect of the right foot and toes.  He also endorsed fever at home.   Workup revealed right foot cellulitis and critical right limb ischemia.  Status post angiography with vascular surgery on 8/5.    Status post femoral endarterectomy, lower extremity angiogram, mechanical thrombectomy, angioplasty with stent placement on 8/7.  Doing well postoperatively.  Seen in consultation by podiatry.  No amputation warranted at this time.   Assessment & Plan:   Principal Problem:   Critical limb ischemia of right lower extremity (HCC) Active Problems:   Tobacco use disorder   Cellulitis   Cellulitis and abscess of toe of right foot  PVD, critical limb ischemia of right lower extremity:  Unable to exclude overlying cellulitis Status post angiography with vascular surgery on 8/5 Status post right endarterectomies with angioplasty and stent placement 8/7 Plan: Continue aspirin 81 mg daily Continue Eliquis 5 mg twice daily Continue broad-spectrum IV antibiotics.  10-day stop date in place.  Will discuss with vascular surgery and potentially ID regarding need for ongoing antibiotic therapy.  If no amputation is planned and may be challenging to achieve good source control on infection.  Right foot cellulitis:  Continue IV ceftriaxone and vancomycin.   No growth on blood cultures thus far. 10-day stop date in place    Hypotension: Improved   Tobacco use disorder: Counseled to quit smoking cigarettes.    DVT prophylaxis: IV heparin Code Status: Full Family Communication: Spouse at bedside 8/6, 8/7, 8/10, 8/11 Disposition Plan: Status is: Inpatient Remains inpatient appropriate because: Critical limb ischemia.  Lower extremity cellulitis on IV antibiotics   Level of care: Med-Surg  Consultants:  Vascular surgery  Procedures:  Lower extremity angiogram 8/5  Antimicrobials: Vancomycin Ceftriaxone   Subjective: Seen and examined.  Awake alert.  No visible distress.  Pain well-controlled  Objective: Vitals:   05/26/23 1912 05/27/23 0402 05/27/23 0403 05/27/23 0750  BP: 99/67 (!) 89/64 94/61 95/73   Pulse: (!) 56 (!) 58 (!) 57 64  Resp: 16 20    Temp: 98.6 F (37 C) 98.2 F (36.8 C)  98.5 F (36.9 C)  TempSrc: Oral Oral  Oral  SpO2: 98% 100%  100%  Weight:      Height:        Intake/Output Summary (Last 24 hours) at 05/27/2023 1043 Last data filed at 05/27/2023 0538 Gross per 24 hour  Intake 120 ml  Output 900 ml  Net -780 ml   Filed Weights   05/18/23 1251 05/23/23 1203 05/23/23 1955  Weight: 78.2 kg 78.2 kg 72.2 kg    Examination:  General exam: NAD Respiratory system: Lungs clear.  Normal work of breathing.  Room air Cardiovascular system: S1, S2, RRR, no murmurs, no pedal edema Gastrointestinal system: Soft, nontender, nondistended, + bowel sounds Central nervous system: Alert and oriented. No focal neurological deficits. Extremities: Symmetric 5 x 5 power. Skin: Purple discoloration  of right foot and toes.  Status post toe amputations.  Right foot tender to palpation, pulses improved Psychiatry: Judgement and insight appear normal. Mood & affect appropriate.     Data Reviewed: I have personally reviewed following labs and imaging studies  CBC: Recent Labs  Lab 05/22/23 0307 05/24/23 0012 05/24/23 0352 05/25/23 0336 05/26/23 0529  WBC 10.1 12.9* 14.3*  11.6* 9.7  HGB 13.8 11.5* 11.2* 11.6* 10.3*  HCT 42.2 33.1* 32.4* 35.3* 30.2*  MCV 94.2 90.7 90.3 95.4 92.9  PLT 379 328 325 324 311   Basic Metabolic Panel: Recent Labs  Lab 05/23/23 0524 05/24/23 0012 05/25/23 0336 05/26/23 0529  NA 136 135  --  139  K 3.4* 3.9  --  3.2*  CL 106 106  --  109  CO2 23 17*  --  23  GLUCOSE 99 211*  --  102*  BUN <5* 7*  --  5*  CREATININE 0.68 0.75 0.82 0.74  CALCIUM 8.5* 8.0*  --  8.2*   GFR: Estimated Creatinine Clearance: 97.8 mL/min (by C-G formula based on SCr of 0.74 mg/dL). Liver Function Tests: No results for input(s): "AST", "ALT", "ALKPHOS", "BILITOT", "PROT", "ALBUMIN" in the last 168 hours.  No results for input(s): "LIPASE", "AMYLASE" in the last 168 hours. No results for input(s): "AMMONIA" in the last 168 hours. Coagulation Profile: No results for input(s): "INR", "PROTIME" in the last 168 hours.  Cardiac Enzymes: No results for input(s): "CKTOTAL", "CKMB", "CKMBINDEX", "TROPONINI" in the last 168 hours. BNP (last 3 results) No results for input(s): "PROBNP" in the last 8760 hours. HbA1C: No results for input(s): "HGBA1C" in the last 72 hours. CBG: Recent Labs  Lab 05/21/23 0455 05/23/23 1934 05/24/23 2048  GLUCAP 99 165* 101*   Lipid Profile: No results for input(s): "CHOL", "HDL", "LDLCALC", "TRIG", "CHOLHDL", "LDLDIRECT" in the last 72 hours. Thyroid Function Tests: No results for input(s): "TSH", "T4TOTAL", "FREET4", "T3FREE", "THYROIDAB" in the last 72 hours. Anemia Panel: No results for input(s): "VITAMINB12", "FOLATE", "FERRITIN", "TIBC", "IRON", "RETICCTPCT" in the last 72 hours. Sepsis Labs: No results for input(s): "PROCALCITON", "LATICACIDVEN" in the last 168 hours.  Recent Results (from the past 240 hour(s))  Culture, blood (single)     Status: None   Collection Time: 05/18/23  3:15 PM   Specimen: BLOOD LEFT ARM  Result Value Ref Range Status   Specimen Description BLOOD LEFT ARM  Final   Special  Requests   Final    BOTTLES DRAWN AEROBIC AND ANAEROBIC Blood Culture adequate volume   Culture   Final    NO GROWTH 5 DAYS Performed at [facility], [address] [place], [place] [address]    Report Status 05/23/2023 FINAL  Final         Radiology Studies: No results found.      Scheduled Meds:  sodium chloride   Intravenous Once   apixaban  5 mg Oral BID   aspirin EC  81 mg Oral Q0600   atorvastatin  40 mg Oral Daily   Chlorhexidine Gluconate Cloth  6 each Topical Daily   docusate sodium  100 mg Oral Daily   famotidine  20 mg Oral BID   gabapentin  300 mg Oral BID   sodium chloride flush  3 mL Intravenous Q12H   Continuous Infusions:  sodium chloride     cefTRIAXone (ROCEPHIN)  IV 1 g (05/26/23 1216)   magnesium sulfate bolus IVPB     vancomycin 1,250 mg (05/27/23 0503)  LOS: 9 days      Tresa Moore, MD [facility]   If 7PM-7AM, please contact night-coverage  05/27/2023, 10:43 AM

## 2023-05-27 NOTE — TOC Progression Note (Signed)
Transition of Care [facility]) - Progression Note    Patient Details  Name: Brendan Williams MRN: [id number] Date of Birth: 26-Jun-1961  Transition of Care [facility]) CM/SW Contact  Kemper Durie, RN Phone Number: 05/27/2023, 3:41 PM  Clinical Narrative:     Met with patient at bedside to discuss recommendation for SNF for short term rehab.  He declines but state he will leave decision to his wife.  State he was told that he may go home with IV antibiotics, will know for sure tomorrow.  Wife was not at bedside, attempted to call twice today to discuss discharge planning, voice message left both times.   Expected Discharge Plan: Skilled Nursing Facility Barriers to Discharge: Continued Medical Work up  Expected Discharge Plan and Services     Post Acute Care Choice: Skilled Nursing Facility Living arrangements for the past 2 months: Single Family Home                                       Social Determinants of Health (SDOH) Interventions SDOH Screenings   Food Insecurity: No Food Insecurity (05/18/2023)  Housing: Low Risk  (05/18/2023)  Transportation Needs: No Transportation Needs (05/18/2023)  Utilities: Not At Risk (05/18/2023)  Tobacco Use: High Risk (05/23/2023)    Readmission Risk Interventions     No data to display

## 2023-05-27 NOTE — Progress Notes (Signed)
Physical Therapy Treatment Patient Details Name: Brendan Williams MRN: [id number] DOB: 1961/06/03 Today's Date: 05/27/2023   History of Present Illness Pt is a 62 y.o. male presenting to hospital 05/18/23 with worsening pain and redness to dorsum of R foot.  Recent diagnosis 7/17 of critical limb ischemia R LE.  Pt admitted with critical limb ischemia R LE, cellulitis.  S/p 8/5 aortogram and selective R LE angiogram.  S/p 8/7 right lower extremity common femoral and superficial endarterectomies with mechanical thrombectomy of the right anterior tibial and dorsalis pedis arteries.  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    PT Comments  Pt in bed, ready for session.  Generally impulsive during session.  Pt voicing concern over IV being pulled so it is reinforced with tape but them pt quickly lays down reaching with arm and tugging on line.  He is able to get to to/from sitting without assist but cues for IV line continue.  He is able to stand with contact guard to RW and progresses gait to B pod nursing desk and back.  Despite cues and education to stay up inside of walker, he keeps walker too far forward and only advances LLE to back legs instead of up into walker box.  Several attempts to educate for proper safety but he does not correct.  He maintains NWB RLE during gait with leg excessively in a flexed hip/knee position which does not help his gait quality.  Returns to supine after session and further education/demo of proper gait is again explained.  Pt remains at elevated fall risk due to impulsivity, poor gait mechanics and general resistance to education.   If plan is discharge home, recommend the following: A lot of help with walking and/or transfers;A little help with bathing/dressing/bathroom;Assistance with cooking/housework;Assist for transportation;Help with stairs or ramp for entrance   Can travel by private vehicle      Yes  Equipment Recommendations  Rolling walker (2 wheels);BSC/3in1;Wheelchair (measurements PT);Wheelchair cushion (measurements PT)    Recommendations for Other Services       Precautions / Restrictions Precautions Precautions: Fall Restrictions Weight Bearing Restrictions: Yes RLE Weight Bearing: Weight bearing as tolerated     Mobility  Bed Mobility Overal bed mobility: Modified Independent Bed Mobility: Supine to Sit, Sit to Supine     Supine to sit: HOB elevated, Modified independent (Device/Increase time), Used rails Sit to supine: HOB elevated, Modified independent (Device/Increase time), Used rails   General bed mobility comments: SBA for lines Patient Response: Impulsive  Transfers Overall transfer level: Needs assistance Equipment used: Rolling walker (2 wheels) Transfers: Sit to/from Stand Sit to Stand: Contact guard assist, Min assist                Ambulation/Gait Ambulation/Gait assistance: Min assist, Contact guard assist Gait Distance (Feet): 60 Feet Assistive device: Rolling walker (2 wheels) Gait Pattern/deviations: Step-to pattern Gait velocity: decreased     General Gait Details: vc's and assist to stay closer to RW; pt keeping R LE NWB'ing; vc's for safety and technique   Stairs             Wheelchair Mobility     Tilt Bed Tilt Bed Patient Response: Impulsive  Modified Rankin (Stroke Patients Only)       Balance Overall balance assessment: Needs assistance Sitting-balance support: No upper extremity supported, Feet supported Sitting balance-Leahy Scale: Good     Standing balance support: Bilateral upper extremity  supported, During functional activity, Reliant on assistive device for balance Standing balance-Leahy Scale: Poor Standing balance comment: assist for balance with dynamic standing activities                            Cognition Arousal: Alert Behavior During Therapy: Impulsive Overall  Cognitive Status: Within Functional Limits for tasks assessed Area of Impairment: Safety/judgement                         Safety/Judgement: Decreased awareness of deficits     General Comments: Limited activity d/t R LE pain        Exercises      General Comments        Pertinent Vitals/Pain Pain Assessment Pain Assessment: Faces Faces Pain Scale: Hurts even more Pain Location: RLE Pain Descriptors / Indicators: Burning, Aching, Throbbing Pain Intervention(s): Limited activity within patient's tolerance, Monitored during session, Repositioned, Premedicated before session    Home Living                          Prior Function            PT Goals (current goals can now be found in the care plan section) Progress towards PT goals: Progressing toward goals    Frequency    Min 1X/week      PT Plan      Co-evaluation              AM-PAC PT "6 Clicks" Mobility   Outcome Measure  Help needed turning from your back to your side while in a flat bed without using bedrails?: None Help needed moving from lying on your back to sitting on the side of a flat bed without using bedrails?: None Help needed moving to and from a bed to a chair (including a wheelchair)?: A Little Help needed standing up from a chair using your arms (e.g., wheelchair or bedside chair)?: A Little Help needed to walk in hospital room?: A Little Help needed climbing 3-5 steps with a railing? : Total 6 Click Score: 18    End of Session Equipment Utilized During Treatment: Gait belt Activity Tolerance: Patient limited by pain;Patient limited by fatigue Patient left: in bed;with call bell/phone within reach;with bed alarm set;with family/visitor present Nurse Communication: Mobility status;Precautions (via white board) PT Visit Diagnosis: Unsteadiness on feet (R26.81);Other abnormalities of gait and mobility (R26.89);Muscle weakness (generalized) (M62.81);Pain Pain -  Right/Left: Right Pain - part of body: Leg     Time: 6578-4696 PT Time Calculation (min) (ACUTE ONLY): 13 min  Charges:    $Gait Training: 8-22 mins PT General Charges $$ ACUTE PT VISIT: 1 Visit                   Danielle Dess, PTA 05/27/23, 10:49 AM

## 2023-05-28 DIAGNOSIS — F17210 Nicotine dependence, cigarettes, uncomplicated: Secondary | ICD-10-CM

## 2023-05-28 DIAGNOSIS — I96 Gangrene, not elsewhere classified: Secondary | ICD-10-CM

## 2023-05-28 DIAGNOSIS — I70221 Atherosclerosis of native arteries of extremities with rest pain, right leg: Secondary | ICD-10-CM | POA: Diagnosis not present

## 2023-05-28 NOTE — Progress Notes (Signed)
Occupational Therapy Treatment Patient Details Name: Brendan Williams MRN: [id number] DOB: 12-Oct-1961 Today's Date: 05/28/2023   History of present illness Pt is a 62 y.o. male presenting to hospital 05/18/23 with worsening pain and redness to dorsum of R foot.  Recent diagnosis 7/17 of critical limb ischemia R LE.  Pt admitted with critical limb ischemia R LE, cellulitis.  S/p 8/5 aortogram and selective R LE angiogram.  S/p 8/7 right lower extremity common femoral and superficial endarterectomies with mechanical thrombectomy of the right anterior tibial and dorsalis pedis arteries.  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   OT comments  Pt received supine in bed. Appearing alert; willing to work with OT on functional mobility. T/f independently to EOB; CGA mobility in hallway with RW; maintaining NWB RLE. See flowsheet below for further details of session. Left supine in bed with all needs in reach, wife at bedside, bed alarm on. Patient will benefit from continued OT while in acute care.       If plan is discharge home, recommend the following:  A little help with walking and/or transfers;A little help with bathing/dressing/bathroom;Assist for transportation;Help with stairs or ramp for entrance;Assistance with cooking/housework   Equipment Recommendations  BSC/3in1;Other (comment);Wheelchair cushion (measurements OT) (if pt refuses SNF and goes home, would recommend [facility]wc)    Recommendations for Other Services      Precautions / Restrictions Precautions Precautions: Fall Restrictions Weight Bearing Restrictions: Yes RLE Weight Bearing: Non weight bearing       Mobility Bed Mobility Overal bed mobility: Modified Independent             General bed mobility comments: No assist needed today    Transfers Overall transfer level: Needs assistance Equipment used: Rolling walker (2 wheels) Transfers:  Sit to/from Stand Sit to Stand: Contact guard assist           General transfer comment: gait belt for safety     Balance Overall balance assessment: Mild deficits observed, not formally tested Sitting-balance support: No upper extremity supported, Feet supported Sitting balance-Leahy Scale: Good     Standing balance support: Bilateral upper extremity supported, During functional activity, Reliant on assistive device for balance Standing balance-Leahy Scale: Fair                             ADL either performed or assessed with clinical judgement   ADL Overall ADL's : Needs assistance/impaired                     Lower Body Dressing: Set up;Sitting/lateral leans Lower Body Dressing Details (indicate cue type and reason): pt able to don R sock; OT assisted pt to cut R sock down the side about 2 inches to keep pressure off of leg               General ADL Comments: pt declining ADLs today; agreeable to functional mobility    Extremity/Trunk Assessment Upper Extremity Assessment Upper Extremity Assessment: Overall WFL for tasks assessed   Lower Extremity Assessment Lower Extremity Assessment: Defer to PT evaluation (pt holding R foot NWB throughout session)        Vision       Perception     Praxis      Cognition Arousal: Alert Behavior During Therapy: Impulsive Overall Cognitive Status: Within Functional Limits for tasks assessed  Exercises      Shoulder Instructions       General Comments Pt agreeable to walk approx 30 feet into hallway and 30 feet back with RW; pt holding RW far in front and taking large hop with L foot (keeping R foot NWB); OT cued pt to keep RW closer to body; pt following cues for a few steps, and then going back to large steps. No overt loss of balance during mobility.    Pertinent Vitals/ Pain       Pain Assessment Pain Assessment: 0-10 Pain Score:   ("it's better"; does not rate) Pain Location: RLE Pain Descriptors / Indicators: Burning, Aching, Throbbing Pain Intervention(s): Limited activity within patient's tolerance, Monitored during session  Home Living                                          Prior Functioning/Environment              Frequency  Min 1X/week        Progress Toward Goals  OT Goals(current goals can now be found in the care plan section)  Progress towards OT goals: Progressing toward goals  Acute Rehab OT Goals Patient Stated Goal: go home OT Goal Formulation: With patient Time For Goal Achievement: 06/07/23 Potential to Achieve Goals: Good ADL Goals Pt Will Perform Grooming: with modified independence;standing;sitting Pt Will Perform Lower Body Dressing: with modified independence Pt Will Transfer to Toilet: with modified independence;ambulating Pt Will Perform Toileting - Clothing Manipulation and hygiene: with modified independence;sit to/from stand  Plan      Co-evaluation                 AM-PAC OT "6 Clicks" Daily Activity     Outcome Measure   Help from another person eating meals?: None Help from another person taking care of personal grooming?: None Help from another person toileting, which includes using toliet, bedpan, or urinal?: A Little Help from another person bathing (including washing, rinsing, drying)?: A Little Help from another person to put on and taking off regular upper body clothing?: None Help from another person to put on and taking off regular lower body clothing?: A Little 6 Click Score: 21    End of Session Equipment Utilized During Treatment: Rolling walker (2 wheels)  OT Visit Diagnosis: Other abnormalities of gait and mobility (R26.89)   Activity Tolerance Patient tolerated treatment well   Patient Left in bed;with call bell/phone within reach;with bed alarm set;with family/visitor present   Nurse Communication Mobility  status        Time: 4782-9562 OT Time Calculation (min): 16 min  Charges: OT General Charges $OT Visit: 1 Visit OT Treatments $Therapeutic Activity: 8-22 mins  Linward Foster, MS, OTR/L   Alvester Morin 05/28/2023, 4:04 PM

## 2023-05-28 NOTE — Progress Notes (Signed)
Physical Therapy Treatment Patient Details Name: Brendan Williams MRN: [id number] DOB: [birth year] Today's Date: 05/28/2023   History of Present Illness Pt is a 62 y.o. male presenting to hospital 05/18/23 with worsening pain and redness to dorsum of R foot.  Recent diagnosis 7/17 of critical limb ischemia R LE.  Pt admitted with critical limb ischemia R LE, cellulitis.  S/p 8/5 aortogram and selective R LE angiogram.  S/p 8/7 right lower extremity common femoral and superficial endarterectomies with mechanical thrombectomy of the right anterior tibial and dorsalis pedis arteries.  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    PT Comments  Pt ready.  Bed mobility without assist.  Stands with contact guard and is able to walk 100' with RW and contact guard +2 for general safety given impulsivity and poor gait quality.  He does have improved gait mechanics today stepping further up into walker but upon return to room does not turn fully and falls into bed (by choice).  Pt continues with NWB RLE.   If plan is discharge home, recommend the following: A little help with bathing/dressing/bathroom;Assistance with cooking/housework;Assist for transportation;Help with stairs or ramp for entrance;A little help with walking and/or transfers   Can travel by private vehicle        Equipment Recommendations  Rolling walker (2 wheels);BSC/3in1;Wheelchair (measurements PT);Wheelchair cushion (measurements PT)    Recommendations for Other Services       Precautions / Restrictions Precautions Precautions: Fall Restrictions Weight Bearing Restrictions: Yes RLE Weight Bearing: Non weight bearing     Mobility  Bed Mobility Overal bed mobility: (P) Modified Independent               Patient Response: (P) Impulsive  Transfers Overall transfer level: (P) Needs assistance Equipment used: (P) Rolling walker (2  wheels) Transfers: (P) Sit to/from Stand Sit to Stand: (P) Contact guard assist, Min assist                Ambulation/Gait Ambulation/Gait assistance: (P) Contact guard assist, +2 safety/equipment Gait Distance (Feet): (P) 100 Feet Assistive device: (P) Rolling walker (2 wheels) Gait Pattern/deviations: (P) Step-to pattern Gait velocity: (P) decreased     General Gait Details: (P) +2 for safety.  overall improved gait stepping up into walker today but remains generally unsafe   Stairs             Wheelchair Mobility     Tilt Bed Tilt Bed Patient Response: (P) Impulsive  Modified Rankin (Stroke Patients Only)       Balance Overall balance assessment: Needs assistance Sitting-balance support: No upper extremity supported, Feet supported Sitting balance-Leahy Scale: Good     Standing balance support: Bilateral upper extremity supported, During functional activity, Reliant on assistive device for balance                                Cognition Arousal: Alert Behavior During Therapy: Impulsive Overall Cognitive Status: Within Functional Limits for tasks assessed Area of Impairment: Safety/judgement                         Safety/Judgement: Decreased awareness of deficits, Decreased awareness of safety     General Comments: Limited activity d/t R LE pain        Exercises      General Comments  Pertinent Vitals/Pain Pain Assessment Pain Assessment: Faces Faces Pain Scale: Hurts even more Pain Location: RLE Pain Descriptors / Indicators: Burning, Aching, Throbbing Pain Intervention(s): Limited activity within patient's tolerance, Monitored during session, Repositioned    Home Living                          Prior Function            PT Goals (current goals can now be found in the care plan section) Progress towards PT goals: Progressing toward goals    Frequency    Min 1X/week      PT  Plan      Co-evaluation              AM-PAC PT "6 Clicks" Mobility   Outcome Measure  Help needed turning from your back to your side while in a flat bed without using bedrails?: None Help needed moving from lying on your back to sitting on the side of a flat bed without using bedrails?: None Help needed moving to and from a bed to a chair (including a wheelchair)?: A Little Help needed standing up from a chair using your arms (e.g., wheelchair or bedside chair)?: A Little Help needed to walk in hospital room?: A Little Help needed climbing 3-5 steps with a railing? : Total 6 Click Score: 18    End of Session Equipment Utilized During Treatment: Gait belt Activity Tolerance: Patient limited by pain;Patient limited by fatigue Patient left: in bed;with call bell/phone within reach;with bed alarm set;with family/visitor present Nurse Communication: Mobility status;Precautions PT Visit Diagnosis: Unsteadiness on feet (R26.81);Other abnormalities of gait and mobility (R26.89);Muscle weakness (generalized) (M62.81);Pain Pain - Right/Left: Right Pain - part of body: Leg     Time: 1150-1158 PT Time Calculation (min) (ACUTE ONLY): 8 min  Charges:    $Gait Training: 8-22 mins PT General Charges $$ ACUTE PT VISIT: 1 Visit                   Danielle Dess, PTA 05/28/23, 1:14 PM

## 2023-05-28 NOTE — Progress Notes (Signed)
PROGRESS NOTE    HERBET Williams  ZOX:[id number] DOB: Aug 21, 1961 DOA: 05/18/2023 PCP: Gildardo Pounds, PA    Brief Narrative:   Brendan Williams is a 62 y.o. male with medical history significant of severe PAD s/p stenting (Most recent April 2023), tobacco use disorder, neuropathy, who presented to the ED due to right foot pain and redness.  He had been experiencing pain in the right foot since he recently saw his vascular surgeon.  However, pain in the right foot had progressively worsened about 3 to 4 days prior to admission.  Noticed purplish/redness discoloration on the dorsal aspect of the right foot and toes.  He also endorsed fever at home.   Workup revealed right foot cellulitis and critical right limb ischemia.  Status post angiography with vascular surgery on 8/5.    Status post femoral endarterectomy, lower extremity angiogram, mechanical thrombectomy, angioplasty with stent placement on 8/7.  Doing well postoperatively.  Seen in consultation by podiatry.  No amputation warranted at this time.  8/12: Podiatry will reevaluate wound today.  Likely no plans for amputation until wound fully demarcates.  Case discussed with vascular surgery.  No further vascular intervention warranted during admission.  Infectious disease consulted for consideration of long-term IV antibiotic therapy.   Assessment & Plan:   Principal Problem:   Critical limb ischemia of right lower extremity (HCC) Active Problems:   Tobacco use disorder   Cellulitis   Cellulitis and abscess of toe of right foot  PVD, critical limb ischemia of right lower extremity:  Unable to exclude overlying cellulitis Status post angiography with vascular surgery on 8/5 Status post right endarterectomies with angioplasty and stent placement 8/7 Plan: Continue aspirin 81 mg daily Continue Eliquis 5 mg twice daily Has completed 10 days of broad-spectrum IV antibiotics No culture data to target antibiotic therapy Podiatry  to reevaluate today ID consulted for consideration of antibiotic therapy  Right foot cellulitis:  Completed 10 days of broad-spectrum IV antibiotics vancomycin and ceftriaxone Blood cultures remain no growth No wound culture to target antibiotic therapy No evidence of osteomyelitis on MRI right foot ID consulted 8/12   Hypotension: Improved   Tobacco use disorder: Counseled to quit smoking cigarettes.    DVT prophylaxis: IV heparin Code Status: Full Family Communication: Spouse at bedside 8/6, 8/7, 8/10, 8/11, 8/12 Disposition Plan: Status is: Inpatient Remains inpatient appropriate because: Critical limb ischemia.  Lower extremity cellulitis on IV antibiotics   Level of care: Med-Surg  Consultants:  Vascular surgery  Procedures:  Lower extremity angiogram 8/5  Antimicrobials: Vancomycin Ceftriaxone   Subjective: Patient seen and examined.  Remains awake and alert.  No visible distress.  Wife at bedside.  Objective: Vitals:   05/27/23 1930 05/28/23 0333 05/28/23 0832 05/28/23 1018  BP: 99/77 101/64 (!) 91/54 99/67  Pulse: 69 63 (!) 53 67  Resp: 20 16 18    Temp: 98.5 F (36.9 C) 98.2 F (36.8 C) 98 F (36.7 C)   TempSrc: Oral Oral    SpO2: 96% 100% 100%   Weight:      Height:        Intake/Output Summary (Last 24 hours) at 05/28/2023 1056 Last data filed at 05/28/2023 0503 Gross per 24 hour  Intake 939.67 ml  Output 1350 ml  Net -410.33 ml   Filed Weights   05/18/23 1251 05/23/23 1203 05/23/23 1955  Weight: 78.2 kg 78.2 kg 72.2 kg    Examination:  General exam: No acute distress Respiratory system: Lungs clear.  Normal work of breathing.  Room air Cardiovascular system: S1, S2, RRR, no murmurs, no pedal edema Gastrointestinal system: Soft, nontender, nondistended, + bowel sounds Central nervous system: Alert and oriented. No focal neurological deficits. Extremities: Decreased power bilateral lower extremities Skin: Purple discoloration of right  foot and toes.  Status post toe amputations.  Right foot tender to palpation, pulses improved Psychiatry: Judgement and insight appear normal. Mood & affect appropriate.     Data Reviewed: I have personally reviewed following labs and imaging studies  CBC: Recent Labs  Lab 05/22/23 0307 05/24/23 0012 05/24/23 0352 05/25/23 0336 05/26/23 0529  WBC 10.1 12.9* 14.3* 11.6* 9.7  HGB 13.8 11.5* 11.2* 11.6* 10.3*  HCT 42.2 33.1* 32.4* 35.3* 30.2*  MCV 94.2 90.7 90.3 95.4 92.9  PLT 379 328 325 324 311   Basic Metabolic Panel: Recent Labs  Lab 05/23/23 0524 05/24/23 0012 05/25/23 0336 05/26/23 0529  NA 136 135  --  139  K 3.4* 3.9  --  3.2*  CL 106 106  --  109  CO2 23 17*  --  23  GLUCOSE 99 211*  --  102*  BUN <5* 7*  --  5*  CREATININE 0.68 0.75 0.82 0.74  CALCIUM 8.5* 8.0*  --  8.2*   GFR: Estimated Creatinine Clearance: 97.8 mL/min (by C-G formula based on SCr of 0.74 mg/dL). Liver Function Tests: No results for input(s): "AST", "ALT", "ALKPHOS", "BILITOT", "PROT", "ALBUMIN" in the last 168 hours.  No results for input(s): "LIPASE", "AMYLASE" in the last 168 hours. No results for input(s): "AMMONIA" in the last 168 hours. Coagulation Profile: No results for input(s): "INR", "PROTIME" in the last 168 hours.  Cardiac Enzymes: No results for input(s): "CKTOTAL", "CKMB", "CKMBINDEX", "TROPONINI" in the last 168 hours. BNP (last 3 results) No results for input(s): "PROBNP" in the last 8760 hours. HbA1C: No results for input(s): "HGBA1C" in the last 72 hours. CBG: Recent Labs  Lab 05/23/23 1934 05/24/23 2048  GLUCAP 165* 101*   Lipid Profile: No results for input(s): "CHOL", "HDL", "LDLCALC", "TRIG", "CHOLHDL", "LDLDIRECT" in the last 72 hours. Thyroid Function Tests: No results for input(s): "TSH", "T4TOTAL", "FREET4", "T3FREE", "THYROIDAB" in the last 72 hours. Anemia Panel: No results for input(s): "VITAMINB12", "FOLATE", "FERRITIN", "TIBC", "IRON",  "RETICCTPCT" in the last 72 hours. Sepsis Labs: No results for input(s): "PROCALCITON", "LATICACIDVEN" in the last 168 hours.  Recent Results (from the past 240 hour(s))  Culture, blood (single)     Status: None   Collection Time: 05/18/23  3:15 PM   Specimen: BLOOD LEFT ARM  Result Value Ref Range Status   Specimen Description BLOOD LEFT ARM  Final   Special Requests   Final    BOTTLES DRAWN AEROBIC AND ANAEROBIC Blood Culture adequate volume   Culture   Final    NO GROWTH 5 DAYS Performed at [facility], [address], [place], [place] [address]    Report Status 05/23/2023 FINAL  Final         Radiology Studies: No results found.      Scheduled Meds:  sodium chloride   Intravenous Once   apixaban  5 mg Oral BID   aspirin EC  81 mg Oral Q0600   atorvastatin  40 mg Oral Daily   Chlorhexidine Gluconate Cloth  6 each Topical Daily   docusate sodium  100 mg Oral Daily   famotidine  20 mg Oral BID   gabapentin  300 mg Oral BID   sodium chloride  flush  3 mL Intravenous Q12H   Continuous Infusions:  sodium chloride     magnesium sulfate bolus IVPB       LOS: 10 days      Tresa Moore, MD [facility]   If 7PM-7AM, please contact night-coverage  05/28/2023, 10:56 AM

## 2023-05-28 NOTE — NC FL2 (Signed)
[place] MEDICAID FL2 LEVEL OF CARE FORM     IDENTIFICATION  Patient Name: BILBO GIESKE Birthdate: 1961-08-30 Sex: male Admission Date (Current Location): 05/18/2023  [facility] and [place] Number:  Chiropodist and Address:  [facility], [address], [place], [place] [address]      Provider Number: [id number]  Attending Physician Name and Address:  Tresa Moore, MD  Relative Name and Phone Number:       Current Level of Care: Hospital Recommended Level of Care: Skilled Nursing Facility Prior Approval Number:    Date Approved/Denied:   PASRR Number: [id number]  Discharge Plan: SNF    Current Diagnoses: Patient Active Problem List   Diagnosis Date Noted   Critical limb ischemia of right lower extremity (HCC) 05/18/2023   Cellulitis 05/18/2023   Cellulitis and abscess of toe of right foot 05/18/2023   Atherosclerosis of native arteries of extremity with rest pain (HCC) 11/25/2019   PAD (peripheral artery disease) (HCC) 11/25/2019   Tobacco use disorder 11/11/2019   Atherosclerosis of native arteries of extremity with intermittent claudication (HCC) 11/11/2019    Orientation RESPIRATION BLADDER Height & Weight     Self, Time, Situation, Place  Normal Continent Weight: 159 lb 2.8 oz (72.2 kg) Height:  6\' 1"  (185.4 cm)  BEHAVIORAL SYMPTOMS/MOOD NEUROLOGICAL BOWEL NUTRITION STATUS   (None)  (None) Continent Diet (Heart healthy)  AMBULATORY STATUS COMMUNICATION OF NEEDS Skin   Limited Assist Verbally Other (Comment), Surgical wounds, Wound Vac (Erythema/redness. Incision on right groin: Prevena wound vac.)                       Personal Care Assistance Level of Assistance  Bathing, Feeding, Dressing Bathing Assistance: Limited assistance Feeding assistance: Limited assistance Dressing Assistance: Limited assistance     Functional Limitations Info  Sight, Hearing, Speech Sight Info: Adequate Hearing Info:  Adequate Speech Info: Adequate    SPECIAL CARE FACTORS FREQUENCY  PT (By licensed PT), OT (By licensed OT)     PT Frequency: 5 x week OT Frequency: 5 x week            Contractures Contractures Info: Not present    Additional Factors Info  Code Status, Allergies Code Status Info: Full code Allergies Info: NKDA           Current Medications (05/28/2023):  This is the current hospital active medication list Current Facility-Administered Medications  Medication Dose Route Frequency Provider Last Rate Last Admin   0.9 %  sodium chloride infusion (Manually program via Guardrails IV Fluids)   Intravenous Once Annice Needy, MD   Held at 05/23/23 1000   0.9 %  sodium chloride infusion  500 mL Intravenous Once PRN Annice Needy, MD       acetaminophen (TYLENOL) tablet 650 mg  650 mg Oral Q6H PRN Annice Needy, MD   650 mg at 05/26/23 2008   Or   acetaminophen (TYLENOL) suppository 650 mg  650 mg Rectal Q6H PRN Annice Needy, MD       ALPRAZolam Prudy Feeler) tablet 0.25 mg  0.25 mg Oral TID PRN Annice Needy, MD   0.25 mg at 05/27/23 2215   alum & mag hydroxide-simeth (MAALOX/MYLANTA) 200-200-20 MG/5ML suspension 15-30 mL  15-30 mL Oral Q2H PRN Annice Needy, MD       apixaban Everlene Balls) tablet 5 mg  5 mg Oral BID Lolita Patella B, MD   5 mg at  05/28/23 1030   aspirin EC tablet 81 mg  81 mg Oral Q0600 Annice Needy, MD   81 mg at 05/28/23 1610   atorvastatin (LIPITOR) tablet 40 mg  40 mg Oral Daily Annice Needy, MD   40 mg at 05/28/23 1031   Chlorhexidine Gluconate Cloth 2 % PADS 6 each  6 each Topical Daily Annice Needy, MD   6 each at 05/28/23 1031   docusate sodium (COLACE) capsule 100 mg  100 mg Oral Daily Annice Needy, MD   100 mg at 05/28/23 1030   famotidine (PEPCID) tablet 20 mg  20 mg Oral BID Tressie Ellis, RPH   20 mg at 05/28/23 1031   gabapentin (NEURONTIN) capsule 300 mg  300 mg Oral BID Annice Needy, MD   300 mg at 05/28/23 1030   guaiFENesin-dextromethorphan (ROBITUSSIN DM)  100-10 MG/5ML syrup 15 mL  15 mL Oral Q4H PRN Annice Needy, MD       hydrALAZINE (APRESOLINE) injection 5 mg  5 mg Intravenous Q20 Min PRN Annice Needy, MD       labetalol (NORMODYNE) injection 10 mg  10 mg Intravenous Q10 min PRN Dew, Marlow Baars, MD       magnesium sulfate IVPB 2 g 50 mL  2 g Intravenous Daily PRN Annice Needy, MD       metoprolol tartrate (LOPRESSOR) injection 2-5 mg  2-5 mg Intravenous Q2H PRN Annice Needy, MD       morphine (PF) 2 MG/ML injection 2-5 mg  2-5 mg Intravenous Q1H PRN Annice Needy, MD   2 mg at 05/28/23 1027   ondansetron (ZOFRAN) tablet 4 mg  4 mg Oral Q6H PRN Annice Needy, MD       Or   ondansetron (ZOFRAN) injection 4 mg  4 mg Intravenous Q6H PRN Annice Needy, MD       Oral care mouth rinse  15 mL Mouth Rinse PRN Dew, Marlow Baars, MD       oxyCODONE-acetaminophen (PERCOCET/ROXICET) 5-325 MG per tablet 1-2 tablet  1-2 tablet Oral Q4H PRN Annice Needy, MD   2 tablet at 05/28/23 0338   phenol (CHLORASEPTIC) mouth spray 1 spray  1 spray Mouth/Throat PRN Annice Needy, MD       polyethylene glycol (MIRALAX / GLYCOLAX) packet 17 g  17 g Oral Daily PRN Annice Needy, MD   17 g at 05/20/23 1202   potassium chloride SA (KLOR-CON M) CR tablet 20-40 mEq  20-40 mEq Oral Daily PRN Annice Needy, MD       senna-docusate (Senokot-S) tablet 1 tablet  1 tablet Oral QHS PRN Annice Needy, MD       sodium chloride flush (NS) 0.9 % injection 3 mL  3 mL Intravenous Q12H Annice Needy, MD   3 mL at 05/28/23 1031   sorbitol 70 % solution 30 mL  30 mL Oral Daily PRN Annice Needy, MD         Discharge Medications: Please see discharge summary for a list of discharge medications.  Relevant Imaging Results:  Relevant Lab Results:   Additional Information SS#: [id number]. Might need IV antibiotics depending on what infectious disease says.  Margarito Liner, LCSW

## 2023-05-28 NOTE — Progress Notes (Signed)
  Progress Note    05/28/2023 9:46 AM 5 Days Post-Op  Subjective:  Brendan Williams is a 62 yo male now POD# 5 from right lower extremity common femoral and superficial endarterectomies with mechanical thrombectomy of the right anterior tibial and dorsalis pedis arteries.  Patient is resting comfortably in bed this morning.  Patient states he has been up and ambulating without putting pressure on the forefoot.  He does state that it is still somewhat painful with some numbness and tingling but it feels better this morning and it is warm.  No complaints overnight.  Vitals are remained stable.  Vitals:   05/28/23 0333 05/28/23 0832  BP: 101/64 (!) 91/54  Pulse: 63 (!) 53  Resp: 16 18  Temp: 98.2 F (36.8 C) 98 F (36.7 C)  SpO2: 100% 100%   Physical Exam: Cardiac:  RRR, Normal S1, S2. No murmurs Lungs:  Scattered Rhonchi on auscultation. Normal respiratory effort.  Incisions:  Right Groin incision with Prevena Wound Vac in place. No hematoma to note. Left Groin from prior angio. With dressing Clean dry and intact. No hematoma to note.  Extremities:  Left lower extremity with palpable pulses and warm to touch. Right lower extremity with strong lower doppler pulses. Warm to touch from mid calf to mid foot.  Abdomen:  Positive bowel sounds throughout, soft, non tender and non distended.  Neurologic: AAOx3 follows commands and answers questions appropriately.   CBC    Component Value Date/Time   WBC 9.7 05/26/2023 0529   RBC 3.25 (L) 05/26/2023 0529   HGB 10.3 (L) 05/26/2023 0529   HCT 30.2 (L) 05/26/2023 0529   PLT 311 05/26/2023 0529   MCV 92.9 05/26/2023 0529   MCH 31.7 05/26/2023 0529   MCHC 34.1 05/26/2023 0529   RDW 13.4 05/26/2023 0529    BMET    Component Value Date/Time   NA 139 05/26/2023 0529   K 3.2 (L) 05/26/2023 0529   CL 109 05/26/2023 0529   CO2 23 05/26/2023 0529   GLUCOSE 102 (H) 05/26/2023 0529   BUN 5 (L) 05/26/2023 0529   CREATININE 0.74 05/26/2023  0529   CALCIUM 8.2 (L) 05/26/2023 0529   GFRNONAA >60 05/26/2023 0529   GFRAA >60 12/04/2019 0523    INR    Component Value Date/Time   INR 1.1 05/18/2023 1253     Intake/Output Summary (Last 24 hours) at 05/28/2023 0946 Last data filed at 05/28/2023 0503 Gross per 24 hour  Intake 939.67 ml  Output 1350 ml  Net -410.33 ml     Assessment/Plan:  62 y.o. male is s/p POD#2 from right lower extremity common femoral and superficial endarterectomies with mechanical thrombectomy of the right anterior tibial and dorsalis pedis arteries.  5 Days Post-Op   PLAN: Reconsult podiatry for input related to the patient's toes and foot with possible upcoming surgery. Pain medication as needed. Ambulation with assistance. PT OT.  DVT prophylaxis: ASA 81 mg daily, Eliquis 5 mg twice daily.   [name] 05/28/2023 9:46 AM

## 2023-05-28 NOTE — Progress Notes (Signed)
MD has been informed about the patient's BP on the last couple of days being in the 80's. The patient is asymptomatic. MD reports to monitor the patient at this time no new order. Charge nurse notified and tech notified.   05/28/23 1735 05/28/23 1739  Assess: MEWS Score  Temp 98 F (36.7 C)  --   BP (!) 71/59 (!) 83/59  MAP (mmHg) 65 66  Pulse Rate 61  --   ECG Heart Rate  --  (!) 54  Resp 18  --   Level of Consciousness Alert  --   SpO2 100 %  --   O2 Device Room Air  --   Patient Activity (if Appropriate) In bed  --   Assess: MEWS Score  MEWS Temp 0 0  MEWS Systolic 2 1  MEWS Pulse 0 0  MEWS RR 0 0  MEWS LOC 0 0  MEWS Score 2 1  MEWS Score Color Yellow Green  Assess: if the MEWS score is Yellow or Red  Were vital signs accurate and taken at a resting state? Yes  --   Does the patient meet 2 or more of the SIRS criteria? No  --   MEWS guidelines implemented  No, previously yellow, continue vital signs every 4 hours  --   Provider Notification  Provider Name/Title Georgeann Oppenheim  --   Date Provider Notified 05/28/23  --   Time Provider Notified 1736  --   Method of Notification Page  --   Notification Reason Change in status  --   Provider response No new orders  --   Date of Provider Response 05/28/23  --   Time of Provider Response 1740  --   Notify: Rapid Response  Name of Rapid Response RN Notified n/a  --   Assess: SIRS CRITERIA  SIRS Temperature  0 0  SIRS Pulse 0 0  SIRS Respirations  0 0  SIRS WBC 0 0  SIRS Score Sum  0 0

## 2023-05-28 NOTE — TOC Progression Note (Addendum)
Transition of Care [facility]) - Progression Note    Patient Details  Name: Brendan Williams MRN: [id number] Date of Birth: May 05, 1961  Transition of Care [facility]) CM/SW Contact  Margarito Liner, LCSW Phone Number: 05/28/2023, 11:01 AM  Clinical Narrative:  Patient is agreeable to SNF placement. First preference is [facility]. CSW notified him that he might have to be placed out of county depending on [place] bed availability.   12:58 pm: Expanded search to other counties.  Expected Discharge Plan: Skilled Nursing Facility Barriers to Discharge: Continued Medical Work up  Expected Discharge Plan and Services     Post Acute Care Choice: Skilled Nursing Facility Living arrangements for the past 2 months: Single Family Home                                       Social Determinants of Health (SDOH) Interventions SDOH Screenings   Food Insecurity: No Food Insecurity (05/18/2023)  Housing: Low Risk  (05/18/2023)  Transportation Needs: No Transportation Needs (05/18/2023)  Utilities: Not At Risk (05/18/2023)  Tobacco Use: High Risk (05/23/2023)    Readmission Risk Interventions     No data to display

## 2023-05-28 NOTE — Progress Notes (Signed)
Daily Progress Note   Subjective  - 5 Days Post-Op  Follow-up right foot critical imb ischemia.  Asked to see patient in regards to possible digital amputation for gangrenous changes.  Patient underwent endarterectomy and thrombectomy to the right lower extremity.  Patient is status post fifth ray amputation remotely.      Objective Vitals:   05/27/23 1930 05/28/23 0333 05/28/23 0832 05/28/23 1018  BP: 99/77 101/64 (!) 91/54 99/67  Pulse: 69 63 (!) 53 67  Resp: 20 16 18    Temp: 98.5 F (36.9 C) 98.2 F (36.8 C) 98 F (36.7 C)   TempSrc: Oral Oral    SpO2: 96% 100% 100%   Weight:      Height:        Physical Exam: Nonpalpable pulses to the right foot.  Right foot now does appear to be warm to touch, improved.  Still appears to be dysvascular dorsally to the dorsal midfoot and appears to have worsening gangrenous changes to the third and fourth toes as well as distal tips of the first and second toes.  Overall improvement in erythema and edema present to the right foot since vascular procedure but does have a hemorrhagic blister present to the dorsal aspect of the right midfoot, no current openings.  Appears to be dry and stable.       Laboratory CBC    Component Value Date/Time   WBC 9.7 05/26/2023 0529   HGB 10.3 (L) 05/26/2023 0529   HCT 30.2 (L) 05/26/2023 0529   PLT 311 05/26/2023 0529    BMET    Component Value Date/Time   NA 139 05/26/2023 0529   K 3.2 (L) 05/26/2023 0529   CL 109 05/26/2023 0529   CO2 23 05/26/2023 0529   GLUCOSE 102 (H) 05/26/2023 0529   BUN 5 (L) 05/26/2023 0529   CREATININE 0.74 05/26/2023 0529   CALCIUM 8.2 (L) 05/26/2023 0529   GFRNONAA >60 05/26/2023 0529   GFRAA >60 12/04/2019 0523    Assessment/Planning: Critical limb ischemia right lower extremity, gangrene right foot  At this time I would hold on amputation to the toes.  I would continue to monitor for further demarcation. Discussed with patient that there has been some  improvement overall in his flow and the skin appearance compared to previous visit.  Patient still has gangrenous necrosis though to the 1st through 4th toes and appears to extend to the forefoot and potentially over the midfoot.  Still needs more time to demarcate. I discussed this with the patient that he is at high risk of amputation of the lower leg.  Continue to prevent any pressure and irritation from the region. Appreciate recommendations for antibiotic therapy but believe patient's skin changes are more so related to gangrene versus cellulitis.  Antibiotics at this point would likely just be for prophylaxis. Recommend Betadine paint be applied to digits and areas of dry gangrene to keep them dry.  Surgical shoe ordered.  From our perspective patient may be partial weightbearing with heel contact for short distances. It could take a couple of weeks for the tissues to demarcate to the point where we could consider amputation.  Can follow-up on an outpatient basis.  Would like to see back in a week for further evaluation. Will likely need transmetatarsal amputation in the future if skin improves.  In the current state concern for below-knee amputation  Podiatry team to sign off at this time.  Rosetta Posner, DPM  05/28/2023, 12:19 PM

## 2023-05-28 NOTE — Consult Note (Signed)
NAME: Brendan Williams  DOB: 02-Jun-1961  MRN: [id number]  Date/Time: 05/28/2023 1:47 PM  REQUESTING PROVIDER: Dr. Georgeann Williams Subjective:  REASON FOR CONSULT: rt foot gangrene VS infection ? Brendan Williams is a 62 y.o. with a history of severe peripheral artery disease,angio /stents/b/l femoral enarterectomy rt 5th toe amputation. smoker presented on 05/18/23  with severe pain rt foot/leg for more than 2 weeks. He was found to have purplish discoloration of toes of rt foot- diagnosed as ischemia Vitals in the ED  05/18/23  BP 92/65  Temp 98.1 F (36.7 C)  Pulse Rate 71  Resp 20  SpO2 96 %   Labs revealed  Latest Reference Range & Units 05/18/23  WBC 4.0 - 10.5 K/uL 13.6 (H)  Hemoglobin 13.0 - 17.0 g/dL 40.9  HCT 81.1 - 91.4 % 43.7  Platelets 150 - 400 K/uL 410 (H)  Creatinine 0.61 - 1.24 mg/dL 7.82  One set of blood culture sent He was started on vanco and ceftriaxone  He underwent angio on 05/21/23 and it revealed SFA occlusion, SFA and popliteal stents. On 05/23/23 he underwent   Right common femoral and superficial femoral artery endarterectomies and patch angioplasty 2.   Redo surgery right femoral artery exposure 3.   Right lower extremity angiogram 4.   Mechanical thrombectomy using the penumbra CAT 6 device to the right anterior tibial and dorsalis pedis arteries 5.   Atherectomy of the right SFA and popliteal arteries using the 8 [place] Rex device 6.   Angioplasty of the right anterior tibial artery with 3 mm diameter by 30 cm length angioplasty balloon 7.   Stent placement x 3 to the right SFA and popliteal arteries using 6 mm diameter by 25 cm length, 7 mm diameter by 15 cm length, an 8 mm diameter by 10 cm length Viabahn stents 8.   Separate and distinct right profunda femoris endarterectomy with separate patch angioplasty 9.   Disposable/incisional VAC dressing placement right groin incision  Podiatrist saw him and waiting for gangrene to delineate  Am asked to see him  regarding antibiotic management  Past Medical History:  Diagnosis Date   Peripheral arterial disease (HCC)     Past Surgical History:  Procedure Laterality Date   AMPUTATION TOE Right 12/02/2019   Procedure: AMPUTATION RIGHT 5TH TOE;  Surgeon: Rosetta Posner, DPM;  Location: [facility];  Service: Podiatry;  Laterality: Right;   APPLICATION OF WOUND VAC Bilateral 11/28/2019   Procedure: APPLICATION OF WOUND VAC;  Surgeon: Annice Needy, MD;  Location: [facility];  Service: Vascular;  Laterality: Bilateral;  Prevena    BACK SURGERY     lower ruptured disk   CENTRAL VENOUS CATHETER INSERTION  11/28/2019   Procedure: INSERTION CENTRAL LINE ADULT;  Surgeon: Annice Needy, MD;  Location: [facility];  Service: Vascular;;   ENDARTERECTOMY FEMORAL Bilateral 11/28/2019   Procedure: ENDARTERECTOMY FEMORAL;  Surgeon: Annice Needy, MD;  Location: [facility];  Service: Vascular;  Laterality: Bilateral;   ENDARTERECTOMY FEMORAL Right 05/23/2023   Procedure: ENDARTERECTOMY FEMORAL REDO;  Surgeon: Annice Needy, MD;  Location: [facility];  Service: Vascular;  Laterality: Right;   ENDOVASCULAR REPAIR/STENT GRAFT Right 05/23/2023   Procedure: ENDOVASCULAR REPAIR/STENT GRAFT;  Surgeon: Annice Needy, MD;  Location: [facility];  Service: Vascular;  Laterality: Right;  Right SFA Stents   LOWER EXTREMITY ANGIOGRAPHY Right 11/14/2019   Procedure: LOWER EXTREMITY ANGIOGRAPHY;  Surgeon: Annice Needy, MD;  Location: [facility];  Service:  Cardiovascular;  Laterality: Right;   LOWER EXTREMITY ANGIOGRAPHY Right 12/19/2021   Procedure: Lower Extremity Angiography;  Surgeon: Annice Needy, MD;  Location: [facility];  Service: Cardiovascular;  Laterality: Right;   LOWER EXTREMITY ANGIOGRAPHY Right 01/30/2022   Procedure: Lower Extremity Angiography;  Surgeon: Annice Needy, MD;  Location: [facility];  Service: Cardiovascular;  Laterality: Right;   LOWER EXTREMITY ANGIOGRAPHY Right 05/21/2023   Procedure: Lower Extremity  Angiography;  Surgeon: Annice Needy, MD;  Location: [facility];  Service: Cardiovascular;  Laterality: Right;   MECHANICAL THROMBECTOMY WITH AORTOGRAM AND INTERVENTION  05/23/2023   Procedure: MECHANICAL THROMBECTOMY right popliteal artery;  Surgeon: Annice Needy, MD;  Location: [facility];  Service: Vascular;;   STOMACH SURGERY     TONSILECTOMY, ADENOIDECTOMY, BILATERAL MYRINGOTOMY AND TUBES      Social History   Socioeconomic History   Marital status: Married    Spouse name: Not on file   Number of children: Not on file   Years of education: Not on file   Highest education level: Not on file  Occupational History   Not on file  Tobacco Use   Smoking status: Every Day    Current packs/day: 1.00    Types: Cigarettes   Smokeless tobacco: Never  Vaping Use   Vaping status: Never Used  Substance and Sexual Activity   Alcohol use: Yes    Alcohol/week: 5.0 standard drinks of alcohol    Types: 5 Cans of beer per week    Comment: 4-5 beers a week   Drug use: Yes    Frequency: 3.0 times per week    Types: Marijuana    Comment: 3-4 times a week   Sexual activity: Yes  Other Topics Concern   Not on file  Social History Narrative   Live in private residence with wife, Brendan Williams.   Social Determinants of Health   Financial Resource Strain: Not on file  Food Insecurity: No Food Insecurity (05/18/2023)   Hunger Vital Sign    Worried About Running Out of Food in the Last Year: Never true    Ran Out of Food in the Last Year: Never true  Transportation Needs: No Transportation Needs (05/18/2023)   PRAPARE - Administrator, Civil Service (Medical): No    Lack of Transportation (Non-Medical): No  Physical Activity: Not on file  Stress: Not on file  Social Connections: Not on file  Intimate Partner Violence: Patient Unable To Answer (05/18/2023)   Humiliation, Afraid, Rape, and Kick questionnaire    Fear of Current or Ex-Partner: Patient unable to answer    Emotionally  Abused: Patient unable to answer    Physically Abused: Patient unable to answer    Sexually Abused: Patient unable to answer    Family History  Problem Relation Age of Onset   Leukemia Mother    Heart disease Father        a. CABG   Heart disease Sister        a. MVR   Cancer Sister    Cancer Brother    CAD Brother        a. CABG   No Known Allergies I? Current Facility-Administered Medications  Medication Dose Route Frequency Provider Last Rate Last Admin   0.9 %  sodium chloride infusion (Manually program via Guardrails IV Fluids)   Intravenous Once Annice Needy, MD   Held at 05/23/23 1000   0.9 %  sodium chloride  infusion  500 mL Intravenous Once PRN Annice Needy, MD       acetaminophen (TYLENOL) tablet 650 mg  650 mg Oral Q6H PRN Annice Needy, MD   650 mg at 05/26/23 2008   Or   acetaminophen (TYLENOL) suppository 650 mg  650 mg Rectal Q6H PRN Annice Needy, MD       ALPRAZolam Prudy Feeler) tablet 0.25 mg  0.25 mg Oral TID PRN Annice Needy, MD   0.25 mg at 05/27/23 2215   alum & mag hydroxide-simeth (MAALOX/MYLANTA) 200-200-20 MG/5ML suspension 15-30 mL  15-30 mL Oral Q2H PRN Annice Needy, MD       apixaban Everlene Balls) tablet 5 mg  5 mg Oral BID Lolita Patella B, MD   5 mg at 05/28/23 1030   aspirin EC tablet 81 mg  81 mg Oral Q0600 Annice Needy, MD   81 mg at 05/28/23 8756   atorvastatin (LIPITOR) tablet 40 mg  40 mg Oral Daily Annice Needy, MD   40 mg at 05/28/23 1031   Chlorhexidine Gluconate Cloth 2 % PADS 6 each  6 each Topical Daily Annice Needy, MD   6 each at 05/28/23 1031   docusate sodium (COLACE) capsule 100 mg  100 mg Oral Daily Annice Needy, MD   100 mg at 05/28/23 1030   famotidine (PEPCID) tablet 20 mg  20 mg Oral BID Tressie Ellis, RPH   20 mg at 05/28/23 1031   gabapentin (NEURONTIN) capsule 300 mg  300 mg Oral BID Annice Needy, MD   300 mg at 05/28/23 1030   guaiFENesin-dextromethorphan (ROBITUSSIN DM) 100-10 MG/5ML syrup 15 mL  15 mL Oral Q4H PRN Annice Needy,  MD       hydrALAZINE (APRESOLINE) injection 5 mg  5 mg Intravenous Q20 Min PRN Dew, Marlow Baars, MD       labetalol (NORMODYNE) injection 10 mg  10 mg Intravenous Q10 min PRN Dew, Marlow Baars, MD       magnesium sulfate IVPB 2 g 50 mL  2 g Intravenous Daily PRN Annice Needy, MD       metoprolol tartrate (LOPRESSOR) injection 2-5 mg  2-5 mg Intravenous Q2H PRN Annice Needy, MD       morphine (PF) 2 MG/ML injection 2-5 mg  2-5 mg Intravenous Q1H PRN Annice Needy, MD   2 mg at 05/28/23 1027   ondansetron (ZOFRAN) tablet 4 mg  4 mg Oral Q6H PRN Annice Needy, MD       Or   ondansetron (ZOFRAN) injection 4 mg  4 mg Intravenous Q6H PRN Annice Needy, MD       Oral care mouth rinse  15 mL Mouth Rinse PRN Wyn Quaker, Marlow Baars, MD       oxyCODONE-acetaminophen (PERCOCET/ROXICET) 5-325 MG per tablet 1-2 tablet  1-2 tablet Oral Q4H PRN Annice Needy, MD   2 tablet at 05/28/23 0338   phenol (CHLORASEPTIC) mouth spray 1 spray  1 spray Mouth/Throat PRN Annice Needy, MD       polyethylene glycol (MIRALAX / GLYCOLAX) packet 17 g  17 g Oral Daily PRN Annice Needy, MD   17 g at 05/20/23 1202   potassium chloride SA (KLOR-CON M) CR tablet 20-40 mEq  20-40 mEq Oral Daily PRN Annice Needy, MD       senna-docusate (Senokot-S) tablet 1 tablet  1 tablet Oral QHS PRN Annice Needy, MD  sodium chloride flush (NS) 0.9 % injection 3 mL  3 mL Intravenous Q12H Annice Needy, MD   3 mL at 05/28/23 1031   sorbitol 70 % solution 30 mL  30 mL Oral Daily PRN Annice Needy, MD         Abtx:  Anti-infectives (From admission, onward)    Start     Dose/Rate Route Frequency Ordered Stop   05/24/23 0000  ceFAZolin (ANCEF) IVPB 2g/100 mL premix        2 g 200 mL/hr over 30 Minutes Intravenous Every 8 hours 05/23/23 1950 05/24/23 0832   05/23/23 1138  ceFAZolin (ANCEF) IVPB 2g/100 mL premix        2 g 200 mL/hr over 30 Minutes Intravenous 30 min pre-op 05/23/23 1138 05/23/23 1609   05/21/23 0407  ceFAZolin (ANCEF) IVPB 2g/100 mL premix  Status:   Discontinued        2 g 200 mL/hr over 30 Minutes Intravenous 30 min pre-op 05/21/23 0407 05/21/23 0734   05/19/23 1200  cefTRIAXone (ROCEPHIN) 1 g in sodium chloride 0.9 % 100 mL IVPB        1 g 200 mL/hr over 30 Minutes Intravenous Every 24 hours 05/18/23 1459 05/27/23 1301   05/19/23 0400  vancomycin (VANCOREADY) IVPB 1250 mg/250 mL        1,250 mg 166.7 mL/hr over 90 Minutes Intravenous Every 12 hours 05/18/23 1542 05/27/23 2359   05/18/23 1500  vancomycin (VANCOREADY) IVPB 1750 mg/350 mL        1,750 mg 175 mL/hr over 120 Minutes Intravenous  Once 05/18/23 1414 05/18/23 1758   05/18/23 1400  vancomycin (VANCOCIN) IVPB 1000 mg/200 mL premix  Status:  Discontinued        1,000 mg 200 mL/hr over 60 Minutes Intravenous  Once 05/18/23 1349 05/18/23 1414   05/18/23 1400  cefTRIAXone (ROCEPHIN) 2 g in sodium chloride 0.9 % 100 mL IVPB        2 g 200 mL/hr over 30 Minutes Intravenous  Once 05/18/23 1349 05/18/23 1554       REVIEW OF SYSTEMS:  Const: negative fever, negative chills, negative weight loss Eyes: negative diplopia or visual changes, negative eye pain ENT: negative coryza, negative sore throat Resp: negative cough, hemoptysis, dyspnea Cards: negative for chest pain, palpitations, lower extremity edema GU: negative for frequency, dysuria and hematuria GI: Negative for abdominal pain, diarrhea, bleeding, constipation Skin: negative for rash and pruritus Heme: negative for easy bruising and gum/nose bleeding MS: pain rt foot Neurolo:negative for headaches, dizziness, vertigo, memory problems  Psych: negative for feelings of anxiety, depression  Endocrine: negative for thyroid, diabetes Allergy/Immunology- NKDA Objective:  VITALS:  BP 99/67 (BP Location: Left Arm)   Pulse 67   Temp 98 F (36.7 C)   Resp 18   Ht 6\' 1"  (1.854 m)   Wt 72.2 kg   SpO2 100%   BMI 21.00 kg/m   PHYSICAL EXAM:  General: Alert, cooperative, no distress, appears stated age.  Head:  Normocephalic, without obvious abnormality, atraumatic. Eyes: Conjunctivae clear, anicteric sclerae. Pupils are equal ENT Nares normal. No drainage or sinus tenderness. Lips, mucosa, and tongue normal. No Thrush Neck: Supple, symmetrical, no adenopathy, thyroid: non tender no carotid bruit and no JVD. Back: No CVA tenderness. Lungs: Clear to auscultation bilaterally. No Wheezing or Rhonchi. No rales. Heart: Regular rate and rhythm, no murmur, rub or gallop. Abdomen: Soft, non-tender,not distended. Bowel sounds normal. No masses Extremities: rt femoral area wound vac  rt toes dry gangrene    No purulence Skin: as above Lymph: Cervical, supraclavicular normal. Neurologic: Grossly non-focal Pertinent Labs Lab Results CBC    Component Value Date/Time   WBC 9.7 05/26/2023 0529   RBC 3.25 (L) 05/26/2023 0529   HGB 10.3 (L) 05/26/2023 0529   HCT 30.2 (L) 05/26/2023 0529   PLT 311 05/26/2023 0529   MCV 92.9 05/26/2023 0529   MCH 31.7 05/26/2023 0529   MCHC 34.1 05/26/2023 0529   RDW 13.4 05/26/2023 0529       Latest Ref Rng & Units 05/26/2023    5:29 AM 05/25/2023    3:36 AM 05/24/2023   12:12 AM  CMP  Glucose 70 - 99 mg/dL 161   096   BUN 8 - 23 mg/dL 5   7   Creatinine 0.45 - 1.24 mg/dL 4.09  8.11  9.14   Sodium 135 - 145 mmol/L 139   135   Potassium 3.5 - 5.1 mmol/L 3.2   3.9   Chloride 98 - 111 mmol/L 109   106   CO2 22 - 32 mmol/L 23   17   Calcium 8.9 - 10.3 mg/dL 8.2   8.0       Microbiology: Recent Results (from the past 240 hour(s))  Culture, blood (single)     Status: None   Collection Time: 05/18/23  3:15 PM   Specimen: BLOOD LEFT ARM  Result Value Ref Range Status   Specimen Description BLOOD LEFT ARM  Final   Special Requests   Final    BOTTLES DRAWN AEROBIC AND ANAEROBIC Blood Culture adequate volume   Culture   Final    NO GROWTH 5 DAYS Performed at [facility], [address] [place], [place] [address]    Report Status 05/23/2023 FINAL   Final    IMAGING RESULTS: I have personally reviewed the films ? Impression/Recommendation ?Acute gangrene rt foot  secondary to severearetrial disease S/p Rt common femoral and superficial femoral  endarterectomies with patch angioplasy, separate rt profunda femoris endarterectomy with separate patch angioplasty.. Mechanical thrombectomy of Rt ATA and DPA Atherectomy of rt SFA and Popliteal artery Angioplasy of rt ATA Stent to rt SFA, popliteal artery  No active infection currently Already received nearly 9 days of IV vanco and ceftriaxone Awaiting delineation of gangrene and to see whether circulation would improve No antibiotic needed any more unless he develop wet gangrene, infection  Anemia  Current smoker- says he quit 2 days before coming to hospital? ? Follow up with podiatrist as OP and need for amputation  ___________________________________________________ Discussed with patient, wife at bed side  requesting provider and care team Note:  This document was prepared using Dragon voice recognition software and may include unintentional dictation errors.

## 2023-05-28 NOTE — Plan of Care (Signed)
  Problem: Education: Goal: Knowledge of General Education information will improve Description: Including pain rating scale, medication(s)/side effects and non-pharmacologic comfort measures Outcome: Progressing   Problem: Health Behavior/Discharge Planning: Goal: Ability to manage health-related needs will improve Outcome: Progressing   Problem: Clinical Measurements: Goal: Ability to maintain clinical measurements within normal limits will improve Outcome: Progressing Goal: Will remain free from infection Outcome: Progressing Goal: Diagnostic test results will improve Outcome: Progressing Goal: Respiratory complications will improve Outcome: Progressing Goal: Cardiovascular complication will be avoided Outcome: Progressing   Problem: Nutrition: Goal: Adequate nutrition will be maintained Outcome: Progressing   Problem: Activity: Goal: Risk for activity intolerance will decrease Outcome: Progressing   Problem: Coping: Goal: Level of anxiety will decrease Outcome: Progressing   Problem: Elimination: Goal: Will not experience complications related to bowel motility Outcome: Progressing Goal: Will not experience complications related to urinary retention Outcome: Progressing   Problem: Pain Managment: Goal: General experience of comfort will improve Outcome: Progressing   Problem: Safety: Goal: Ability to remain free from injury will improve Outcome: Progressing   

## 2023-05-29 DIAGNOSIS — I70221 Atherosclerosis of native arteries of extremities with rest pain, right leg: Secondary | ICD-10-CM | POA: Diagnosis not present

## 2023-05-29 MED ORDER — ATORVASTATIN CALCIUM 40 MG PO TABS: 40.0000 mg | ORAL_TABLET | Freq: Every day | ORAL | 1 refills | Status: DC

## 2023-05-29 MED ORDER — OXYCODONE-ACETAMINOPHEN 5-325 MG PO TABS: 1.0000 | ORAL_TABLET | ORAL | 0 refills | Status: DC | PRN

## 2023-05-29 MED ORDER — APIXABAN 5 MG PO TABS: 5.0000 mg | ORAL_TABLET | Freq: Two times a day (BID) | ORAL | 1 refills | Status: DC

## 2023-05-29 MED ORDER — GABAPENTIN 300 MG PO CAPS
300.0000 mg | ORAL_CAPSULE | Freq: Two times a day (BID) | ORAL | 1 refills | Status: DC
Start: 2023-05-29 — End: 2023-05-30

## 2023-05-29 MED ORDER — ATORVASTATIN CALCIUM 40 MG PO TABS
40.0000 mg | ORAL_TABLET | Freq: Every day | ORAL | 1 refills | Status: DC
Start: 2023-05-29 — End: 2023-05-29

## 2023-05-29 MED ORDER — GABAPENTIN 300 MG PO CAPS
300.0000 mg | ORAL_CAPSULE | Freq: Two times a day (BID) | ORAL | 1 refills | Status: DC
Start: 2023-05-29 — End: 2023-05-29

## 2023-05-29 NOTE — TOC Progression Note (Addendum)
Transition of Care [facility]) - Progression Note    Patient Details  Name: Brendan Williams MRN: [id number] Date of Birth: 15-Aug-1961  [facility]) CM/SW Contact  Margarito Liner, LCSW Phone Number: 05/29/2023, 9:53 AM  Clinical Narrative:  Per MD, patient prefers to return home. [facility], [facility], and [facility] are checking to see if they can accept his home health referral for PT and OT. Left message for [facility] Ao, and [facility].   10:39 am: [facility] Lav, [facility], [facility], and [facility] declined referral. [facility] and [facility] are still checking.  11:59 am: [facility] declined. [facility] has to run his insurance. CSW updated patient and his wife. He is agreeable to outpatient therapy if [facility] is unable to accept him. He prefers [facility]. DME recommendation for wheelchair but he declined. He already has 3-in-1 and RW.  Expected Discharge Plan: Skilled Nursing Facility Barriers to Discharge: Continued Medical Work up  Expected Discharge Plan and Services     Post Acute Care Choice: Skilled Nursing Facility Living arrangements for the past 2 months: Single Family Home                                       Social Determinants of Health (SDOH) Interventions SDOH Screenings   Food Insecurity: No Food Insecurity (05/18/2023)  Housing: Low Risk  (05/18/2023)  Transportation Needs: No Transportation Needs (05/18/2023)  Utilities: Not At Risk (05/18/2023)  Tobacco Use: High Risk (05/23/2023)    Readmission Risk Interventions     No data to display

## 2023-05-29 NOTE — Progress Notes (Signed)
Brendan Williams to be discharged Home per MD order. Discussed prescriptions and follow up appointments with the patient. Prescriptions given to patient, medication list explained in detail. Right toes painted with betadine. Post op shoe given to patient. Patient verbalized understanding.  Allergies as of 05/29/2023   No Known Allergies      Medication List     STOP taking these medications    clopidogrel 75 MG tablet Commonly known as: Plavix   HYDROcodone-acetaminophen 5-325 MG tablet Commonly known as: NORCO/VICODIN       TAKE these medications    apixaban 5 MG Tabs tablet Commonly known as: ELIQUIS Take 1 tablet (5 mg total) by mouth 2 (two) times daily.   aspirin EC 81 MG tablet Take 1 tablet (81 mg total) by mouth daily.   atorvastatin 40 MG tablet Commonly known as: Lipitor Take 1 tablet (40 mg total) by mouth daily. What changed:  medication strength how much to take   gabapentin 300 MG capsule Commonly known as: NEURONTIN Take 1 capsule (300 mg total) by mouth 2 (two) times daily.   oxyCODONE-acetaminophen 5-325 MG tablet Commonly known as: PERCOCET/ROXICET Take 1-2 tablets by mouth every 4 (four) hours as needed for moderate pain.        Vitals:   05/29/23 0758 05/29/23 0800  BP: (!) 78/64 (!) 80/62  Pulse:    Resp:    Temp:    SpO2:      Skin clean, dry and intact without evidence of skin break down and or skin tears. IV catheter discontinued intact. Site without signs and symptoms of complications. Dressing and pressure applied. Patient denies pain at this time. No complaints noted.  An After Visit Summary was printed and given to the patient. Patient escorted via wheelchair and discharged Home home via private auto.  Madie Reno, RN

## 2023-05-29 NOTE — Progress Notes (Signed)
Per patient's wife Steward Drone, [facility] is out of stock on pain medication. Patient requested to have medications transferred to [facility]. RN verified with [facility] medication is in stock. MD updated and will transfer medications for discharge.   Madie Reno, RN

## 2023-05-29 NOTE — Progress Notes (Signed)
OT Cancellation Note  Patient Details Name: Brendan Williams MRN: [id number] DOB: July 24, 1961   Cancelled Treatment:    Reason Eval/Treat Not Completed: Other (comment) (Pt declined; states he is d/c home today. Pt states he has shower chair, no difficulty with toilet transfers or dressing; has RW; OT clarified for pt that he should be using RW and not rollator, even though pt states he likes rollator; pt receptive.)  Alvester Morin 05/29/2023, 11:22 AM

## 2023-05-29 NOTE — TOC Transition Note (Signed)
Transition of Care [facility]) - CM/SW Discharge Note   Patient Details  Name: Brendan Williams MRN: [id number] Date of Birth: 1961-05-04  [facility]) CM/SW Contact:  Margarito Liner, LCSW Phone Number: 05/29/2023, 2:53 PM   Clinical Narrative:  Patient has orders to discharge home today. [facility] unable to accept home health referral. Faxed outpatient therapy order form to [facility]. Patient and wife are aware. No further concerns. CSW signing off.   Final next level of care: OP Rehab Barriers to Discharge: Barriers Resolved   Patient Goals and CMS Choice CMS Medicare.gov Compare Post Acute Care list provided to:: Patient Choice offered to / list presented to : Patient  Discharge Placement                  Patient to be transferred to facility by: Wife Name of family member notified: Niki Gameros Patient and family notified of of transfer: 05/29/23  Discharge Plan and Services Additional resources added to the After Visit Summary for       Post Acute Care Choice: Skilled Nursing Facility                               Social Determinants of Health (SDOH) Interventions SDOH Screenings   Food Insecurity: No Food Insecurity (05/18/2023)  Housing: Low Risk  (05/18/2023)  Transportation Needs: No Transportation Needs (05/18/2023)  Utilities: Not At Risk (05/18/2023)  Tobacco Use: High Risk (05/23/2023)     Readmission Risk Interventions     No data to display

## 2023-05-29 NOTE — Progress Notes (Signed)
Occupational Therapy Treatment Patient Details Name: Brendan Williams MRN: [id number] DOB: 1961/08/02 Today's Date: 05/29/2023   History of present illness Pt is a 62 y.o. male presenting to hospital 05/18/23 with worsening pain and redness to dorsum of R foot.  Recent diagnosis 7/17 of critical limb ischemia R LE.  Pt admitted with critical limb ischemia R LE, cellulitis.  S/p 8/5 aortogram and selective R LE angiogram.  S/p 8/7 right lower extremity common femoral and superficial endarterectomies with mechanical thrombectomy of the right anterior tibial and dorsalis pedis arteries.  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   OT comments  Brendan Williams was seen for OT treatment on this date. Upon arrival to room pt seated EOB, eager for d/c but agreeable to tx. Pt requires MIN A don post op shoe in sitting. SUPERVISION + RW for ADL t/f - prefers to maintain NWBing and hop. Educated on Wbing precautions and falls prevention. Spouse reports plan to use 4WW on return home, educated on use of RW for safety. Pt making good progress toward goals, will continue to follow POC. Discharge recommendation remains appropriate.        If plan is discharge home, recommend the following:  A little help with walking and/or transfers;A little help with bathing/dressing/bathroom;Assist for transportation;Help with stairs or ramp for entrance;Assistance with cooking/housework   Equipment Recommendations  BSC/3in1    Recommendations for Other Services      Precautions / Restrictions Precautions Precautions: Fall Restrictions Weight Bearing Restrictions: Yes RLE Weight Bearing: Partial weight bearing RLE Partial Weight Bearing Percentage or Pounds: heel WBing in post op shoe       Mobility Bed Mobility Overal bed mobility: Independent                  Transfers Overall transfer level: Needs assistance Equipment used:  Rolling walker (2 wheels) Transfers: Sit to/from Stand Sit to Stand: Supervision                 Balance Overall balance assessment: Needs assistance Sitting-balance support: No upper extremity supported, Feet supported Sitting balance-Leahy Scale: Normal     Standing balance support: Bilateral upper extremity supported Standing balance-Leahy Scale: Fair                             ADL either performed or assessed with clinical judgement   ADL Overall ADL's : Needs assistance/impaired                                       General ADL Comments: MIN A don post op shoe in sitting. SUPERVISION + RW for ADL t/f - prefers to maintain NWBing .      Cognition Arousal: Alert Behavior During Therapy: WFL for tasks assessed/performed Overall Cognitive Status: Within Functional Limits for tasks assessed                                                     Pertinent Vitals/ Pain       Pain Assessment Pain Assessment: Faces Faces Pain Scale: Hurts little more Pain Location: RLE Pain Descriptors / Indicators: Burning, Aching, Throbbing Pain  Intervention(s): Limited activity within patient's tolerance, Repositioned   Frequency  Min 1X/week        Progress Toward Goals  OT Goals(current goals can now be found in the care plan section)  Progress towards OT goals: Progressing toward goals  Acute Rehab OT Goals Patient Stated Goal: to go home OT Goal Formulation: With patient Time For Goal Achievement: 06/07/23 Potential to Achieve Goals: Good ADL Goals Pt Will Perform Grooming: with modified independence;standing;sitting Pt Will Perform Lower Body Dressing: with modified independence Pt Will Transfer to Toilet: with modified independence;ambulating Pt Will Perform Toileting - Clothing Manipulation and hygiene: with modified independence;sit to/from stand  Plan      Co-evaluation                 AM-PAC OT "6  Clicks" Daily Activity     Outcome Measure   Help from another person eating meals?: None Help from another person taking care of personal grooming?: None Help from another person toileting, which includes using toliet, bedpan, or urinal?: A Little Help from another person bathing (including washing, rinsing, drying)?: A Little Help from another person to put on and taking off regular upper body clothing?: None Help from another person to put on and taking off regular lower body clothing?: A Little 6 Click Score: 21    End of Session Equipment Utilized During Treatment: Rolling walker (2 wheels)  OT Visit Diagnosis: Other abnormalities of gait and mobility (R26.89)   Activity Tolerance Patient tolerated treatment well   Patient Left in bed;with call bell/phone within reach;with family/visitor present   Nurse Communication          Time: 2202-5427 OT Time Calculation (min): 9 min  Charges: OT General Charges $OT Visit: 1 Visit OT Treatments $Self Care/Home Management : 8-22 mins  Kathie Dike, M.S. OTR/L  05/29/23, 3:19 PM  ascom [phone]

## 2023-05-29 NOTE — Discharge Summary (Addendum)
Physician Discharge Summary  Brendan Williams ZOX:[id number] DOB: Jun 11, 1961 DOA: 05/18/2023  PCP: Gildardo Pounds, PA  Admit date: 05/18/2023 Discharge date: 05/29/2023  Admitted From: Home Disposition:  Home  Recommendations for Outpatient Follow-up:  Follow up with PCP in 1-2 weeks Follow-up with vascular surgery 2 weeks Follow-up with podiatry 1 week  Home Health: Yes PT (insurance declined) Equipment/Devices: Right surgical boot  Discharge Condition: Stable CODE STATUS: Full Diet recommendation: Regular  Brief/Interim Summary:  Brendan Williams is a 62 y.o. male with medical history significant of severe PAD s/p stenting (Most recent April 2023), tobacco use disorder, neuropathy, who presented to the ED due to right foot pain and redness.  He had been experiencing pain in the right foot since he recently saw his vascular surgeon.  However, pain in the right foot had progressively worsened about 3 to 4 days prior to admission.  Noticed purplish/redness discoloration on the dorsal aspect of the right foot and toes.  He also endorsed fever at home.   Workup revealed right foot cellulitis and critical right limb ischemia.  Status post angiography with vascular surgery on 8/5.     Status post femoral endarterectomy, lower extremity angiogram, mechanical thrombectomy, angioplasty with stent placement on 8/7.  Doing well postoperatively.   Seen in consultation by podiatry.  No amputation warranted at this time.   8/12: Podiatry will reevaluate wound today.  Likely no plans for amputation until wound fully demarcates.  Case discussed with vascular surgery.  No further vascular intervention warranted during admission.  Infectious disease consulted for consideration of long-term IV antibiotic therapy.  8/13: No antibiotics indicated per infectious disease recommendation.  Case discussed with vascular surgery.  Stable for DC home at this time.  Declining skilled nursing facility placement.   Surgical boot ordered by podiatry.  Follow-up outpatient podiatry 1 week, vascular surgery 2 weeks, PCP 1 week.      Discharge Diagnoses:  Principal Problem:   Critical limb ischemia of right lower extremity (HCC) Active Problems:   Tobacco use disorder   Cellulitis   Cellulitis and abscess of toe of right foot   PVD, critical limb ischemia of right lower extremity:  Unable to exclude overlying cellulitis Status post angiography with vascular surgery on 8/5 Status post right endarterectomies with angioplasty and stent placement 8/7 Plan: Recommendation for skilled nursing facility.  Patient declines and elects to go home.  Okay for discharge home.  Continue aspirin 81 mg daily, Eliquis 5 mg daily.  No further antibiotics.  Surgical boot ordered.  Heel offloading recommended.  Follow-up outpatient podiatry and vascular surgery 1 to 2 weeks.  Right foot cellulitis:  Completed 10 days of broad-spectrum IV antibiotics vancomycin and ceftriaxone Blood cultures remain no growth No wound culture to target antibiotic therapy No evidence of osteomyelitis on MRI right foot ID consulted 8/12 No indication for antibiotics on discharge   Hypotension: Improved   Tobacco use disorder: Counseled to quit smoking cigarettes.    Discharge Instructions   Allergies as of 05/29/2023   No Known Allergies      Medication List     STOP taking these medications    clopidogrel 75 MG tablet Commonly known as: Plavix   HYDROcodone-acetaminophen 5-325 MG tablet Commonly known as: NORCO/VICODIN       TAKE these medications    apixaban 5 MG Tabs tablet Commonly known as: ELIQUIS Take 1 tablet (5 mg total) by mouth 2 (two) times daily.   aspirin EC 81 MG tablet  Take 1 tablet (81 mg total) by mouth daily.   atorvastatin 40 MG tablet Commonly known as: Lipitor Take 1 tablet (40 mg total) by mouth daily. What changed:  medication strength how much to take   gabapentin 300 MG  capsule Commonly known as: NEURONTIN Take 1 capsule (300 mg total) by mouth 2 (two) times daily.   oxyCODONE-acetaminophen 5-325 MG tablet Commonly known as: PERCOCET/ROXICET Take 1-2 tablets by mouth every 4 (four) hours as needed for moderate pain.        Follow-up Information     Rosetta Posner, DPM Follow up in 1 week(s).   Specialty: Podiatry Why: For wound re-check Contact information: [address] [place] [address] [phone]         Georgiana Spinner, NP Follow up in 2 week(s).   Specialty: Vascular Surgery Why: Staple removal rt groin Contact information: [address] [place] [address] [phone]                No Known Allergies  Consultations: Vascular surgery Podiatry Infectious disease   Procedures/Studies: DG C-Arm 1-60 Min-No Report  Result Date: 05/23/2023 Fluoroscopy was utilized by the requesting physician.  No radiographic interpretation.   DG C-Arm 1-60 Min-No Report  Result Date: 05/23/2023 Fluoroscopy was utilized by the requesting physician.  No radiographic interpretation.   PERIPHERAL VASCULAR CATHETERIZATION  Result Date: 05/21/2023 See surgical note for result.  MR FOOT RIGHT W WO CONTRAST  Result Date: 05/18/2023 CLINICAL DATA:  Soft tissue infection suspected. Osteonecrosis suspected. Entire foot pain, swelling, and redness. History of fifth ray amputation. Ulcer in this region. EXAM: MRI OF THE RIGHT FOREFOOT WITHOUT AND WITH CONTRAST TECHNIQUE: Multiplanar, multisequence MR imaging of the right forefoot was performed before and after the administration of intravenous contrast. CONTRAST:  7.8mL GADAVIST GADOBUTROL 1 MMOL/ML IV SOLN COMPARISON:  Right foot radiographs 12/02/2019 and 12/01/2019 FINDINGS: Bones/Joint/Cartilage Postsurgical changes are again seen of amputation of fifth ray to the mid shaft. The amputation margins are sharp. There is a tiny great toe metatarsophalangeal joint  effusion. No marrow edema is seen. No cortical erosion is seen. There is increased T2 signal within the third and fourth toe phalanges on coronal T2 fat saturation images, however this is due to inhomogeneous fat saturation as normal signal is seen in these regions on the sagittal STIR images. Ligaments The Lisfranc ligament complex is intact. The metatarsophalangeal collateral ligaments are intact. Muscles and Tendons There is moderate diffuse edema within the plantar midfoot musculature, nonspecific myositis. No tendon tear is visualized. Soft tissues There is mild-to-moderate lateral ankle and dorsal lateral midfoot MR mild medial ankle and dorsal medial midfoot subcutaneous fat edema and swelling. Reportedly there is a soft tissue ulcer in the region of the prior fifth digit amputation surgery. There is mild fluid and edema seen on axial images 20 through 23 chest dorsal lateral to the fourth metatarsal head. No abscess is seen. No cortical erosion is seen. IMPRESSION: 1. Postsurgical changes of remote amputation of the fifth ray to the mid shaft. Normal bone amputation site. 2. Reportedly there is an ulcer in the region of the prior fifth toe amputation site. No sinus tract, abscess, or osteomyelitis is seen. 3. Mild-to-moderate lateral ankle and dorsal lateral midfoot subcutaneous fat edema and swelling. 4. Moderate diffuse edema within the plantar midfoot musculature, nonspecific myositis. Electronically Signed   By: Neita Garnet M.D.   On: 05/18/2023 21:34   VAS [place] LOWER EXTREMITY ARTERIAL DUPLEX  Result Date: 05/09/2023 LOWER EXTREMITY ARTERIAL DUPLEX STUDY Patient Name:  JORAH GROSSE  Date of Exam:   05/02/2023 Medical Rec #: [id number]         Accession #:    [id number] Date of Birth: 1960-11-29          Patient Gender: M Patient Age:   59 years Exam Location:  [facility] Procedure:      VAS [place] LOWER EXTREMITY ARTERIAL DUPLEX Referring Phys: Festus Barren  --------------------------------------------------------------------------------  Indications: Peripheral artery disease, and Severe pain right leg. High Risk Factors: Current smoker.  Vascular Interventions: 11/14/27: Bilateral CIA and EIA Stents.                          11/28/2019: Aorta, Bilateral CIA, Bilateral EIA and                         Bilateral SFA Stents with Right ATA Angioplasty.                         01/2022 rt thrombectomy and new stents sfa pop. Current ABI:            Not taken today Performing Technologist: Hardie Lora RVT  Examination Guidelines: A complete evaluation includes B-mode imaging, spectral Doppler, color Doppler, and power Doppler as needed of all accessible portions of each vessel. Bilateral testing is considered an integral part of a complete examination. Limited examinations for reoccurring indications may be performed as noted.  +-----------+--------+-----+--------+----------+--------+ RIGHT      PSV cm/sRatioStenosisWaveform  Comments +-----------+--------+-----+--------+----------+--------+ EIA Distal 44                   triphasic          +-----------+--------+-----+--------+----------+--------+ CFA Distal 62                   monophasic         +-----------+--------+-----+--------+----------+--------+ DFA        52                                      +-----------+--------+-----+--------+----------+--------+ SFA Prox   0            occluded          stent    +-----------+--------+-----+--------+----------+--------+ SFA Mid    0            occluded                   +-----------+--------+-----+--------+----------+--------+ SFA Distal 0            occluded          stent    +-----------+--------+-----+--------+----------+--------+ POP Prox   0            occluded                   +-----------+--------+-----+--------+----------+--------+ POP Distal 0            occluded                    +-----------+--------+-----+--------+----------+--------+ ATA Distal 12                   hyperemic          +-----------+--------+-----+--------+----------+--------+ PTA Distal 0  occluded                   +-----------+--------+-----+--------+----------+--------+ PERO Distal8                    hyperemic          +-----------+--------+-----+--------+----------+--------+   Summary: Right: Total occlusion noted in the superficial femoral artery. Total occlusion noted in the superficial femoral artery and/or popliteal artery. Total occlusion noted in the popliteal artery.  See table(s) above for measurements and observations. Electronically signed by Festus Barren MD on 05/09/2023 at 2:08:53 PM.    Final       Subjective: Seen and the day of discharge.  Pain well-controlled.  Appropriate for discharge home.  Discharge Exam: Vitals:   05/29/23 0758 05/29/23 0800  BP: (!) 78/64 (!) 80/62  Pulse:    Resp:    Temp:    SpO2:     Vitals:   05/29/23 0431 05/29/23 0745 05/29/23 0758 05/29/23 0800  BP: 92/73 (!) 78/56 (!) 78/64 (!) 80/62  Pulse: (!) 54 (!) 53    Resp: 18 18    Temp: 97.9 F (36.6 C) 98 F (36.7 C)    TempSrc: Oral Oral    SpO2: 100% 100%    Weight:      Height:        General: Pt is alert, awake, not in acute distress Cardiovascular: RRR, S1/S2 +, no rubs, no gallops Respiratory: CTA bilaterally, no wheezing, no rhonchi Abdominal: Soft, NT, ND, bowel sounds + Extremities: Right foot purple, dusky.  Status post 2 toe amputations    The results of significant diagnostics from this hospitalization (including imaging, microbiology, ancillary and laboratory) are listed below for reference.     Microbiology: No results found for this or any previous visit (from the past 240 hour(s)).   Labs: BNP (last 3 results) No results for input(s): "BNP" in the last 8760 hours. Basic Metabolic Panel: Recent Labs  Lab 05/23/23 0524 05/24/23 0012  05/25/23 0336 05/26/23 0529  NA 136 135  --  139  K 3.4* 3.9  --  3.2*  CL 106 106  --  109  CO2 23 17*  --  23  GLUCOSE 99 211*  --  102*  BUN <5* 7*  --  5*  CREATININE 0.68 0.75 0.82 0.74  CALCIUM 8.5* 8.0*  --  8.2*   Liver Function Tests: No results for input(s): "AST", "ALT", "ALKPHOS", "BILITOT", "PROT", "ALBUMIN" in the last 168 hours. No results for input(s): "LIPASE", "AMYLASE" in the last 168 hours. No results for input(s): "AMMONIA" in the last 168 hours. CBC: Recent Labs  Lab 05/24/23 0012 05/24/23 0352 05/25/23 0336 05/26/23 0529  WBC 12.9* 14.3* 11.6* 9.7  HGB 11.5* 11.2* 11.6* 10.3*  HCT 33.1* 32.4* 35.3* 30.2*  MCV 90.7 90.3 95.4 92.9  PLT 328 325 324 311   Cardiac Enzymes: No results for input(s): "CKTOTAL", "CKMB", "CKMBINDEX", "TROPONINI" in the last 168 hours. BNP: Invalid input(s): "POCBNP" CBG: Recent Labs  Lab 05/23/23 1934 05/24/23 2048  GLUCAP 165* 101*   D-Dimer No results for input(s): "DDIMER" in the last 72 hours. Hgb A1c No results for input(s): "HGBA1C" in the last 72 hours. Lipid Profile No results for input(s): "CHOL", "HDL", "LDLCALC", "TRIG", "CHOLHDL", "LDLDIRECT" in the last 72 hours. Thyroid function studies No results for input(s): "TSH", "T4TOTAL", "T3FREE", "THYROIDAB" in the last 72 hours.  Invalid input(s): "FREET3" Anemia work up No results for input(s): "VITAMINB12", "FOLATE", "  FERRITIN", "TIBC", "IRON", "RETICCTPCT" in the last 72 hours. Urinalysis No results found for: "COLORURINE", "APPEARANCEUR", "LABSPEC", "PHURINE", "GLUCOSEU", "HGBUR", "BILIRUBINUR", "KETONESUR", "PROTEINUR", "UROBILINOGEN", "NITRITE", "LEUKOCYTESUR" Sepsis Labs Recent Labs  Lab 05/24/23 0012 05/24/23 0352 05/25/23 0336 05/26/23 0529  WBC 12.9* 14.3* 11.6* 9.7   Microbiology No results found for this or any previous visit (from the past 240 hour(s)).   Time coordinating discharge: Over 30 minutes  SIGNED:   Tresa Moore,  MD  [facility] 05/29/2023, 1:03 PM Pager   If 7PM-7AM, please contact night-coverage

## 2023-05-29 NOTE — Progress Notes (Addendum)
Progress Note    05/29/2023 8:27 AM 6 Days Post-Op  Subjective:  Brendan Williams is a 62 yo male now POD# 6 from right lower extremity common femoral and superficial endarterectomies with mechanical thrombectomy of the right anterior tibial and dorsalis pedis arteries.  Patient is resting comfortably in bed this morning.  Patient states he has been up and ambulating without putting pressure on the forefoot.  He does state that it is still somewhat painful with some numbness and tingling but it feels better this morning and it is warm.  No complaints overnight.  Vitals are remained stable.   Vitals:   05/29/23 0758 05/29/23 0800  BP: (!) 78/64 (!) 80/62  Pulse:    Resp:    Temp:    SpO2:     Physical Exam: Cardiac:  RRR, Normal S1, S2. No murmurs Lungs:  Scattered Rhonchi on auscultation. Normal respiratory effort.  Incisions:  Right Groin incision with Prevena Wound Vac in place. No hematoma to note. Left Groin from prior angio. With dressing Clean dry and intact. No hematoma to note.  Extremities:  Left lower extremity with palpable pulses and warm to touch. Right lower extremity with strong lower doppler pulses. Warm to touch from mid calf to mid foot.  Abdomen:  Positive bowel sounds throughout, soft, non tender and non distended.  Neurologic: AAOx3 follows commands and answers questions appropriately.   CBC    Component Value Date/Time   WBC 9.7 05/26/2023 0529   RBC 3.25 (L) 05/26/2023 0529   HGB 10.3 (L) 05/26/2023 0529   HCT 30.2 (L) 05/26/2023 0529   PLT 311 05/26/2023 0529   MCV 92.9 05/26/2023 0529   MCH 31.7 05/26/2023 0529   MCHC 34.1 05/26/2023 0529   RDW 13.4 05/26/2023 0529    BMET    Component Value Date/Time   NA 139 05/26/2023 0529   K 3.2 (L) 05/26/2023 0529   CL 109 05/26/2023 0529   CO2 23 05/26/2023 0529   GLUCOSE 102 (H) 05/26/2023 0529   BUN 5 (L) 05/26/2023 0529   CREATININE 0.74 05/26/2023 0529   CALCIUM 8.2 (L) 05/26/2023 0529    GFRNONAA >60 05/26/2023 0529   GFRAA >60 12/04/2019 0523    INR    Component Value Date/Time   INR 1.1 05/18/2023 1253     Intake/Output Summary (Last 24 hours) at 05/29/2023 0827 Last data filed at 05/29/2023 0500 Gross per 24 hour  Intake 720 ml  Output 1100 ml  Net -380 ml     Assessment/Plan:  62 y.o. male is s/p POD#2 from right lower extremity common femoral and superficial endarterectomies with mechanical thrombectomy of the right anterior tibial and dorsalis pedis arteries.  6 Days Post-Op   PLAN: Patient seen by podiatry yesterday.  Recommendations are that the patient's foot continues to need time to demarcate to determine whether or not transmetatarsal amputation would be appropriate or whether the patient will need a below the knee amputation.  It was also recommended that antibiotics at this point in time to go home with after a 10-day course would be prophylactic due to the gangrenous toes.  Podiatry wishes to follow-up with the patient 1 week in the office if the patient were discharged. Vascular surgery is okay with patient discharge at this point time.  Patient's lower extremity remains warm to touch without palpable pulses due to edema.  Patient is noted to have moderate Doppler pulses to his right foot.  Vascular surgery will follow-up with the  patient in clinic in 1 month with ultrasounds.  Patient will need to go home on aspirin 81 mg daily, Eliquis 5 mg twice daily, and Lipitor 40 mg daily.  DVT prophylaxis:  ASA 81 mg daily, Eliquis 5 mg twice daily.    [name] [facility] 05/29/2023 8:27 AM

## 2023-05-29 NOTE — TOC CM/SW Note (Signed)
     [facility] REFERRAL        Occupational Therapy * Physical Therapy * Speech Therapy                           DATE 05/29/2023  PATIENT NAME Brendan Williams    PATIENT MRN [id number]       DIAGNOSIS/DIAGNOSIS CODE I70.221, L03.90, L03.031, L02.611  DATE OF DISCHARGE: 05/29/2023       PRIMARY CARE PHYSICIAN  Unknown Foley, PA   PCP PHONE/FAX Phone: [phone]     Dear Provider (Name: [facility]  Fax: [phone]   I certify that I have examined this patient and that occupational/physical/speech therapy is necessary on an outpatient basis.    The patient has expressed interest in completing their recommended course of therapy at your  location.  Once a formal order from the patient's primary care physician has been obtained, please  contact him/her to schedule an appointment for evaluation at your earliest convenience.   [ x]  Physical Therapy Evaluate and Treat  [ x ]  Occupational Therapy Evaluate and Treat  [  ]  Speech Therapy Evaluate and Treat         The patient's primary care physician (listed above) must furnish and be responsible for a formal order such that the recommended services may be furnished while under the primary physician's care, and that the plan of care will be established and reviewed every 30 days (or more often if condition necessitates).

## 2023-05-29 NOTE — Plan of Care (Signed)
  Problem: Health Behavior/Discharge Planning: Goal: Ability to manage health-related needs will improve Outcome: Progressing   Problem: Clinical Measurements: Goal: Cardiovascular complication will be avoided Outcome: Progressing   Problem: Activity: Goal: Risk for activity intolerance will decrease Outcome: Progressing   Problem: Activity: Goal: Ability to tolerate increased activity will improve Outcome: Progressing

## 2023-05-30 ENCOUNTER — Other Ambulatory Visit: Payer: Self-pay | Admitting: Internal Medicine

## 2023-05-30 DIAGNOSIS — G629 Polyneuropathy, unspecified: Secondary | ICD-10-CM

## 2023-05-30 MED ORDER — GABAPENTIN 300 MG PO CAPS
300.0000 mg | ORAL_CAPSULE | Freq: Two times a day (BID) | ORAL | 1 refills | Status: DC
Start: 2023-05-30 — End: 2023-12-25

## 2023-05-30 MED ORDER — APIXABAN 5 MG PO TABS: 5.0000 mg | ORAL_TABLET | Freq: Two times a day (BID) | ORAL | 1 refills | Status: DC

## 2023-05-30 MED ORDER — OXYCODONE-ACETAMINOPHEN 5-325 MG PO TABS: 1.0000 | ORAL_TABLET | ORAL | 0 refills | Status: DC | PRN

## 2023-05-30 MED ORDER — ATORVASTATIN CALCIUM 40 MG PO TABS: 40.0000 mg | ORAL_TABLET | Freq: Every day | ORAL | 1 refills | Status: DC

## 2023-05-30 NOTE — Progress Notes (Signed)
   New prescriptions sent as original D/C medications had been sent to wrong pharmacy.   Lonia Blood, MD [facility] Office  [phone]

## 2023-06-06 ENCOUNTER — Emergency Department
Admission: EM | Admit: 2023-06-06 | Discharge: 2023-06-06 | Disposition: A | Discharge status: Home / Self Care | Payer: Medicaid Other | Attending: Emergency Medicine | Admitting: Emergency Medicine

## 2023-06-06 ENCOUNTER — Emergency Department: Payer: Medicaid Other

## 2023-06-06 DIAGNOSIS — I96 Gangrene, not elsewhere classified: Secondary | ICD-10-CM | POA: Insufficient documentation

## 2023-06-06 DIAGNOSIS — Z720 Tobacco use: Secondary | ICD-10-CM | POA: Diagnosis not present

## 2023-06-06 DIAGNOSIS — M7989 Other specified soft tissue disorders: Secondary | ICD-10-CM | POA: Diagnosis present

## 2023-06-06 LAB — CBC WITH DIFFERENTIAL/PLATELET
Abs Immature Granulocytes: 0.08 10*3/uL — ABNORMAL HIGH (ref 0.00–0.07)
Basophils Absolute: 0.1 10*3/uL (ref 0.0–0.1)
Basophils Relative: 1 %
Eosinophils Absolute: 0 10*3/uL (ref 0.0–0.5)
Eosinophils Relative: 0 %
HCT: 37.8 % — ABNORMAL LOW (ref 39.0–52.0)
Hemoglobin: 12.9 g/dL — ABNORMAL LOW (ref 13.0–17.0)
Immature Granulocytes: 1 %
Lymphocytes Relative: 14 %
Lymphs Abs: 1.9 10*3/uL (ref 0.7–4.0)
MCH: 31.1 pg (ref 26.0–34.0)
MCHC: 34.1 g/dL (ref 30.0–36.0)
MCV: 91.1 fL (ref 80.0–100.0)
Monocytes Absolute: 0.9 10*3/uL (ref 0.1–1.0)
Monocytes Relative: 7 %
Neutro Abs: 10.6 10*3/uL — ABNORMAL HIGH (ref 1.7–7.7)
Neutrophils Relative %: 77 %
Platelets: 644 10*3/uL — ABNORMAL HIGH (ref 150–400)
RBC: 4.15 MIL/uL — ABNORMAL LOW (ref 4.22–5.81)
RDW: 13.7 % (ref 11.5–15.5)
WBC: 13.5 10*3/uL — ABNORMAL HIGH (ref 4.0–10.5)
nRBC: 0 % (ref 0.0–0.2)

## 2023-06-06 LAB — COMPREHENSIVE METABOLIC PANEL
ALT: 16 U/L (ref 0–44)
AST: 16 U/L (ref 15–41)
Albumin: 3.5 g/dL (ref 3.5–5.0)
Alkaline Phosphatase: 78 U/L (ref 38–126)
Anion gap: 10 (ref 5–15)
BUN: 10 mg/dL (ref 8–23)
CO2: 25 mmol/L (ref 22–32)
Calcium: 9.2 mg/dL (ref 8.9–10.3)
Chloride: 101 mmol/L (ref 98–111)
Creatinine, Ser: 0.86 mg/dL (ref 0.61–1.24)
GFR, Estimated: >60 mL/min (ref >60)
Glucose, Bld: 119 mg/dL — ABNORMAL HIGH (ref 70–99)
Potassium: 4.6 mmol/L (ref 3.5–5.1)
Sodium: 136 mmol/L (ref 135–145)
Total Bilirubin: 0.7 mg/dL (ref 0.3–1.2)
Total Protein: 7.9 g/dL (ref 6.5–8.1)

## 2023-06-06 LAB — LACTIC ACID, PLASMA: Lactic Acid, Venous: 1.6 mmol/L (ref 0.5–1.9)

## 2023-06-06 LAB — PROTIME-INR
INR: 1 (ref 0.8–1.2)
Prothrombin Time: 13.2 seconds (ref 11.4–15.2)

## 2023-06-06 MED ORDER — OXYCODONE-ACETAMINOPHEN 5-325 MG PO TABS: 1.0000 | ORAL_TABLET | ORAL | 0 refills | Status: AC | PRN

## 2023-06-06 NOTE — Discharge Instructions (Addendum)
Follow-up with Dr. Excell Seltzer on Friday as scheduled.  Continue your normal medications as prescribed.  Return to the ER for new, worsening, or persistent severe foot pain, swelling, redness, weeping or drainage, any fever or chills, weakness or numbness to the leg, or any other new or worsening symptoms that concern you.

## 2023-06-06 NOTE — ED Notes (Signed)
Pt wife called and updated, explained podiatry dr just entered room. Wife asking for call back after Dr.'s decide on plan.

## 2023-06-06 NOTE — ED Triage Notes (Signed)
Pt to ED via POV from home. Pt reports has PAD and has had his right pinky toe amputated. Pt reports infection in right foot is getting worse and now has eschar extending over all toes on right foot and up mid foot. Faint pulse palpable in right foot.

## 2023-06-06 NOTE — ED Provider Notes (Signed)
[facility] Provider Note    Event Date/Time   First MD Initiated Contact with Patient 06/06/23 1209     (approximate)   History   No chief complaint on file.   HPI  Brendan Williams is a 62 y.o. male with a history of severe PAD status post stenting, neuropathy, and tobacco use who presents with concern for possible complications related to his foot.  The patient states that he was planned for possible amputation.  He has follow-up later this week with podiatry, however over the last day he has had some nausea.  He noticed that the toes appeared more black and started to be concerned that maybe there could be an infection or other complication with the foot.  He denies any acute pain to the foot.  He has no worsening swelling or any rashes or skin lesions.  He has not noted any drainage.  He denies any fever or chills.  I reviewed past medical records.  The patient was admitted to the hospitalist service earlier this month and discharged on 8/13.  He had a right foot cellulitis and critical right limb ischemia.  He had angiography by vascular surgery on 8/5 and subsequent endarterectomy, angiogram, thrombectomy, angioplasty with stent placement on 8/7.  He was planned for possible amputation by podiatry.   Physical Exam   Triage Vital Signs: ED Triage Vitals  Encounter Vitals Group     BP 06/06/23 1144 109/78     Systolic BP Percentile --      Diastolic BP Percentile --      Pulse Rate 06/06/23 1143 67     Resp 06/06/23 1143 18     Temp 06/06/23 1143 98.1 F (36.7 C)     Temp Source 06/06/23 1143 Oral     SpO2 06/06/23 1143 100 %     Weight --      Height --      Head Circumference --      Peak Flow --      Pain Score 06/06/23 1144 8     Pain Loc --      Pain Education --      Exclude from Growth Chart --     Most recent vital signs: Vitals:   06/06/23 1245 06/06/23 1300  BP: 94/74 (!) 97/55  Pulse: 61 (!) 52  Resp: 18 18  Temp:    SpO2:  100% 95%     General: Alert well-appearing, no distress.  CV:  Good peripheral perfusion.  Resp:  Normal effort.  Abd:  No distention.  Other:  Remaining toes of right foot black in color, dry appearing gangrene.  2+ DP pulse.  No erythema, induration, or abnormal warmth to the intact skin.  No open wounds or ulcers.  No drainage.  Full range of motion at the ankle.  No erythema, induration, or streaking going up the leg.   ED Results / Procedures / Treatments   Labs (all labs ordered are listed, but only abnormal results are displayed) Labs Reviewed  COMPREHENSIVE METABOLIC PANEL - Abnormal; Notable for the following components:      Result Value   Glucose, Bld 119 (*)    All other components within normal limits  CBC WITH DIFFERENTIAL/PLATELET - Abnormal; Notable for the following components:   WBC 13.5 (*)    RBC 4.15 (*)    Hemoglobin 12.9 (*)    HCT 37.8 (*)    Platelets 644 (*)    Neutro  Abs 10.6 (*)    Abs Immature Granulocytes 0.08 (*)    All other components within normal limits  CULTURE, BLOOD (ROUTINE X 2)  CULTURE, BLOOD (ROUTINE X 2)  LACTIC ACID, PLASMA  PROTIME-INR     EKG     RADIOLOGY  XR foot: I independently viewed interpret the images; there is no acute fracture  PROCEDURES:  Critical Care performed: No  Procedures   MEDICATIONS ORDERED IN ED: Medications - No data to display   IMPRESSION / MDM / ASSESSMENT AND PLAN / ED COURSE  I reviewed the triage vital signs and the nursing notes.  62 year old male with PMH as noted above presents with concern for worsening discoloration to his right foot along with some nausea today which is now resolved, but which made him concerned that he could be having an acute complication.  On exam the patient is very well-appearing.  He has a strong DP pulse to the right foot with normal cap refill and the intact skin.  The toes appear gangrenous and are more black and contracted when compared with the  photographs in the chart from 8/12 when he was last evaluated by podiatry.  However, there is no drainage, open wound, or any erythema or induration.  Differential diagnosis includes, but is not limited to, progression of gangrene/ischemia, less likely acute infection.  There is no evidence of osteomyelitis or other infectious complication.  The patient's nausea is resolved and he has no other GI symptoms.  We will obtain basic labs, lactate, x-ray, and reassess.  I will consult podiatry.  At this time, the patient has good circulation to the foot and there is no indication for emergent vascular consultation.  Patient's presentation is most consistent with acute presentation with potential threat to life or bodily function.  ----------------------------------------- 1:45 PM on 06/06/2023 -----------------------------------------  I consulted and discussed case with Dr. Excell Seltzer from podiatry who has been following the patient as well as Dr. Alberteen Spindle who evaluated him today in the ED.  They do not recommend any acute intervention today and do not feel that the patient requires inpatient admission.  He is scheduled for follow-up in 2 days and they will plan to reassess at that time and schedule likely outpatient distal amputation.  Lab workup is overall unremarkable.  There is mild leukocytosis.  Lactate is normal.  Electrolytes are unremarkable.  The patient has no significant pain at this time.  Blood pressure is borderline low, however this appears to be somewhat positional.  The patient has good pulses in his extremities and appears well-perfused overall.  There is no evidence of sepsis or hypovolemia.  I counseled the patient on the results of the workup and the plan of care including podiatry recommendations.  He feels comfortable going home and agrees to follow-up in 2 days as scheduled.  I gave strict return precautions for the ED and he expressed understanding.   FINAL CLINICAL IMPRESSION(S) / ED  DIAGNOSES   Final diagnoses:  Gangrene of right foot (HCC)     Rx / DC Orders   ED Discharge Orders          Ordered    oxyCODONE-acetaminophen (PERCOCET) 5-325 MG tablet  Every 4 hours PRN        06/06/23 1343             Note:  This document was prepared using Dragon voice recognition software and may include unintentional dictation errors.    Dionne Bucy, MD 06/06/23 1347

## 2023-06-06 NOTE — ED Notes (Signed)
Podiatry Dr at bedside

## 2023-06-06 NOTE — ED Notes (Signed)
First Nurse Note: Patient to ED via [facility] from home for post op complications to right foot. Toes on right foot noted to be Brendan Williams and spreading. Dc'd from hospital 1 weeks ago. Pedal pulses felt in foot. VS WNL.

## 2023-06-06 NOTE — Progress Notes (Signed)
   Subjective/Chief Complaint: Patient seen for evaluation of gangrenous changes on his right foot.  Was hospitalized last week and had vascular procedure.  States that overall the foot feels a little bit better actually.  Not having any fever or chills.  Asked to evaluate the patient by Dr. Excell Seltzer.   Objective: Vital signs in last 24 hours: Temp:  [98.1 F (36.7 C)] 98.1 F (36.7 C) (08/21 1143) Pulse Rate:  [52-67] 52 (08/21 1300) Resp:  [18] 18 (08/21 1300) BP: (94-109)/(55-78) 97/55 (08/21 1300) SpO2:  [95 %-100 %] 95 % (08/21 1300)    Intake/Output from previous day: No intake/output data recorded. Intake/Output this shift: No intake/output data recorded.  DP pulses palpable on the right foot.  PT pulses faintly palpable.  Dry gangrenous changes are noted in the digits on the right foot with some scab formation along the lateral and dorsal forefoot.  Appearance of the skin at the level of the rear foot and lower leg has significantly improved and appears much more normalized and stable.     Lab Results:  Recent Labs    06/06/23 1147  WBC 13.5*  HGB 12.9*  HCT 37.8*  PLT 644*   BMET Recent Labs    06/06/23 1147  NA 136  K 4.6  CL 101  CO2 25  GLUCOSE 119*  BUN 10  CREATININE 0.86  CALCIUM 9.2   PT/INR Recent Labs    06/06/23 1147  LABPROT 13.2  INR 1.0   ABG No results for input(s): "PHART", "HCO3" in the last 72 hours.  Invalid input(s): "PCO2", "PO2"  Studies/Results: No results found.  Anti-infectives: Anti-infectives (From admission, onward)    None       Assessment/Plan: s/p * No surgery found * Assessment: Gangrene right forefoot.  Plan: Discussed with the patient that his foot does appear significantly improved as compared to last week.  Pictures taken and did discuss this with Dr. Excell Seltzer.  At this point I do not think that he needs urgent admission to the hospital and we can allow a little bit more time for demarcation and some of  the scab formation to have a chance to slough off to determine proper level of amputation, but I think he may have a chance at healing a transmetatarsal amputation at this point rather than below-knee amputation.  Patient has an appointment with Dr. Excell Seltzer on Friday at which point they can discuss timing of the procedure.  Again do not think that he needs admission at this time.  Follow-up as scheduled.  LOS: 0 days    Ricci Barker 06/06/2023

## 2023-06-08 ENCOUNTER — Other Ambulatory Visit: Payer: Self-pay | Admitting: Podiatry

## 2023-06-11 LAB — CULTURE, BLOOD (ROUTINE X 2)
Culture: NO GROWTH
Culture: NO GROWTH
Special Requests: ADEQUATE
Special Requests: ADEQUATE

## 2023-06-12 ENCOUNTER — Ambulatory Visit (INDEPENDENT_AMBULATORY_CARE_PROVIDER_SITE_OTHER): Payer: Medicaid Other | Admitting: Nurse Practitioner

## 2023-06-12 ENCOUNTER — Encounter: Payer: Self-pay | Admitting: Vascular Surgery

## 2023-06-13 ENCOUNTER — Ambulatory Visit (INDEPENDENT_AMBULATORY_CARE_PROVIDER_SITE_OTHER): Payer: Medicaid Other | Admitting: Nurse Practitioner

## 2023-06-13 ENCOUNTER — Encounter
Admission: RE | Admit: 2023-06-13 | Discharge: 2023-06-13 | Disposition: A | Discharge status: Home / Self Care | Payer: Medicaid Other | Source: Ambulatory Visit | Attending: Podiatry | Admitting: Podiatry

## 2023-06-13 ENCOUNTER — Other Ambulatory Visit: Payer: Self-pay

## 2023-06-13 ENCOUNTER — Encounter (INDEPENDENT_AMBULATORY_CARE_PROVIDER_SITE_OTHER): Payer: Self-pay | Admitting: Nurse Practitioner

## 2023-06-13 VITALS — BP 109/73 | HR 64 | Resp 18 | Ht 73.0 in | Wt 159.6 lb

## 2023-06-13 DIAGNOSIS — I70221 Atherosclerosis of native arteries of extremities with rest pain, right leg: Secondary | ICD-10-CM | POA: Diagnosis not present

## 2023-06-13 DIAGNOSIS — I739 Peripheral vascular disease, unspecified: Secondary | ICD-10-CM

## 2023-06-13 DIAGNOSIS — Z01810 Encounter for preprocedural cardiovascular examination: Secondary | ICD-10-CM | POA: Insufficient documentation

## 2023-06-13 HISTORY — DX: Cellulitis of right toe: L03.031

## 2023-06-13 HISTORY — DX: Atherosclerosis of native arteries of extremities with intermittent claudication, unspecified extremity: I70.219

## 2023-06-13 HISTORY — DX: Cellulitis of right toe: L02.611

## 2023-06-13 HISTORY — DX: Unspecified asthma, uncomplicated: J45.909

## 2023-06-13 HISTORY — DX: Atherosclerosis of native arteries of extremities with rest pain, right leg: I70.221

## 2023-06-13 HISTORY — DX: Anxiety disorder, unspecified: F41.9

## 2023-06-13 HISTORY — DX: Nicotine dependence, unspecified, uncomplicated: F17.200

## 2023-06-13 MED ORDER — OXYCODONE-ACETAMINOPHEN 5-325 MG PO TABS: 1.0000 | ORAL_TABLET | ORAL | 0 refills | Status: DC | PRN

## 2023-06-13 NOTE — Patient Instructions (Addendum)
Your procedure is scheduled on: 06/15/23 - Friday Report to the Registration Desk on the 1st floor of the [facility]. To find out your arrival time, please call [phone] between 1PM - 3PM on: 06/14/23 - Thursday If your arrival time is 6:00 am, do not arrive before that time as the [facility] entrance doors do not open until 6:00 am.  REMEMBER: Instructions that are not followed completely may result in serious medical risk, up to and including death; or upon the discretion of your surgeon and anesthesiologist your surgery may need to be rescheduled.  Do not eat food after midnight the night before surgery.  No gum chewing or hard candies.  You may however, drink CLEAR liquids up to 2 hours before you are scheduled to arrive for your surgery. Do not drink anything within 2 hours of your scheduled arrival time.  Clear liquids include: - water  - apple juice without pulp - gatorade (not RED colors) - black coffee or tea (Do NOT add milk or creamers to the coffee or tea) Do NOT drink anything that is not on this list.   In addition, your doctor has ordered for you to drink the provided:  Ensure Pre-Surgery Clear Carbohydrate Drink  Drinking this carbohydrate drink up to two hours before surgery helps to reduce insulin resistance and improve patient outcomes. Please complete drinking 2 hours before scheduled arrival time.  One week prior to surgery: Stop Anti-inflammatories (NSAIDS) such as Advil, Aleve, Ibuprofen, Motrin, Naproxen, Naprosyn and Aspirin based products such as Excedrin, Goody's Powder, BC Powder. You may however, continue to take Tylenol if needed for pain up until the day of surgery.  Stop ANY OVER THE COUNTER supplements until after surgery.   TAKE ONLY THESE MEDICATIONS THE MORNING OF SURGERY WITH A SIP OF WATER:  gabapentin (NEURONTIN)  HYDROcodone-acetaminophen if needed dexlansoprazole (DEXILANT) ,  take 1 tablet the night before your surgery and 1 on  the morning of.   HOLD PLAVIX beginning 06/10/23 per Dr. Excell Seltzer.  No Alcohol for 24 hours before or after surgery.  No Smoking including e-cigarettes for 24 hours before surgery.  No chewable tobacco products for at least 6 hours before surgery.  No nicotine patches on the day of surgery.  Do not use any "recreational" drugs for at least a week (preferably 2 weeks) before your surgery.  Please be advised that the combination of cocaine and anesthesia may have negative outcomes, up to and including death. If you test positive for cocaine, your surgery will be cancelled.  On the morning of surgery brush your teeth with toothpaste and water, you may rinse your mouth with mouthwash if you wish. Do not swallow any toothpaste or mouthwash.  Use CHG Soap or wipes as directed on instruction sheet.  Do not wear jewelry, make-up, hairpins, clips or nail polish.  Do not wear lotions, powders, or perfumes.   Do not shave body hair from the neck down 48 hours before surgery.  Contact lenses, hearing aids and dentures may not be worn into surgery.  Do not bring valuables to the hospital. [facility] is not responsible for any missing/lost belongings or valuables.   Notify your doctor if there is any change in your medical condition (cold, fever, infection).  Wear comfortable clothing (specific to your surgery type) to the hospital.  After surgery, you can help prevent lung complications by doing breathing exercises.  Take deep breaths and cough every 1-2 hours. Your doctor may order a device  called an Facilities manager to help you take deep breaths. When coughing or sneezing, hold a pillow firmly against your incision with both hands. This is called "splinting." Doing this helps protect your incision. It also decreases belly discomfort.  If you are being admitted to the hospital overnight, leave your suitcase in the car. After surgery it may be brought to your room.  In case of increased  patient census, it may be necessary for you, the patient, to continue your postoperative care in the Same Day Surgery department.  If you are being discharged the day of surgery, you will not be allowed to drive home. You will need a responsible individual to drive you home and stay with you for 24 hours after surgery.   If you are taking public transportation, you will need to have a responsible individual with you.  Please call the Pre-admissions Testing Dept. at [phone] if you have any questions about these instructions.  Surgery Visitation Policy:  Patients having surgery or a procedure may have two visitors.  Children under the age of 44 must have an adult with them who is not the patient.    Preparing for Surgery with CHLORHEXIDINE GLUCONATE (CHG) Soap  Chlorhexidine Gluconate (CHG) Soap  o An antiseptic cleaner that kills germs and bonds with the skin to continue killing germs even after washing  o Used for showering the night before surgery and morning of surgery  Before surgery, you can play an important role by reducing the number of germs on your skin.  CHG (Chlorhexidine gluconate) soap is an antiseptic cleanser which kills germs and bonds with the skin to continue killing germs even after washing.  Please do not use if you have an allergy to CHG or antibacterial soaps. If your skin becomes reddened/irritated stop using the CHG.  1. Shower the NIGHT BEFORE SURGERY and the MORNING OF SURGERY with CHG soap.  2. If you choose to wash your hair, wash your hair first as usual with your normal shampoo.  3. After shampooing, rinse your hair and body thoroughly to remove the shampoo.  4. Use CHG as you would any other liquid soap. You can apply CHG directly to the skin and wash gently with a scrungie or a clean washcloth.  5. Apply the CHG soap to your body only from the neck down. Do not use on open wounds or open sores. Avoid contact with your eyes, ears, mouth, and  genitals (private parts). Wash face and genitals (private parts) with your normal soap.  6. Wash thoroughly, paying special attention to the area where your surgery will be performed.  7. Thoroughly rinse your body with warm water.  8. Do not shower/wash with your normal soap after using and rinsing off the CHG soap.  9. Pat yourself dry with a clean towel.  10. Wear clean pajamas to bed the night before surgery.  12. Place clean sheets on your bed the night of your first shower and do not sleep with pets.  13. Shower again with the CHG soap on the day of surgery prior to arriving at the hospital.  14. Do not apply any deodorants/lotions/powders.  15. Please wear clean clothes to the hospital.  How to Use an Incentive Spirometer  An incentive spirometer is a tool that measures how well you are filling your lungs with each breath. Learning to take long, deep breaths using this tool can help you keep your lungs clear and active. This may help to reverse  or lessen your chance of developing breathing (pulmonary) problems, especially infection. You may be asked to use a spirometer: After a surgery. If you have a lung problem or a history of smoking. After a long period of time when you have been unable to move or be active. If the spirometer includes an indicator to show the highest number that you have reached, your health care provider or respiratory therapist will help you set a goal. Keep a log of your progress as told by your health care provider. What are the risks? Breathing too quickly may cause dizziness or cause you to pass out. Take your time so you do not get dizzy or light-headed. If you are in pain, you may need to take pain medicine before doing incentive spirometry. It is harder to take a deep breath if you are having pain. How to use your incentive spirometer  Sit up on the edge of your bed or on a chair. Hold the incentive spirometer so that it is in an upright  position. Before you use the spirometer, breathe out normally. Place the mouthpiece in your mouth. Make sure your lips are closed tightly around it. Breathe in slowly and as deeply as you can through your mouth, causing the piston or the ball to rise toward the top of the chamber. Hold your breath for 3-5 seconds, or for as long as possible. If the spirometer includes a coach indicator, use this to guide you in breathing. Slow down your breathing if the indicator goes above the marked areas. Remove the mouthpiece from your mouth and breathe out normally. The piston or ball will return to the bottom of the chamber. Rest for a few seconds, then repeat the steps 10 or more times. Take your time and take a few normal breaths between deep breaths so that you do not get dizzy or light-headed. Do this every 1-2 hours when you are awake. If the spirometer includes a goal marker to show the highest number you have reached (best effort), use this as a goal to work toward during each repetition. After each set of 10 deep breaths, cough a few times. This will help to make sure that your lungs are clear. If you have an incision on your chest or abdomen from surgery, place a pillow or a rolled-up towel firmly against the incision when you cough. This can help to reduce pain while taking deep breaths and coughing. General tips When you are able to get out of bed: Walk around often. Continue to take deep breaths and cough in order to clear your lungs. Keep using the incentive spirometer until your health care provider says it is okay to stop using it. If you have been in the hospital, you may be told to keep using the spirometer at home. Contact a health care provider if: You are having difficulty using the spirometer. You have trouble using the spirometer as often as instructed. Your pain medicine is not giving enough relief for you to use the spirometer as told. You have a fever. Get help right away  if: You develop shortness of breath. You develop a cough with bloody mucus from the lungs. You have fluid or blood coming from an incision site after you cough. Summary An incentive spirometer is a tool that can help you learn to take long, deep breaths to keep your lungs clear and active. You may be asked to use a spirometer after a surgery, if you have a lung problem or  a history of smoking, or if you have been inactive for a long period of time. Use your incentive spirometer as instructed every 1-2 hours while you are awake. If you have an incision on your chest or abdomen, place a pillow or a rolled-up towel firmly against your incision when you cough. This will help to reduce pain. Get help right away if you have shortness of breath, you cough up bloody mucus, or blood comes from your incision when you cough. This information is not intended to replace advice given to you by your health care provider. Make sure you discuss any questions you have with your health care provider. Document Revised: 12/22/2019 Document Reviewed: 12/22/2019 Elsevier Patient Education  2023 [employer]    Inpatient Visitation:    Visiting hours are 7 a.m. to 8 p.m. Up to four visitors are allowed at one time in a patient room. The visitors may rotate out with other people during the day.  One visitor age 67 or older may stay with the patient overnight and must be in the room by 8 p.m.

## 2023-06-14 MED ORDER — ORAL CARE MOUTH RINSE: 15.0000 mL | Freq: Once | OROMUCOSAL | Status: DC

## 2023-06-14 MED ORDER — LACTATED RINGERS IV SOLN: INTRAVENOUS | Status: DC

## 2023-06-14 MED ORDER — CEFAZOLIN SODIUM-DEXTROSE 2-4 GM/100ML-% IV SOLN: 2.0000 g | INTRAVENOUS | Status: DC

## 2023-06-14 MED ORDER — CHLORHEXIDINE GLUCONATE 0.12 % MT SOLN: 15.0000 mL | Freq: Once | OROMUCOSAL | Status: DC

## 2023-06-14 NOTE — Progress Notes (Signed)
Subjective:    Patient ID: Brendan Williams, male    DOB: Jul 22, 1961, 62 y.o.   MRN: [id number] Chief Complaint  Patient presents with   Follow-up    n 2 weeks (06/12/2023); Staple removal rt groin    Brendan Williams is a 62 year old male who presents today following intervention of right lower extremity including:  PROCEDURE: 1.   Right common femoral and superficial femoral artery endarterectomies and patch angioplasty 2.   Redo surgery right femoral artery exposure 3.   Right lower extremity angiogram 4.   Mechanical thrombectomy using the penumbra CAT 6 device to the right anterior tibial and dorsalis pedis arteries 5.   Atherectomy of the right SFA and popliteal arteries using the 8 [place] Rex device 6.   Angioplasty of the right anterior tibial artery with 3 mm diameter by 30 cm length angioplasty balloon 7.   Stent placement x 3 to the right SFA and popliteal arteries using 6 mm diameter by 25 cm length, 7 mm diameter by 15 cm length, an 8 mm diameter by 10 cm length Viabahn stents 8.   Separate and distinct right profunda femoris endarterectomy with separate patch angioplasty 9.   Disposable/incisional VAC dressing placement right groin incision Today the incision is intact the proximal portion appears more healed but there is some early dehiscence at the distal portion.  There is some slight drainage in the groin area.  Does not have a foul-smelling odor.  He also notes that he is having some soreness which is to be expected.    Review of Systems  Skin:  Positive for wound.  All other systems reviewed and are negative.      Objective:   Physical Exam Vitals reviewed.  HENT:     Head: Normocephalic.  Cardiovascular:     Rate and Rhythm: Normal rate.  Pulmonary:     Effort: Pulmonary effort is normal.  Musculoskeletal:     Right lower leg: Edema present.  Skin:    General: Skin is warm and dry.  Neurological:     Mental Status: He is alert and oriented to  person, place, and time.  Psychiatric:        Mood and Affect: Mood normal.        Behavior: Behavior normal.        Thought Content: Thought content normal.        Judgment: Judgment normal.     BP 109/73 (BP Location: Left Arm)   Pulse 64   Resp 18   Ht 6\' 1"  (1.854 m)   Wt 159 lb 9.6 oz (72.4 kg)   BMI 21.06 kg/m   Past Medical History:  Diagnosis Date   Anxiety    situational   Asthma    as a child   Atherosclerosis of native arteries of extremity with intermittent claudication (HCC)    Cellulitis and abscess of toe of right foot    Critical limb ischemia of right lower extremity (HCC)    Current smoker    Peripheral arterial disease (HCC)     Social History   Socioeconomic History   Marital status: Married    Spouse name: Vise,BRENDA (Spouse)   Number of children: Not on file   Years of education: Not on file   Highest education level: Not on file  Occupational History   Not on file  Tobacco Use   Smoking status: Former    Current packs/day: 1.00    Types: Cigarettes  Smokeless tobacco: Never  Vaping Use   Vaping status: Never Used  Substance and Sexual Activity   Alcohol use: Yes    Alcohol/week: 5.0 standard drinks of alcohol    Types: 5 Cans of beer per week    Comment: 4-5 beers a week   Drug use: Yes    Frequency: 3.0 times per week    Types: Marijuana    Comment: 3-4 times a week   Sexual activity: Yes  Other Topics Concern   Not on file  Social History Narrative   Live in private residence with wife, Steward Drone. Had grandson and step daughter in the home   Social Determinants of Health   Financial Resource Strain: Not on file  Food Insecurity: No Food Insecurity (05/18/2023)   Hunger Vital Sign    Worried About Running Out of Food in the Last Year: Never true    Ran Out of Food in the Last Year: Never true  Transportation Needs: No Transportation Needs (05/18/2023)   PRAPARE - Administrator, Civil Service (Medical): No     Lack of Transportation (Non-Medical): No  Physical Activity: Not on file  Stress: Not on file  Social Connections: Not on file  Intimate Partner Violence: Patient Unable To Answer (05/18/2023)   Humiliation, Afraid, Rape, and Kick questionnaire    Fear of Current or Ex-Partner: Patient unable to answer    Emotionally Abused: Patient unable to answer    Physically Abused: Patient unable to answer    Sexually Abused: Patient unable to answer    Past Surgical History:  Procedure Laterality Date   AMPUTATION TOE Right 12/02/2019   Procedure: AMPUTATION RIGHT 5TH TOE;  Surgeon: Rosetta Posner, DPM;  Location: [facility];  Service: Podiatry;  Laterality: Right;   APPLICATION OF WOUND VAC Bilateral 11/28/2019   Procedure: APPLICATION OF WOUND VAC;  Surgeon: Annice Needy, MD;  Location: [facility];  Service: Vascular;  Laterality: Bilateral;  Prevena    BACK SURGERY     lower ruptured disk   CENTRAL VENOUS CATHETER INSERTION  11/28/2019   Procedure: INSERTION CENTRAL LINE ADULT;  Surgeon: Annice Needy, MD;  Location: [facility];  Service: Vascular;;   ENDARTERECTOMY FEMORAL Bilateral 11/28/2019   Procedure: ENDARTERECTOMY FEMORAL;  Surgeon: Annice Needy, MD;  Location: [facility];  Service: Vascular;  Laterality: Bilateral;   ENDARTERECTOMY FEMORAL Right 05/23/2023   Procedure: ENDARTERECTOMY FEMORAL REDO;  Surgeon: Annice Needy, MD;  Location: [facility];  Service: Vascular;  Laterality: Right;   ENDOVASCULAR REPAIR/STENT GRAFT Right 05/23/2023   Procedure: ENDOVASCULAR REPAIR/STENT GRAFT;  Surgeon: Annice Needy, MD;  Location: [facility];  Service: Vascular;  Laterality: Right;  Right SFA Stents   LOWER EXTREMITY ANGIOGRAPHY Right 11/14/2019   Procedure: LOWER EXTREMITY ANGIOGRAPHY;  Surgeon: Annice Needy, MD;  Location: [facility];  Service: Cardiovascular;  Laterality: Right;   LOWER EXTREMITY ANGIOGRAPHY Right 12/19/2021   Procedure: Lower Extremity Angiography;  Surgeon: Annice Needy, MD;   Location: [facility];  Service: Cardiovascular;  Laterality: Right;   LOWER EXTREMITY ANGIOGRAPHY Right 01/30/2022   Procedure: Lower Extremity Angiography;  Surgeon: Annice Needy, MD;  Location: [facility];  Service: Cardiovascular;  Laterality: Right;   LOWER EXTREMITY ANGIOGRAPHY Right 05/21/2023   Procedure: Lower Extremity Angiography;  Surgeon: Annice Needy, MD;  Location: [facility];  Service: Cardiovascular;  Laterality: Right;   MECHANICAL THROMBECTOMY WITH AORTOGRAM AND INTERVENTION  05/23/2023  Procedure: MECHANICAL THROMBECTOMY right popliteal artery;  Surgeon: Annice Needy, MD;  Location: [facility];  Service: Vascular;;   STOMACH SURGERY     exploratory   TONSILECTOMY, ADENOIDECTOMY, BILATERAL MYRINGOTOMY AND TUBES     TONSILLECTOMY      Family History  Problem Relation Age of Onset   Leukemia Mother    Heart disease Father        a. CABG   Heart disease Sister        a. MVR   Cancer Sister    Cancer Brother    CAD Brother        a. CABG    No Known Allergies     Latest Ref Rng & Units 06/06/2023   11:47 AM 05/26/2023    5:29 AM 05/25/2023    3:36 AM  CBC  WBC 4.0 - 10.5 K/uL 13.5  9.7  11.6   Hemoglobin 13.0 - 17.0 g/dL 16.1  09.6  04.5   Hematocrit 39.0 - 52.0 % 37.8  30.2  35.3   Platelets 150 - 400 K/uL 644  311  324       CMP     Component Value Date/Time   NA 136 06/06/2023 1147   K 4.6 06/06/2023 1147   CL 101 06/06/2023 1147   CO2 25 06/06/2023 1147   GLUCOSE 119 (H) 06/06/2023 1147   BUN 10 06/06/2023 1147   CREATININE 0.86 06/06/2023 1147   CALCIUM 9.2 06/06/2023 1147   PROT 7.9 06/06/2023 1147   ALBUMIN 3.5 06/06/2023 1147   AST 16 06/06/2023 1147   ALT 16 06/06/2023 1147   ALKPHOS 78 06/06/2023 1147   BILITOT 0.7 06/06/2023 1147   GFRNONAA >60 06/06/2023 1147     No results found.     Assessment & Plan:   1. Critical limb ischemia of right lower extremity (HCC) Today only a portion at the proximal  end of his incision where the staples removed.  The distal portion appears to need some additional time.  We will plan on having the patient return in 1 week in order to remove further staples.  There are some areas that were concerning for possible infection so we cultured the areas as well.   Current Outpatient Medications on File Prior to Visit  Medication Sig Dispense Refill   aspirin EC 81 MG tablet Take 1 tablet (81 mg total) by mouth daily. 150 tablet 2   atorvastatin (LIPITOR) 40 MG tablet Take 1 tablet (40 mg total) by mouth daily. 30 tablet 1   dexlansoprazole (DEXILANT) 60 MG capsule Take 60 mg by mouth as needed.     gabapentin (NEURONTIN) 300 MG capsule Take 1 capsule (300 mg total) by mouth 2 (two) times daily. 60 capsule 1   ondansetron (ZOFRAN-ODT) 4 MG disintegrating tablet Take 4 mg by mouth once.     apixaban (ELIQUIS) 5 MG TABS tablet Take 1 tablet (5 mg total) by mouth 2 (two) times daily. (Patient not taking: Reported on 06/13/2023) 60 tablet 1   No current facility-administered medications on file prior to visit.    There are no Patient Instructions on file for this visit. No follow-ups on file.   Georgiana Spinner, NP

## 2023-06-15 ENCOUNTER — Ambulatory Visit: Admit: 2023-06-15 | Payer: Medicaid Other | Admitting: Podiatry

## 2023-06-15 ENCOUNTER — Encounter: Payer: Self-pay | Admitting: Certified Registered"

## 2023-06-15 ENCOUNTER — Ambulatory Visit
Admission: RE | Admit: 2023-06-15 | Discharge: 2023-06-15 | Disposition: A | Discharge status: Home / Self Care | Payer: Medicaid Other | Source: Ambulatory Visit | Attending: Podiatry | Admitting: Podiatry

## 2023-06-15 ENCOUNTER — Encounter: Payer: Self-pay | Admitting: Podiatry

## 2023-06-15 ENCOUNTER — Other Ambulatory Visit: Payer: Self-pay

## 2023-06-15 ENCOUNTER — Encounter
Admission: RE | Disposition: A | Discharge status: Home / Self Care | Payer: Self-pay | Source: Ambulatory Visit | Attending: Podiatry

## 2023-06-15 DIAGNOSIS — I96 Gangrene, not elsewhere classified: Secondary | ICD-10-CM | POA: Insufficient documentation

## 2023-06-15 DIAGNOSIS — Z5309 Procedure and treatment not carried out because of other contraindication: Secondary | ICD-10-CM | POA: Insufficient documentation

## 2023-06-15 SURGERY — AMPUTATION, FOOT, TRANSMETATARSAL
Anesthesia: Choice | Site: Toe | Laterality: Right

## 2023-06-15 MED ORDER — CEFAZOLIN SODIUM-DEXTROSE 2-4 GM/100ML-% IV SOLN
INTRAVENOUS | Status: AC
  Filled 2023-06-15: qty 100

## 2023-06-15 MED ORDER — FAMOTIDINE 20 MG PO TABS
ORAL_TABLET | ORAL | Status: AC
  Filled 2023-06-15: qty 1

## 2023-06-15 MED ORDER — CHLORHEXIDINE GLUCONATE 0.12 % MT SOLN
OROMUCOSAL | Status: AC
  Filled 2023-06-15: qty 15

## 2023-06-15 SURGICAL SUPPLY — 63 items
BAG COUNTER SPONGE SURGICOUNT (BAG) IMPLANT
BAG SPNG CNTER NS LX DISP (BAG)
BLADE OSC/SAGITTAL MD 9X18.5 (BLADE) ×1 IMPLANT
BLADE OSCILLATING/SAGITTAL (BLADE) ×1
BLADE SURG 15 STRL LF DISP TIS (BLADE) ×2 IMPLANT
BLADE SURG 15 STRL SS (BLADE) ×2
BLADE SW THK.38XMED LNG THN (BLADE) ×1 IMPLANT
BNDG CMPR 5X4 CHSV STRCH STRL (GAUZE/BANDAGES/DRESSINGS) ×1
BNDG CMPR 5X4 KNIT ELC UNQ LF (GAUZE/BANDAGES/DRESSINGS) ×1
BNDG CMPR 6 X 5 YARDS HK CLSR (GAUZE/BANDAGES/DRESSINGS) ×1
BNDG CMPR 75X41 PLY HI ABS (GAUZE/BANDAGES/DRESSINGS) ×1
BNDG COHESIVE 4X5 TAN STRL LF (GAUZE/BANDAGES/DRESSINGS) ×1 IMPLANT
BNDG ELASTIC 4INX 5YD STR LF (GAUZE/BANDAGES/DRESSINGS) ×1 IMPLANT
BNDG ELASTIC 6INX 5YD STR LF (GAUZE/BANDAGES/DRESSINGS) ×1 IMPLANT
BNDG ESMARCH 4 X 12 STRL LF (GAUZE/BANDAGES/DRESSINGS) ×1
BNDG ESMARCH 4X12 STRL LF (GAUZE/BANDAGES/DRESSINGS) ×1 IMPLANT
BNDG GAUZE DERMACEA FLUFF 4 (GAUZE/BANDAGES/DRESSINGS) ×2 IMPLANT
BNDG GZE DERMACEA 4 6PLY (GAUZE/BANDAGES/DRESSINGS) ×2
BNDG STRETCH 4X75 STRL LF (GAUZE/BANDAGES/DRESSINGS) ×1 IMPLANT
CUFF TOURN SGL QUICK 12 (TOURNIQUET CUFF) ×1 IMPLANT
CUFF TOURN SGL QUICK 18X4 (TOURNIQUET CUFF) ×1 IMPLANT
DRAIN PENROSE 12X.25 LTX STRL (MISCELLANEOUS) IMPLANT
DRAPE FLUOR MINI C-ARM 54X84 (DRAPES) ×1 IMPLANT
DRSG MEPILEX FLEX 3X3 (GAUZE/BANDAGES/DRESSINGS) IMPLANT
DURAPREP 26ML APPLICATOR (WOUND CARE) ×1 IMPLANT
ELECT REM PT RETURN 9FT ADLT (ELECTROSURGICAL) ×1
ELECTRODE REM PT RTRN 9FT ADLT (ELECTROSURGICAL) ×1 IMPLANT
GAUZE SPONGE 4X4 12PLY STRL (GAUZE/BANDAGES/DRESSINGS) ×1 IMPLANT
GAUZE XEROFORM 1X8 LF (GAUZE/BANDAGES/DRESSINGS) ×2 IMPLANT
GLOVE BIO SURGEON STRL SZ7 (GLOVE) ×1 IMPLANT
GLOVE INDICATOR 7.0 STRL GRN (GLOVE) ×1 IMPLANT
GLOVE INDICATOR 7.5 STRL GRN (GLOVE) ×1 IMPLANT
GOWN STRL REUS W/ TWL LRG LVL3 (GOWN DISPOSABLE) ×2 IMPLANT
GOWN STRL REUS W/TWL LRG LVL3 (GOWN DISPOSABLE) ×2
HANDLE YANKAUER SUCT BULB TIP (MISCELLANEOUS) IMPLANT
HANDPIECE VERSAJET DEBRIDEMENT (MISCELLANEOUS) IMPLANT
KIT TURNOVER KIT A (KITS) ×1 IMPLANT
MANIFOLD NEPTUNE II (INSTRUMENTS) ×1 IMPLANT
NDL SAFETY ECLIP 18X1.5 (MISCELLANEOUS) ×1 IMPLANT
NEEDLE HYPO 25X1 1.5 SAFETY (NEEDLE) ×2 IMPLANT
NS IRRIG 500ML POUR BTL (IV SOLUTION) ×1 IMPLANT
PACK EXTREMITY ARMC (MISCELLANEOUS) ×1 IMPLANT
PULSAVAC PLUS IRRIG FAN TIP (DISPOSABLE)
SOL PREP PVP 2OZ (MISCELLANEOUS) ×1
SOLUTION PREP PVP 2OZ (MISCELLANEOUS) ×1 IMPLANT
SPONGE T-LAP 18X18 ~~LOC~~+RFID (SPONGE) ×1 IMPLANT
STAPLER SKIN PROX 35W (STAPLE) ×1 IMPLANT
STOCKINETTE M/LG 89821 (MISCELLANEOUS) ×1 IMPLANT
STRIP CLOSURE SKIN 1/4X4 (GAUZE/BANDAGES/DRESSINGS) ×1 IMPLANT
SUT ETHILON 2 0 FS 18 (SUTURE) ×2 IMPLANT
SUT ETHILON 2 0 FSLX (SUTURE) ×1 IMPLANT
SUT ETHILON 3-0 FS-10 30 BLK (SUTURE) ×2
SUT VIC AB 2-0 CT1 27 (SUTURE) ×1
SUT VIC AB 2-0 CT1 TAPERPNT 27 (SUTURE) ×1 IMPLANT
SUT VIC AB 2-0 SH 27 (SUTURE) ×1
SUT VIC AB 2-0 SH 27XBRD (SUTURE) ×1 IMPLANT
SUT VIC AB 3-0 SH 27 (SUTURE) ×2
SUT VIC AB 3-0 SH 27X BRD (SUTURE) ×2 IMPLANT
SUTURE EHLN 3-0 FS-10 30 BLK (SUTURE) ×2 IMPLANT
SYR 10ML LL (SYRINGE) ×2 IMPLANT
TIP FAN IRRIG PULSAVAC PLUS (DISPOSABLE) IMPLANT
TRAP FLUID SMOKE EVACUATOR (MISCELLANEOUS) ×1 IMPLANT
WATER STERILE IRR 500ML POUR (IV SOLUTION) ×1 IMPLANT

## 2023-06-15 NOTE — Progress Notes (Signed)
Surgeon informed by RN that the patient took his eliquis twice yesterday as prescribed. Surgery to be reschedule per surgeon. Patient notified.

## 2023-06-15 NOTE — Progress Notes (Signed)
Case cancelled today.  Patient unsure but believes he has taken eliquis within the past 24 hours as well as plavix despite instructions.  Will have to reschedule.  Patient to stop eliquis 48 hours before surgery and plavix 5 days before surgery.  Will plan for surgery next Thursday most likely but will have our office reach out to them today to reschedule.  Patient instructed on care for wound with betadine paint on toes and between toes and to keep foot dry.  If worsening he should go to the ED for admission to have it done sooner but today it appears very stable with no SOI.

## 2023-06-15 NOTE — Discharge Instructions (Signed)
[facility] SUR[facility]ERY [facility]ENTER  POST OPERATIVE INSTRUCTIONS FOR DR. Ether Griffins AND DR. Zoran Yankee [facility] CLINIC PODIATRY DEPARTMENT   Take your medication as prescribed.  Pain medication should be taken only as needed.  Keep the dressing clean, dry and intact.  Keep your foot elevated above the heart level for the first 48 hours.  Continue elevation thereafter to improve swelling.  Remain nonweightbearing at all times to the right lower extremity.  Do not take a shower. Baths are permissible as long as the foot is kept out of the water.   Every hour you are awake:  Bend your knee 15 times. Flex foot 15 times Massage calf 15 times  Call [facility] [phone]) if any of the following problems occur: You develop a temperature or fever. The bandage becomes saturated with blood. Medication does not stop your pain. Injury of the foot occurs. Any symptoms of infection including redness, odor, or red streaks running from wound.

## 2023-06-19 ENCOUNTER — Encounter: Payer: Self-pay | Admitting: Podiatry

## 2023-06-19 ENCOUNTER — Other Ambulatory Visit: Payer: Self-pay | Admitting: Podiatry

## 2023-06-20 ENCOUNTER — Ambulatory Visit (INDEPENDENT_AMBULATORY_CARE_PROVIDER_SITE_OTHER): Payer: Medicaid Other | Admitting: Nurse Practitioner

## 2023-06-20 ENCOUNTER — Encounter (INDEPENDENT_AMBULATORY_CARE_PROVIDER_SITE_OTHER): Payer: Self-pay | Admitting: Nurse Practitioner

## 2023-06-20 VITALS — BP 109/69 | HR 52 | Resp 18 | Ht 73.0 in | Wt 175.0 lb

## 2023-06-20 DIAGNOSIS — I70221 Atherosclerosis of native arteries of extremities with rest pain, right leg: Secondary | ICD-10-CM

## 2023-06-20 MED ORDER — MEDIHONEY WOUND/BURN DRESSING EX PSTE: 1.0000 | PASTE | Freq: Every day | CUTANEOUS | 1 refills | Status: DC

## 2023-06-20 MED ORDER — SULFAMETHOXAZOLE-TRIMETHOPRIM 800-160 MG PO TABS: 1.0000 | ORAL_TABLET | Freq: Two times a day (BID) | ORAL | 0 refills | Status: DC

## 2023-06-20 NOTE — Discharge Instructions (Signed)
[facility] SURGERY CENTER  POST OPERATIVE INSTRUCTIONS FOR DR. Ether Griffins AND DR. Marsella Suman [facility] CLINIC PODIATRY DEPARTMENT   Take your medication as prescribed.  Pain medication should be taken only as needed.  May additionally take Tylenol as needed, maximum dose is 4000 mg a day.  If pain is still uncontrolled you may take ibuprofen as well, maximum dose is 3200 mg a day.  Keep the dressing clean, dry and intact.  Remain nonweightbearing at all times to the right lower extremity.  He like to remain nonweightbearing for at least the next 2 to 3 weeks.  Keep your foot elevated above the heart level for the first 48 hours.  Continue elevation thereafter to improve swelling.  Walking to the bathroom and brief periods of walking are acceptable, unless we have instructed you to be non-weight bearing.  Always wear your post-op shoe when walking.  Always use your crutches if you are to be non-weight bearing.  Do not take a shower. Baths are permissible as long as the foot is kept out of the water.   Every hour you are awake:  Bend your knee 15 times. Massage calf 15 times  Call [facility] [phone]) if any of the following problems occur: You develop a temperature or fever. The bandage becomes saturated with blood. Medication does not stop your pain. Injury of the foot occurs. Any symptoms of infection including redness, odor, or red streaks running from wound.

## 2023-06-21 ENCOUNTER — Other Ambulatory Visit: Payer: Self-pay

## 2023-06-21 ENCOUNTER — Ambulatory Visit: Payer: Medicaid Other | Admitting: Anesthesiology

## 2023-06-21 ENCOUNTER — Encounter
Admission: RE | Disposition: A | Discharge status: Home / Self Care | Payer: Self-pay | Source: Ambulatory Visit | Attending: Podiatry

## 2023-06-21 ENCOUNTER — Ambulatory Visit
Admission: RE | Admit: 2023-06-21 | Discharge: 2023-06-21 | Disposition: A | Discharge status: Home / Self Care | Payer: Medicaid Other | Source: Ambulatory Visit | Attending: Podiatry | Admitting: Podiatry

## 2023-06-21 ENCOUNTER — Encounter: Payer: Self-pay | Admitting: Podiatry

## 2023-06-21 ENCOUNTER — Ambulatory Visit: Payer: Self-pay | Admitting: Anesthesiology

## 2023-06-21 DIAGNOSIS — Z89421 Acquired absence of other right toe(s): Secondary | ICD-10-CM | POA: Diagnosis not present

## 2023-06-21 DIAGNOSIS — Z87891 Personal history of nicotine dependence: Secondary | ICD-10-CM | POA: Insufficient documentation

## 2023-06-21 DIAGNOSIS — I96 Gangrene, not elsewhere classified: Secondary | ICD-10-CM | POA: Insufficient documentation

## 2023-06-21 DIAGNOSIS — I739 Peripheral vascular disease, unspecified: Secondary | ICD-10-CM | POA: Insufficient documentation

## 2023-06-21 DIAGNOSIS — I70221 Atherosclerosis of native arteries of extremities with rest pain, right leg: Secondary | ICD-10-CM

## 2023-06-21 DIAGNOSIS — I70261 Atherosclerosis of native arteries of extremities with gangrene, right leg: Secondary | ICD-10-CM | POA: Diagnosis not present

## 2023-06-21 HISTORY — PX: TRANSMETATARSAL AMPUTATION: SHX6197

## 2023-06-21 SURGERY — AMPUTATION, FOOT, TRANSMETATARSAL
Anesthesia: General | Site: Toe | Laterality: Right

## 2023-06-21 MED ORDER — AMOXICILLIN-POT CLAVULANATE 875-125 MG PO TABS: 1.0000 | ORAL_TABLET | Freq: Two times a day (BID) | ORAL | 0 refills | Status: DC

## 2023-06-21 MED ORDER — 0.9 % SODIUM CHLORIDE (POUR BTL) OPTIME
TOPICAL | Status: DC | PRN
  Administered 2023-06-21: 1000 mL

## 2023-06-21 MED ORDER — DEXMEDETOMIDINE HCL IN NACL 400 MCG/100ML IV SOLN
INTRAVENOUS | Status: DC | PRN
Start: 2023-06-21 — End: 2023-06-21
  Administered 2023-06-21 (×2): 4 ug via INTRAVENOUS

## 2023-06-21 MED ORDER — PROPOFOL 10 MG/ML IV BOLUS
INTRAVENOUS | Status: DC | PRN
  Administered 2023-06-21: 140 mg via INTRAVENOUS

## 2023-06-21 MED ORDER — APIXABAN 5 MG PO TABS: 5.0000 mg | ORAL_TABLET | Freq: Two times a day (BID) | ORAL | Status: DC

## 2023-06-21 MED ORDER — GLYCOPYRROLATE 0.2 MG/ML IJ SOLN
INTRAMUSCULAR | Status: DC | PRN
  Administered 2023-06-21: .2 mg via INTRAVENOUS

## 2023-06-21 MED ORDER — BUPIVACAINE LIPOSOME 1.3 % IJ SUSP
INTRAMUSCULAR | Status: DC | PRN
Start: 2023-06-21 — End: 2023-06-21
  Administered 2023-06-21 (×2): 10 mL

## 2023-06-21 MED ORDER — OXYCODONE-ACETAMINOPHEN 7.5-325 MG PO TABS
1.0000 | ORAL_TABLET | ORAL | 0 refills | Status: DC | PRN
Start: 2023-06-21 — End: 2023-09-28

## 2023-06-21 MED ORDER — MIDAZOLAM HCL 2 MG/2ML IJ SOLN
2.0000 mg | INTRAMUSCULAR | Status: AC | PRN
  Administered 2023-06-21: 2 mg via INTRAVENOUS

## 2023-06-21 MED ORDER — FENTANYL CITRATE PF 50 MCG/ML IJ SOSY
50.0000 ug | PREFILLED_SYRINGE | INTRAMUSCULAR | Status: DC | PRN
  Administered 2023-06-21: 50 ug via INTRAVENOUS

## 2023-06-21 MED ORDER — OXYCODONE HCL 5 MG PO TABS
10.0000 mg | ORAL_TABLET | Freq: Once | ORAL | Status: AC
  Administered 2023-06-21: 10 mg via ORAL

## 2023-06-21 MED ORDER — CLOPIDOGREL BISULFATE 75 MG PO TABS: 75.0000 mg | ORAL_TABLET | Freq: Every day | ORAL | Status: DC

## 2023-06-21 MED ORDER — ONDANSETRON HCL 4 MG PO TABS: 4.0000 mg | ORAL_TABLET | Freq: Three times a day (TID) | ORAL | 0 refills | Status: AC | PRN

## 2023-06-21 MED ORDER — FENTANYL CITRATE (PF) 100 MCG/2ML IJ SOLN
INTRAMUSCULAR | Status: DC | PRN
  Administered 2023-06-21: 25 ug via INTRAVENOUS
  Administered 2023-06-21: 50 ug via INTRAVENOUS
  Administered 2023-06-21: 25 ug via INTRAVENOUS

## 2023-06-21 MED ORDER — BUPIVACAINE HCL (PF) 0.5 % IJ SOLN
INTRAMUSCULAR | Status: DC | PRN
Start: 2023-06-21 — End: 2023-06-21
  Administered 2023-06-21 (×2): 10 mL

## 2023-06-21 MED ORDER — DEXAMETHASONE SODIUM PHOSPHATE 4 MG/ML IJ SOLN
INTRAMUSCULAR | Status: DC | PRN
  Administered 2023-06-21: 4 mg via INTRAVENOUS

## 2023-06-21 MED ORDER — ACETAMINOPHEN 500 MG PO TABS
1000.0000 mg | ORAL_TABLET | Freq: Once | ORAL | Status: AC
  Administered 2023-06-21: 1000 mg via ORAL

## 2023-06-21 MED ORDER — EPHEDRINE SULFATE (PRESSORS) 50 MG/ML IJ SOLN
INTRAMUSCULAR | Status: DC | PRN
Start: 2023-06-21 — End: 2023-06-21
  Administered 2023-06-21: 10 mg via INTRAVENOUS
  Administered 2023-06-21 (×4): 5 mg via INTRAVENOUS

## 2023-06-21 MED ORDER — CEFAZOLIN SODIUM-DEXTROSE 2-4 GM/100ML-% IV SOLN
2.0000 g | INTRAVENOUS | Status: AC
  Administered 2023-06-21: 2 g via INTRAVENOUS

## 2023-06-21 MED ORDER — LIDOCAINE HCL (CARDIAC) PF 100 MG/5ML IV SOSY
PREFILLED_SYRINGE | INTRAVENOUS | Status: DC | PRN
  Administered 2023-06-21: 100 mg via INTRATRACHEAL

## 2023-06-21 MED ORDER — LACTATED RINGERS IV SOLN: INTRAVENOUS | Status: DC

## 2023-06-21 MED ORDER — ASPIRIN EC 81 MG PO TBEC: 81.0000 mg | DELAYED_RELEASE_TABLET | Freq: Every day | ORAL | Status: DC

## 2023-06-21 MED ORDER — ONDANSETRON HCL 4 MG/2ML IJ SOLN
INTRAMUSCULAR | Status: DC | PRN
  Administered 2023-06-21: 4 mg via INTRAVENOUS

## 2023-06-21 SURGICAL SUPPLY — 31 items
BLADE MED AGGRESSIVE (BLADE) IMPLANT
BLADE SURG 15 STRL LF DISP TIS (BLADE) IMPLANT
BLADE SURG 15 STRL SS (BLADE) ×1
BNDG CMPR STD VLCR NS LF 5.8X4 (GAUZE/BANDAGES/DRESSINGS) ×1
BNDG ELASTIC 4X5.8 VLCR NS LF (GAUZE/BANDAGES/DRESSINGS) IMPLANT
BNDG GAUZE DERMACEA FLUFF 4 (GAUZE/BANDAGES/DRESSINGS) ×1 IMPLANT
BNDG GZE DERMACEA 4 6PLY (GAUZE/BANDAGES/DRESSINGS) ×1
COVER LIGHT HANDLE UNIVERSAL (MISCELLANEOUS) IMPLANT
ELECT REM PT RETURN 9FT ADLT (ELECTROSURGICAL) ×1 IMPLANT
ELECTRODE REM PT RTRN 9FT ADLT (ELECTROSURGICAL) ×1 IMPLANT
GAUZE SPONGE 4X4 12PLY STRL (GAUZE/BANDAGES/DRESSINGS) ×1 IMPLANT
GAUZE XEROFORM 1X8 LF (GAUZE/BANDAGES/DRESSINGS) ×1 IMPLANT
GLOVE BIOGEL PI IND STRL 7.5 (GLOVE) ×1 IMPLANT
GLOVE SURG SS PI 7.0 STRL IVOR (GLOVE) ×1 IMPLANT
GOWN STRL REUS W/ TWL LRG LVL3 (GOWN DISPOSABLE) ×2 IMPLANT
GOWN STRL REUS W/TWL LRG LVL3 (GOWN DISPOSABLE) ×2
KIT TURNOVER KIT A (KITS) ×1 IMPLANT
NDL HYPO 25GX1X1/2 BEV (NEEDLE) ×1 IMPLANT
NEEDLE HYPO 25GX1X1/2 BEV (NEEDLE) ×1 IMPLANT
NS IRRIG 500ML POUR BTL (IV SOLUTION) ×1 IMPLANT
PACK EXTREMITY (MISCELLANEOUS) ×1 IMPLANT
PAD ABD DERMACEA PRESS 5X9 (GAUZE/BANDAGES/DRESSINGS) IMPLANT
SHOE POST OP SQ TOE LG (MISCELLANEOUS) IMPLANT
SOL PREP PVP 2OZ (MISCELLANEOUS) ×1 IMPLANT
SOLUTION PREP PVP 2OZ (MISCELLANEOUS) ×1 IMPLANT
STAPLER SKIN PROX 35W (STAPLE) IMPLANT
STOCKINETTE STRL 6IN 960660 (GAUZE/BANDAGES/DRESSINGS) ×1 IMPLANT
SUT ETHILON 3-0 FS-10 30 BLK (SUTURE) ×3 IMPLANT
SUT VIC AB 3-0 SH 27 (SUTURE) ×2
SUT VIC AB 3-0 SH 27X BRD (SUTURE) IMPLANT
SUTURE EHLN 3-0 FS-10 30 BLK (SUTURE) IMPLANT

## 2023-06-21 NOTE — Progress Notes (Signed)
Subjective:    Patient ID: Brendan Williams, male    DOB: 11/16/1960, 62 y.o.   MRN: [id number] Chief Complaint  Patient presents with   Follow-up    Staple removal    The patient presents today for wound evaluation and staple removal.  Today the wound has some small areas of dehiscence.  He notes that he will be going for his trans metatarsal amputation tomorrow.    Review of Systems  Skin:  Positive for wound.  All other systems reviewed and are negative.      Objective:   Physical Exam Vitals reviewed.  HENT:     Head: Normocephalic.  Cardiovascular:     Rate and Rhythm: Normal rate.     Pulses: Normal pulses.  Pulmonary:     Effort: Pulmonary effort is normal.  Skin:    General: Skin is warm and dry.  Neurological:     Mental Status: He is alert and oriented to person, place, and time.  Psychiatric:        Mood and Affect: Mood normal.        Behavior: Behavior normal.        Thought Content: Thought content normal.        Judgment: Judgment normal.     BP 109/69 (BP Location: Left Arm)   Pulse (!) 52   Resp 18   Ht 6\' 1"  (1.854 m)   Wt 175 lb (79.4 kg)   BMI 23.09 kg/m   Past Medical History:  Diagnosis Date   Anxiety    situational   Asthma    as a child   Atherosclerosis of native arteries of extremity with intermittent claudication (HCC)    Cellulitis and abscess of toe of right foot    Critical limb ischemia of right lower extremity (HCC)    Current smoker    Quit 04/16/23   Peripheral arterial disease (HCC)     Social History   Socioeconomic History   Marital status: Married    Spouse name: Ayoub,BRENDA (Spouse)   Number of children: Not on file   Years of education: Not on file   Highest education level: Not on file  Occupational History   Not on file  Tobacco Use   Smoking status: Former    Current packs/day: 0.00    Average packs/day: 1 pack/day for 40.5 years (40.5 ttl pk-yrs)    Types: Cigarettes    Start date: 46     Quit date: 04/16/2023    Years since quitting: 0.1   Smokeless tobacco: Never  Vaping Use   Vaping status: Never Used  Substance and Sexual Activity   Alcohol use: Yes    Alcohol/week: 5.0 standard drinks of alcohol    Types: 5 Cans of beer per week    Comment: 4-5 beers a week   Drug use: Yes    Frequency: 3.0 times per week    Types: Marijuana    Comment: 3-4 times a week   Sexual activity: Yes  Other Topics Concern   Not on file  Social History Narrative   Live in private residence with wife, Steward Drone. Had grandson and step daughter in the home   Social Determinants of Health   Financial Resource Strain: Not on file  Food Insecurity: No Food Insecurity (05/18/2023)   Hunger Vital Sign    Worried About Running Out of Food in the Last Year: Never true    Ran Out of Food in the Last Year: Never  true  Transportation Needs: No Transportation Needs (05/18/2023)   PRAPARE - Administrator, Civil Service (Medical): No    Lack of Transportation (Non-Medical): No  Physical Activity: Not on file  Stress: Not on file  Social Connections: Not on file  Intimate Partner Violence: Patient Unable To Answer (05/18/2023)   Humiliation, Afraid, Rape, and Kick questionnaire    Fear of Current or Ex-Partner: Patient unable to answer    Emotionally Abused: Patient unable to answer    Physically Abused: Patient unable to answer    Sexually Abused: Patient unable to answer    Past Surgical History:  Procedure Laterality Date   AMPUTATION TOE Right 12/02/2019   Procedure: AMPUTATION RIGHT 5TH TOE;  Surgeon: Rosetta Posner, DPM;  Location: [facility];  Service: Podiatry;  Laterality: Right;   APPLICATION OF WOUND VAC Bilateral 11/28/2019   Procedure: APPLICATION OF WOUND VAC;  Surgeon: Annice Needy, MD;  Location: [facility];  Service: Vascular;  Laterality: Bilateral;  Prevena    BACK SURGERY     lower ruptured disk   CENTRAL VENOUS CATHETER INSERTION  11/28/2019   Procedure: INSERTION  CENTRAL LINE ADULT;  Surgeon: Annice Needy, MD;  Location: [facility];  Service: Vascular;;   ENDARTERECTOMY FEMORAL Bilateral 11/28/2019   Procedure: ENDARTERECTOMY FEMORAL;  Surgeon: Annice Needy, MD;  Location: [facility];  Service: Vascular;  Laterality: Bilateral;   ENDARTERECTOMY FEMORAL Right 05/23/2023   Procedure: ENDARTERECTOMY FEMORAL REDO;  Surgeon: Annice Needy, MD;  Location: [facility];  Service: Vascular;  Laterality: Right;   ENDOVASCULAR REPAIR/STENT GRAFT Right 05/23/2023   Procedure: ENDOVASCULAR REPAIR/STENT GRAFT;  Surgeon: Annice Needy, MD;  Location: [facility];  Service: Vascular;  Laterality: Right;  Right SFA Stents   LOWER EXTREMITY ANGIOGRAPHY Right 11/14/2019   Procedure: LOWER EXTREMITY ANGIOGRAPHY;  Surgeon: Annice Needy, MD;  Location: [facility];  Service: Cardiovascular;  Laterality: Right;   LOWER EXTREMITY ANGIOGRAPHY Right 12/19/2021   Procedure: Lower Extremity Angiography;  Surgeon: Annice Needy, MD;  Location: [facility];  Service: Cardiovascular;  Laterality: Right;   LOWER EXTREMITY ANGIOGRAPHY Right 01/30/2022   Procedure: Lower Extremity Angiography;  Surgeon: Annice Needy, MD;  Location: [facility];  Service: Cardiovascular;  Laterality: Right;   LOWER EXTREMITY ANGIOGRAPHY Right 05/21/2023   Procedure: Lower Extremity Angiography;  Surgeon: Annice Needy, MD;  Location: [facility];  Service: Cardiovascular;  Laterality: Right;   MECHANICAL THROMBECTOMY WITH AORTOGRAM AND INTERVENTION  05/23/2023   Procedure: MECHANICAL THROMBECTOMY right popliteal artery;  Surgeon: Annice Needy, MD;  Location: [facility];  Service: Vascular;;   STOMACH SURGERY     exploratory   TONSILECTOMY, ADENOIDECTOMY, BILATERAL MYRINGOTOMY AND TUBES     TONSILLECTOMY      Family History  Problem Relation Age of Onset   Leukemia Mother    Heart disease Father        a. CABG   Heart disease Sister        a. MVR   Cancer Sister    Cancer  Brother    CAD Brother        a. CABG    No Known Allergies     Latest Ref Rng & Units 06/06/2023   11:47 AM 05/26/2023    5:29 AM 05/25/2023    3:36 AM  CBC  WBC 4.0 - 10.5 K/uL 13.5  9.7  11.6   Hemoglobin 13.0 - 17.0  g/dL 02.7  25.3  66.4   Hematocrit 39.0 - 52.0 % 37.8  30.2  35.3   Platelets 150 - 400 K/uL 644  311  324       CMP     Component Value Date/Time   NA 136 06/06/2023 1147   K 4.6 06/06/2023 1147   CL 101 06/06/2023 1147   CO2 25 06/06/2023 1147   GLUCOSE 119 (H) 06/06/2023 1147   BUN 10 06/06/2023 1147   CREATININE 0.86 06/06/2023 1147   CALCIUM 9.2 06/06/2023 1147   PROT 7.9 06/06/2023 1147   ALBUMIN 3.5 06/06/2023 1147   AST 16 06/06/2023 1147   ALT 16 06/06/2023 1147   ALKPHOS 78 06/06/2023 1147   BILITOT 0.7 06/06/2023 1147   GFRNONAA >60 06/06/2023 1147     No results found.     Assessment & Plan:   1. Critical limb ischemia of right lower extremity (HCC) All staples removed today.  The patient does have some small dehiscence in the groin wound and based on the recent culture results he was sent in Bactrim.  Will also use Medihoney in the area to help with debridement of the wound.  The patient will be undergoing a right transmetatarsal amputation tomorrow.  Will plan on having him return in 2 weeks to determine his wound healing status and perfusion for his amputation site following his surgery.   Current Outpatient Medications on File Prior to Visit  Medication Sig Dispense Refill   apixaban (ELIQUIS) 5 MG TABS tablet Take 1 tablet (5 mg total) by mouth 2 (two) times daily. (Patient not taking: Reported on 06/19/2023) 60 tablet 1   aspirin EC 81 MG tablet Take 1 tablet (81 mg total) by mouth daily. (Patient not taking: Reported on 06/19/2023) 150 tablet 2   atorvastatin (LIPITOR) 40 MG tablet Take 1 tablet (40 mg total) by mouth daily. 30 tablet 1   clopidogrel (PLAVIX) 75 MG tablet Take 75 mg by mouth daily. (Patient not taking: Reported on  06/19/2023)     dexlansoprazole (DEXILANT) 60 MG capsule Take 60 mg by mouth as needed.     gabapentin (NEURONTIN) 300 MG capsule Take 1 capsule (300 mg total) by mouth 2 (two) times daily. 60 capsule 1   oxyCODONE-acetaminophen (PERCOCET/ROXICET) 5-325 MG tablet Take 1 tablet by mouth every 4 (four) hours as needed for severe pain. 30 tablet 0   No current facility-administered medications on file prior to visit.    There are no Patient Instructions on file for this visit. No follow-ups on file.   Georgiana Spinner, NP

## 2023-06-21 NOTE — Anesthesia Procedure Notes (Signed)
Procedure Name: LMA Insertion Date/Time: 06/21/2023 9:48 AM  Performed by: Barbette Hair, CRNAPre-anesthesia Checklist: Patient identified, Emergency Drugs available, Suction available, Patient being monitored and Timeout performed Patient Re-evaluated:Patient Re-evaluated prior to induction Oxygen Delivery Method: Circle system utilized Preoxygenation: Pre-oxygenation with 100% oxygen Induction Type: IV induction LMA: LMA inserted LMA Size: 5.0 Tube type: Oral Number of attempts: 1 Placement Confirmation: positive ETCO2, breath sounds checked- equal and bilateral and CO2 detector Tube secured with: Tape Dental Injury: Teeth and Oropharynx as per pre-operative assessment

## 2023-06-21 NOTE — Anesthesia Postprocedure Evaluation (Signed)
Anesthesia Post Note  Patient: Brendan Williams  Procedure(s) Performed: TRANSMETATARSAL AMPUTATION (Right: Toe)  Patient location during evaluation: PACU Anesthesia Type: General Level of consciousness: awake and alert Pain management: pain level controlled Vital Signs Assessment: post-procedure vital signs reviewed and stable Respiratory status: spontaneous breathing, nonlabored ventilation, respiratory function stable and patient connected to nasal cannula oxygen Cardiovascular status: stable and blood pressure returned to baseline Postop Assessment: no apparent nausea or vomiting Anesthetic complications: no   No notable events documented.   Last Vitals:  Vitals:   06/21/23 1130 06/21/23 1145  BP: 105/81 105/70  Pulse: (!) 53 (!) 50  Resp: (!) 8 (!) 9  Temp:    SpO2: 95% 98%    Last Pain:  Vitals:   06/21/23 1145  TempSrc:   PainSc: 4                  Corinda Gubler

## 2023-06-21 NOTE — Transfer of Care (Signed)
Immediate Anesthesia Transfer of Care Note  Patient: Brendan Williams  Procedure(s) Performed: TRANSMETATARSAL AMPUTATION (Right: Toe)  Patient Location: PACU  Anesthesia Type: General ETT  Level of Consciousness: awake, alert  and patient cooperative  Airway and Oxygen Therapy: Patient Spontanous Breathing and Patient connected to supplemental oxygen  Post-op Assessment: Post-op Vital signs reviewed, Patient's Cardiovascular Status Stable, Respiratory Function Stable, Patent Airway and No signs of Nausea or vomiting  Post-op Vital Signs: Reviewed and stable  Complications: No notable events documented.

## 2023-06-21 NOTE — H&P (Signed)
HISTORY AND PHYSICAL INTERVAL NOTE:  06/21/2023  9:11 AM  Brendan Williams  has presented today for surgery, with the diagnosis of I73.9 - Peripheral arterial disease I96 - Gangrene of right foot.  The various methods of treatment have been discussed with the patient.  No guarantees were given.  After consideration of risks, benefits and other options for treatment, the patient has consented to surgery.  I have reviewed the patients' chart and labs.    PROCEDURE: RIGHT TRANSMETATARSAL AMPUTATION   A history and physical examination was performed in my office.  The patient was reexamined.  There have been no changes to this history and physical examination.  Rosetta Posner, DPM

## 2023-06-21 NOTE — Progress Notes (Signed)
Assisted Zak ANMD with popliteal/saphenous, ultrasound guided block. Side rails up, monitors on throughout procedure. See vital signs in flow sheet. Tolerated Procedure well.

## 2023-06-21 NOTE — Anesthesia Preprocedure Evaluation (Signed)
Anesthesia Evaluation  Patient identified by MRN, date of birth, ID band Patient awake    Reviewed: Allergy & Precautions, NPO status , Patient's Chart, lab work & pertinent test results  History of Anesthesia Complications Negative for: history of anesthetic complications  Airway Mallampati: III  TM Distance: >3 FB Neck ROM: full    Dental  (+) Chipped, Poor Dentition, Missing   Pulmonary neg shortness of breath, COPD, Patient abstained from smoking.Not current smoker, former smoker Patient quit smoking last month.   Pulmonary exam normal        Cardiovascular Exercise Tolerance: Good (-) angina + Peripheral Vascular Disease  (-) Past MI Normal cardiovascular exam     Neuro/Psych  PSYCHIATRIC DISORDERS Anxiety     negative neurological ROS     GI/Hepatic negative GI ROS, Neg liver ROS,neg GERD  ,,  Endo/Other  negative endocrine ROS    Renal/GU      Musculoskeletal   Abdominal   Peds  Hematology negative hematology ROS (+)   Anesthesia Other Findings Past Medical History: No date: Peripheral arterial disease (HCC)  Past Surgical History: 12/02/2019: AMPUTATION TOE; Right     Comment:  Procedure: AMPUTATION RIGHT 5TH TOE;  Surgeon: Rosetta Posner, DPM;  Location: [facility];  Service: Podiatry;                Laterality: Right; 11/28/2019: APPLICATION OF WOUND VAC; Bilateral     Comment:  Procedure: APPLICATION OF WOUND VAC;  Surgeon: Annice Needy, MD;  Location: [facility];  Service: Vascular;                Laterality: Bilateral;  Prevena  No date: BACK SURGERY     Comment:  lower ruptured disk 11/28/2019: CENTRAL VENOUS CATHETER INSERTION     Comment:  Procedure: INSERTION CENTRAL LINE ADULT;  Surgeon: Annice Needy, MD;  Location: [facility];  Service: Vascular;; 11/28/2019: ENDARTERECTOMY FEMORAL; Bilateral     Comment:  Procedure: ENDARTERECTOMY FEMORAL;  Surgeon: Annice Needy, MD;  Location: [facility];  Service: Vascular;                Laterality: Bilateral; 11/14/2019: LOWER EXTREMITY ANGIOGRAPHY; Right     Comment:  Procedure: LOWER EXTREMITY ANGIOGRAPHY;  Surgeon: Annice Needy, MD;  Location: [facility];  Service:               Cardiovascular;  Laterality: Right; 12/19/2021: LOWER EXTREMITY ANGIOGRAPHY; Right     Comment:  Procedure: Lower Extremity Angiography;  Surgeon: Annice Needy, MD;  Location: [facility];  Service:               Cardiovascular;  Laterality: Right; 01/30/2022: LOWER EXTREMITY ANGIOGRAPHY; Right     Comment:  Procedure: Lower Extremity Angiography;  Surgeon: Annice Needy, MD;  Location: [facility];  Service:  Cardiovascular;  Laterality: Right; 05/21/2023: LOWER EXTREMITY ANGIOGRAPHY; Right     Comment:  Procedure: Lower Extremity Angiography;  Surgeon: Annice Needy, MD;  Location: [facility];  Service:               Cardiovascular;  Laterality: Right; No date: STOMACH SURGERY No date: TONSILECTOMY, ADENOIDECTOMY, BILATERAL MYRINGOTOMY AND TUBES  BMI    Body Mass Index: 22.75 kg/m      Reproductive/Obstetrics negative OB ROS                              Anesthesia Physical Anesthesia Plan  ASA: 3  Anesthesia Plan: General ETT   Post-op Pain Management: Regional block, Tylenol PO (post-op) and Fentanyl IV   Induction: Intravenous  PONV Risk Score and Plan: 2 and Ondansetron, Dexamethasone, Midazolam and Treatment may vary due to age or medical condition  Airway Management Planned: LMA  Additional Equipment: Arterial line  Intra-op Plan:   Post-operative Plan: Extubation in OR and Possible Post-op intubation/ventilation  Informed Consent: I have reviewed the patients History and Physical, chart, labs and discussed the procedure including the risks, benefits and  alternatives for the proposed anesthesia with the patient or authorized representative who has indicated his/her understanding and acceptance.     Dental Advisory Given  Plan Discussed with: Anesthesiologist, CRNA and Surgeon  Anesthesia Plan Comments: (Patient consented for risks of anesthesia including but not limited to:  - adverse reactions to medications - damage to eyes, teeth, lips or other oral mucosa - nerve damage due to positioning  - sore throat or hoarseness - Damage to heart, brain, nerves, lungs, other parts of body or loss of life  Discussed r/b/a of adductor canal and popliteal nerve block, including:  - bleeding, infection, nerve damage - poor or non functioning block. - reactions and toxicity to local anesthetic Patient understands.   )        Anesthesia Quick Evaluation

## 2023-06-21 NOTE — Op Note (Signed)
PODIATRY / FOOT AND ANKLE SURGERY OPERATIVE REPORT    SURGEON: Rosetta Posner, DPM  PRE-OPERATIVE DIAGNOSIS:  1.  Right forefoot gangrene, dry, stable 2.  PVD 3.  History of smoking  POST-OPERATIVE DIAGNOSIS: Same  PROCEDURE(S): Right transmetatarsal amputation  HEMOSTASIS: Right ankle tourniquet  ANESTHESIA: general  ESTIMATED BLOOD LOSS: 30 cc  FINDING(S): 1.  Gangrene right forefoot  PATHOLOGY/SPECIMEN(S): Right forefoot path specimen, wound culture  INDICATIONS:   Brendan Williams is a 62 y.o. male who presents with dry stable gangrene to the right forefoot.  Patient has been having issues with this now for several weeks.  He had a few procedures performed with vascular surgery with [facility] vein and vascular to restore circulation to the right lower extremity.  Unfortunately patient subsequently had further gangrenous changes to the right forefoot which now appear to be dry and stable and appear to be demarcated fairly well at this time.  All treatment options were discussed with the patient both conservative and surgical attempts at correction include potential risks and complications at this time patient is elected for surgical intervention consisting of right transmetatarsal amputation.  No guarantees given.  Patient remains at high risk for limb loss due to history of PVD and amputation.  Consent obtained prior to procedure.  DESCRIPTION: After obtaining full informed written consent, the patient was brought back to the operating room and placed supine upon the operating table.  The patient received IV antibiotics prior to induction.  A popliteal and saphenous nerve block was performed by anesthesia preoperatively.  After obtaining adequate anesthesia, the patient was prepped and draped in the standard fashion.  Attention was then directed to the right forefoot.  A fishmouth type of incision was then marked out creating a larger plantar flap and dorsal flap over the midshaft  of the metatarsal phalangeal joint areas dorsally and over the metatarsal heads plantarly.  A fair amount of bleeding occurred to the area.  Hemostasis was difficult to control so the pneumatic ankle tourniquet was inflated after Esmarch bandage was used.  Hemostasis appeared to be well achieved at this point after further electrocauterization was performed.  This incision was made in such a way to remove all the gangrenous tissue.  The incision was made straight to bone.  At this time metatarsal phalangeal joints 1 through 5 are identified.  An extensor tenotomy and capsulotomy was performed followed by release the collateral and suspensory ligaments as well as any connection to the plantar plate and flexor tendon.  Digits 1 through 5 were disarticulated and passed off in the operative site.  Circumferential dissection was then continued around metatarsals 1 through 5 to the level of the midshaft.  At this time a sagittal saw was then used to create osteotomies through metatarsals 1 through 5 with the appropriate beveling.  Once this was performed circumferential dissection was then continued around the metatarsals further to remove metatarsals 1 through 5 distally.  The surgical site was flushed with copious amounts normal sterile saline.  All bleeding vessels were cauterized as necessary.  The extensor tendons and flexor tendons as well as plantar plates were resected as far proximally as possible.  The pneumatic ankle tourniquet was deflated and a prompt hyperemic response was noted right foot remaining.  Any bleeders were then cauterized yet further with electrocauterization.  Hemostasis appeared to be well achieved.  At this time the deep fascia/subcutaneous tissue was reapproximated well coapted with 3-0 Vicryl.  The subcutaneous tissue was then reapproximated well  coapted further with 3-0 Vicryl.  The skin was then reapproximated well coapted with skin stapler and 3-0 nylon.  There did appear to be 1 area  that was fairly tight for skin closure but skin closure was able to be obtained at the dorsal lateral aspect of the foot due to the amount of gangrenous necrosis present to this area that had to be resected.  May require local wound care if nonhealing in the future.  Hemostasis appeared to be well achieved overall prior to closure.    A postoperative dressing was then applied consisting of Xeroform to the incision area followed by 4 x 4 gauze, ABD, Kerlix, Ace wrap.  The patient tolerated the procedure and anesthesia well and was transferred to recovery in vital signs stable vascular status appearing to be intact to the right foot that remained.  Following a period of postoperative monitoring the patient be discharged home with the appropriate orders, instructions, and medications.  Patient is to remain nonweightbearing at all times to the right lower extremity for the next 2 to 3 weeks.  Patient is to restart blood thinners 24 hours after surgical intervention.  COMPLICATIONS: None  CONDITION: Good, stable  Rosetta Posner, DPM

## 2023-06-26 ENCOUNTER — Encounter: Payer: Self-pay | Admitting: Podiatry

## 2023-07-04 ENCOUNTER — Encounter (INDEPENDENT_AMBULATORY_CARE_PROVIDER_SITE_OTHER): Payer: Self-pay

## 2023-07-13 ENCOUNTER — Ambulatory Visit (INDEPENDENT_AMBULATORY_CARE_PROVIDER_SITE_OTHER): Payer: Medicaid Other | Admitting: Nurse Practitioner

## 2023-07-13 ENCOUNTER — Encounter (INDEPENDENT_AMBULATORY_CARE_PROVIDER_SITE_OTHER): Payer: Medicaid Other

## 2023-07-18 ENCOUNTER — Ambulatory Visit (INDEPENDENT_AMBULATORY_CARE_PROVIDER_SITE_OTHER): Payer: Medicaid Other | Admitting: Nurse Practitioner

## 2023-07-18 ENCOUNTER — Ambulatory Visit (INDEPENDENT_AMBULATORY_CARE_PROVIDER_SITE_OTHER): Payer: Medicaid Other

## 2023-07-18 ENCOUNTER — Other Ambulatory Visit (INDEPENDENT_AMBULATORY_CARE_PROVIDER_SITE_OTHER): Payer: Self-pay | Admitting: Nurse Practitioner

## 2023-07-18 ENCOUNTER — Encounter (INDEPENDENT_AMBULATORY_CARE_PROVIDER_SITE_OTHER): Payer: Self-pay | Admitting: Nurse Practitioner

## 2023-07-18 VITALS — BP 115/79 | HR 65 | Resp 18 | Ht 73.0 in | Wt 166.8 lb

## 2023-07-18 DIAGNOSIS — I739 Peripheral vascular disease, unspecified: Secondary | ICD-10-CM

## 2023-07-18 DIAGNOSIS — Z9889 Other specified postprocedural states: Secondary | ICD-10-CM

## 2023-07-18 MED ORDER — MEDIHONEY WOUND/BURN DRESSING EX PSTE: 1.0000 | PASTE | Freq: Every day | CUTANEOUS | 3 refills | Status: DC

## 2023-07-18 NOTE — Progress Notes (Signed)
Subjective:    Patient ID: Brendan Williams, male    DOB: 12/02/1960, 62 y.o.   MRN: [id number] Chief Complaint  Patient presents with   Follow-up    2-3 week follow up with ABI    Brendan Williams is a 62 year old male who returns today for wound evaluation post endarterectomy.  The patient was scheduled to have noninvasive studies however due to his late arrival we were unable to get these done today.  Today he notes that there is no dressing on the wound.  He also notes that he has not been placing any dressing or doing any wound care because he notes that it was looking better at the time.  The area has no purulent drainage or foul-smelling odors.  The surrounding wound is not red.  Improving the wound and is a small undermining area approximately 1 cm.    Review of Systems  Skin:  Positive for wound.  All other systems reviewed and are negative.      Objective:   Physical Exam Vitals reviewed.  HENT:     Head: Normocephalic.  Cardiovascular:     Rate and Rhythm: Normal rate.  Pulmonary:     Effort: Pulmonary effort is normal.  Skin:    General: Skin is warm and dry.  Neurological:     Mental Status: He is alert and oriented to person, place, and time.     Gait: Gait abnormal.  Psychiatric:        Mood and Affect: Mood normal.        Behavior: Behavior normal.        Thought Content: Thought content normal.        Judgment: Judgment normal.     BP 115/79 (BP Location: Left Arm)   Pulse 65   Resp 18   Ht 6\' 1"  (1.854 m)   Wt 166 lb 12.8 oz (75.7 kg)   BMI 22.01 kg/m   Past Medical History:  Diagnosis Date   Anxiety    situational   Asthma    as a child   Atherosclerosis of native arteries of extremity with intermittent claudication (HCC)    Cellulitis and abscess of toe of right foot    Critical limb ischemia of right lower extremity (HCC)    Current smoker    Quit 04/16/23   Peripheral arterial disease (HCC)     Social History   Socioeconomic History    Marital status: Married    Spouse name: Schooler,BRENDA (Spouse)   Number of children: Not on file   Years of education: Not on file   Highest education level: Not on file  Occupational History   Not on file  Tobacco Use   Smoking status: Former    Current packs/day: 0.00    Average packs/day: 1 pack/day for 40.5 years (40.5 ttl pk-yrs)    Types: Cigarettes    Start date: 52    Quit date: 04/16/2023    Years since quitting: 0.2   Smokeless tobacco: Never  Vaping Use   Vaping status: Never Used  Substance and Sexual Activity   Alcohol use: Yes    Alcohol/week: 5.0 standard drinks of alcohol    Types: 5 Cans of beer per week    Comment: 4-5 beers a week   Drug use: Yes    Frequency: 3.0 times per week    Types: Marijuana    Comment: 3-4 times a week   Sexual activity: Yes  Other Topics Concern  Not on file  Social History Narrative   Live in private residence with wife, Steward Drone. Had grandson and step daughter in the home   Social Determinants of Health   Financial Resource Strain: Not on file  Food Insecurity: No Food Insecurity (05/18/2023)   Hunger Vital Sign    Worried About Running Out of Food in the Last Year: Never true    Ran Out of Food in the Last Year: Never true  Transportation Needs: No Transportation Needs (05/18/2023)   PRAPARE - Administrator, Civil Service (Medical): No    Lack of Transportation (Non-Medical): No  Physical Activity: Not on file  Stress: Not on file  Social Connections: Not on file  Intimate Partner Violence: Patient Unable To Answer (05/18/2023)   Humiliation, Afraid, Rape, and Kick questionnaire    Fear of Current or Ex-Partner: Patient unable to answer    Emotionally Abused: Patient unable to answer    Physically Abused: Patient unable to answer    Sexually Abused: Patient unable to answer    Past Surgical History:  Procedure Laterality Date   AMPUTATION TOE Right 12/02/2019   Procedure: AMPUTATION RIGHT 5TH TOE;   Surgeon: Rosetta Posner, DPM;  Location: [facility];  Service: Podiatry;  Laterality: Right;   APPLICATION OF WOUND VAC Bilateral 11/28/2019   Procedure: APPLICATION OF WOUND VAC;  Surgeon: Annice Needy, MD;  Location: [facility];  Service: Vascular;  Laterality: Bilateral;  Prevena    BACK SURGERY     lower ruptured disk   CENTRAL VENOUS CATHETER INSERTION  11/28/2019   Procedure: INSERTION CENTRAL LINE ADULT;  Surgeon: Annice Needy, MD;  Location: [facility];  Service: Vascular;;   ENDARTERECTOMY FEMORAL Bilateral 11/28/2019   Procedure: ENDARTERECTOMY FEMORAL;  Surgeon: Annice Needy, MD;  Location: [facility];  Service: Vascular;  Laterality: Bilateral;   ENDARTERECTOMY FEMORAL Right 05/23/2023   Procedure: ENDARTERECTOMY FEMORAL REDO;  Surgeon: Annice Needy, MD;  Location: [facility];  Service: Vascular;  Laterality: Right;   ENDOVASCULAR REPAIR/STENT GRAFT Right 05/23/2023   Procedure: ENDOVASCULAR REPAIR/STENT GRAFT;  Surgeon: Annice Needy, MD;  Location: [facility];  Service: Vascular;  Laterality: Right;  Right SFA Stents   LOWER EXTREMITY ANGIOGRAPHY Right 11/14/2019   Procedure: LOWER EXTREMITY ANGIOGRAPHY;  Surgeon: Annice Needy, MD;  Location: [facility];  Service: Cardiovascular;  Laterality: Right;   LOWER EXTREMITY ANGIOGRAPHY Right 12/19/2021   Procedure: Lower Extremity Angiography;  Surgeon: Annice Needy, MD;  Location: [facility];  Service: Cardiovascular;  Laterality: Right;   LOWER EXTREMITY ANGIOGRAPHY Right 01/30/2022   Procedure: Lower Extremity Angiography;  Surgeon: Annice Needy, MD;  Location: [facility];  Service: Cardiovascular;  Laterality: Right;   LOWER EXTREMITY ANGIOGRAPHY Right 05/21/2023   Procedure: Lower Extremity Angiography;  Surgeon: Annice Needy, MD;  Location: [facility];  Service: Cardiovascular;  Laterality: Right;   MECHANICAL THROMBECTOMY WITH AORTOGRAM AND INTERVENTION  05/23/2023   Procedure: MECHANICAL THROMBECTOMY  right popliteal artery;  Surgeon: Annice Needy, MD;  Location: [facility];  Service: Vascular;;   STOMACH SURGERY     exploratory   TONSILECTOMY, ADENOIDECTOMY, BILATERAL MYRINGOTOMY AND TUBES     TONSILLECTOMY     TRANSMETATARSAL AMPUTATION Right 06/21/2023   Procedure: TRANSMETATARSAL AMPUTATION;  Surgeon: Rosetta Posner, DPM;  Location: [facility];  Service: Orthopedics/Podiatry;  Laterality: Right;    Family History  Problem Relation Age of Onset   Leukemia  Mother    Heart disease Father        a. CABG   Heart disease Sister        a. MVR   Cancer Sister    Cancer Brother    CAD Brother        a. CABG    No Known Allergies     Latest Ref Rng & Units 06/06/2023   11:47 AM 05/26/2023    5:29 AM 05/25/2023    3:36 AM  CBC  WBC 4.0 - 10.5 K/uL 13.5  9.7  11.6   Hemoglobin 13.0 - 17.0 g/dL 84.1  32.4  40.1   Hematocrit 39.0 - 52.0 % 37.8  30.2  35.3   Platelets 150 - 400 K/uL 644  311  324       CMP     Component Value Date/Time   NA 136 06/06/2023 1147   K 4.6 06/06/2023 1147   CL 101 06/06/2023 1147   CO2 25 06/06/2023 1147   GLUCOSE 119 (H) 06/06/2023 1147   BUN 10 06/06/2023 1147   CREATININE 0.86 06/06/2023 1147   CALCIUM 9.2 06/06/2023 1147   PROT 7.9 06/06/2023 1147   ALBUMIN 3.5 06/06/2023 1147   AST 16 06/06/2023 1147   ALT 16 06/06/2023 1147   ALKPHOS 78 06/06/2023 1147   BILITOT 0.7 06/06/2023 1147   GFRNONAA >60 06/06/2023 1147     No results found.     Assessment & Plan:   1. Peripheral arterial disease with history of revascularization [facility]) Long discussion with the patient regarding wound care and the importance of doing this in order to prevent possible regression of the wound.  Because he has not been dressing it or covering it I sent in a culture to ensure there is no underlying infection but evaluation of the wound was promising today.  Sent in a prescription for Medihoney once again and instructed the patient to use wet-to-dry  dressings twice per day to help with debridement of the wound tissue.  Will plan on having him return in 3 weeks in order to reevaluate the wound.   Current Outpatient Medications on File Prior to Visit  Medication Sig Dispense Refill   amoxicillin-clavulanate (AUGMENTIN) 875-125 MG tablet Take 1 tablet by mouth 2 (two) times daily. 14 tablet 0   apixaban (ELIQUIS) 5 MG TABS tablet Take 1 tablet (5 mg total) by mouth 2 (two) times daily.     aspirin EC 81 MG tablet Take 1 tablet (81 mg total) by mouth daily.     atorvastatin (LIPITOR) 40 MG tablet Take 1 tablet (40 mg total) by mouth daily. 30 tablet 1   clopidogrel (PLAVIX) 75 MG tablet Take 1 tablet (75 mg total) by mouth daily.     dexlansoprazole (DEXILANT) 60 MG capsule Take 60 mg by mouth as needed.     gabapentin (NEURONTIN) 300 MG capsule Take 1 capsule (300 mg total) by mouth 2 (two) times daily. 60 capsule 1   leptospermum manuka honey (MEDIHONEY) PSTE paste Apply 1 Application topically daily. 44 mL 1   ondansetron (ZOFRAN) 4 MG tablet Take 1 tablet (4 mg total) by mouth every 8 (eight) hours as needed for nausea or vomiting. 20 tablet 0   oxyCODONE-acetaminophen (PERCOCET) 7.5-325 MG tablet Take 1 tablet by mouth every 4 (four) hours as needed for severe pain. 20 tablet 0   sulfamethoxazole-trimethoprim (BACTRIM DS) 800-160 MG tablet Take 1 tablet by mouth 2 (two) times daily. 20 tablet 0  No current facility-administered medications on file prior to visit.    There are no Patient Instructions on file for this visit. No follow-ups on file.   Georgiana Spinner, NP

## 2023-07-21 LAB — AEROBIC CULTURE

## 2023-08-08 ENCOUNTER — Other Ambulatory Visit (INDEPENDENT_AMBULATORY_CARE_PROVIDER_SITE_OTHER): Payer: Self-pay | Admitting: Vascular Surgery

## 2023-08-08 DIAGNOSIS — I739 Peripheral vascular disease, unspecified: Secondary | ICD-10-CM

## 2023-08-10 ENCOUNTER — Encounter (INDEPENDENT_AMBULATORY_CARE_PROVIDER_SITE_OTHER): Payer: Medicaid Other

## 2023-08-10 ENCOUNTER — Ambulatory Visit (INDEPENDENT_AMBULATORY_CARE_PROVIDER_SITE_OTHER): Payer: Medicaid Other | Admitting: Vascular Surgery

## 2023-09-27 ENCOUNTER — Encounter (INDEPENDENT_AMBULATORY_CARE_PROVIDER_SITE_OTHER): Payer: Self-pay | Admitting: Nurse Practitioner

## 2023-09-27 ENCOUNTER — Ambulatory Visit (INDEPENDENT_AMBULATORY_CARE_PROVIDER_SITE_OTHER): Payer: Medicaid Other | Admitting: Nurse Practitioner

## 2023-09-27 ENCOUNTER — Ambulatory Visit (INDEPENDENT_AMBULATORY_CARE_PROVIDER_SITE_OTHER): Payer: Medicaid Other

## 2023-09-27 ENCOUNTER — Inpatient Hospital Stay
Admission: EM | Admit: 2023-09-27 | Discharge: 2023-09-28 | DRG: 300 | Disposition: A | Discharge status: Home / Self Care | Payer: Medicaid Other | Source: Ambulatory Visit | Attending: Student | Admitting: Student

## 2023-09-27 ENCOUNTER — Encounter: Payer: Self-pay | Admitting: Internal Medicine

## 2023-09-27 ENCOUNTER — Other Ambulatory Visit: Payer: Self-pay

## 2023-09-27 VITALS — BP 93/72 | HR 80 | Resp 16

## 2023-09-27 DIAGNOSIS — I70219 Atherosclerosis of native arteries of extremities with intermittent claudication, unspecified extremity: Secondary | ICD-10-CM | POA: Diagnosis present

## 2023-09-27 DIAGNOSIS — E872 Acidosis, unspecified: Secondary | ICD-10-CM | POA: Diagnosis not present

## 2023-09-27 DIAGNOSIS — Z87891 Personal history of nicotine dependence: Secondary | ICD-10-CM | POA: Diagnosis not present

## 2023-09-27 DIAGNOSIS — Y838 Other surgical procedures as the cause of abnormal reaction of the patient, or of later complication, without mention of misadventure at the time of the procedure: Secondary | ICD-10-CM | POA: Diagnosis present

## 2023-09-27 DIAGNOSIS — E785 Hyperlipidemia, unspecified: Secondary | ICD-10-CM | POA: Insufficient documentation

## 2023-09-27 DIAGNOSIS — F17200 Nicotine dependence, unspecified, uncomplicated: Secondary | ICD-10-CM | POA: Diagnosis not present

## 2023-09-27 DIAGNOSIS — I998 Other disorder of circulatory system: Secondary | ICD-10-CM | POA: Diagnosis present

## 2023-09-27 DIAGNOSIS — G629 Polyneuropathy, unspecified: Secondary | ICD-10-CM | POA: Diagnosis present

## 2023-09-27 DIAGNOSIS — Z9889 Other specified postprocedural states: Secondary | ICD-10-CM | POA: Diagnosis not present

## 2023-09-27 DIAGNOSIS — T82856A Stenosis of peripheral vascular stent, initial encounter: Secondary | ICD-10-CM | POA: Diagnosis present

## 2023-09-27 DIAGNOSIS — I739 Peripheral vascular disease, unspecified: Secondary | ICD-10-CM | POA: Diagnosis not present

## 2023-09-27 DIAGNOSIS — R780 Finding of alcohol in blood: Secondary | ICD-10-CM | POA: Insufficient documentation

## 2023-09-27 DIAGNOSIS — Z7982 Long term (current) use of aspirin: Secondary | ICD-10-CM | POA: Diagnosis not present

## 2023-09-27 DIAGNOSIS — I70261 Atherosclerosis of native arteries of extremities with gangrene, right leg: Secondary | ICD-10-CM | POA: Diagnosis not present

## 2023-09-27 DIAGNOSIS — Z806 Family history of leukemia: Secondary | ICD-10-CM | POA: Diagnosis not present

## 2023-09-27 DIAGNOSIS — A419 Sepsis, unspecified organism: Secondary | ICD-10-CM | POA: Diagnosis not present

## 2023-09-27 DIAGNOSIS — Z809 Family history of malignant neoplasm, unspecified: Secondary | ICD-10-CM | POA: Diagnosis not present

## 2023-09-27 DIAGNOSIS — Z7901 Long term (current) use of anticoagulants: Secondary | ICD-10-CM | POA: Diagnosis not present

## 2023-09-27 DIAGNOSIS — I70221 Atherosclerosis of native arteries of extremities with rest pain, right leg: Principal | ICD-10-CM | POA: Diagnosis present

## 2023-09-27 DIAGNOSIS — Z8249 Family history of ischemic heart disease and other diseases of the circulatory system: Secondary | ICD-10-CM | POA: Diagnosis not present

## 2023-09-27 DIAGNOSIS — Z79899 Other long term (current) drug therapy: Secondary | ICD-10-CM | POA: Diagnosis not present

## 2023-09-27 DIAGNOSIS — Z89431 Acquired absence of right foot: Secondary | ICD-10-CM

## 2023-09-27 DIAGNOSIS — Z7902 Long term (current) use of antithrombotics/antiplatelets: Secondary | ICD-10-CM | POA: Diagnosis not present

## 2023-09-27 DIAGNOSIS — Y831 Surgical operation with implant of artificial internal device as the cause of abnormal reaction of the patient, or of later complication, without mention of misadventure at the time of the procedure: Secondary | ICD-10-CM | POA: Diagnosis present

## 2023-09-27 DIAGNOSIS — Z5320 Procedure and treatment not carried out because of patient's decision for unspecified reasons: Secondary | ICD-10-CM | POA: Diagnosis present

## 2023-09-27 LAB — CBC WITH DIFFERENTIAL/PLATELET
Abs Immature Granulocytes: 0.07 10*3/uL (ref 0.00–0.07)
Basophils Absolute: 0 10*3/uL (ref 0.0–0.1)
Basophils Relative: 0 %
Eosinophils Absolute: 0.1 10*3/uL (ref 0.0–0.5)
Eosinophils Relative: 1 %
HCT: 36.4 % — ABNORMAL LOW (ref 39.0–52.0)
Hemoglobin: 11.9 g/dL — ABNORMAL LOW (ref 13.0–17.0)
Immature Granulocytes: 1 %
Lymphocytes Relative: 12 %
Lymphs Abs: 1.3 10*3/uL (ref 0.7–4.0)
MCH: 29.8 pg (ref 26.0–34.0)
MCHC: 32.7 g/dL (ref 30.0–36.0)
MCV: 91.2 fL (ref 80.0–100.0)
Monocytes Absolute: 1.7 10*3/uL — ABNORMAL HIGH (ref 0.1–1.0)
Monocytes Relative: 16 %
Neutro Abs: 7.8 10*3/uL — ABNORMAL HIGH (ref 1.7–7.7)
Neutrophils Relative %: 70 %
Platelets: 312 10*3/uL (ref 150–400)
RBC: 3.99 MIL/uL — ABNORMAL LOW (ref 4.22–5.81)
RDW: 14.5 % (ref 11.5–15.5)
WBC: 11 10*3/uL — ABNORMAL HIGH (ref 4.0–10.5)
nRBC: 0 % (ref 0.0–0.2)

## 2023-09-27 LAB — APTT
aPTT: 45 s — ABNORMAL HIGH (ref 24–36)
aPTT: 128 s — ABNORMAL HIGH (ref 24–36)

## 2023-09-27 LAB — BASIC METABOLIC PANEL
Anion gap: 7 (ref 5–15)
BUN: 11 mg/dL (ref 8–23)
CO2: 29 mmol/L (ref 22–32)
Calcium: 8.8 mg/dL — ABNORMAL LOW (ref 8.9–10.3)
Chloride: 95 mmol/L — ABNORMAL LOW (ref 98–111)
Creatinine, Ser: 0.74 mg/dL (ref 0.61–1.24)
GFR, Estimated: >60 mL/min (ref >60)
Glucose, Bld: 108 mg/dL — ABNORMAL HIGH (ref 70–99)
Potassium: 4 mmol/L (ref 3.5–5.1)
Sodium: 131 mmol/L — ABNORMAL LOW (ref 135–145)

## 2023-09-27 LAB — TYPE AND SCREEN
ABO/RH(D): B POS
Antibody Screen: NEGATIVE

## 2023-09-27 LAB — VAS US ABI WITH/WO TBI
Left ABI: 0.84
Right ABI: 0

## 2023-09-27 LAB — PROTIME-INR
INR: 1.1 (ref 0.8–1.2)
Prothrombin Time: 14.3 s (ref 11.4–15.2)

## 2023-09-27 LAB — HEPARIN LEVEL (UNFRACTIONATED): Heparin Unfractionated: >1.1 [IU]/mL — ABNORMAL HIGH (ref 0.30–0.70)

## 2023-09-27 MED ORDER — BENZONATATE 100 MG PO CAPS: 100.0000 mg | ORAL_CAPSULE | Freq: Two times a day (BID) | ORAL | Status: DC | PRN

## 2023-09-27 MED ORDER — CLOPIDOGREL BISULFATE 75 MG PO TABS: 75.0000 mg | ORAL_TABLET | Freq: Every day | ORAL | Status: DC

## 2023-09-27 MED ORDER — MORPHINE SULFATE (PF) 2 MG/ML IV SOLN
2.0000 mg | INTRAVENOUS | Status: AC | PRN
  Administered 2023-09-27 – 2023-09-28 (×3): 2 mg via INTRAVENOUS
  Filled 2023-09-27 (×3): qty 1

## 2023-09-27 MED ORDER — ONDANSETRON HCL 4 MG/2ML IJ SOLN: 4.0000 mg | Freq: Four times a day (QID) | INTRAMUSCULAR | Status: DC | PRN

## 2023-09-27 MED ORDER — FENTANYL CITRATE PF 50 MCG/ML IJ SOSY
25.0000 ug | PREFILLED_SYRINGE | INTRAMUSCULAR | Status: AC | PRN
  Administered 2023-09-27 (×2): 25 ug via INTRAVENOUS
  Filled 2023-09-27 (×2): qty 1

## 2023-09-27 MED ORDER — ATORVASTATIN CALCIUM 20 MG PO TABS
40.0000 mg | ORAL_TABLET | Freq: Every day | ORAL | Status: DC
  Administered 2023-09-27: 40 mg via ORAL
  Filled 2023-09-27: qty 2

## 2023-09-27 MED ORDER — MELATONIN 5 MG PO TABS: 5.0000 mg | ORAL_TABLET | Freq: Every evening | ORAL | Status: DC | PRN

## 2023-09-27 MED ORDER — GABAPENTIN 300 MG PO CAPS
300.0000 mg | ORAL_CAPSULE | Freq: Two times a day (BID) | ORAL | Status: DC
  Administered 2023-09-27: 300 mg via ORAL
  Filled 2023-09-27: qty 1

## 2023-09-27 MED ORDER — ASPIRIN 81 MG PO TBEC: 81.0000 mg | DELAYED_RELEASE_TABLET | Freq: Every day | ORAL | Status: DC

## 2023-09-27 MED ORDER — ACETAMINOPHEN 325 MG PO TABS: 650.0000 mg | ORAL_TABLET | Freq: Four times a day (QID) | ORAL | Status: DC | PRN

## 2023-09-27 MED ORDER — DIPHENHYDRAMINE HCL 50 MG/ML IJ SOLN: 12.5000 mg | Freq: Four times a day (QID) | INTRAMUSCULAR | Status: AC | PRN

## 2023-09-27 MED ORDER — ACETAMINOPHEN 325 MG RE SUPP
650.0000 mg | Freq: Four times a day (QID) | RECTAL | Status: DC | PRN
Start: 2023-09-27 — End: 2023-09-28

## 2023-09-27 MED ORDER — ONDANSETRON HCL 4 MG PO TABS: 4.0000 mg | ORAL_TABLET | Freq: Four times a day (QID) | ORAL | Status: DC | PRN

## 2023-09-27 MED ORDER — PANTOPRAZOLE SODIUM 40 MG IV SOLR: 40.0000 mg | Freq: Every day | INTRAVENOUS | Status: DC | PRN

## 2023-09-27 MED ORDER — HEPARIN BOLUS VIA INFUSION
2700.0000 [IU] | Freq: Once | INTRAVENOUS | Status: AC
  Administered 2023-09-27: 2700 [IU] via INTRAVENOUS
  Filled 2023-09-27: qty 2700

## 2023-09-27 MED ORDER — HEPARIN (PORCINE) 25000 UT/250ML-% IV SOLN
1300.0000 [IU]/h | INTRAVENOUS | Status: DC
Start: 2023-09-27 — End: 2023-09-28
  Administered 2023-09-27: 1300 [IU]/h via INTRAVENOUS
  Filled 2023-09-27: qty 250

## 2023-09-27 NOTE — Consult Note (Signed)
Hospital Consult    Reason for Consult:  Right lower extremity Ischemia Requesting Physician:  Dr Artis Delay MD  MRN #:  [id number]  History of Present Illness: This is a 62 y.o. male who is well-known to vascular surgery with a past medical history that includes anxiety, asthma, PAD with claudication, cellulitis.  He was an avid smoker but recently quit 2 months ago.  Patient presents to the vein and vascular clinic today for follow-up with a cold painful right lower extremity.  Limb ischemia was noted and patient was sent to the emergency department here at [facility].  Patient will be admitted to the hospitalist and started on heparin therapy.  Pain medications were ordered.  Plan is vascular intervention tomorrow on 09/28/2023 with Dr. Festus Barren MD.  Patient endorses his right lower extremity started to hurt about a week ago.  He endorses that it progressively has gotten worse over the last 2 to 3 days and now is severely cold and severely painful.  Wife is at the bedside also noted that his she saw color changes.  Patient was last seen by vein and vascular surgery on 05/21/2023 and had a right lower extremity angiogram.  He was then taken to the operating room on 05/23/2023 for femoral endarterectomy redo with stent graft placements.  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  Patient has recovered from this surgery very well and has healed very well.  Until last week when he started having some pain and difficulty to his right lower extremity again.  Patient is noted to have been compliant with his medications which were aspirin 81 mg daily Plavix 75 mg daily and Eliquis 5 mg twice a day.  Patient also notes that he quit smoking 2 months ago.  Past Medical History:  Diagnosis Date   Anxiety    situational   Asthma    as a child   Atherosclerosis of native arteries of extremity with intermittent  claudication (HCC)    Cellulitis and abscess of toe of right foot    Critical limb ischemia of right lower extremity (HCC)    Current smoker    Quit 04/16/23   Peripheral arterial disease (HCC)     Past Surgical History:  Procedure Laterality Date   AMPUTATION TOE Right 12/02/2019   Procedure: AMPUTATION RIGHT 5TH TOE;  Surgeon: Rosetta Posner, DPM;  Location: [facility];  Service: Podiatry;  Laterality: Right;   APPLICATION OF WOUND VAC Bilateral 11/28/2019   Procedure: APPLICATION OF WOUND VAC;  Surgeon: Annice Needy, MD;  Location: [facility];  Service: Vascular;  Laterality: Bilateral;  Prevena    BACK SURGERY     lower ruptured disk   CENTRAL VENOUS CATHETER INSERTION  11/28/2019   Procedure: INSERTION CENTRAL LINE ADULT;  Surgeon: Annice Needy, MD;  Location: [facility];  Service: Vascular;;   ENDARTERECTOMY FEMORAL Bilateral 11/28/2019   Procedure: ENDARTERECTOMY FEMORAL;  Surgeon: Annice Needy, MD;  Location: [facility];  Service: Vascular;  Laterality: Bilateral;   ENDARTERECTOMY FEMORAL Right 05/23/2023   Procedure: ENDARTERECTOMY FEMORAL REDO;  Surgeon: Annice Needy, MD;  Location: [facility];  Service: Vascular;  Laterality: Right;   ENDOVASCULAR REPAIR/STENT GRAFT Right 05/23/2023   Procedure: ENDOVASCULAR REPAIR/STENT GRAFT;  Surgeon: Annice Needy, MD;  Location: [facility];  Service: Vascular;  Laterality: Right;  Right SFA Stents   LOWER EXTREMITY ANGIOGRAPHY Right 11/14/2019  Procedure: LOWER EXTREMITY ANGIOGRAPHY;  Surgeon: Annice Needy, MD;  Location: [facility];  Service: Cardiovascular;  Laterality: Right;   LOWER EXTREMITY ANGIOGRAPHY Right 12/19/2021   Procedure: Lower Extremity Angiography;  Surgeon: Annice Needy, MD;  Location: [facility];  Service: Cardiovascular;  Laterality: Right;   LOWER EXTREMITY ANGIOGRAPHY Right 01/30/2022   Procedure: Lower Extremity Angiography;  Surgeon: Annice Needy, MD;  Location: [facility];  Service: Cardiovascular;   Laterality: Right;   LOWER EXTREMITY ANGIOGRAPHY Right 05/21/2023   Procedure: Lower Extremity Angiography;  Surgeon: Annice Needy, MD;  Location: [facility];  Service: Cardiovascular;  Laterality: Right;   MECHANICAL THROMBECTOMY WITH AORTOGRAM AND INTERVENTION  05/23/2023   Procedure: MECHANICAL THROMBECTOMY right popliteal artery;  Surgeon: Annice Needy, MD;  Location: [facility];  Service: Vascular;;   STOMACH SURGERY     exploratory   TONSILECTOMY, ADENOIDECTOMY, BILATERAL MYRINGOTOMY AND TUBES     TONSILLECTOMY     TRANSMETATARSAL AMPUTATION Right 06/21/2023   Procedure: TRANSMETATARSAL AMPUTATION;  Surgeon: Rosetta Posner, DPM;  Location: [facility];  Service: Orthopedics/Podiatry;  Laterality: Right;    No Known Allergies  Prior to Admission medications   Medication Sig Start Date End Date Taking? Authorizing Provider  amoxicillin-clavulanate (AUGMENTIN) 875-125 MG tablet Take 1 tablet by mouth 2 (two) times daily. 06/21/23   Rosetta Posner, DPM  apixaban (ELIQUIS) 5 MG TABS tablet Take 1 tablet (5 mg total) by mouth 2 (two) times daily. 06/21/23 08/20/23  Rosetta Posner, DPM  aspirin EC 81 MG tablet Take 1 tablet (81 mg total) by mouth daily. 06/21/23   Rosetta Posner, DPM  atorvastatin (LIPITOR) 40 MG tablet Take 1 tablet (40 mg total) by mouth daily. 05/30/23 07/29/23  Lonia Blood, MD  clopidogrel (PLAVIX) 75 MG tablet Take 1 tablet (75 mg total) by mouth daily. 06/21/23   Rosetta Posner, DPM  dexlansoprazole (DEXILANT) 60 MG capsule Take 60 mg by mouth as needed.    [provider]  gabapentin (NEURONTIN) 300 MG capsule Take 1 capsule (300 mg total) by mouth 2 (two) times daily. 05/30/23 07/29/23  Lonia Blood, MD  leptospermum manuka honey (MEDIHONEY) PSTE paste Apply 1 Application topically daily. 06/20/23   Georgiana Spinner, NP  leptospermum manuka honey (MEDIHONEY) PSTE paste Apply 1 Application topically daily. 07/18/23   Georgiana Spinner, NP  ondansetron  (ZOFRAN) 4 MG tablet Take 1 tablet (4 mg total) by mouth every 8 (eight) hours as needed for nausea or vomiting. 06/21/23   Rosetta Posner, DPM  oxyCODONE-acetaminophen (PERCOCET) 7.5-325 MG tablet Take 1 tablet by mouth every 4 (four) hours as needed for severe pain. 06/21/23 06/20/24  Rosetta Posner, DPM  sulfamethoxazole-trimethoprim (BACTRIM DS) 800-160 MG tablet Take 1 tablet by mouth 2 (two) times daily. Patient not taking: Reported on 09/27/2023 06/20/23   Georgiana Spinner, NP    Social History   Socioeconomic History   Marital status: Married    Spouse name: JONY, NUTTALL (Spouse)   Number of children: Not on file   Years of education: Not on file   Highest education level: Not on file  Occupational History   Not on file  Tobacco Use   Smoking status: Former    Current packs/day: 0.00    Average packs/day: 1 pack/day for 40.5 years (40.5 ttl pk-yrs)    Types: Cigarettes    Start date: 14    Quit date: 04/16/2023    Years since  quitting: 0.4   Smokeless tobacco: Never  Vaping Use   Vaping status: Never Used  Substance and Sexual Activity   Alcohol use: Yes    Alcohol/week: 5.0 standard drinks of alcohol    Types: 5 Cans of beer per week    Comment: 4-5 beers a week   Drug use: Yes    Frequency: 3.0 times per week    Types: Marijuana    Comment: 3-4 times a week   Sexual activity: Yes  Other Topics Concern   Not on file  Social History Narrative   Live in private residence with wife, Steward Drone. Had grandson and step daughter in the home   Social Drivers of Health   Financial Resource Strain: Not on file  Food Insecurity: No Food Insecurity (05/18/2023)   Hunger Vital Sign    Worried About Running Out of Food in the Last Year: Never true    Ran Out of Food in the Last Year: Never true  Transportation Needs: No Transportation Needs (05/18/2023)   PRAPARE - Administrator, Civil Service (Medical): No    Lack of Transportation (Non-Medical): No  Physical Activity:  Not on file  Stress: Not on file  Social Connections: Not on file  Intimate Partner Violence: Patient Unable To Answer (05/18/2023)   Humiliation, Afraid, Rape, and Kick questionnaire    Fear of Current or Ex-Partner: Patient unable to answer    Emotionally Abused: Patient unable to answer    Physically Abused: Patient unable to answer    Sexually Abused: Patient unable to answer     Family History  Problem Relation Age of Onset   Leukemia Mother    Heart disease Father        a. CABG   Heart disease Sister        a. MVR   Cancer Sister    Cancer Brother    CAD Brother        a. CABG    ROS: Otherwise negative unless mentioned in HPI  Physical Examination  Vitals:   09/27/23 1229  BP: (!) 114/96  Pulse: 73  Resp: 18  Temp: 98.2 F (36.8 C)  SpO2: 97%   Body mass index is 23.09 kg/m.  General:  WDWN in NAD Gait: Not observed HENT: WNL, normocephalic Pulmonary: normal non-labored breathing, without Rales, rhonchi,  wheezing Cardiac: regular, without  Murmurs, rubs or gallops; without carotid bruits Abdomen: Positive bowel sounds throughout, soft, NT/ND, no masses Skin: without rashes Vascular Exam/Pulses: Left lower extremity warm to touch with good capillary refill and no edema.  Positive Doppler popliteal, dorsalis pedis, posttibial pulses.  Right lower extremity with transmetatarsal amputation from 06/21/2023.  Right lower extremity is cold to touch and very painful with no capillary refill.  Unable to palpate or Doppler DP/PT pulses.  Patient did have a slight popliteal pulse on Doppler. Extremities: with ischemic changes, without Gangrene , with cellulitis; without open wounds;  Musculoskeletal: no muscle wasting or atrophy  Neurologic: A&O X 3;  No focal weakness or paresthesias are detected; speech is fluent/normal Psychiatric:  The pt has Normal affect. Lymph:  Unremarkable  CBC    Component Value Date/Time   WBC 11.0 (H) 09/27/2023 1231   RBC 3.99 (L)  09/27/2023 1231   HGB 11.9 (L) 09/27/2023 1231   HCT 36.4 (L) 09/27/2023 1231   PLT 312 09/27/2023 1231   MCV 91.2 09/27/2023 1231   MCH 29.8 09/27/2023 1231   MCHC 32.7 09/27/2023 1231  RDW 14.5 09/27/2023 1231   LYMPHSABS 1.3 09/27/2023 1231   MONOABS 1.7 (H) 09/27/2023 1231   EOSABS 0.1 09/27/2023 1231   BASOSABS 0.0 09/27/2023 1231    BMET    Component Value Date/Time   NA 131 (L) 09/27/2023 1231   K 4.0 09/27/2023 1231   CL 95 (L) 09/27/2023 1231   CO2 29 09/27/2023 1231   GLUCOSE 108 (H) 09/27/2023 1231   BUN 11 09/27/2023 1231   CREATININE 0.74 09/27/2023 1231   CALCIUM 8.8 (L) 09/27/2023 1231   GFRNONAA >60 09/27/2023 1231   GFRAA >60 12/04/2019 0523    COAGS: Lab Results  Component Value Date   INR 1.1 09/27/2023   INR 1.0 06/06/2023   INR 1.1 05/18/2023     Non-Invasive Vascular Imaging:   None ordered at this time.  Statin:  Yes.   Beta Blocker:  No. Aspirin:  Yes.   ACEI:  No. ARB:  No. CCB use:  No Other antiplatelets/anticoagulants:  Yes.   Plavix 75 mg daily and Eliquis 5 mg Twice daily.   ASSESSMENT/PLAN: This is a 62 y.o. male who presents to [facility] emergency department with worsening right lower extremity pain.  He was sent to the emergency room via vein and vascular clinic after being examined for questionable limb ischemia.  On exam he is noted to have very cold right lower extremity which extends halfway up his calf.  Unable to find pulses via Doppler.  While in the emergency room patient started on a heparin infusion with bolus, pain medications ordered and lab work was drawn.  Vascular surgery plans on taking the patient to the vascular lab tomorrow on 09/28/2023 for right lower extremity angiogram with possible intervention and possible lysis catheter.  I long discussion this afternoon with the patient and his wife at the bedside concerning the procedure, benefits, risk, and complications.  Both verbalized her understanding and wished to  proceed as soon as possible.  I answered all her questions this afternoon.  Patient will be made n.p.o. after midnight tonight 09/28/2023 for procedure tomorrow.     I discussed the plan in detail with Dr. Festus Barren MD and he agrees with the plan.   [name] [facility] 09/27/2023 2:24 PM

## 2023-09-27 NOTE — ED Provider Notes (Signed)
[facility] Provider Note    Event Date/Time   First MD Initiated Contact with Patient 09/27/23 1256     (approximate)   History   Leg Swelling   HPI  Brendan Williams is a 62 y.o. 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  On review of records patient on 06/21/2023 had right transmetatarsal amputation.  He reports having about 1 week of painful cold right leg.  He was seen at the vascular office this and noted to have clear ischemia and recommended coming to the emergency room for angiogram, heparin.  Patient does report having some falls last fall was 2 days ago.  He states that he did not hit his head he.  He states it was just because the pain he landed on his butt.  He denies any confusion.  Wife reports he has been acting his normal self.   Physical Exam   Triage Vital Signs: ED Triage Vitals  Encounter Vitals Group     BP 09/27/23 1229 (!) 114/96     Systolic BP Percentile --      Diastolic BP Percentile --      Pulse Rate 09/27/23 1229 73     Resp 09/27/23 1229 18     Temp 09/27/23 1229 98.2 F (36.8 C)     Temp Source 09/27/23 1229 Oral     SpO2 09/27/23 1229 97 %     Weight 09/27/23 1227 175 lb (79.4 kg)     Height 09/27/23 1227 6\' 1"  (1.854 m)     Head Circumference --      Peak Flow --      Pain Score 09/27/23 1227 8     Pain Loc --      Pain Education --      Exclude from Growth Chart --     Most recent vital signs: Vitals:   09/27/23 1229  BP: (!) 114/96  Pulse: 73  Resp: 18  Temp: 98.2 F (36.8 C)  SpO2: 97%     General: Awake, no distress.  CV:  Good peripheral perfusion.  Resp:  Normal effort.  Abd:  No distention.  Other:  No hematomas noted to the forehead or the head.  He has a cold right leg with prior amputation noted   ED Results / Procedures / Treatments   Labs (all labs ordered are listed, but only abnormal results are displayed) Labs Reviewed  BASIC METABOLIC PANEL -  Abnormal; Notable for the following components:      Result Value   Sodium 131 (*)    Chloride 95 (*)    Glucose, Bld 108 (*)    Calcium 8.8 (*)    All other components within normal limits  CBC WITH DIFFERENTIAL/PLATELET - Abnormal; Notable for the following components:   WBC 11.0 (*)    RBC 3.99 (*)    Hemoglobin 11.9 (*)    HCT 36.4 (*)    Neutro Abs 7.8 (*)    Monocytes Absolute 1.7 (*)    All other components within normal limits  APTT - Abnormal; Notable for the following components:   aPTT 45 (*)    All other components within normal limits  HEPARIN LEVEL (UNFRACTIONATED) - Abnormal; Notable for the following components:   Heparin Unfractionated >1.10 (*)    All other components within normal limits  PROTIME-INR  APTT  TYPE AND SCREEN     EKG  My interpretation of EKG:  Normal sinus rhythm 65 without any ST elevation or T wave inversions, normal intervals  RADIOLOGY   PROCEDURES:  Critical Care performed: Yes, see critical care procedure note(s)  .Critical Care  Performed by: Concha Se, MD Authorized by: Concha Se, MD   Critical care provider statement:    Critical care time (minutes):  30   Critical care was necessary to treat or prevent imminent or life-threatening deterioration of the following conditions: ischemic foot.   Critical care was time spent personally by me on the following activities:  Development of treatment plan with patient or surrogate, discussions with consultants, evaluation of patient's response to treatment, examination of patient, ordering and review of laboratory studies, ordering and review of radiographic studies, ordering and performing treatments and interventions, pulse oximetry, re-evaluation of patient's condition and review of old charts    MEDICATIONS ORDERED IN ED: Medications  heparin bolus via infusion 2,700 Units (has no administration in time range)  heparin ADULT infusion 100 units/mL (25000 units/280mL)  (has no administration in time range)     IMPRESSION / MDM / ASSESSMENT AND PLAN / ED COURSE  I reviewed the triage vital signs and the nursing notes.   Patient's presentation is most consistent with acute presentation with potential threat to life or bodily function.   Patient comes in with concerns for ischemic right leg.  Patient has no pulse and his leg is mottled, cold to touch.  He denies any falls or hitting his head to suggest concern for starting him on heparin.  We discussed CT imaging but is opted to decline given he is adamant that he is acting his normal self last fall was 2 days ago and that he did not hit his head.  Denies any blood in the stool.  I did let Dr. Wyn Quaker from vascular know about patient.  The patient is on the cardiac monitor to evaluate for evidence of arrhythmia and/or significant heart rate changes.      FINAL CLINICAL IMPRESSION(S) / ED DIAGNOSES   Final diagnoses:  Acute lower limb ischemia     Rx / DC Orders   ED Discharge Orders     None        Note:  This document was prepared using Dragon voice recognition software and may include unintentional dictation errors.   Concha Se, MD 09/27/23 [phone]

## 2023-09-27 NOTE — Assessment & Plan Note (Signed)
Patient also had apixaban 5 mg twice daily.  I am unable to find the assessment/diagnosis for this, I suspect it is triple therapy treatment for severe PAD Discussed with pharmacy, patient last refilled the medication on 08/06/2023 and there are no more refills on this medication at this time. I have not resumed home apixaban on admission Recommend patient follow-up with vascular/PCP for clarification

## 2023-09-27 NOTE — H&P (Addendum)
History and Physical   Brendan Williams GNF:[id number] DOB: Dec 31, 1960 DOA: 09/27/2023  PCP: Gildardo Pounds, PA  Outpatient Specialists: Dr. Wyn Quaker Patient coming from: Right leg pain  I have personally briefly reviewed patient's old medical records in [facility] EMR.  Chief Concern:   HPI: [name] is a 62 year old male with history of severe PAD, former history of tobacco use, GERD, hyperlipidemia, neuropathy, who presents emergency department for chief concerns of right leg pain.  Patient was sent from outpatient vascular office.  Vitals in the ED showed temperature of 98.2, respiration rate of 16, heart rate 80, blood pressure initially 93/72, improved to 113/96, SpO2 97% on room air.  Serum sodium is 131, potassium 4.0, chloride 95, bicarb 29, BUN of 11, serum creatinine of 0.74, EGFR greater than 60, nonfasting blood glucose 108, WBC 11, hemoglobin 11.9, platelets of 312.  ED treatment: Heparin GTT. ---------------------------- At bedside, patient able to tell me his name, age, location, current calendar year.  He reports that his right leg has been hurting him for a while however it has been worsening over the last 3 days.  He reports the pain is a 10 out of 10.  Patient denies chest pain, shortness of breath, abdominal pain, dysuria, hematuria, diarrhea.  He denies trauma to his person.  He denies nausea, vomiting.  Social history: He lives at home with his wife and stepdaughter.  He is a former tobacco smoker, quitting 4 months ago.  At his peak he smoked 1 pack/day.  He endorses EtOH use, once to twice weekly.  Each time drinking about 6 beers.  He denies IV recreational drug use.  He endorses smoking marijuana occasionally.  ROS: Constitutional: no weight change, no fever ENT/Mouth: no sore throat, no rhinorrhea Eyes: no eye pain, no vision changes Cardiovascular: no chest pain, no dyspnea,  no edema, no palpitations Respiratory: no cough, no sputum, no  wheezing Gastrointestinal: no nausea, no vomiting, no diarrhea, no constipation Genitourinary: no urinary incontinence, no dysuria, no hematuria Musculoskeletal: no arthralgias, no myalgias, + right leg pain  Skin: + right foot coldness Neuro: + weakness, no loss of consciousness, no syncope Psych: no anxiety, no depression, + decrease appetite Heme/Lymph: no bruising, no bleeding  ED Course: Discussed with EDP, patient requiring hospitalization for chief concerns of right lower extremity limb ischemia.  Assessment/Plan  Principal Problem:   Acute lower limb ischemia Active Problems:   Tobacco use disorder   Atherosclerosis of native arteries of extremity with intermittent claudication (HCC)   PAD (peripheral artery disease) (HCC)   Critical limb ischemia of right lower extremity (HCC)   Chronic anticoagulation   Hyperlipidemia   Neuropathy   Elevated ETOH level   Assessment and Plan:  * Acute lower limb ischemia Continue heparin GGT Symptomatic support: Morphine 2 mg IV every 4 hours as needed for moderate and severe pain, 20 hours ordered; fentanyl 25 mcg IV every 4 hours as needed for severe pain not responsive to IV morphine. N.p.o. after midnight  Elevated ETOH level Patient denies history of withdrawal, seizure without alcohol Patient drinks approximately 12 drinks per week, counseled patient on cessation extensively  Neuropathy Home gabapentin 300 mg p.o. twice daily resumed  Hyperlipidemia Atorvastatin 40 mg nightly resumed  Chronic anticoagulation Patient also had apixaban 5 mg twice daily.  I am unable to find the assessment/diagnosis for this, I suspect it is triple therapy treatment for severe PAD Discussed with pharmacy, patient last refilled the medication on 08/06/2023 and  there are no more refills on this medication at this time. I have not resumed home apixaban on admission Recommend patient follow-up with vascular/PCP for clarification  PAD  (peripheral artery disease) (HCC) Extensive counseling with patient to see the cardiology specialist that PCP sent him to for coronary artery disease evaluation given high risk in setting of PAD with limb ischemia Patient endorses understanding and compliance Resume home aspirin 81 mg daily, clopidogrel 75 mg daily for 09/28/2023  Tobacco use disorder Patient quit smoking 4 months ago  Chart reviewed.   DVT prophylaxis: Heparin GTT Code Status: Full code Diet: Heart healthy; n.p.o. after midnight Family Communication: No Disposition Plan: Pending clinical course Consults called: Vascular, pharmacy Admission status: Telemetry cardiac, inpatient  Past Medical History:  Diagnosis Date   Anxiety    situational   Asthma    as a child   Atherosclerosis of native arteries of extremity with intermittent claudication (HCC)    Cellulitis and abscess of toe of right foot    Critical limb ischemia of right lower extremity (HCC)    Current smoker    Quit 04/16/23   Peripheral arterial disease (HCC)    Past Surgical History:  Procedure Laterality Date   AMPUTATION TOE Right 12/02/2019   Procedure: AMPUTATION RIGHT 5TH TOE;  Surgeon: Rosetta Posner, DPM;  Location: [facility];  Service: Podiatry;  Laterality: Right;   APPLICATION OF WOUND VAC Bilateral 11/28/2019   Procedure: APPLICATION OF WOUND VAC;  Surgeon: Annice Needy, MD;  Location: [facility];  Service: Vascular;  Laterality: Bilateral;  Prevena    BACK SURGERY     lower ruptured disk   CENTRAL VENOUS CATHETER INSERTION  11/28/2019   Procedure: INSERTION CENTRAL LINE ADULT;  Surgeon: Annice Needy, MD;  Location: [facility];  Service: Vascular;;   ENDARTERECTOMY FEMORAL Bilateral 11/28/2019   Procedure: ENDARTERECTOMY FEMORAL;  Surgeon: Annice Needy, MD;  Location: [facility];  Service: Vascular;  Laterality: Bilateral;   ENDARTERECTOMY FEMORAL Right 05/23/2023   Procedure: ENDARTERECTOMY FEMORAL REDO;  Surgeon: Annice Needy, MD;  Location:  [facility];  Service: Vascular;  Laterality: Right;   ENDOVASCULAR REPAIR/STENT GRAFT Right 05/23/2023   Procedure: ENDOVASCULAR REPAIR/STENT GRAFT;  Surgeon: Annice Needy, MD;  Location: [facility];  Service: Vascular;  Laterality: Right;  Right SFA Stents   LOWER EXTREMITY ANGIOGRAPHY Right 11/14/2019   Procedure: LOWER EXTREMITY ANGIOGRAPHY;  Surgeon: Annice Needy, MD;  Location: [facility];  Service: Cardiovascular;  Laterality: Right;   LOWER EXTREMITY ANGIOGRAPHY Right 12/19/2021   Procedure: Lower Extremity Angiography;  Surgeon: Annice Needy, MD;  Location: [facility];  Service: Cardiovascular;  Laterality: Right;   LOWER EXTREMITY ANGIOGRAPHY Right 01/30/2022   Procedure: Lower Extremity Angiography;  Surgeon: Annice Needy, MD;  Location: [facility];  Service: Cardiovascular;  Laterality: Right;   LOWER EXTREMITY ANGIOGRAPHY Right 05/21/2023   Procedure: Lower Extremity Angiography;  Surgeon: Annice Needy, MD;  Location: [facility];  Service: Cardiovascular;  Laterality: Right;   MECHANICAL THROMBECTOMY WITH AORTOGRAM AND INTERVENTION  05/23/2023   Procedure: MECHANICAL THROMBECTOMY right popliteal artery;  Surgeon: Annice Needy, MD;  Location: [facility];  Service: Vascular;;   STOMACH SURGERY     exploratory   TONSILECTOMY, ADENOIDECTOMY, BILATERAL MYRINGOTOMY AND TUBES     TONSILLECTOMY     TRANSMETATARSAL AMPUTATION Right 06/21/2023   Procedure: TRANSMETATARSAL AMPUTATION;  Surgeon: Rosetta Posner, DPM;  Location: [facility];  Service:  Orthopedics/Podiatry;  Laterality: Right;   Social History:  reports that he quit smoking about 5 months ago. His smoking use included cigarettes. He started smoking about 40 years ago. He has a 40.5 pack-year smoking history. He has never used smokeless tobacco. He reports current alcohol use of about 5.0 standard drinks of alcohol per week. He reports current drug use. Frequency: 3.00 times per week. Drug:  Marijuana.  No Known Allergies Family History  Problem Relation Age of Onset   Leukemia Mother    Heart disease Father        a. CABG   Heart disease Sister        a. MVR   Cancer Sister    Cancer Brother    CAD Brother        a. CABG   Family history: Family history reviewed and not pertinent  Prior to Admission medications   Medication Sig Start Date End Date Taking? Authorizing Provider  amoxicillin-clavulanate (AUGMENTIN) 875-125 MG tablet Take 1 tablet by mouth 2 (two) times daily. 06/21/23   Rosetta Posner, DPM  apixaban (ELIQUIS) 5 MG TABS tablet Take 1 tablet (5 mg total) by mouth 2 (two) times daily. 06/21/23 08/20/23  Rosetta Posner, DPM  aspirin EC 81 MG tablet Take 1 tablet (81 mg total) by mouth daily. 06/21/23   Rosetta Posner, DPM  atorvastatin (LIPITOR) 40 MG tablet Take 1 tablet (40 mg total) by mouth daily. 05/30/23 07/29/23  Lonia Blood, MD  clopidogrel (PLAVIX) 75 MG tablet Take 1 tablet (75 mg total) by mouth daily. 06/21/23   Rosetta Posner, DPM  dexlansoprazole (DEXILANT) 60 MG capsule Take 60 mg by mouth as needed.    [provider]  gabapentin (NEURONTIN) 300 MG capsule Take 1 capsule (300 mg total) by mouth 2 (two) times daily. 05/30/23 07/29/23  Lonia Blood, MD  leptospermum manuka honey (MEDIHONEY) PSTE paste Apply 1 Application topically daily. 06/20/23   Georgiana Spinner, NP  leptospermum manuka honey (MEDIHONEY) PSTE paste Apply 1 Application topically daily. 07/18/23   Georgiana Spinner, NP  ondansetron (ZOFRAN) 4 MG tablet Take 1 tablet (4 mg total) by mouth every 8 (eight) hours as needed for nausea or vomiting. 06/21/23   Rosetta Posner, DPM  oxyCODONE-acetaminophen (PERCOCET) 7.5-325 MG tablet Take 1 tablet by mouth every 4 (four) hours as needed for severe pain. 06/21/23 06/20/24  Rosetta Posner, DPM  sulfamethoxazole-trimethoprim (BACTRIM DS) 800-160 MG tablet Take 1 tablet by mouth 2 (two) times daily. Patient not taking: Reported on 09/27/2023 06/20/23    Georgiana Spinner, NP   Physical Exam: Vitals:   09/27/23 1227 09/27/23 1229  BP:  (!) 114/96  Pulse:  73  Resp:  18  Temp:  98.2 F (36.8 C)  TempSrc:  Oral  SpO2:  97%  Weight: 79.4 kg   Height: 6\' 1"  (1.854 m)    Constitutional: appears age-appropriate, NAD, calm Eyes: PERRL, lids and conjunctivae normal ENMT: Mucous membranes are moist. Posterior pharynx clear of any exudate or lesions. Age-appropriate dentition. Hearing appropriate Neck: normal, supple, no masses, no thyromegaly Respiratory: clear to auscultation bilaterally, no wheezing, no crackles. Normal respiratory effort. No accessory muscle use.  Cardiovascular: Regular rate and rhythm, no murmurs / rubs / gallops. No extremity edema. 2+ pedal pulses on left leg; no pulse palpated on right leg. No carotid bruits.  Abdomen: no tenderness, no masses palpated, no hepatosplenomegaly. Bowel sounds positive.  Musculoskeletal: no clubbing / cyanosis.  Right TMA. Good ROM, no  contractures, no atrophy. Normal muscle tone.  Skin: Right lower extremity foot up to mid leg with cold blue mottling and severe tenderness to the touch Neurologic: Sensation intact. Strength 5/5 in all 4.  Psychiatric: Normal judgment and insight. Alert and oriented x 3. Normal mood.   EKG: independently reviewed, showing sinus rhythm with rate of 65, QTc 407  Chest x-ray on Admission: Not indicated at this time  VAS [place] ABI WITH/WO TBI Result Date: 09/27/2023  LOWER EXTREMITY DOPPLER STUDY Patient Name:  Brendan Williams  Date of Exam:   09/27/2023 Medical Rec #: [id number]         Accession #:    [id number] Date of Birth: [birth year]          Patient Gender: M Patient Age:   89 years Exam Location:  [facility] Procedure:      VAS [place] ABI WITH/WO TBI Referring Phys: Festus Barren --------------------------------------------------------------------------------  Indications: Claudication, rest pain, peripheral artery disease, and Severe pain               right leg. High Risk Factors: Past history of smoking. Other Factors: Right trans metatarsal amputation.  Vascular Interventions: 05/28/2023: right SFA atherectomy, stent, ATA PTA                          11/14/19: Bilateral CIA and EIA Stents.                          11/28/2019: Aorta, Bilateral CIA, Bilateral EIA and                         Bilateral SFA Stents with Right ATA Angioplasty.                         01/2022 rt thrombectomy and new stents sfa pop. Performing Technologist: Hardie Lora RVT  Examination Guidelines: A complete evaluation includes at minimum, Doppler waveform signals and systolic blood pressure reading at the level of bilateral brachial, anterior tibial, and posterior tibial arteries, when vessel segments are accessible. Bilateral testing is considered an integral part of a complete examination. Photoelectric Plethysmograph (PPG) waveforms and toe systolic pressure readings are included as required and additional duplex testing as needed. Limited examinations for reoccurring indications may be performed as noted.  ABI Findings: +---------+------------------+-----+--------+----------+ Right    Rt Pressure (mmHg)IndexWaveformComment    +---------+------------------+-----+--------+----------+ Brachial 88                                        +---------+------------------+-----+--------+----------+ PTA      0                 0.00 absent             +---------+------------------+-----+--------+----------+ PERO     0                 0.00 absent             +---------+------------------+-----+--------+----------+ DP       0                 0.00 absent             +---------+------------------+-----+--------+----------+ Great Toe  amputation +---------+------------------+-----+--------+----------+ +---------+------------------+-----+----------+-------+ Left     Lt Pressure (mmHg)IndexWaveform  Comment  +---------+------------------+-----+----------+-------+ Brachial 97                                       +---------+------------------+-----+----------+-------+ PTA      81                0.84 monophasic        +---------+------------------+-----+----------+-------+ DP       78                0.80 monophasic        +---------+------------------+-----+----------+-------+ Great Toe28                0.29                   +---------+------------------+-----+----------+-------+ +-------+-----------+-----------+------------+------------+ ABI/TBIToday's ABIToday's TBIPrevious ABIPrevious TBI +-------+-----------+-----------+------------+------------+ Right  0.0        amputation 1.12        1.09         +-------+-----------+-----------+------------+------------+ Left   0.84       0.29       0.90        0.60         +-------+-----------+-----------+------------+------------+ No flow seen in right anterior tibial, posterior tibial, or peroneal arteries by duplex. Limited imaging showed right SFA and popliteal artery occlusion. Right ABIs appear decreased compared to prior study on 07/25/2022. Left ABIs appear essentially unchanged compared to prior study on 07/25/2022. Limited imaging showed occluded right SFA and popliteal arteries.  Summary: Right: Resting right ankle-brachial index indicates critical limb ischemia. Left: Resting left ankle-brachial index indicates mild left lower extremity arterial disease. The left toe-brachial index is abnormal. *See table(s) above for measurements and observations.  Electronically signed by Levora Dredge MD on 09/27/2023 at 4:45:04 PM.    Final    Labs on Admission: I have personally reviewed following labs  CBC: Recent Labs  Lab 09/27/23 1231  WBC 11.0*  NEUTROABS 7.8*  HGB 11.9*  HCT 36.4*  MCV 91.2  PLT 312   Basic Metabolic Panel: Recent Labs  Lab 09/27/23 1231  NA 131*  K 4.0  CL 95*  CO2 29  GLUCOSE 108*  BUN  11  CREATININE 0.74  CALCIUM 8.8*   GFR: Estimated Creatinine Clearance: 106.1 mL/min (by C-G formula based on SCr of 0.74 mg/dL).  Coagulation Profile: Recent Labs  Lab 09/27/23 1231  INR 1.1   This document was prepared using Dragon Voice Recognition software and may include unintentional dictation errors.  Dr. Sedalia Muta [facility]  If 7PM-7AM, please contact overnight-coverage provider If 7AM-7PM, please contact day attending provider [email or web address]  09/27/2023, 5:16 PM

## 2023-09-27 NOTE — Assessment & Plan Note (Signed)
Home gabapentin 300 mg p.o. twice daily resumed

## 2023-09-27 NOTE — Assessment & Plan Note (Addendum)
Extensive counseling with patient to see the cardiology specialist that PCP sent him to for coronary artery disease evaluation given high risk in setting of PAD with limb ischemia Patient endorses understanding and compliance Resume home aspirin 81 mg daily, clopidogrel 75 mg daily for 09/28/2023

## 2023-09-27 NOTE — Assessment & Plan Note (Signed)
Patient quit smoking 4 months ago

## 2023-09-27 NOTE — Hospital Course (Signed)
Brendan Williams is a 62 year old male with history of severe PAD, former history of tobacco use, GERD, hyperlipidemia, neuropathy, who presents emergency department for chief concerns of right leg pain.  Patient was sent from outpatient vascular office.  Vitals in the ED showed temperature of 98.2, respiration rate of 16, heart rate 80, blood pressure initially 93/72, improved to 113/96, SpO2 97% on room air.  Serum sodium is 131, potassium 4.0, chloride 95, bicarb 29, BUN of 11, serum creatinine of 0.74, EGFR greater than 60, nonfasting blood glucose 108, WBC 11, hemoglobin 11.9, platelets of 312.  ED treatment: Heparin GTT.

## 2023-09-27 NOTE — ED Triage Notes (Addendum)
Pt sts that he started to have pain in the right left and foot for the last week. Pt leg is red and cool to the touch. Provider Poggi,PA at the bedside in triage. Pt lower ext skin is still blanchable at this time.

## 2023-09-27 NOTE — Assessment & Plan Note (Signed)
Patient denies history of withdrawal, seizure without alcohol Patient drinks approximately 12 drinks per week, counseled patient on cessation extensively

## 2023-09-27 NOTE — ED Provider Triage Note (Signed)
Emergency Medicine Provider Triage Evaluation Note  Brendan Williams , a 62 y.o. male  was evaluated in triage.  Pt complains of right leg pain. Had stents placed 4 months ago, now with new pain to right leg. Saw Fallon/Dew today who thinks its blocked again.  Spoke with Vivia Birmingham, does NOT want CTA in ER, wants heparin and likely angiogram tomorrow  Review of Systems  Positive: Right foot pain  Negative: numbness  Physical Exam  There were no vitals taken for this visit. Gen:   Awake, no distress   Resp:  Normal effort  MSK:   Moves extremities without difficulty  Other:  Right foot with midfoot amputation, cool, cannot palpate pulse  Medical Decision Making  Medically screening exam initiated at 12:25 PM.  Appropriate orders placed.  FARAAZ STEC was informed that the remainder of the evaluation will be completed by another provider, this initial triage assessment does not replace that evaluation, and the importance of remaining in the ED until their evaluation is complete.     Jackelyn Hoehn, PA-C 09/27/23 1234

## 2023-09-27 NOTE — Consult Note (Signed)
Pharmacy Consult Note - Anticoagulation  Pharmacy Consult for heparin Indication:  arterial occlusion  PATIENT MEASUREMENTS: Height: 6\' 1"  (185.4 cm) Weight: 79.4 kg (175 lb) IBW/kg (Calculated) : 79.9 HEPARIN DW (KG): 79.4  VITAL SIGNS: Temp: 98.2 F (36.8 C) (12/12 1229) Temp Source: Oral (12/12 1229) BP: 114/96 (12/12 1229) Pulse Rate: 73 (12/12 1229)  Recent Labs    09/27/23 1231  HGB 11.9*  HCT 36.4*  PLT 312  APTT 45*  LABPROT 14.3  INR 1.1  HEPARINUNFRC >1.10*  CREATININE 0.74    Estimated Creatinine Clearance: 106.1 mL/min (by C-G formula based on SCr of 0.74 mg/dL).  PAST MEDICAL HISTORY: Past Medical History:  Diagnosis Date   Anxiety    situational   Asthma    as a child   Atherosclerosis of native arteries of extremity with intermittent claudication (HCC)    Cellulitis and abscess of toe of right foot    Critical limb ischemia of right lower extremity (HCC)    Current smoker    Quit 04/16/23   Peripheral arterial disease [facility])     ASSESSMENT: 62 y.o. male with PMH including PAD is presenting with arterial occlusion. Patient endorses pain in right leg; reportedly the leg is red and cool to the touch  No imaging information available yet. Patient is on chronic anticoagulation with Eliquis per chart review. Questionable adherence to anticoagulation in the past; will order baseline anti-Xa level to check for potential false elevation if Eliquis was taken recently. Pharmacy has been consulted to initiate and manage heparin intravenous infusion.  Update: Baseline anti-Xa level came back elevated, not correlating with aPTT, suggesting that patient has been adherent to Eliquis.  Pertinent medications: Eliquis 5 mg twice daily Time of last dose unknown  Goal(s) of therapy: Heparin level 0.3 - 0.7 units/mL aPTT 66 - 102 seconds Monitor platelets by anticoagulation protocol: Yes   Baseline anticoagulation labs: Recent Labs    09/27/23 1231  APTT 45*   INR 1.1  HGB 11.9*  PLT 312    Date Time aPTT/HL Rate/Comment   PLAN: Give 2700 units bolus x1; then start heparin infusion at 1300 units/hour. Check aPTT in 6 hours after heparin starts Continue to titrate by aPTT until heparin level and aPTT correlate and/or Eliquis washes out, then titrate by heparin level alone. Check heparin level with next AM labs. Continue to monitor CBC daily while on heparin infusion.  Will M. Dareen Piano, PharmD Clinical Pharmacist 09/27/2023 1:16 PM

## 2023-09-27 NOTE — Consult Note (Signed)
Pharmacy Consult Note - Anticoagulation  Pharmacy Consult for heparin Indication:  arterial occlusion  PATIENT MEASUREMENTS: Height: 6\' 1"  (185.4 cm) Weight: 79.4 kg (175 lb) IBW/kg (Calculated) : 79.9 HEPARIN DW (KG): 79.4  VITAL SIGNS: Temp: 100.2 F (37.9 C) (12/12 1856) Temp Source: Oral (12/12 1856) BP: 138/100 (12/12 1847) Pulse Rate: 67 (12/12 1847)  Recent Labs    09/27/23 1231 09/27/23 2133  HGB 11.9*  --   HCT 36.4*  --   PLT 312  --   APTT 45* 128*  LABPROT 14.3  --   INR 1.1  --   HEPARINUNFRC >1.10*  --   CREATININE 0.74  --     Estimated Creatinine Clearance: 106.1 mL/min (by C-G formula based on SCr of 0.74 mg/dL).  PAST MEDICAL HISTORY: Past Medical History:  Diagnosis Date   Anxiety    situational   Asthma    as a child   Atherosclerosis of native arteries of extremity with intermittent claudication (HCC)    Cellulitis and abscess of toe of right foot    Critical limb ischemia of right lower extremity (HCC)    Current smoker    Quit 04/16/23   Peripheral arterial disease [facility])     ASSESSMENT: 62 y.o. male with PMH including PAD is presenting with arterial occlusion. Patient endorses pain in right leg; reportedly the leg is red and cool to the touch  No imaging information available yet. Patient is on chronic anticoagulation with Eliquis per chart review. Questionable adherence to anticoagulation in the past; will order baseline anti-Xa level to check for potential false elevation if Eliquis was taken recently. Pharmacy has been consulted to initiate and manage heparin intravenous infusion.  Update: Baseline anti-Xa level came back elevated, not correlating with aPTT, suggesting that patient has been adherent to Eliquis.  Pertinent medications: Eliquis 5 mg twice daily Time of last dose unknown  Goal(s) of therapy: Heparin level 0.3 - 0.7 units/mL aPTT 66 - 102 seconds Monitor platelets by anticoagulation protocol: Yes   Baseline  anticoagulation labs: Recent Labs    09/27/23 1231 09/27/23 2133  APTT 45* 128*  INR 1.1  --   HGB 11.9*  --   PLT 312  --     Date Time aPTT/HL Rate/Comment 12/12 2133 128/--  Supratherapeutic@1300  units/hr  PLAN: Decrease heparin infusion rate to 1100 units/hr Check aPTT and HL with AM labs Continue to titrate by aPTT until heparin level and aPTT correlate and/or Eliquis washes out, then titrate by heparin level alone. Continue to monitor CBC daily while on heparin infusion.  Bettey Costa, PharmD Clinical Pharmacist 09/27/2023 10:27 PM

## 2023-09-27 NOTE — H&P (View-Only) (Signed)
 Subjective:    Patient ID: Brendan Williams, male    DOB: 1960-12-15, 62 y.o.   MRN: [id number] Chief Complaint  Patient presents with   Follow-up    3 week with ABI    Brendan Williams is a 62 year old male who presents today for follow-up evaluation of his peripheral arterial disease.  The patient underwent extensive intervention and missed a follow-up and wound check.  He is right groin wound from his SFA endarterectomy has finally healed.  However he notes that over the last week or so he has had intense pain in his lower leg and residual limb.  He had a TMA done and this is mostly healed but there are still small area of opening.  His foot today is pale and it is ice cold to touch.  He notes that he is beginning to have some motor difficulty in the limb.  Today his noninvasive studies show a ABI of 0.  He has absent tibial waveforms.  Additional limited duplex shows occlusion of his popliteal and SFA.    Review of Systems  Skin:  Positive for color change, pallor and wound.  Neurological:  Positive for weakness.  All other systems reviewed and are negative.      Objective:   Physical Exam Vitals reviewed.  HENT:     Head: Normocephalic.  Cardiovascular:     Rate and Rhythm: Normal rate.     Pulses: Normal pulses.  Pulmonary:     Effort: Pulmonary effort is normal.  Skin:    General: Skin is cool and dry.     Capillary Refill: Capillary refill takes more than 3 seconds.     Coloration: Skin is pale.  Neurological:     Mental Status: He is alert.     Gait: Gait abnormal.  Psychiatric:        Mood and Affect: Mood normal.        Behavior: Behavior normal.        Thought Content: Thought content normal.        Judgment: Judgment normal.     BP 93/72   Pulse 80   Resp 16   Past Medical History:  Diagnosis Date   Anxiety    situational   Asthma    as a child   Atherosclerosis of native arteries of extremity with intermittent claudication (HCC)    Cellulitis and  abscess of toe of right foot    Critical limb ischemia of right lower extremity (HCC)    Current smoker    Quit 04/16/23   Peripheral arterial disease (HCC)     Social History   Socioeconomic History   Marital status: Married    Spouse name: Gammell,BRENDA (Spouse)   Number of children: Not on file   Years of education: Not on file   Highest education level: Not on file  Occupational History   Not on file  Tobacco Use   Smoking status: Former    Current packs/day: 0.00    Average packs/day: 1 pack/day for 40.5 years (40.5 ttl pk-yrs)    Types: Cigarettes    Start date: 28    Quit date: 04/16/2023    Years since quitting: 0.4   Smokeless tobacco: Never  Vaping Use   Vaping status: Never Used  Substance and Sexual Activity   Alcohol use: Yes    Alcohol/week: 5.0 standard drinks of alcohol    Types: 5 Cans of beer per week    Comment: 4-5 beers a  week   Drug use: Yes    Frequency: 3.0 times per week    Types: Marijuana    Comment: 3-4 times a week   Sexual activity: Yes  Other Topics Concern   Not on file  Social History Narrative   Live in private residence with wife, Steward Drone. Had grandson and step daughter in the home   Social Drivers of Health   Financial Resource Strain: Not on file  Food Insecurity: No Food Insecurity (05/18/2023)   Hunger Vital Sign    Worried About Running Out of Food in the Last Year: Never true    Ran Out of Food in the Last Year: Never true  Transportation Needs: No Transportation Needs (05/18/2023)   PRAPARE - Administrator, Civil Service (Medical): No    Lack of Transportation (Non-Medical): No  Physical Activity: Not on file  Stress: Not on file  Social Connections: Not on file  Intimate Partner Violence: Patient Unable To Answer (05/18/2023)   Humiliation, Afraid, Rape, and Kick questionnaire    Fear of Current or Ex-Partner: Patient unable to answer    Emotionally Abused: Patient unable to answer    Physically Abused:  Patient unable to answer    Sexually Abused: Patient unable to answer    Past Surgical History:  Procedure Laterality Date   AMPUTATION TOE Right 12/02/2019   Procedure: AMPUTATION RIGHT 5TH TOE;  Surgeon: Rosetta Posner, DPM;  Location: [facility];  Service: Podiatry;  Laterality: Right;   APPLICATION OF WOUND VAC Bilateral 11/28/2019   Procedure: APPLICATION OF WOUND VAC;  Surgeon: Annice Needy, MD;  Location: [facility];  Service: Vascular;  Laterality: Bilateral;  Prevena    BACK SURGERY     lower ruptured disk   CENTRAL VENOUS CATHETER INSERTION  11/28/2019   Procedure: INSERTION CENTRAL LINE ADULT;  Surgeon: Annice Needy, MD;  Location: [facility];  Service: Vascular;;   ENDARTERECTOMY FEMORAL Bilateral 11/28/2019   Procedure: ENDARTERECTOMY FEMORAL;  Surgeon: Annice Needy, MD;  Location: [facility];  Service: Vascular;  Laterality: Bilateral;   ENDARTERECTOMY FEMORAL Right 05/23/2023   Procedure: ENDARTERECTOMY FEMORAL REDO;  Surgeon: Annice Needy, MD;  Location: [facility];  Service: Vascular;  Laterality: Right;   ENDOVASCULAR REPAIR/STENT GRAFT Right 05/23/2023   Procedure: ENDOVASCULAR REPAIR/STENT GRAFT;  Surgeon: Annice Needy, MD;  Location: [facility];  Service: Vascular;  Laterality: Right;  Right SFA Stents   LOWER EXTREMITY ANGIOGRAPHY Right 11/14/2019   Procedure: LOWER EXTREMITY ANGIOGRAPHY;  Surgeon: Annice Needy, MD;  Location: [facility];  Service: Cardiovascular;  Laterality: Right;   LOWER EXTREMITY ANGIOGRAPHY Right 12/19/2021   Procedure: Lower Extremity Angiography;  Surgeon: Annice Needy, MD;  Location: [facility];  Service: Cardiovascular;  Laterality: Right;   LOWER EXTREMITY ANGIOGRAPHY Right 01/30/2022   Procedure: Lower Extremity Angiography;  Surgeon: Annice Needy, MD;  Location: [facility];  Service: Cardiovascular;  Laterality: Right;   LOWER EXTREMITY ANGIOGRAPHY Right 05/21/2023   Procedure: Lower Extremity Angiography;  Surgeon: Annice Needy, MD;  Location: [facility];  Service: Cardiovascular;  Laterality: Right;   MECHANICAL THROMBECTOMY WITH AORTOGRAM AND INTERVENTION  05/23/2023   Procedure: MECHANICAL THROMBECTOMY right popliteal artery;  Surgeon: Annice Needy, MD;  Location: [facility];  Service: Vascular;;   STOMACH SURGERY     exploratory   TONSILECTOMY, ADENOIDECTOMY, BILATERAL MYRINGOTOMY AND TUBES     TONSILLECTOMY     TRANSMETATARSAL  AMPUTATION Right 06/21/2023   Procedure: TRANSMETATARSAL AMPUTATION;  Surgeon: Rosetta Posner, DPM;  Location: [facility];  Service: Orthopedics/Podiatry;  Laterality: Right;    Family History  Problem Relation Age of Onset   Leukemia Mother    Heart disease Father        a. CABG   Heart disease Sister        a. MVR   Cancer Sister    Cancer Brother    CAD Brother        a. CABG    No Known Allergies     Latest Ref Rng & Units 06/06/2023   11:47 AM 05/26/2023    5:29 AM 05/25/2023    3:36 AM  CBC  WBC 4.0 - 10.5 K/uL 13.5  9.7  11.6   Hemoglobin 13.0 - 17.0 g/dL 16.1  09.6  04.5   Hematocrit 39.0 - 52.0 % 37.8  30.2  35.3   Platelets 150 - 400 K/uL 644  311  324       CMP     Component Value Date/Time   NA 136 06/06/2023 1147   K 4.6 06/06/2023 1147   CL 101 06/06/2023 1147   CO2 25 06/06/2023 1147   GLUCOSE 119 (H) 06/06/2023 1147   BUN 10 06/06/2023 1147   CREATININE 0.86 06/06/2023 1147   CALCIUM 9.2 06/06/2023 1147   PROT 7.9 06/06/2023 1147   ALBUMIN 3.5 06/06/2023 1147   AST 16 06/06/2023 1147   ALT 16 06/06/2023 1147   ALKPHOS 78 06/06/2023 1147   BILITOT 0.7 06/06/2023 1147   GFRNONAA >60 06/06/2023 1147     No results found.     Assessment & Plan:   1. Critical limb ischemia of right lower extremity (HCC) (Primary) Today the patient presented with a very cold and painful right lower extremity.  His studies revealed that he has a ABI of 0 with noted occlusion of the SFA and popliteal artery.  He is mostly healed from his  recent TMA but there is still small area.  Given the fact that he is and extreme pain if he is clearly ischemic, we have taken the patient directly to the emergency room.  I have discussed with the triage nurse as well as one of the emergency room physicians.  Will plan on patient the patient on a heparin drip with the hope for angiogram.  We have discussed the risk, benefits and alternatives and he is agreeable to proceed with this.  He also understands that he may require a lysis catheter as he has done previously.  2. Tobacco use disorder Patient stopped in August but does continue to smoke weed which can also have inflammatory component   Current Outpatient Medications on File Prior to Visit  Medication Sig Dispense Refill   amoxicillin-clavulanate (AUGMENTIN) 875-125 MG tablet Take 1 tablet by mouth 2 (two) times daily. 14 tablet 0   aspirin EC 81 MG tablet Take 1 tablet (81 mg total) by mouth daily.     clopidogrel (PLAVIX) 75 MG tablet Take 1 tablet (75 mg total) by mouth daily.     dexlansoprazole (DEXILANT) 60 MG capsule Take 60 mg by mouth as needed.     leptospermum manuka honey (MEDIHONEY) PSTE paste Apply 1 Application topically daily. 44 mL 1   leptospermum manuka honey (MEDIHONEY) PSTE paste Apply 1 Application topically daily. 44 mL 3   ondansetron (ZOFRAN) 4 MG tablet Take 1 tablet (4 mg total) by mouth every 8 (eight)  hours as needed for nausea or vomiting. 20 tablet 0   oxyCODONE-acetaminophen (PERCOCET) 7.5-325 MG tablet Take 1 tablet by mouth every 4 (four) hours as needed for severe pain. 20 tablet 0   apixaban (ELIQUIS) 5 MG TABS tablet Take 1 tablet (5 mg total) by mouth 2 (two) times daily.     atorvastatin (LIPITOR) 40 MG tablet Take 1 tablet (40 mg total) by mouth daily. 30 tablet 1   gabapentin (NEURONTIN) 300 MG capsule Take 1 capsule (300 mg total) by mouth 2 (two) times daily. 60 capsule 1   sulfamethoxazole-trimethoprim (BACTRIM DS) 800-160 MG tablet Take 1 tablet  by mouth 2 (two) times daily. (Patient not taking: Reported on 09/27/2023) 20 tablet 0   No current facility-administered medications on file prior to visit.    There are no Patient Instructions on file for this visit. No follow-ups on file.   Georgiana Spinner, NP

## 2023-09-27 NOTE — Assessment & Plan Note (Signed)
Continue heparin GGT Symptomatic support: Morphine 2 mg IV every 4 hours as needed for moderate and severe pain, 20 hours ordered; fentanyl 25 mcg IV every 4 hours as needed for severe pain not responsive to IV morphine. N.p.o. after midnight

## 2023-09-27 NOTE — Progress Notes (Signed)
Subjective:    Patient ID: Brendan Williams, male    DOB: 1960-12-15, 62 y.o.   MRN: [id number] Chief Complaint  Patient presents with   Follow-up    3 week with ABI    Brendan Williams is a 62 year old male who presents today for follow-up evaluation of his peripheral arterial disease.  The patient underwent extensive intervention and missed a follow-up and wound check.  He is right groin wound from his SFA endarterectomy has finally healed.  However he notes that over the last week or so he has had intense pain in his lower leg and residual limb.  He had a TMA done and this is mostly healed but there are still small area of opening.  His foot today is pale and it is ice cold to touch.  He notes that he is beginning to have some motor difficulty in the limb.  Today his noninvasive studies show a ABI of 0.  He has absent tibial waveforms.  Additional limited duplex shows occlusion of his popliteal and SFA.    Review of Systems  Skin:  Positive for color change, pallor and wound.  Neurological:  Positive for weakness.  All other systems reviewed and are negative.      Objective:   Physical Exam Vitals reviewed.  HENT:     Head: Normocephalic.  Cardiovascular:     Rate and Rhythm: Normal rate.     Pulses: Normal pulses.  Pulmonary:     Effort: Pulmonary effort is normal.  Skin:    General: Skin is cool and dry.     Capillary Refill: Capillary refill takes more than 3 seconds.     Coloration: Skin is pale.  Neurological:     Mental Status: He is alert.     Gait: Gait abnormal.  Psychiatric:        Mood and Affect: Mood normal.        Behavior: Behavior normal.        Thought Content: Thought content normal.        Judgment: Judgment normal.     BP 93/72   Pulse 80   Resp 16   Past Medical History:  Diagnosis Date   Anxiety    situational   Asthma    as a child   Atherosclerosis of native arteries of extremity with intermittent claudication (HCC)    Cellulitis and  abscess of toe of right foot    Critical limb ischemia of right lower extremity (HCC)    Current smoker    Quit 04/16/23   Peripheral arterial disease (HCC)     Social History   Socioeconomic History   Marital status: Married    Spouse name: Brendan Williams,BRENDA (Spouse)   Number of children: Not on file   Years of education: Not on file   Highest education level: Not on file  Occupational History   Not on file  Tobacco Use   Smoking status: Former    Current packs/day: 0.00    Average packs/day: 1 pack/day for 40.5 years (40.5 ttl pk-yrs)    Types: Cigarettes    Start date: 28    Quit date: 04/16/2023    Years since quitting: 0.4   Smokeless tobacco: Never  Vaping Use   Vaping status: Never Used  Substance and Sexual Activity   Alcohol use: Yes    Alcohol/week: 5.0 standard drinks of alcohol    Types: 5 Cans of beer per week    Comment: 4-5 beers a  week   Drug use: Yes    Frequency: 3.0 times per week    Types: Marijuana    Comment: 3-4 times a week   Sexual activity: Yes  Other Topics Concern   Not on file  Social History Narrative   Live in private residence with wife, Steward Drone. Had grandson and step daughter in the home   Social Drivers of Health   Financial Resource Strain: Not on file  Food Insecurity: No Food Insecurity (05/18/2023)   Hunger Vital Sign    Worried About Running Out of Food in the Last Year: Never true    Ran Out of Food in the Last Year: Never true  Transportation Needs: No Transportation Needs (05/18/2023)   PRAPARE - Administrator, Civil Service (Medical): No    Lack of Transportation (Non-Medical): No  Physical Activity: Not on file  Stress: Not on file  Social Connections: Not on file  Intimate Partner Violence: Patient Unable To Answer (05/18/2023)   Humiliation, Afraid, Rape, and Kick questionnaire    Fear of Current or Ex-Partner: Patient unable to answer    Emotionally Abused: Patient unable to answer    Physically Abused:  Patient unable to answer    Sexually Abused: Patient unable to answer    Past Surgical History:  Procedure Laterality Date   AMPUTATION TOE Right 12/02/2019   Procedure: AMPUTATION RIGHT 5TH TOE;  Surgeon: Rosetta Posner, DPM;  Location: [facility];  Service: Podiatry;  Laterality: Right;   APPLICATION OF WOUND VAC Bilateral 11/28/2019   Procedure: APPLICATION OF WOUND VAC;  Surgeon: Annice Needy, MD;  Location: [facility];  Service: Vascular;  Laterality: Bilateral;  Prevena    BACK SURGERY     lower ruptured disk   CENTRAL VENOUS CATHETER INSERTION  11/28/2019   Procedure: INSERTION CENTRAL LINE ADULT;  Surgeon: Annice Needy, MD;  Location: [facility];  Service: Vascular;;   ENDARTERECTOMY FEMORAL Bilateral 11/28/2019   Procedure: ENDARTERECTOMY FEMORAL;  Surgeon: Annice Needy, MD;  Location: [facility];  Service: Vascular;  Laterality: Bilateral;   ENDARTERECTOMY FEMORAL Right 05/23/2023   Procedure: ENDARTERECTOMY FEMORAL REDO;  Surgeon: Annice Needy, MD;  Location: [facility];  Service: Vascular;  Laterality: Right;   ENDOVASCULAR REPAIR/STENT GRAFT Right 05/23/2023   Procedure: ENDOVASCULAR REPAIR/STENT GRAFT;  Surgeon: Annice Needy, MD;  Location: [facility];  Service: Vascular;  Laterality: Right;  Right SFA Stents   LOWER EXTREMITY ANGIOGRAPHY Right 11/14/2019   Procedure: LOWER EXTREMITY ANGIOGRAPHY;  Surgeon: Annice Needy, MD;  Location: [facility];  Service: Cardiovascular;  Laterality: Right;   LOWER EXTREMITY ANGIOGRAPHY Right 12/19/2021   Procedure: Lower Extremity Angiography;  Surgeon: Annice Needy, MD;  Location: [facility];  Service: Cardiovascular;  Laterality: Right;   LOWER EXTREMITY ANGIOGRAPHY Right 01/30/2022   Procedure: Lower Extremity Angiography;  Surgeon: Annice Needy, MD;  Location: [facility];  Service: Cardiovascular;  Laterality: Right;   LOWER EXTREMITY ANGIOGRAPHY Right 05/21/2023   Procedure: Lower Extremity Angiography;  Surgeon: Annice Needy, MD;  Location: [facility];  Service: Cardiovascular;  Laterality: Right;   MECHANICAL THROMBECTOMY WITH AORTOGRAM AND INTERVENTION  05/23/2023   Procedure: MECHANICAL THROMBECTOMY right popliteal artery;  Surgeon: Annice Needy, MD;  Location: [facility];  Service: Vascular;;   STOMACH SURGERY     exploratory   TONSILECTOMY, ADENOIDECTOMY, BILATERAL MYRINGOTOMY AND TUBES     TONSILLECTOMY     TRANSMETATARSAL  AMPUTATION Right 06/21/2023   Procedure: TRANSMETATARSAL AMPUTATION;  Surgeon: Rosetta Posner, DPM;  Location: [facility];  Service: Orthopedics/Podiatry;  Laterality: Right;    Family History  Problem Relation Age of Onset   Leukemia Mother    Heart disease Father        a. CABG   Heart disease Sister        a. MVR   Cancer Sister    Cancer Brother    CAD Brother        a. CABG    No Known Allergies     Latest Ref Rng & Units 06/06/2023   11:47 AM 05/26/2023    5:29 AM 05/25/2023    3:36 AM  CBC  WBC 4.0 - 10.5 K/uL 13.5  9.7  11.6   Hemoglobin 13.0 - 17.0 g/dL 16.1  09.6  04.5   Hematocrit 39.0 - 52.0 % 37.8  30.2  35.3   Platelets 150 - 400 K/uL 644  311  324       CMP     Component Value Date/Time   NA 136 06/06/2023 1147   K 4.6 06/06/2023 1147   CL 101 06/06/2023 1147   CO2 25 06/06/2023 1147   GLUCOSE 119 (H) 06/06/2023 1147   BUN 10 06/06/2023 1147   CREATININE 0.86 06/06/2023 1147   CALCIUM 9.2 06/06/2023 1147   PROT 7.9 06/06/2023 1147   ALBUMIN 3.5 06/06/2023 1147   AST 16 06/06/2023 1147   ALT 16 06/06/2023 1147   ALKPHOS 78 06/06/2023 1147   BILITOT 0.7 06/06/2023 1147   GFRNONAA >60 06/06/2023 1147     No results found.     Assessment & Plan:   1. Critical limb ischemia of right lower extremity (HCC) (Primary) Today the patient presented with a very cold and painful right lower extremity.  His studies revealed that he has a ABI of 0 with noted occlusion of the SFA and popliteal artery.  He is mostly healed from his  recent TMA but there is still small area.  Given the fact that he is and extreme pain if he is clearly ischemic, we have taken the patient directly to the emergency room.  I have discussed with the triage nurse as well as one of the emergency room physicians.  Will plan on patient the patient on a heparin drip with the hope for angiogram.  We have discussed the risk, benefits and alternatives and he is agreeable to proceed with this.  He also understands that he may require a lysis catheter as he has done previously.  2. Tobacco use disorder Patient stopped in August but does continue to smoke weed which can also have inflammatory component   Current Outpatient Medications on File Prior to Visit  Medication Sig Dispense Refill   amoxicillin-clavulanate (AUGMENTIN) 875-125 MG tablet Take 1 tablet by mouth 2 (two) times daily. 14 tablet 0   aspirin EC 81 MG tablet Take 1 tablet (81 mg total) by mouth daily.     clopidogrel (PLAVIX) 75 MG tablet Take 1 tablet (75 mg total) by mouth daily.     dexlansoprazole (DEXILANT) 60 MG capsule Take 60 mg by mouth as needed.     leptospermum manuka honey (MEDIHONEY) PSTE paste Apply 1 Application topically daily. 44 mL 1   leptospermum manuka honey (MEDIHONEY) PSTE paste Apply 1 Application topically daily. 44 mL 3   ondansetron (ZOFRAN) 4 MG tablet Take 1 tablet (4 mg total) by mouth every 8 (eight)  hours as needed for nausea or vomiting. 20 tablet 0   oxyCODONE-acetaminophen (PERCOCET) 7.5-325 MG tablet Take 1 tablet by mouth every 4 (four) hours as needed for severe pain. 20 tablet 0   apixaban (ELIQUIS) 5 MG TABS tablet Take 1 tablet (5 mg total) by mouth 2 (two) times daily.     atorvastatin (LIPITOR) 40 MG tablet Take 1 tablet (40 mg total) by mouth daily. 30 tablet 1   gabapentin (NEURONTIN) 300 MG capsule Take 1 capsule (300 mg total) by mouth 2 (two) times daily. 60 capsule 1   sulfamethoxazole-trimethoprim (BACTRIM DS) 800-160 MG tablet Take 1 tablet  by mouth 2 (two) times daily. (Patient not taking: Reported on 09/27/2023) 20 tablet 0   No current facility-administered medications on file prior to visit.    There are no Patient Instructions on file for this visit. No follow-ups on file.   Georgiana Spinner, NP

## 2023-09-27 NOTE — Assessment & Plan Note (Signed)
-   Atorvastatin 40 mg nightly resumed 

## 2023-09-28 ENCOUNTER — Inpatient Hospital Stay: Payer: Medicaid Other | Admitting: Anesthesiology

## 2023-09-28 ENCOUNTER — Other Ambulatory Visit: Payer: Self-pay

## 2023-09-28 ENCOUNTER — Encounter
Admission: EM | Disposition: A | Discharge status: Home / Self Care | Payer: Self-pay | Source: Ambulatory Visit | Attending: Internal Medicine

## 2023-09-28 DIAGNOSIS — I70261 Atherosclerosis of native arteries of extremities with gangrene, right leg: Secondary | ICD-10-CM

## 2023-09-28 DIAGNOSIS — Z9889 Other specified postprocedural states: Secondary | ICD-10-CM

## 2023-09-28 DIAGNOSIS — T82856A Stenosis of peripheral vascular stent, initial encounter: Secondary | ICD-10-CM

## 2023-09-28 HISTORY — PX: LOWER EXTREMITY ANGIOGRAPHY: CATH118251

## 2023-09-28 LAB — CBC
HCT: 34.3 % — ABNORMAL LOW (ref 39.0–52.0)
Hemoglobin: 11.5 g/dL — ABNORMAL LOW (ref 13.0–17.0)
MCH: 30.2 pg (ref 26.0–34.0)
MCHC: 33.5 g/dL (ref 30.0–36.0)
MCV: 90 fL (ref 80.0–100.0)
Platelets: 314 10*3/uL (ref 150–400)
RBC: 3.81 MIL/uL — ABNORMAL LOW (ref 4.22–5.81)
RDW: 14.6 % (ref 11.5–15.5)
WBC: 8.4 10*3/uL (ref 4.0–10.5)
nRBC: 0 % (ref 0.0–0.2)

## 2023-09-28 LAB — BASIC METABOLIC PANEL
Anion gap: 9 (ref 5–15)
BUN: 9 mg/dL (ref 8–23)
CO2: 25 mmol/L (ref 22–32)
Calcium: 8.6 mg/dL — ABNORMAL LOW (ref 8.9–10.3)
Chloride: 102 mmol/L (ref 98–111)
Creatinine, Ser: 0.78 mg/dL (ref 0.61–1.24)
GFR, Estimated: >60 mL/min (ref >60)
Glucose, Bld: 100 mg/dL — ABNORMAL HIGH (ref 70–99)
Potassium: 3.6 mmol/L (ref 3.5–5.1)
Sodium: 136 mmol/L (ref 135–145)

## 2023-09-28 LAB — HEPARIN LEVEL (UNFRACTIONATED): Heparin Unfractionated: 0.35 [IU]/mL (ref 0.30–0.70)

## 2023-09-28 LAB — APTT: aPTT: 47 s — ABNORMAL HIGH (ref 24–36)

## 2023-09-28 LAB — MAGNESIUM: Magnesium: 2.3 mg/dL (ref 1.7–2.4)

## 2023-09-28 LAB — PHOSPHORUS: Phosphorus: 3 mg/dL (ref 2.5–4.6)

## 2023-09-28 SURGERY — LOWER EXTREMITY ANGIOGRAPHY
Anesthesia: General | Laterality: Right

## 2023-09-28 MED ORDER — FENTANYL CITRATE PF 50 MCG/ML IJ SOSY: 12.5000 ug | PREFILLED_SYRINGE | Freq: Once | INTRAMUSCULAR | Status: DC | PRN

## 2023-09-28 MED ORDER — DROPERIDOL 2.5 MG/ML IJ SOLN: 0.6250 mg | Freq: Once | INTRAMUSCULAR | Status: DC | PRN

## 2023-09-28 MED ORDER — LIDOCAINE HCL (CARDIAC) PF 100 MG/5ML IV SOSY
PREFILLED_SYRINGE | INTRAVENOUS | Status: DC | PRN
  Administered 2023-09-28: 60 mg via INTRAVENOUS
  Administered 2023-09-28: 40 mg via INTRAVENOUS

## 2023-09-28 MED ORDER — CEFAZOLIN SODIUM-DEXTROSE 2-4 GM/100ML-% IV SOLN
INTRAVENOUS | Status: AC
  Filled 2023-09-28: qty 100

## 2023-09-28 MED ORDER — FAMOTIDINE 20 MG PO TABS: 40.0000 mg | ORAL_TABLET | Freq: Once | ORAL | Status: DC | PRN

## 2023-09-28 MED ORDER — LIDOCAINE-EPINEPHRINE (PF) 1 %-1:200000 IJ SOLN
INTRAMUSCULAR | Status: DC | PRN
  Administered 2023-09-28: 10 mL via INTRADERMAL

## 2023-09-28 MED ORDER — MIDAZOLAM HCL 2 MG/ML PO SYRP: 8.0000 mg | ORAL_SOLUTION | Freq: Once | ORAL | Status: DC | PRN

## 2023-09-28 MED ORDER — FENTANYL CITRATE (PF) 100 MCG/2ML IJ SOLN
INTRAMUSCULAR | Status: AC
  Filled 2023-09-28: qty 2

## 2023-09-28 MED ORDER — FENTANYL CITRATE (PF) 100 MCG/2ML IJ SOLN: 25.0000 ug | INTRAMUSCULAR | Status: DC | PRN

## 2023-09-28 MED ORDER — DEXMEDETOMIDINE HCL IN NACL 80 MCG/20ML IV SOLN
INTRAVENOUS | Status: DC | PRN
  Administered 2023-09-28: 8 ug via INTRAVENOUS

## 2023-09-28 MED ORDER — SODIUM CHLORIDE 0.9 % IV BOLUS
500.0000 mL | Freq: Once | INTRAVENOUS | Status: AC
  Administered 2023-09-28: 500 mL via INTRAVENOUS

## 2023-09-28 MED ORDER — IODIXANOL 320 MG/ML IV SOLN
INTRAVENOUS | Status: DC | PRN
  Administered 2023-09-28: 35 mL via INTRA_ARTERIAL

## 2023-09-28 MED ORDER — METHYLPREDNISOLONE SODIUM SUCC 125 MG IJ SOLR: 125.0000 mg | Freq: Once | INTRAMUSCULAR | Status: DC | PRN

## 2023-09-28 MED ORDER — MIDAZOLAM HCL 2 MG/2ML IJ SOLN
INTRAMUSCULAR | Status: AC
  Filled 2023-09-28: qty 2

## 2023-09-28 MED ORDER — HEPARIN BOLUS VIA INFUSION
2400.0000 [IU] | Freq: Once | INTRAVENOUS | Status: AC
  Administered 2023-09-28: 2400 [IU] via INTRAVENOUS
  Filled 2023-09-28: qty 2400

## 2023-09-28 MED ORDER — CEFAZOLIN SODIUM-DEXTROSE 2-4 GM/100ML-% IV SOLN
2.0000 g | INTRAVENOUS | Status: AC
  Administered 2023-09-28: 2 g via INTRAVENOUS

## 2023-09-28 MED ORDER — ACETAMINOPHEN 10 MG/ML IV SOLN: 1000.0000 mg | Freq: Once | INTRAVENOUS | Status: DC | PRN

## 2023-09-28 MED ORDER — PROPOFOL 10 MG/ML IV BOLUS
INTRAVENOUS | Status: DC | PRN
  Administered 2023-09-28: 40 mg via INTRAVENOUS

## 2023-09-28 MED ORDER — FENTANYL CITRATE (PF) 100 MCG/2ML IJ SOLN
INTRAMUSCULAR | Status: DC | PRN
  Administered 2023-09-28 (×2): 25 ug via INTRAVENOUS

## 2023-09-28 MED ORDER — SODIUM CHLORIDE 0.9 % IV SOLN: INTRAVENOUS | Status: DC

## 2023-09-28 MED ORDER — HEPARIN (PORCINE) 25000 UT/250ML-% IV SOLN
INTRAVENOUS | Status: AC
  Filled 2023-09-28: qty 250

## 2023-09-28 MED ORDER — OXYCODONE HCL 5 MG PO TABS: 5.0000 mg | ORAL_TABLET | Freq: Once | ORAL | Status: DC | PRN

## 2023-09-28 MED ORDER — PROPOFOL 1000 MG/100ML IV EMUL
INTRAVENOUS | Status: AC
Start: 2023-09-28 — End: ?
  Filled 2023-09-28: qty 100

## 2023-09-28 MED ORDER — PROPOFOL 500 MG/50ML IV EMUL
INTRAVENOUS | Status: DC | PRN
  Administered 2023-09-28: 100 ug/kg/min via INTRAVENOUS

## 2023-09-28 MED ORDER — LIDOCAINE HCL (PF) 2 % IJ SOLN
INTRAMUSCULAR | Status: AC
  Filled 2023-09-28: qty 5

## 2023-09-28 MED ORDER — DIPHENHYDRAMINE HCL 50 MG/ML IJ SOLN: 50.0000 mg | Freq: Once | INTRAMUSCULAR | Status: DC | PRN

## 2023-09-28 MED ORDER — OXYCODONE HCL 5 MG/5ML PO SOLN: 5.0000 mg | Freq: Once | ORAL | Status: DC | PRN

## 2023-09-28 MED ORDER — MIDAZOLAM HCL 2 MG/2ML IJ SOLN
INTRAMUSCULAR | Status: DC | PRN
  Administered 2023-09-28: 2 mg via INTRAVENOUS

## 2023-09-28 MED ORDER — OXYCODONE-ACETAMINOPHEN 7.5-325 MG PO TABS
1.0000 | ORAL_TABLET | ORAL | 0 refills | Status: DC | PRN
  Filled 2023-09-28: qty 20, 4d supply, fill #0

## 2023-09-28 MED ORDER — PHENYLEPHRINE HCL-NACL 20-0.9 MG/250ML-% IV SOLN
INTRAVENOUS | Status: AC
  Filled 2023-09-28: qty 250

## 2023-09-28 MED ORDER — PHENYLEPHRINE HCL-NACL 20-0.9 MG/250ML-% IV SOLN
INTRAVENOUS | Status: DC | PRN
  Administered 2023-09-28: 20 ug/min via INTRAVENOUS

## 2023-09-28 MED ORDER — HEPARIN (PORCINE) IN NACL 1000-0.9 UT/500ML-% IV SOLN
INTRAVENOUS | Status: DC | PRN
  Administered 2023-09-28: 1000 mL

## 2023-09-28 SURGICAL SUPPLY — 10 items
CANNULA 5F STIFF (CANNULA) IMPLANT
CATH ANGIO 5F PIGTAIL 65CM (CATHETERS) IMPLANT
COVER PROBE ULTRASOUND 5X96 (MISCELLANEOUS) IMPLANT
DEVICE STARCLOSE SE CLOSURE (Vascular Products) IMPLANT
GUIDEWIRE SUPER STIFF .035X180 (WIRE) IMPLANT
PACK ANGIOGRAPHY (CUSTOM PROCEDURE TRAY) ×1 IMPLANT
SHEATH BRITE TIP 5FRX11 (SHEATH) IMPLANT
SYR MEDRAD MARK 7 150ML (SYRINGE) IMPLANT
TUBING CONTRAST HIGH PRESS 72 (TUBING) IMPLANT
WIRE GUIDERIGHT .035X150 (WIRE) IMPLANT

## 2023-09-28 NOTE — Transfer of Care (Signed)
Immediate Anesthesia Transfer of Care Note  Patient: Brendan Williams  Procedure(s) Performed: Lower Extremity Angiography (Right)  Patient Location: PACU  Anesthesia Type:General  Level of Consciousness: awake, alert , and oriented  Airway & Oxygen Therapy: Patient Spontanous Breathing  Post-op Assessment: Report given to RN and Post -op Vital signs reviewed and stable  Post vital signs: stable  Last Vitals:  Vitals Value Taken Time  BP    Temp    Pulse    Resp    SpO2      Last Pain:  Vitals:   09/28/23 0845  TempSrc: Oral  PainSc: 10-Worst pain ever         Complications: No notable events documented.

## 2023-09-28 NOTE — Op Note (Signed)
[facility]  Percutaneous Study/Intervention Procedural Note   Date of Surgery: 09/28/2023  Surgeon(s):Millan Legan    Assistants:none  Pre-operative Diagnosis: PAD with rest pain and early gangrenous changes right lower extremity  Post-operative diagnosis:  Same  Procedure(s) Performed:             1.  Ultrasound guidance for vascular access left femoral artery             2.  Catheter placement into aorta from left femoral approach             3.  Aortogram and selective right lower extremity angiogram             4.  StarClose closure device left femoral artery  EBL: 5 cc  Contrast: 35 cc  Fluoro Time: 0.6 minutes  Anesthesia: MAC              Indications:  Patient is a 62 y.o.male with an extensive and long history of peripheral arterial disease with recurrent ischemic rest pain of the right foot with early gangrenous changes. The patient has noninvasive study showing essentially no flow in the right lower extremity consistent with thrombosis of many of his previous interventions. The patient is brought in for angiography for further evaluation and potential treatment.  Due to the limb threatening nature of the situation, angiogram was performed for attempted limb salvage. The patient is aware that if the procedure fails, amputation would be expected.  The patient also understands that even with successful revascularization, amputation may still be required due to the severity of the situation.  Risks and benefits are discussed and informed consent is obtained.   Procedure:  The patient was identified and appropriate procedural time out was performed.  The patient was then placed supine on the table and prepped and draped in the usual sterile fashion. Moderate conscious sedation was administered during a face to face encounter with the patient throughout the procedure with my supervision of the RN administering medicines and monitoring the patient's vital  signs, pulse oximetry, telemetry and mental status throughout from the start of the procedure until the patient was taken to the recovery room. Ultrasound was used to evaluate the left common femoral artery.  It was patent .  A digital ultrasound image was acquired.  A Seldinger needle was used to access the left common femoral artery under direct ultrasound guidance and a permanent image was performed.  A 0.035 J wire was advanced without resistance and a 5Fr sheath was placed.  Pigtail catheter was placed into the aorta and an AP aortogram was performed. This demonstrated normal renal arteries and normal aorta.  The aortic bifurcation had been rebuilt by kissing stents that would preclude typical up and over intervention.  The left iliac stents were all widely patent.  The right iliac stents were all occluded as was the common femoral artery.  There was really no way of crossing this bifurcation with the rebuilt bifurcation from previous interventions and the right common femoral artery was occluded so accessing this to try to treat the iliacs was highly unlikely to have any success.  I elected to image the right lower extremity through the pigtail catheter in the aorta.  Selective right lower extremity angiogram was then performed. This demonstrated occlusion of the right common femoral artery and the entirety of the SFA and popliteal arteries with no runoff distally.  There was reconstitution of the mid profunda femoris artery through collaterals although it  was small.  This is a dire situation with no obvious reasonable chance of revascularization and at this point the best outcome would be to heal an above-knee amputation I believe.  I elected to terminate the procedure. The sheath was removed and StarClose closure device was deployed in the left femoral artery with excellent hemostatic result. The patient was taken to the recovery room in stable condition having tolerated the procedure well.  Findings:                Aortogram:  This demonstrated normal renal arteries and normal aorta.  The aortic bifurcation had been rebuilt by kissing stents that would preclude typical up and over intervention.  The left iliac stents were all widely patent.  The right iliac stents were all occluded as was the common femoral artery.             Right lower Extremity:   This demonstrated occlusion of the right common femoral artery and the entirety of the SFA and popliteal arteries with no runoff distally.  There was reconstitution of the mid profunda femoris artery through collaterals although it was small   Disposition: Patient was taken to the recovery room in stable condition having tolerated the procedure well.  Complications: None  Festus Barren 09/28/2023 10:27 AM   This note was created with Dragon Medical transcription system. Any errors in dictation are purely unintentional.

## 2023-09-28 NOTE — Progress Notes (Incomplete)
[facility] Progress Note  Patient: Brendan Williams    GEX:[id number]  DOA: 09/27/2023     Date of Service: the patient was seen and examined on 09/28/2023  Chief Complaint  Patient presents with   Leg Swelling   Brief hospital course: [name] is a 62 year old male with history of severe PAD, former history of tobacco use, GERD, hyperlipidemia, neuropathy, who presents emergency department for chief concerns of right leg pain.   Patient was sent from outpatient vascular office.   Vitals in the ED showed temperature of 98.2, respiration rate of 16, heart rate 80, blood pressure initially 93/72, improved to 113/96, SpO2 97% on room air.   Serum sodium is 131, potassium 4.0, chloride 95, bicarb 29, BUN of 11, serum creatinine of 0.74, EGFR greater than 60, nonfasting blood glucose 108, WBC 11, hemoglobin 11.9, platelets of 312.     Assessment and Plan:  # Acute lower limb ischemia Continue heparin GGT Continue PRN pain medications Vascular surgery consulted, s/p angiogram: Occluded right iliac stents, common femoral artery, SFA and popliteal arteries. Patient will most likely need above-knee amputation Follow vascular surgery for further recommendation   # Elevated ETOH level Patient denies history of withdrawal, seizure without alcohol Patient drinks approximately 12 drinks per week, counseled patient on cessation extensively Monitor for withdrawals and continue CIWA protocol  # Neuropathy Home gabapentin 300 mg p.o. twice daily resumed   # Hyperlipidemia Atorvastatin 40 mg nightly resumed   # Chronic anticoagulation Patient also had apixaban 5 mg twice daily.  I am unable to find the assessment/diagnosis for this, I suspect it is triple therapy treatment for severe PAD Discussed with pharmacy, patient last refilled the medication on 08/06/2023 and there are no more refills on this medication at this time. I have not resumed home apixaban on  admission Recommend patient follow-up with vascular/PCP for clarification   # PAD (peripheral artery disease) Extensive counseling with patient to see the cardiology specialist that PCP sent him to for coronary artery disease evaluation given high risk in setting of PAD with limb ischemia Patient endorses understanding and compliance Resume home aspirin 81 mg daily, clopidogrel 75 mg daily for 09/28/2023   # Tobacco use disorder Patient quit smoking 4 months ago   Body mass index is 23.09 kg/m.  Interventions:  Diet: NPO DVT Prophylaxis: Therapeutic Anticoagulation with heparin IV infusion    Advance goals of care discussion: Full code  Family Communication: ***family was present at bedside, at the time of interview.  ***The pt provided permission to discuss medical plan with the family. Opportunity was given to ask question and all questions were answered satisfactorily.   Disposition:  Pt is from ***, admitted with ***, still has ***, which precludes a safe discharge. Discharge to ***, when ***.  Subjective: ****  Physical Exam: General: NAD, lying comfortably Appear in no distress, affect appropriate Eyes: PERRLA ENT: Oral Mucosa Clear, moist  Neck: no JVD,  Cardiovascular: S1 and S2 Present, no Murmur,  Respiratory: good respiratory effort, Bilateral Air entry equal and Decreased, no Crackles, no wheezes Abdomen: Bowel Sound present, Soft and no tenderness,  Skin: no rashes Extremities: no Pedal edema, no calf tenderness, *** Neurologic: without any new focal findings Gait not checked due to patient safety concerns  Vitals:   09/28/23 0200 09/28/23 0400 09/28/23 0450 09/28/23 0730  BP:   93/80 97/66  Pulse: 70 67  60  Resp:   12 12  Temp:  98.1 F (36.7  C)    TempSrc:  Oral    SpO2:    94%  Weight:      Height:        Intake/Output Summary (Last 24 hours) at 09/28/2023 0738 Last data filed at 09/28/2023 0100 Gross per 24 hour  Intake --  Output 300 ml   Net -300 ml   Filed Weights   09/27/23 1227  Weight: 79.4 kg    Data Reviewed: I have personally reviewed and interpreted daily labs, tele strips, imagings as discussed above. I reviewed all nursing notes, pharmacy notes, vitals, pertinent old records I have discussed plan of care as described above with RN and patient/family.  CBC: Recent Labs  Lab 09/27/23 1231 09/28/23 0605  WBC 11.0* 8.4  NEUTROABS 7.8*  --   HGB 11.9* 11.5*  HCT 36.4* 34.3*  MCV 91.2 90.0  PLT 312 314   Basic Metabolic Panel: Recent Labs  Lab 09/27/23 1231 09/28/23 0605  NA 131* 136  K 4.0 3.6  CL 95* 102  CO2 29 25  GLUCOSE 108* 100*  BUN 11 9  CREATININE 0.74 0.78  CALCIUM 8.8* 8.6*    Studies: VAS [place] ABI WITH/WO TBI Result Date: 09/27/2023  LOWER EXTREMITY DOPPLER STUDY Patient Name:  Brendan Williams  Date of Exam:   09/27/2023 Medical Rec #: [id number]         Accession #:    [id number] Date of Birth: 1961/01/19          Patient Gender: M Patient Age:   76 years Exam Location:  [facility] Procedure:      VAS [place] ABI WITH/WO TBI Referring Phys: Barbara Cower DEW --------------------------------------------------------------------------------  Indications: Claudication, rest pain, peripheral artery disease, and Severe pain              right leg. High Risk Factors: Past history of smoking. Other Factors: Right trans metatarsal amputation.  Vascular Interventions: 05/28/2023: right SFA atherectomy, stent, ATA PTA                          11/14/19: Bilateral CIA and EIA Stents.                          11/28/2019: Aorta, Bilateral CIA, Bilateral EIA and                         Bilateral SFA Stents with Right ATA Angioplasty.                         01/2022 rt thrombectomy and new stents sfa pop. Performing Technologist: Hardie Lora RVT  Examination Guidelines: A complete evaluation includes at minimum, Doppler waveform signals and systolic blood pressure reading at the level of bilateral  brachial, anterior tibial, and posterior tibial arteries, when vessel segments are accessible. Bilateral testing is considered an integral part of a complete examination. Photoelectric Plethysmograph (PPG) waveforms and toe systolic pressure readings are included as required and additional duplex testing as needed. Limited examinations for reoccurring indications may be performed as noted.  ABI Findings: +---------+------------------+-----+--------+----------+ Right    Rt Pressure (mmHg)IndexWaveformComment    +---------+------------------+-----+--------+----------+ Brachial 88                                        +---------+------------------+-----+--------+----------+  PTA      0                 0.00 absent             +---------+------------------+-----+--------+----------+ PERO     0                 0.00 absent             +---------+------------------+-----+--------+----------+ DP       0                 0.00 absent             +---------+------------------+-----+--------+----------+ Great Toe                               amputation +---------+------------------+-----+--------+----------+ +---------+------------------+-----+----------+-------+ Left     Lt Pressure (mmHg)IndexWaveform  Comment +---------+------------------+-----+----------+-------+ Brachial 97                                       +---------+------------------+-----+----------+-------+ PTA      81                0.84 monophasic        +---------+------------------+-----+----------+-------+ DP       78                0.80 monophasic        +---------+------------------+-----+----------+-------+ Great Toe28                0.29                   +---------+------------------+-----+----------+-------+ +-------+-----------+-----------+------------+------------+ ABI/TBIToday's ABIToday's TBIPrevious ABIPrevious TBI +-------+-----------+-----------+------------+------------+  Right  0.0        amputation 1.12        1.09         +-------+-----------+-----------+------------+------------+ Left   0.84       0.29       0.90        0.60         +-------+-----------+-----------+------------+------------+ No flow seen in right anterior tibial, posterior tibial, or peroneal arteries by duplex. Limited imaging showed right SFA and popliteal artery occlusion. Right ABIs appear decreased compared to prior study on 07/25/2022. Left ABIs appear essentially unchanged compared to prior study on 07/25/2022. Limited imaging showed occluded right SFA and popliteal arteries.  Summary: Right: Resting right ankle-brachial index indicates critical limb ischemia. Left: Resting left ankle-brachial index indicates mild left lower extremity arterial disease. The left toe-brachial index is abnormal. *See table(s) above for measurements and observations.  Electronically signed by Levora Dredge MD on 09/27/2023 at 4:45:04 PM.    Final     Scheduled Meds:  aspirin EC  81 mg Oral Daily   atorvastatin  40 mg Oral QHS   clopidogrel  75 mg Oral Daily   gabapentin  300 mg Oral BID   Continuous Infusions:  sodium chloride      ceFAZolin (ANCEF) IV     heparin 1,300 Units/hr (09/28/23 0727)   PRN Meds: acetaminophen **OR** acetaminophen, benzonatate, diphenhydrAMINE, diphenhydrAMINE, famotidine, fentaNYL (SUBLIMAZE) injection, fentaNYL (SUBLIMAZE) injection, melatonin, methylPREDNISolone (SOLU-MEDROL) injection, midazolam, morphine injection, ondansetron **OR** ondansetron (ZOFRAN) IV, pantoprazole (PROTONIX) IV  Time spent: 35 minutes  Author: Gillis Santa. MD [facility] 09/28/2023 7:38 AM  To reach On-call, see care teams to locate the attending  and reach out to them via [email or web address]. If 7PM-7AM, please contact night-coverage If you still have difficulty reaching the attending provider, please page the [facility] (Director on Call) for [facility] on amion for assistance.

## 2023-09-28 NOTE — Anesthesia Preprocedure Evaluation (Signed)
Anesthesia Evaluation  Patient identified by MRN, date of birth, ID band Patient awake    Reviewed: Allergy & Precautions, H&P , NPO status , Patient's Chart, lab work & pertinent test results, reviewed documented beta blocker date and time   Airway Mallampati: II   Neck ROM: full    Dental  (+) Poor Dentition   Pulmonary neg shortness of breath, asthma , Patient abstained from smoking., former smoker   Pulmonary exam normal        Cardiovascular Exercise Tolerance: Poor + Peripheral Vascular Disease  Normal cardiovascular exam Rhythm:regular Rate:Normal     Neuro/Psych   Anxiety     negative neurological ROS  negative psych ROS   GI/Hepatic negative GI ROS, Neg liver ROS,,,  Endo/Other  negative endocrine ROS    Renal/GU negative Renal ROS  negative genitourinary   Musculoskeletal   Abdominal   Peds  Hematology negative hematology ROS (+)   Anesthesia Other Findings Past Medical History: No date: Anxiety     Comment:  situational No date: Asthma     Comment:  as a child No date: Atherosclerosis of native arteries of extremity with  intermittent claudication (HCC) No date: Cellulitis and abscess of toe of right foot No date: Critical limb ischemia of right lower extremity (HCC) No date: Current smoker     Comment:  Quit 04/16/23 No date: Peripheral arterial disease (HCC) Past Surgical History: 12/02/2019: AMPUTATION TOE; Right     Comment:  Procedure: AMPUTATION RIGHT 5TH TOE;  Surgeon: Rosetta Posner, DPM;  Location: [facility];  Service: Podiatry;                Laterality: Right; 11/28/2019: APPLICATION OF WOUND VAC; Bilateral     Comment:  Procedure: APPLICATION OF WOUND VAC;  Surgeon: Annice Needy, MD;  Location: [facility];  Service: Vascular;                Laterality: Bilateral;  Prevena  No date: BACK SURGERY     Comment:  lower ruptured disk 11/28/2019: CENTRAL VENOUS  CATHETER INSERTION     Comment:  Procedure: INSERTION CENTRAL LINE ADULT;  Surgeon: Annice Needy, MD;  Location: [facility];  Service: Vascular;; 11/28/2019: ENDARTERECTOMY FEMORAL; Bilateral     Comment:  Procedure: ENDARTERECTOMY FEMORAL;  Surgeon: Annice Needy, MD;  Location: [facility];  Service: Vascular;                Laterality: Bilateral; 05/23/2023: ENDARTERECTOMY FEMORAL; Right     Comment:  Procedure: ENDARTERECTOMY FEMORAL REDO;  Surgeon: Annice Needy, MD;  Location: [facility];  Service: Vascular;                Laterality: Right; 05/23/2023: ENDOVASCULAR REPAIR/STENT GRAFT; Right     Comment:  Procedure: ENDOVASCULAR REPAIR/STENT GRAFT;  Surgeon:               Annice Needy, MD;  Location: [facility];  Service:               Vascular;  Laterality: Right;  Right SFA Stents  11/14/2019: LOWER EXTREMITY ANGIOGRAPHY; Right     Comment:  Procedure: LOWER EXTREMITY ANGIOGRAPHY;  Surgeon: Annice Needy, MD;  Location: [facility];  Service:               Cardiovascular;  Laterality: Right; 12/19/2021: LOWER EXTREMITY ANGIOGRAPHY; Right     Comment:  Procedure: Lower Extremity Angiography;  Surgeon: Annice Needy, MD;  Location: [facility];  Service:               Cardiovascular;  Laterality: Right; 01/30/2022: LOWER EXTREMITY ANGIOGRAPHY; Right     Comment:  Procedure: Lower Extremity Angiography;  Surgeon: Annice Needy, MD;  Location: [facility];  Service:               Cardiovascular;  Laterality: Right; 05/21/2023: LOWER EXTREMITY ANGIOGRAPHY; Right     Comment:  Procedure: Lower Extremity Angiography;  Surgeon: Annice Needy, MD;  Location: [facility];  Service:               Cardiovascular;  Laterality: Right; 05/23/2023: MECHANICAL THROMBECTOMY WITH AORTOGRAM AND INTERVENTION     Comment:  Procedure: MECHANICAL THROMBECTOMY right popliteal                artery;  Surgeon: Annice Needy, MD;  Location: [facility];               Service: Vascular;; No date: STOMACH SURGERY     Comment:  exploratory No date: TONSILECTOMY, ADENOIDECTOMY, BILATERAL MYRINGOTOMY AND TUBES No date: TONSILLECTOMY 06/21/2023: TRANSMETATARSAL AMPUTATION; Right     Comment:  Procedure: TRANSMETATARSAL AMPUTATION;  Surgeon: Rosetta Posner, DPM;  Location: [facility];  Service:               Orthopedics/Podiatry;  Laterality: Right; BMI    Body Mass Index: 23.09 kg/m     Reproductive/Obstetrics negative OB ROS                             Anesthesia Physical Anesthesia Plan  ASA: 4  Anesthesia Plan: General   Post-op Pain Management:    Induction:   PONV Risk Score and Plan: 3  Airway Management Planned:   Additional Equipment:   Intra-op Plan:   Post-operative Plan:   Informed Consent: I have reviewed the patients History and Physical, chart, labs and discussed the procedure including the risks, benefits and alternatives for the proposed anesthesia with the patient or authorized representative who has indicated his/her understanding and acceptance.     Dental Advisory Given  Plan Discussed with: CRNA  Anesthesia Plan Comments:        Anesthesia Quick Evaluation

## 2023-09-28 NOTE — Consult Note (Signed)
Pharmacy Consult Note - Anticoagulation  Pharmacy Consult for heparin Indication:  arterial occlusion  PATIENT MEASUREMENTS: Height: 6\' 1"  (185.4 cm) Weight: 79.4 kg (175 lb) IBW/kg (Calculated) : 79.9 HEPARIN DW (KG): 79.4  VITAL SIGNS: Temp: 98.1 F (36.7 C) (12/13 0400) Temp Source: Oral (12/13 0400) BP: 93/80 (12/13 0450) Pulse Rate: 67 (12/13 0400)  Recent Labs    09/27/23 1231 09/27/23 2133 09/28/23 0605  HGB 11.9*  --  11.5*  HCT 36.4*  --  34.3*  PLT 312  --  314  APTT 45*   < > 47*  LABPROT 14.3  --   --   INR 1.1  --   --   HEPARINUNFRC >1.10*  --  0.35  CREATININE 0.74  --   --    < > = values in this interval not displayed.    Estimated Creatinine Clearance: 106.1 mL/min (by C-G formula based on SCr of 0.74 mg/dL).  PAST MEDICAL HISTORY: Past Medical History:  Diagnosis Date   Anxiety    situational   Asthma    as a child   Atherosclerosis of native arteries of extremity with intermittent claudication (HCC)    Cellulitis and abscess of toe of right foot    Critical limb ischemia of right lower extremity (HCC)    Current smoker    Quit 04/16/23   Peripheral arterial disease [facility])     ASSESSMENT: 62 y.o. male with PMH including PAD is presenting with arterial occlusion. Patient endorses pain in right leg; reportedly the leg is red and cool to the touch  No imaging information available yet. Patient is on chronic anticoagulation with Eliquis per chart review. Questionable adherence to anticoagulation in the past; will order baseline anti-Xa level to check for potential false elevation if Eliquis was taken recently. Pharmacy has been consulted to initiate and manage heparin intravenous infusion.  Update: Baseline anti-Xa level came back elevated, not correlating with aPTT, suggesting that patient has been adherent to Eliquis.  Pertinent medications: Eliquis 5 mg twice daily Time of last dose unknown  Goal(s) of therapy: Heparin level 0.3 - 0.7  units/mL aPTT 66 - 102 seconds Monitor platelets by anticoagulation protocol: Yes   Baseline anticoagulation labs: Recent Labs    09/27/23 1231 09/27/23 2133 09/28/23 0605  APTT 45* 128* 47*  INR 1.1  --   --   HGB 11.9*  --  11.5*  PLT 312  --  314    Date Time aPTT/HL Rate/Comment 12/12 2133 128/--  Supratherapeutic@1300  units/hr 12/13 0605 47 / 0.35 Subtherapeutic  PLAN: Suspect prior aPTT possible bad draw Bolus 2400 units x 1 Increase heparin infusion rate back to 1300 units/hr Recheck aPTT in 6 hrs after rate change Continue to titrate by aPTT until heparin level and aPTT correlate and/or Eliquis washes out, then titrate by heparin level alone. Continue to monitor CBC daily while on heparin infusion.  Otelia Sergeant, PharmD, [facility] 09/28/2023 7:05 AM

## 2023-09-28 NOTE — Interval H&P Note (Signed)
History and Physical Interval Note:  09/28/2023 8:31 AM  Brendan Williams  has presented today for surgery, with the diagnosis of PAD.  The various methods of treatment have been discussed with the patient and family. After consideration of risks, benefits and other options for treatment, the patient has consented to  Procedure(s): Lower Extremity Angiography (Right) as a surgical intervention.  The patient's history has been reviewed, patient examined, no change in status, stable for surgery.  I have reviewed the patient's chart and labs.  Questions were answered to the patient's satisfaction.     Festus Barren

## 2023-09-28 NOTE — Anesthesia Postprocedure Evaluation (Signed)
Anesthesia Post Note  Patient: Brendan Williams  Procedure(s) Performed: Lower Extremity Angiography (Right)  Patient location during evaluation: Specials Recovery Anesthesia Type: General Level of consciousness: awake and alert Pain management: pain level controlled Vital Signs Assessment: post-procedure vital signs reviewed and stable Respiratory status: spontaneous breathing, nonlabored ventilation, respiratory function stable and patient connected to nasal cannula oxygen Cardiovascular status: blood pressure returned to baseline and stable Postop Assessment: no apparent nausea or vomiting Anesthetic complications: no  There were no known notable events for this encounter.   Last Vitals:  Vitals:   09/28/23 1020 09/28/23 1030  BP: 96/74 111/71  Pulse: 68 62  Resp: 12 15  Temp:    SpO2: 99% 99%    Last Pain:  Vitals:   09/28/23 1020  TempSrc:   PainSc: Asleep                 Stephanie Coup

## 2023-10-01 ENCOUNTER — Encounter: Payer: Self-pay | Admitting: Vascular Surgery

## 2023-10-02 NOTE — Plan of Care (Signed)
Patient was admitted under hospitalist service on 09/27/2023 due to acute ischemia of right lower extremity.  Patient was in the ED and he was taken to the OR for angiogram by vascular surgery.  I did not get a chance to see the patient.  Patient never return back to the floor, patient was discharged by vascular surgery after angiogram.   I did not see the patient and had no interaction so I cannot do the discharge summary. I informed to vascular surgery to write a discharge summary.

## 2023-10-03 ENCOUNTER — Emergency Department
Admission: EM | Admit: 2023-10-03 | Discharge: 2023-10-03 | Disposition: A | Discharge status: Home / Self Care | Payer: Medicaid Other | Attending: Emergency Medicine | Admitting: Emergency Medicine

## 2023-10-03 ENCOUNTER — Other Ambulatory Visit: Payer: Self-pay

## 2023-10-03 DIAGNOSIS — L03115 Cellulitis of right lower limb: Secondary | ICD-10-CM

## 2023-10-03 DIAGNOSIS — M79604 Pain in right leg: Secondary | ICD-10-CM

## 2023-10-03 DIAGNOSIS — F17200 Nicotine dependence, unspecified, uncomplicated: Secondary | ICD-10-CM | POA: Insufficient documentation

## 2023-10-03 LAB — CBC WITH DIFFERENTIAL/PLATELET
Abs Immature Granulocytes: 0.12 10*3/uL — ABNORMAL HIGH (ref 0.00–0.07)
Basophils Absolute: 0.1 10*3/uL (ref 0.0–0.1)
Basophils Relative: 0 %
Eosinophils Absolute: 0 10*3/uL (ref 0.0–0.5)
Eosinophils Relative: 0 %
HCT: 37.9 % — ABNORMAL LOW (ref 39.0–52.0)
Hemoglobin: 12.5 g/dL — ABNORMAL LOW (ref 13.0–17.0)
Immature Granulocytes: 1 %
Lymphocytes Relative: 13 %
Lymphs Abs: 1.7 10*3/uL (ref 0.7–4.0)
MCH: 30 pg (ref 26.0–34.0)
MCHC: 33 g/dL (ref 30.0–36.0)
MCV: 91.1 fL (ref 80.0–100.0)
Monocytes Absolute: 1.2 10*3/uL — ABNORMAL HIGH (ref 0.1–1.0)
Monocytes Relative: 9 %
Neutro Abs: 10.1 10*3/uL — ABNORMAL HIGH (ref 1.7–7.7)
Neutrophils Relative %: 77 %
Platelets: 488 10*3/uL — ABNORMAL HIGH (ref 150–400)
RBC: 4.16 MIL/uL — ABNORMAL LOW (ref 4.22–5.81)
RDW: 15 % (ref 11.5–15.5)
WBC: 13.2 10*3/uL — ABNORMAL HIGH (ref 4.0–10.5)
nRBC: 0 % (ref 0.0–0.2)

## 2023-10-03 LAB — BASIC METABOLIC PANEL
Anion gap: 11 (ref 5–15)
BUN: 7 mg/dL — ABNORMAL LOW (ref 8–23)
CO2: 26 mmol/L (ref 22–32)
Calcium: 9 mg/dL (ref 8.9–10.3)
Chloride: 94 mmol/L — ABNORMAL LOW (ref 98–111)
Creatinine, Ser: 0.85 mg/dL (ref 0.61–1.24)
GFR, Estimated: >60 mL/min (ref >60)
Glucose, Bld: 102 mg/dL — ABNORMAL HIGH (ref 70–99)
Potassium: 3.9 mmol/L (ref 3.5–5.1)
Sodium: 131 mmol/L — ABNORMAL LOW (ref 135–145)

## 2023-10-03 LAB — PROTIME-INR
INR: 1 (ref 0.8–1.2)
Prothrombin Time: 13.5 s (ref 11.4–15.2)

## 2023-10-03 MED ORDER — SULFAMETHOXAZOLE-TRIMETHOPRIM 800-160 MG PO TABS
1.0000 | ORAL_TABLET | Freq: Once | ORAL | Status: AC
  Administered 2023-10-03: 1 via ORAL
  Filled 2023-10-03: qty 1

## 2023-10-03 MED ORDER — SULFAMETHOXAZOLE-TRIMETHOPRIM 800-160 MG PO TABS: 1.0000 | ORAL_TABLET | Freq: Two times a day (BID) | ORAL | 0 refills | Status: AC

## 2023-10-03 NOTE — ED Triage Notes (Signed)
Pt presents to ED with c/o of recent angiogram L foot, pt states procedure on 12/13, large blister noted to L shin area.

## 2023-10-03 NOTE — ED Provider Notes (Signed)
[facility] Provider Note   Event Date/Time   First MD Initiated Contact with Patient 10/03/23 2032     (approximate) History  Leg Pain  HPI Brendan Williams is a 62 y.o. 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  Patient states that this just started yesterday with a blister to the right shin that he does not know where it came from.  Patient denies any trauma to this leg.  Patient now endorses spreading redness and pain up the anterior shin which is similar to previous infections that he has had in the past. ROS: Patient currently denies any vision changes, tinnitus, difficulty speaking, facial droop, sore throat, chest pain, shortness of breath, abdominal pain, nausea/vomiting/diarrhea, dysuria, or weakness/numbness/paresthesias in any extremity   Physical Exam  Triage Vital Signs: ED Triage Vitals  Encounter Vitals Group     BP 10/03/23 1737 139/87     Systolic BP Percentile --      Diastolic BP Percentile --      Pulse Rate 10/03/23 1737 95     Resp 10/03/23 1737 17     Temp 10/03/23 1737 98.9 F (37.2 C)     Temp Source 10/03/23 1737 Oral     SpO2 10/03/23 1737 100 %     Weight 10/03/23 1738 175 lb (79.4 kg)     Height 10/03/23 1738 6\' 1"  (1.854 m)     Head Circumference --      Peak Flow --      Pain Score 10/03/23 1738 9     Pain Loc --      Pain Education --      Exclude from Growth Chart --    Most recent vital signs: Vitals:   10/03/23 2047 10/03/23 2150  BP: (!) 86/71 108/64  Pulse: 76 78  Resp: 18 17  Temp:    SpO2: 98% 99%   General: Awake, oriented x4. CV:  Good peripheral perfusion.  Resp:  Normal effort.  Abd:  No distention.  Other:  Middle-aged well-developed, well-nourished Caucasian male resting comfortably in no acute distress.  There is a 4 cm  x 6 cm serous fluid-filled bulla to the anterior shin with surrounding erythema and stranding up the anterior right shin ED Results / Procedures / Treatments  Labs (all labs ordered are listed, but only abnormal results are displayed) Labs Reviewed  BASIC METABOLIC PANEL - Abnormal; Notable for the following components:      Result Value   Sodium 131 (*)    Chloride 94 (*)    Glucose, Bld 102 (*)    BUN 7 (*)    All other components within normal limits  CBC WITH DIFFERENTIAL/PLATELET - Abnormal; Notable for the following components:   WBC 13.2 (*)    RBC 4.16 (*)    Hemoglobin 12.5 (*)    HCT 37.9 (*)    Platelets 488 (*)    Neutro Abs 10.1 (*)    Monocytes Absolute 1.2 (*)    Abs Immature Granulocytes 0.12 (*)    All other components within normal limits  PROTIME-INR  PROCEDURES: Critical Care performed: No Procedures MEDICATIONS ORDERED IN ED: Medications  sulfamethoxazole-trimethoprim (BACTRIM DS) 800-160 MG per tablet 1 tablet (1 tablet Oral Given 10/03/23 2144)   IMPRESSION / MDM / ASSESSMENT AND PLAN / ED COURSE  I reviewed the triage vital signs and the nursing notes.                             The patient is on the cardiac monitor to evaluate for evidence of arrhythmia and/or significant heart rate changes. Patient's presentation is most consistent with acute presentation with potential threat to life or bodily function. Presentation most consistent with simple cellulitis. Given History, Exam, and Workup I have low suspicion for Necrotizing Fasciitis, Abscess, Osteomyelitis, DVT or other emergent problem as a cause for this presentation.  Rx: Bactrim  Disposition: Discharge. No evidence of serious bacterial illness. Nontoxic appearing, VSS. Low risk for treatment failure based on history. Strict return precautions discussed with patient with full understanding. Advised patient to follow up promptly with vascular and/or podiatry within next 48 hours.   FINAL  CLINICAL IMPRESSION(S) / ED DIAGNOSES   Final diagnoses:  Right leg pain  Cellulitis of right lower extremity   Rx / DC Orders   ED Discharge Orders          Ordered    sulfamethoxazole-trimethoprim (BACTRIM DS) 800-160 MG tablet  2 times daily        10/03/23 2143           Note:  This document was prepared using Dragon voice recognition software and may include unintentional dictation errors.   Merwyn Katos, MD 10/03/23 [phone]

## 2023-10-03 NOTE — ED Provider Triage Note (Signed)
Emergency Medicine Provider Triage Evaluation Note  Brendan Williams , a 62 y.o. male  was evaluated in triage.  Pt complains of right leg pain. Recently here for critical limb ischemia, went to OR with Dew on 12/13. Patient reports pain and blistering beginning yesterday.  Review of Systems  Positive: Leg pain Negative: fever  Physical Exam  There were no vitals taken for this visit. Gen:   Awake, no distress   Resp:  Normal effort  MSK:   Moves extremities without difficulty  Other:  Large blister to anterior right shin  Medical Decision Making  Medically screening exam initiated at 5:37 PM.  Appropriate orders placed.  Brendan Williams was informed that the remainder of the evaluation will be completed by another provider, this initial triage assessment does not replace that evaluation, and the importance of remaining in the ED until their evaluation is complete.     Brendan Hoehn, PA-C 10/03/23 1739

## 2023-10-04 ENCOUNTER — Other Ambulatory Visit (INDEPENDENT_AMBULATORY_CARE_PROVIDER_SITE_OTHER): Payer: Self-pay | Admitting: Nurse Practitioner

## 2023-10-04 ENCOUNTER — Telehealth (INDEPENDENT_AMBULATORY_CARE_PROVIDER_SITE_OTHER): Payer: Self-pay

## 2023-10-04 MED ORDER — OXYCODONE-ACETAMINOPHEN 7.5-325 MG PO TABS: 1.0000 | ORAL_TABLET | ORAL | 0 refills | Status: DC | PRN

## 2023-10-04 NOTE — Telephone Encounter (Signed)
[facility]

## 2023-10-04 NOTE — Telephone Encounter (Signed)
sent 

## 2023-10-04 NOTE — Discharge Summary (Signed)
Patient presented with significant ischemic changes to the right lower extremity and was originally planned to be admitted.  Ultimately, he went to angiography where nonreconstructable disease was seen and an amputation was recommended.  The patient declined to be admitted wanting to go home and come back for an amputation at another date.

## 2023-10-04 NOTE — Telephone Encounter (Signed)
Patient spouse left a message requesting for pain medication refill for patient. Please Advise

## 2023-10-04 NOTE — Telephone Encounter (Signed)
Where do they want it sent?

## 2023-10-11 ENCOUNTER — Inpatient Hospital Stay: Payer: Medicaid Other

## 2023-10-11 ENCOUNTER — Inpatient Hospital Stay
Admission: EM | Admit: 2023-10-11 | Discharge: 2023-10-19 | DRG: 239 | Disposition: A | Discharge status: Home Health | Payer: Medicaid Other | Attending: Internal Medicine | Admitting: Internal Medicine

## 2023-10-11 ENCOUNTER — Other Ambulatory Visit: Payer: Self-pay

## 2023-10-11 ENCOUNTER — Encounter: Payer: Self-pay | Admitting: Internal Medicine

## 2023-10-11 DIAGNOSIS — I70221 Atherosclerosis of native arteries of extremities with rest pain, right leg: Secondary | ICD-10-CM | POA: Diagnosis not present

## 2023-10-11 DIAGNOSIS — G629 Polyneuropathy, unspecified: Secondary | ICD-10-CM | POA: Diagnosis present

## 2023-10-11 DIAGNOSIS — E872 Acidosis, unspecified: Secondary | ICD-10-CM | POA: Diagnosis present

## 2023-10-11 DIAGNOSIS — Z7982 Long term (current) use of aspirin: Secondary | ICD-10-CM | POA: Diagnosis not present

## 2023-10-11 DIAGNOSIS — A419 Sepsis, unspecified organism: Principal | ICD-10-CM | POA: Diagnosis present

## 2023-10-11 DIAGNOSIS — Z79899 Other long term (current) drug therapy: Secondary | ICD-10-CM | POA: Diagnosis not present

## 2023-10-11 DIAGNOSIS — Z7901 Long term (current) use of anticoagulants: Secondary | ICD-10-CM | POA: Diagnosis not present

## 2023-10-11 DIAGNOSIS — Z806 Family history of leukemia: Secondary | ICD-10-CM

## 2023-10-11 DIAGNOSIS — Z7902 Long term (current) use of antithrombotics/antiplatelets: Secondary | ICD-10-CM

## 2023-10-11 DIAGNOSIS — Z9889 Other specified postprocedural states: Secondary | ICD-10-CM | POA: Diagnosis not present

## 2023-10-11 DIAGNOSIS — T82868A Thrombosis of vascular prosthetic devices, implants and grafts, initial encounter: Secondary | ICD-10-CM | POA: Diagnosis present

## 2023-10-11 DIAGNOSIS — Z87891 Personal history of nicotine dependence: Secondary | ICD-10-CM | POA: Diagnosis not present

## 2023-10-11 DIAGNOSIS — Z8249 Family history of ischemic heart disease and other diseases of the circulatory system: Secondary | ICD-10-CM | POA: Diagnosis not present

## 2023-10-11 DIAGNOSIS — F419 Anxiety disorder, unspecified: Secondary | ICD-10-CM | POA: Diagnosis present

## 2023-10-11 DIAGNOSIS — L03116 Cellulitis of left lower limb: Secondary | ICD-10-CM | POA: Diagnosis present

## 2023-10-11 DIAGNOSIS — Y848 Other medical procedures as the cause of abnormal reaction of the patient, or of later complication, without mention of misadventure at the time of the procedure: Secondary | ICD-10-CM | POA: Diagnosis present

## 2023-10-11 DIAGNOSIS — E785 Hyperlipidemia, unspecified: Secondary | ICD-10-CM | POA: Diagnosis present

## 2023-10-11 DIAGNOSIS — Z751 Person awaiting admission to adequate facility elsewhere: Secondary | ICD-10-CM

## 2023-10-11 DIAGNOSIS — I70261 Atherosclerosis of native arteries of extremities with gangrene, right leg: Secondary | ICD-10-CM | POA: Diagnosis present

## 2023-10-11 DIAGNOSIS — L03119 Cellulitis of unspecified part of limb: Secondary | ICD-10-CM | POA: Diagnosis present

## 2023-10-11 DIAGNOSIS — T82856A Stenosis of peripheral vascular stent, initial encounter: Secondary | ICD-10-CM | POA: Diagnosis not present

## 2023-10-11 DIAGNOSIS — I739 Peripheral vascular disease, unspecified: Secondary | ICD-10-CM | POA: Diagnosis present

## 2023-10-11 DIAGNOSIS — F17200 Nicotine dependence, unspecified, uncomplicated: Secondary | ICD-10-CM | POA: Diagnosis present

## 2023-10-11 DIAGNOSIS — I743 Embolism and thrombosis of arteries of the lower extremities: Secondary | ICD-10-CM | POA: Diagnosis not present

## 2023-10-11 DIAGNOSIS — L03115 Cellulitis of right lower limb: Secondary | ICD-10-CM

## 2023-10-11 LAB — CBG MONITORING, ED
Glucose-Capillary: 71 mg/dL (ref 70–99)
Glucose-Capillary: 117 mg/dL — ABNORMAL HIGH (ref 70–99)

## 2023-10-11 LAB — COMPREHENSIVE METABOLIC PANEL
ALT: 13 U/L (ref 0–44)
AST: 22 U/L (ref 15–41)
Albumin: 3 g/dL — ABNORMAL LOW (ref 3.5–5.0)
Alkaline Phosphatase: 80 U/L (ref 38–126)
Anion gap: 16 — ABNORMAL HIGH (ref 5–15)
BUN: 11 mg/dL (ref 8–23)
CO2: 25 mmol/L (ref 22–32)
Calcium: 8.9 mg/dL (ref 8.9–10.3)
Chloride: 93 mmol/L — ABNORMAL LOW (ref 98–111)
Creatinine, Ser: 1.02 mg/dL (ref 0.61–1.24)
GFR, Estimated: >60 mL/min (ref >60)
Glucose, Bld: 136 mg/dL — ABNORMAL HIGH (ref 70–99)
Potassium: 4.1 mmol/L (ref 3.5–5.1)
Sodium: 134 mmol/L — ABNORMAL LOW (ref 135–145)
Total Bilirubin: 0.4 mg/dL (ref <1.2)
Total Protein: 7.7 g/dL (ref 6.5–8.1)

## 2023-10-11 LAB — CBC WITH DIFFERENTIAL/PLATELET
Abs Immature Granulocytes: 0.22 10*3/uL — ABNORMAL HIGH (ref 0.00–0.07)
Basophils Absolute: 0 10*3/uL (ref 0.0–0.1)
Basophils Relative: 0 %
Eosinophils Absolute: 0 10*3/uL (ref 0.0–0.5)
Eosinophils Relative: 0 %
HCT: 37.2 % — ABNORMAL LOW (ref 39.0–52.0)
Hemoglobin: 12.5 g/dL — ABNORMAL LOW (ref 13.0–17.0)
Immature Granulocytes: 1 %
Lymphocytes Relative: 3 %
Lymphs Abs: 0.7 10*3/uL (ref 0.7–4.0)
MCH: 29.7 pg (ref 26.0–34.0)
MCHC: 33.6 g/dL (ref 30.0–36.0)
MCV: 88.4 fL (ref 80.0–100.0)
Monocytes Absolute: 0.9 10*3/uL (ref 0.1–1.0)
Monocytes Relative: 4 %
Neutro Abs: 20.4 10*3/uL — ABNORMAL HIGH (ref 1.7–7.7)
Neutrophils Relative %: 92 %
Platelets: 664 10*3/uL — ABNORMAL HIGH (ref 150–400)
RBC: 4.21 MIL/uL — ABNORMAL LOW (ref 4.22–5.81)
RDW: 15.4 % (ref 11.5–15.5)
WBC: 22.2 10*3/uL — ABNORMAL HIGH (ref 4.0–10.5)
nRBC: 0 % (ref 0.0–0.2)

## 2023-10-11 LAB — SEDIMENTATION RATE: Sed Rate: 70 mm/h — ABNORMAL HIGH (ref 0–20)

## 2023-10-11 LAB — LACTIC ACID, PLASMA
Lactic Acid, Venous: 2.9 mmol/L (ref 0.5–1.9)
Lactic Acid, Venous: 5 mmol/L (ref 0.5–1.9)
Lactic Acid, Venous: 2.1 mmol/L (ref 0.5–1.9)

## 2023-10-11 LAB — HEPARIN LEVEL (UNFRACTIONATED): Heparin Unfractionated: 0.65 [IU]/mL (ref 0.30–0.70)

## 2023-10-11 LAB — PROCALCITONIN: Procalcitonin: 1.59 ng/mL

## 2023-10-11 LAB — PROTIME-INR
INR: 1.1 (ref 0.8–1.2)
Prothrombin Time: 14.6 s (ref 11.4–15.2)

## 2023-10-11 LAB — APTT: aPTT: 47 s — ABNORMAL HIGH (ref 24–36)

## 2023-10-11 MED ORDER — SODIUM CHLORIDE 0.9% FLUSH
3.0000 mL | Freq: Two times a day (BID) | INTRAVENOUS | Status: DC
  Administered 2023-10-11 – 2023-10-15 (×7): 3 mL via INTRAVENOUS

## 2023-10-11 MED ORDER — HEPARIN (PORCINE) 25000 UT/250ML-% IV SOLN
2050.0000 [IU]/h | INTRAVENOUS | Status: DC
Start: 2023-10-11 — End: 2023-10-15
  Administered 2023-10-11: 1000 [IU]/h via INTRAVENOUS
  Administered 2023-10-13: 1550 [IU]/h via INTRAVENOUS
  Administered 2023-10-13: 1400 [IU]/h via INTRAVENOUS
  Administered 2023-10-14 (×2): 1900 [IU]/h via INTRAVENOUS
  Administered 2023-10-15: 2050 [IU]/h via INTRAVENOUS
  Filled 2023-10-11 (×6): qty 250

## 2023-10-11 MED ORDER — MORPHINE SULFATE (PF) 4 MG/ML IV SOLN
4.0000 mg | Freq: Once | INTRAVENOUS | Status: AC
  Administered 2023-10-11: 4 mg via INTRAVENOUS
  Filled 2023-10-11: qty 1

## 2023-10-11 MED ORDER — SODIUM CHLORIDE 0.9 % IV SOLN: INTRAVENOUS | Status: DC

## 2023-10-11 MED ORDER — METRONIDAZOLE 500 MG/100ML IV SOLN
500.0000 mg | Freq: Once | INTRAVENOUS | Status: AC
  Administered 2023-10-11: 500 mg via INTRAVENOUS
  Filled 2023-10-11: qty 100

## 2023-10-11 MED ORDER — ONDANSETRON HCL 4 MG PO TABS
4.0000 mg | ORAL_TABLET | Freq: Four times a day (QID) | ORAL | Status: DC | PRN
Start: 2023-10-11 — End: 2023-10-19
  Administered 2023-10-19: 4 mg via ORAL
  Filled 2023-10-11: qty 1

## 2023-10-11 MED ORDER — DOCUSATE SODIUM 100 MG PO CAPS
100.0000 mg | ORAL_CAPSULE | Freq: Two times a day (BID) | ORAL | Status: DC
  Administered 2023-10-11 – 2023-10-19 (×13): 100 mg via ORAL
  Filled 2023-10-11 (×16): qty 1

## 2023-10-11 MED ORDER — METRONIDAZOLE 500 MG/100ML IV SOLN
500.0000 mg | Freq: Two times a day (BID) | INTRAVENOUS | Status: DC
  Administered 2023-10-12 – 2023-10-16 (×8): 500 mg via INTRAVENOUS
  Filled 2023-10-11 (×8): qty 100

## 2023-10-11 MED ORDER — SODIUM CHLORIDE 0.9 % IV SOLN
2.0000 g | Freq: Once | INTRAVENOUS | Status: AC
  Administered 2023-10-11: 2 g via INTRAVENOUS
  Filled 2023-10-11: qty 12.5

## 2023-10-11 MED ORDER — ONDANSETRON HCL 4 MG/2ML IJ SOLN: 4.0000 mg | Freq: Four times a day (QID) | INTRAMUSCULAR | Status: DC | PRN

## 2023-10-11 MED ORDER — ACETAMINOPHEN 325 MG PO TABS: 650.0000 mg | ORAL_TABLET | Freq: Four times a day (QID) | ORAL | Status: DC | PRN

## 2023-10-11 MED ORDER — HYDRALAZINE HCL 20 MG/ML IJ SOLN: 5.0000 mg | INTRAMUSCULAR | Status: DC | PRN

## 2023-10-11 MED ORDER — ACETAMINOPHEN 650 MG RE SUPP: 650.0000 mg | Freq: Four times a day (QID) | RECTAL | Status: DC | PRN

## 2023-10-11 MED ORDER — ATORVASTATIN CALCIUM 20 MG PO TABS
40.0000 mg | ORAL_TABLET | Freq: Every day | ORAL | Status: DC
  Administered 2023-10-11 – 2023-10-19 (×8): 40 mg via ORAL
  Filled 2023-10-11 (×3): qty 2
  Filled 2023-10-11: qty 0.5
  Filled 2023-10-11: qty 1
  Filled 2023-10-11 (×3): qty 2
  Filled 2023-10-11: qty 1

## 2023-10-11 MED ORDER — OXYCODONE-ACETAMINOPHEN 7.5-325 MG PO TABS
1.0000 | ORAL_TABLET | ORAL | Status: DC | PRN
  Administered 2023-10-11 – 2023-10-16 (×13): 1 via ORAL
  Filled 2023-10-11 (×16): qty 1

## 2023-10-11 MED ORDER — LACTATED RINGERS IV BOLUS (SEPSIS)
500.0000 mL | Freq: Once | INTRAVENOUS | Status: AC
  Administered 2023-10-11: 500 mL via INTRAVENOUS

## 2023-10-11 MED ORDER — VANCOMYCIN HCL IN DEXTROSE 1-5 GM/200ML-% IV SOLN
1000.0000 mg | Freq: Two times a day (BID) | INTRAVENOUS | Status: DC
  Administered 2023-10-12: 1000 mg via INTRAVENOUS
  Filled 2023-10-11 (×2): qty 200

## 2023-10-11 MED ORDER — INSULIN ASPART 100 UNIT/ML IJ SOLN
0.0000 [IU] | Freq: Three times a day (TID) | INTRAMUSCULAR | Status: DC
  Filled 2023-10-11: qty 1

## 2023-10-11 MED ORDER — SODIUM CHLORIDE 0.9 % IV BOLUS
500.0000 mL | Freq: Once | INTRAVENOUS | Status: AC
Start: 2023-10-11 — End: 2023-10-11
  Administered 2023-10-11: 500 mL via INTRAVENOUS

## 2023-10-11 MED ORDER — PANTOPRAZOLE SODIUM 40 MG PO TBEC
40.0000 mg | DELAYED_RELEASE_TABLET | Freq: Every day | ORAL | Status: DC
  Administered 2023-10-11 – 2023-10-19 (×8): 40 mg via ORAL
  Filled 2023-10-11 (×8): qty 1

## 2023-10-11 MED ORDER — LACTATED RINGERS IV BOLUS (SEPSIS)
1000.0000 mL | Freq: Once | INTRAVENOUS | Status: AC
  Administered 2023-10-11: 1000 mL via INTRAVENOUS

## 2023-10-11 MED ORDER — MORPHINE SULFATE (PF) 2 MG/ML IV SOLN
2.0000 mg | INTRAVENOUS | Status: DC | PRN
  Administered 2023-10-11 – 2023-10-16 (×21): 2 mg via INTRAVENOUS
  Filled 2023-10-11 (×22): qty 1

## 2023-10-11 MED ORDER — BUSPIRONE HCL 10 MG PO TABS
5.0000 mg | ORAL_TABLET | Freq: Three times a day (TID) | ORAL | Status: DC | PRN
  Administered 2023-10-11: 5 mg via ORAL
  Filled 2023-10-11: qty 1

## 2023-10-11 MED ORDER — POLYETHYLENE GLYCOL 3350 17 G PO PACK: 17.0000 g | PACK | Freq: Every day | ORAL | Status: DC | PRN

## 2023-10-11 MED ORDER — IOHEXOL 350 MG/ML SOLN
100.0000 mL | Freq: Once | INTRAVENOUS | Status: AC | PRN
  Administered 2023-10-11: 100 mL via INTRAVENOUS

## 2023-10-11 MED ORDER — INSULIN ASPART 100 UNIT/ML IJ SOLN: 0.0000 [IU] | Freq: Every day | INTRAMUSCULAR | Status: DC

## 2023-10-11 MED ORDER — VANCOMYCIN HCL 750 MG/150ML IV SOLN
750.0000 mg | Freq: Once | INTRAVENOUS | Status: AC
  Administered 2023-10-11: 750 mg via INTRAVENOUS
  Filled 2023-10-11: qty 150

## 2023-10-11 MED ORDER — BISACODYL 5 MG PO TBEC: 5.0000 mg | DELAYED_RELEASE_TABLET | Freq: Every day | ORAL | Status: DC | PRN

## 2023-10-11 MED ORDER — GABAPENTIN 300 MG PO CAPS
300.0000 mg | ORAL_CAPSULE | Freq: Two times a day (BID) | ORAL | Status: DC
  Administered 2023-10-11 – 2023-10-19 (×16): 300 mg via ORAL
  Filled 2023-10-11 (×16): qty 1

## 2023-10-11 MED ORDER — VANCOMYCIN HCL IN DEXTROSE 1-5 GM/200ML-% IV SOLN
1000.0000 mg | Freq: Once | INTRAVENOUS | Status: AC
  Administered 2023-10-11: 1000 mg via INTRAVENOUS
  Filled 2023-10-11: qty 200

## 2023-10-11 MED ORDER — SODIUM CHLORIDE 0.9 % IV SOLN
2.0000 g | INTRAVENOUS | Status: DC
  Administered 2023-10-11 – 2023-10-15 (×5): 2 g via INTRAVENOUS
  Filled 2023-10-11 (×5): qty 20

## 2023-10-11 NOTE — Sepsis Progress Note (Signed)
Sepsis protocol monitored by eLink ?

## 2023-10-11 NOTE — ED Provider Notes (Signed)
[facility] Provider Note    Event Date/Time   First MD Initiated Contact with Patient 10/11/23 1251     (approximate)   History   Leg Pain   HPI Brendan Williams is a 62 y.o. male with history of peripheral arterial disease, prior right midfoot amputation presenting today for leg pain.  Patient was seen 1 week ago in the ED for redness to his right foot.  Was diagnosed with cellulitis and discharged on Bactrim.  Has had worsening pain and swelling and redness to that site.  Has had chills but no fevers.  No nausea or vomiting.  No abdominal pain or difficulty breathing.  Reviewed prior vascular surgery notes.     Physical Exam   Triage Vital Signs: ED Triage Vitals  Encounter Vitals Group     BP 10/11/23 1132 130/79     Systolic BP Percentile --      Diastolic BP Percentile --      Pulse Rate 10/11/23 1132 (!) 124     Resp 10/11/23 1132 20     Temp 10/11/23 1132 98.4 F (36.9 C)     Temp Source 10/11/23 1132 Oral     SpO2 --      Weight --      Height --      Head Circumference --      Peak Flow --      Pain Score 10/11/23 1113 10     Pain Loc --      Pain Education --      Exclude from Growth Chart --     Most recent vital signs: Vitals:   10/11/23 1132  BP: 130/79  Pulse: (!) 124  Resp: 20  Temp: 98.4 F (36.9 C)   I have reviewed the vital signs. General:  Awake, alert, no acute distress. Head:  Normocephalic, Atraumatic. EENT:  PERRL, EOMI, Oral mucosa pink and moist, Neck is supple. Cardiovascular: Regular rate, 2+ distal pulses. Respiratory:  Normal respiratory effort, symmetrical expansion, no distress.   Extremities:  Moving all four extremities through full ROM without pain.   Neuro:  Alert and oriented.  Interacting appropriately.   Skin: Severe erythema and signs of necrotic change at the right foot where prior amputation has been done.  Pain sensation throughout.  Erythema extends up to mid tibia.  See media tab for  photo Psych: Appropriate affect.    ED Results / Procedures / Treatments   Labs (all labs ordered are listed, but only abnormal results are displayed) Labs Reviewed  CBC WITH DIFFERENTIAL/PLATELET - Abnormal; Notable for the following components:      Result Value   WBC 22.2 (*)    RBC 4.21 (*)    Hemoglobin 12.5 (*)    HCT 37.2 (*)    Platelets 664 (*)    Neutro Abs 20.4 (*)    Abs Immature Granulocytes 0.22 (*)    All other components within normal limits  COMPREHENSIVE METABOLIC PANEL - Abnormal; Notable for the following components:   Sodium 134 (*)    Chloride 93 (*)    Glucose, Bld 136 (*)    Albumin 3.0 (*)    Anion gap 16 (*)    All other components within normal limits  LACTIC ACID, PLASMA - Abnormal; Notable for the following components:   Lactic Acid, Venous 2.9 (*)    All other components within normal limits  CULTURE, BLOOD (ROUTINE X 2)  CULTURE, BLOOD (ROUTINE X 2)  PROTIME-INR  APTT  HEPARIN LEVEL (UNFRACTIONATED)     EKG    RADIOLOGY    PROCEDURES:  Critical Care performed: Yes, see critical care procedure note(s)  .Critical Care  Performed by: Janith Lima, MD Authorized by: Janith Lima, MD   Critical care provider statement:    Critical care time (minutes):  35   Critical care was necessary to treat or prevent imminent or life-threatening deterioration of the following conditions:  Sepsis   Critical care was time spent personally by me on the following activities:  Development of treatment plan with patient or surrogate, discussions with consultants, evaluation of patient's response to treatment, examination of patient, ordering and review of laboratory studies, ordering and review of radiographic studies, ordering and performing treatments and interventions, pulse oximetry, re-evaluation of patient's condition and review of old charts   I assumed direction of critical care for this patient from another provider in my specialty: no      Care discussed with: admitting provider      MEDICATIONS ORDERED IN ED: Medications  lactated ringers bolus 1,000 mL (0 mLs Intravenous Stopped 10/11/23 1453)    And  lactated ringers bolus 1,000 mL (has no administration in time range)    And  lactated ringers bolus 500 mL (has no administration in time range)  ceFEPIme (MAXIPIME) 2 g in sodium chloride 0.9 % 100 mL IVPB (2 g Intravenous New Bag/Given 10/11/23 1446)  metroNIDAZOLE (FLAGYL) IVPB 500 mg (has no administration in time range)  vancomycin (VANCOCIN) IVPB 1000 mg/200 mL premix (has no administration in time range)  iohexol (OMNIPAQUE) 350 MG/ML injection 100 mL (has no administration in time range)  heparin ADULT infusion 100 units/mL (25000 units/244mL) (has no administration in time range)  morphine (PF) 4 MG/ML injection 4 mg (4 mg Intravenous Given 10/11/23 1423)     IMPRESSION / MDM / ASSESSMENT AND PLAN / ED COURSE  I reviewed the triage vital signs and the nursing notes.                              Differential diagnosis includes, but is not limited to, cellulitis, sepsis, gangrene, peripheral arterial disease  Patient's presentation is most consistent with acute presentation with potential threat to life or bodily function.  Patient is a 62 year old male presenting today for worsening right lower extremity pain.  Recent diagnosis of cellulitis and well-known history of chronic peripheral artery disease.  Vascular previously talked with patient about higher level of amputation on the leg but patient declined and was discharged earlier in December.  Today he is tachycardic with notable leukocytosis at 22.  Lactic acid elevated at 2.9.  Anion gap of 16.  Patient is septic from his cellulitis and lower extremity wounds now.  Will start on broad-spectrum IV antibiotics and obtain blood cultures.  Patient given 1 L fluids.  Vascular surgery also came down for discussion and recommends initiation of heparin drip given  severe peripheral artery disease.  Recommend CTA runoff for presurgical planning.  Patient admitted to hospitalist for further care.  The patient is on the cardiac monitor to evaluate for evidence of arrhythmia and/or significant heart rate changes. Clinical Course as of 10/11/23 1455  Thu Oct 11, 2023  1341 Vasc - needs AKA. CTA runoff. Start IV antibiotics. Start heparin [DW]    Clinical Course User Index [DW] Janith Lima, MD     FINAL CLINICAL IMPRESSION(S) / ED  DIAGNOSES   Final diagnoses:  Sepsis, due to unspecified organism, unspecified whether acute organ dysfunction present (HCC)  Cellulitis of right lower extremity  Lactic acidosis  Peripheral artery disease (HCC)     Rx / DC Orders   ED Discharge Orders     None        Note:  This document was prepared using Dragon voice recognition software and may include unintentional dictation errors.   Janith Lima, MD 10/11/23 [phone]

## 2023-10-11 NOTE — H&P (View-Only) (Signed)
 Hospital Consult    Reason for Consult:  Critical Ischemia to Right Lower Extremity Requesting Physician:  Dr. Jonah Blue MD MRN #:  [id number]  History of Present Illness: This is a 62 y.o. male who is well-known to vein and vascular here at [facility].  He has a significant history of PAD with multiple angiograms and right TMA presenting to the emergency room today with increased right leg pain, erythema and ischemia.  On 09/28/2023 patient underwent right lower extremity angiogram with lysis catheter.  At that time his right lower extremity was unable to be revascularized.  We expected to admit the patient for a right above-the-knee amputation on 09/29/2023 but the patient refused and decided he was going to go home.  Plan was to come back next week for his amputation.  Patient did not show up as scheduled and returns to the emergency room today for the above-stated right lower extremity ischemia and pain.  On exam today the patient is in 10 out of 10 pain with his right lower extremity being cold to touch, ischemic and painful to touch.  I was unable to find a pulse anywhere in his leg including his femoral artery.  I asked Dr. Anner Crete from the emergency room to start patient on a heparin drip, start patient on antibiotics due to an elevated white blood cell count and lactic acid, and admit the patient for right lower extremity above-the-knee amputation tomorrow on 10/12/2023.  I requested a CTA of the abdomen and pelvis with bilateral lower extremity runoff to better evaluate the patient's ability to heal her right above-the-knee amputation.  Past Medical History:  Diagnosis Date   Anxiety    situational   Asthma    as a child   Atherosclerosis of native arteries of extremity with intermittent claudication (HCC)    Cellulitis and abscess of toe of right foot    Critical limb ischemia of right lower extremity (HCC)    Current smoker    Quit 04/16/23   Peripheral arterial disease (HCC)      Past Surgical History:  Procedure Laterality Date   AMPUTATION TOE Right 12/02/2019   Procedure: AMPUTATION RIGHT 5TH TOE;  Surgeon: Rosetta Posner, DPM;  Location: [facility];  Service: Podiatry;  Laterality: Right;   APPLICATION OF WOUND VAC Bilateral 11/28/2019   Procedure: APPLICATION OF WOUND VAC;  Surgeon: Annice Needy, MD;  Location: [facility];  Service: Vascular;  Laterality: Bilateral;  Prevena    BACK SURGERY     lower ruptured disk   CENTRAL VENOUS CATHETER INSERTION  11/28/2019   Procedure: INSERTION CENTRAL LINE ADULT;  Surgeon: Annice Needy, MD;  Location: [facility];  Service: Vascular;;   ENDARTERECTOMY FEMORAL Bilateral 11/28/2019   Procedure: ENDARTERECTOMY FEMORAL;  Surgeon: Annice Needy, MD;  Location: [facility];  Service: Vascular;  Laterality: Bilateral;   ENDARTERECTOMY FEMORAL Right 05/23/2023   Procedure: ENDARTERECTOMY FEMORAL REDO;  Surgeon: Annice Needy, MD;  Location: [facility];  Service: Vascular;  Laterality: Right;   ENDOVASCULAR REPAIR/STENT GRAFT Right 05/23/2023   Procedure: ENDOVASCULAR REPAIR/STENT GRAFT;  Surgeon: Annice Needy, MD;  Location: [facility];  Service: Vascular;  Laterality: Right;  Right SFA Stents   LOWER EXTREMITY ANGIOGRAPHY Right 11/14/2019   Procedure: LOWER EXTREMITY ANGIOGRAPHY;  Surgeon: Annice Needy, MD;  Location: [facility];  Service: Cardiovascular;  Laterality: Right;   LOWER EXTREMITY ANGIOGRAPHY Right 12/19/2021   Procedure: Lower Extremity Angiography;  Surgeon: Annice Needy, MD;  Location: [facility];  Service: Cardiovascular;  Laterality: Right;   LOWER EXTREMITY ANGIOGRAPHY Right 01/30/2022   Procedure: Lower Extremity Angiography;  Surgeon: Annice Needy, MD;  Location: [facility];  Service: Cardiovascular;  Laterality: Right;   LOWER EXTREMITY ANGIOGRAPHY Right 05/21/2023   Procedure: Lower Extremity Angiography;  Surgeon: Annice Needy, MD;  Location: [facility];  Service: Cardiovascular;   Laterality: Right;   LOWER EXTREMITY ANGIOGRAPHY Right 09/28/2023   Procedure: Lower Extremity Angiography;  Surgeon: Annice Needy, MD;  Location: [facility];  Service: Cardiovascular;  Laterality: Right;   MECHANICAL THROMBECTOMY WITH AORTOGRAM AND INTERVENTION  05/23/2023   Procedure: MECHANICAL THROMBECTOMY right popliteal artery;  Surgeon: Annice Needy, MD;  Location: [facility];  Service: Vascular;;   STOMACH SURGERY     exploratory   TONSILECTOMY, ADENOIDECTOMY, BILATERAL MYRINGOTOMY AND TUBES     TONSILLECTOMY     TRANSMETATARSAL AMPUTATION Right 06/21/2023   Procedure: TRANSMETATARSAL AMPUTATION;  Surgeon: Rosetta Posner, DPM;  Location: [facility];  Service: Orthopedics/Podiatry;  Laterality: Right;    No Known Allergies  Prior to Admission medications   Medication Sig Start Date End Date Taking? Authorizing Provider  amoxicillin-clavulanate (AUGMENTIN) 875-125 MG tablet Take 1 tablet by mouth 2 (two) times daily. 06/21/23   Rosetta Posner, DPM  apixaban (ELIQUIS) 5 MG TABS tablet Take 1 tablet (5 mg total) by mouth 2 (two) times daily. 06/21/23 08/20/23  Rosetta Posner, DPM  aspirin EC 81 MG tablet Take 1 tablet (81 mg total) by mouth daily. 06/21/23   Rosetta Posner, DPM  atorvastatin (LIPITOR) 40 MG tablet Take 1 tablet (40 mg total) by mouth daily. 05/30/23 07/29/23  Lonia Blood, MD  clopidogrel (PLAVIX) 75 MG tablet Take 1 tablet (75 mg total) by mouth daily. 06/21/23   Rosetta Posner, DPM  dexlansoprazole (DEXILANT) 60 MG capsule Take 60 mg by mouth as needed.    [provider]  gabapentin (NEURONTIN) 300 MG capsule Take 1 capsule (300 mg total) by mouth 2 (two) times daily. 05/30/23 07/29/23  Lonia Blood, MD  leptospermum manuka honey (MEDIHONEY) PSTE paste Apply 1 Application topically daily. 06/20/23   Georgiana Spinner, NP  leptospermum manuka honey (MEDIHONEY) PSTE paste Apply 1 Application topically daily. 07/18/23   Georgiana Spinner, NP  ondansetron  (ZOFRAN) 4 MG tablet Take 1 tablet (4 mg total) by mouth every 8 (eight) hours as needed for nausea or vomiting. 06/21/23   Rosetta Posner, DPM  oxyCODONE-acetaminophen (PERCOCET) 7.5-325 MG tablet Take 1 tablet by mouth every 4 (four) hours as needed for severe pain (pain score 7-10). 10/04/23 10/03/24  Georgiana Spinner, NP    Social History   Socioeconomic History   Marital status: Married    Spouse name: Idrovo,BRENDA (Spouse)   Number of children: Not on file   Years of education: Not on file   Highest education level: Not on file  Occupational History   Occupation: gutter work  Tobacco Use   Smoking status: Former    Current packs/day: 0.00    Average packs/day: 1 pack/day for 40.5 years (40.5 ttl pk-yrs)    Types: Cigarettes    Start date: 33    Quit date: 04/16/2023    Years since quitting: 0.4   Smokeless tobacco: Never   Tobacco comments:    Last smoked 3-4 days ago  Vaping Use   Vaping status: Never Used  Substance and Sexual Activity   Alcohol  use: Yes    Alcohol/week: 5.0 standard drinks of alcohol    Types: 5 Cans of beer per week    Comment: 4-5 beers a week   Drug use: Yes    Frequency: 7.0 times per week    Types: Marijuana    Comment: a joint daily   Sexual activity: Yes  Other Topics Concern   Not on file  Social History Narrative   Live in private residence with wife, Steward Drone. Had grandson and step daughter in the home   Social Drivers of Health   Financial Resource Strain: Not on file  Food Insecurity: No Food Insecurity (05/18/2023)   Hunger Vital Sign    Worried About Running Out of Food in the Last Year: Never true    Ran Out of Food in the Last Year: Never true  Transportation Needs: No Transportation Needs (05/18/2023)   PRAPARE - Administrator, Civil Service (Medical): No    Lack of Transportation (Non-Medical): No  Physical Activity: Not on file  Stress: Not on file  Social Connections: Not on file  Intimate Partner Violence:  Patient Unable To Answer (05/18/2023)   Humiliation, Afraid, Rape, and Kick questionnaire    Fear of Current or Ex-Partner: Patient unable to answer    Emotionally Abused: Patient unable to answer    Physically Abused: Patient unable to answer    Sexually Abused: Patient unable to answer     Family History  Problem Relation Age of Onset   Leukemia Mother    Heart disease Father        a. CABG   Heart disease Sister        a. MVR   Cancer Sister    Cancer Brother    CAD Brother        a. CABG    ROS: Otherwise negative unless mentioned in HPI  Physical Examination  Vitals:   10/11/23 1132  BP: 130/79  Pulse: (!) 124  Resp: 20  Temp: 98.4 F (36.9 C)   There is no height or weight on file to calculate BMI.  General:  WDWN in NAD Gait: Not observed HENT: WNL, normocephalic Pulmonary: normal non-labored breathing, without Rales, rhonchi,  wheezing Cardiac: regular, tachycardia in the 120s, without  Murmurs, rubs or gallops; without carotid bruits Abdomen: Positive bowel sounds throughout, soft, NT/ND, no masses Skin: without rashes Vascular Exam/Pulses: Unable to palpate any pulses in right lower extremity including femoral artery pulse.  Right lower extremity is cold to touch from proximal thigh to TMA.  Left lower extremity cool to touch unable to palpate pulses. Extremities: with ischemic changes, without Gangrene , with cellulitis; without open wounds;  Musculoskeletal: no muscle wasting or atrophy  Neurologic: A&O X 3;  No focal weakness or paresthesias are detected; speech is fluent/normal Psychiatric:  The pt has Normal affect. Lymph:  Unremarkable  CBC    Component Value Date/Time   WBC 22.2 (H) 10/11/2023 1133   RBC 4.21 (L) 10/11/2023 1133   HGB 12.5 (L) 10/11/2023 1133   HCT 37.2 (L) 10/11/2023 1133   PLT 664 (H) 10/11/2023 1133   MCV 88.4 10/11/2023 1133   MCH 29.7 10/11/2023 1133   MCHC 33.6 10/11/2023 1133   RDW 15.4 10/11/2023 1133   LYMPHSABS  0.7 10/11/2023 1133   MONOABS 0.9 10/11/2023 1133   EOSABS 0.0 10/11/2023 1133   BASOSABS 0.0 10/11/2023 1133    BMET    Component Value Date/Time   NA 134 (L)  10/11/2023 1133   K 4.1 10/11/2023 1133   CL 93 (L) 10/11/2023 1133   CO2 25 10/11/2023 1133   GLUCOSE 136 (H) 10/11/2023 1133   BUN 11 10/11/2023 1133   CREATININE 1.02 10/11/2023 1133   CALCIUM 8.9 10/11/2023 1133   GFRNONAA >60 10/11/2023 1133   GFRAA >60 12/04/2019 0523    COAGS: Lab Results  Component Value Date   INR 1.1 10/11/2023   INR 1.0 10/03/2023   INR 1.1 09/27/2023     Non-Invasive Vascular Imaging:   CTA abdomen/pelvis with runoff to lower extremities ordered.  Statin:  Yes.   Beta Blocker:  No. Aspirin:  Yes.   ACEI:  No. ARB:  No. CCB use:  No Other antiplatelets/anticoagulants:  Yes.   Plavix 75 mg daily and Eliquis 5 mg twice daily.   ASSESSMENT/PLAN: This is a 62 y.o. male who returns to [facility] emergency department for right lower extremity ischemia from his right proximal thigh to his foot.  Patient underwent right lower extremity angiogram on 09/28/2023 that was unsuccessful.  Patient returns today in extreme pain with right lower extremity ischemia, elevated lactic acid and white blood cell count.  Plan Vascular surgery plans on taking the patient to the operating room tomorrow for right lower extremity above-the-knee amputation.  I discussed in detail with the patient the procedure, benefits, risk, and complications.  He verbalizes understanding wishes to proceed.  I answered all the patient's questions.  Patient will remain n.p.o. after midnight tonight for surgery tomorrow.   -I discussed the plan in detail with Dr. Festus Barren MD and he is in agreement with the plan.   [name] [facility] 10/11/2023 3:35 PM

## 2023-10-11 NOTE — Consult Note (Signed)
PHARMACY - ANTICOAGULATION CONSULT NOTE  Pharmacy Consult for IV Heparin Indication:  PAD on apixaban prior to admission / pending amputation  Patient Measurements:   Heparin Dosing Weight: 79.4 kg  Labs: Recent Labs    10/11/23 1133  HGB 12.5*  HCT 37.2*  PLT 664*  CREATININE 1.02    Estimated Creatinine Clearance: 83.2 mL/min (by C-G formula based on SCr of 1.02 mg/dL).   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 Pharmacy consulted to initiate and manage heparin infusion for PAD while home apixaban is on hold pending surgery.  Baseline aPTT, INR and heparin level are pending. Baseline CBC notable for mild anemia and thrombocytosis.   Goal of Therapy:  Heparin level 0.3-0.7 units/ml aPTT 66 - 102 seconds Monitor platelets by anticoagulation protocol: Yes   Plan:  --Start heparin infusion at 1000 units/hr on 12/26 at 2200 since last dose of apixaban was 12/26 AM --aPTT 6 hours after initiation of infusion. Follow until correlation established with heparin level --Daily CBC per protocol while on IV heparin  Tressie Ellis 10/11/2023,2:13 PM

## 2023-10-11 NOTE — Sepsis Progress Note (Signed)
Notified provider of need to order repeat lactic acid. ° °

## 2023-10-11 NOTE — Consult Note (Signed)
Hospital Consult    Reason for Consult:  Critical Ischemia to Right Lower Extremity Requesting Physician:  Dr. Jonah Blue MD MRN #:  [id number]  History of Present Illness: This is a 62 y.o. male who is well-known to vein and vascular here at [facility].  He has a significant history of PAD with multiple angiograms and right TMA presenting to the emergency room today with increased right leg pain, erythema and ischemia.  On 09/28/2023 patient underwent right lower extremity angiogram with lysis catheter.  At that time his right lower extremity was unable to be revascularized.  We expected to admit the patient for a right above-the-knee amputation on 09/29/2023 but the patient refused and decided he was going to go home.  Plan was to come back next week for his amputation.  Patient did not show up as scheduled and returns to the emergency room today for the above-stated right lower extremity ischemia and pain.  On exam today the patient is in 10 out of 10 pain with his right lower extremity being cold to touch, ischemic and painful to touch.  I was unable to find a pulse anywhere in his leg including his femoral artery.  I asked Dr. Anner Crete from the emergency room to start patient on a heparin drip, start patient on antibiotics due to an elevated white blood cell count and lactic acid, and admit the patient for right lower extremity above-the-knee amputation tomorrow on 10/12/2023.  I requested a CTA of the abdomen and pelvis with bilateral lower extremity runoff to better evaluate the patient's ability to heal her right above-the-knee amputation.  Past Medical History:  Diagnosis Date   Anxiety    situational   Asthma    as a child   Atherosclerosis of native arteries of extremity with intermittent claudication (HCC)    Cellulitis and abscess of toe of right foot    Critical limb ischemia of right lower extremity (HCC)    Current smoker    Quit 04/16/23   Peripheral arterial disease (HCC)      Past Surgical History:  Procedure Laterality Date   AMPUTATION TOE Right 12/02/2019   Procedure: AMPUTATION RIGHT 5TH TOE;  Surgeon: Rosetta Posner, DPM;  Location: [facility];  Service: Podiatry;  Laterality: Right;   APPLICATION OF WOUND VAC Bilateral 11/28/2019   Procedure: APPLICATION OF WOUND VAC;  Surgeon: Annice Needy, MD;  Location: [facility];  Service: Vascular;  Laterality: Bilateral;  Prevena    BACK SURGERY     lower ruptured disk   CENTRAL VENOUS CATHETER INSERTION  11/28/2019   Procedure: INSERTION CENTRAL LINE ADULT;  Surgeon: Annice Needy, MD;  Location: [facility];  Service: Vascular;;   ENDARTERECTOMY FEMORAL Bilateral 11/28/2019   Procedure: ENDARTERECTOMY FEMORAL;  Surgeon: Annice Needy, MD;  Location: [facility];  Service: Vascular;  Laterality: Bilateral;   ENDARTERECTOMY FEMORAL Right 05/23/2023   Procedure: ENDARTERECTOMY FEMORAL REDO;  Surgeon: Annice Needy, MD;  Location: [facility];  Service: Vascular;  Laterality: Right;   ENDOVASCULAR REPAIR/STENT GRAFT Right 05/23/2023   Procedure: ENDOVASCULAR REPAIR/STENT GRAFT;  Surgeon: Annice Needy, MD;  Location: [facility];  Service: Vascular;  Laterality: Right;  Right SFA Stents   LOWER EXTREMITY ANGIOGRAPHY Right 11/14/2019   Procedure: LOWER EXTREMITY ANGIOGRAPHY;  Surgeon: Annice Needy, MD;  Location: [facility];  Service: Cardiovascular;  Laterality: Right;   LOWER EXTREMITY ANGIOGRAPHY Right 12/19/2021   Procedure: Lower Extremity Angiography;  Surgeon: Annice Needy, MD;  Location: [facility];  Service: Cardiovascular;  Laterality: Right;   LOWER EXTREMITY ANGIOGRAPHY Right 01/30/2022   Procedure: Lower Extremity Angiography;  Surgeon: Annice Needy, MD;  Location: [facility];  Service: Cardiovascular;  Laterality: Right;   LOWER EXTREMITY ANGIOGRAPHY Right 05/21/2023   Procedure: Lower Extremity Angiography;  Surgeon: Annice Needy, MD;  Location: [facility];  Service: Cardiovascular;   Laterality: Right;   LOWER EXTREMITY ANGIOGRAPHY Right 09/28/2023   Procedure: Lower Extremity Angiography;  Surgeon: Annice Needy, MD;  Location: [facility];  Service: Cardiovascular;  Laterality: Right;   MECHANICAL THROMBECTOMY WITH AORTOGRAM AND INTERVENTION  05/23/2023   Procedure: MECHANICAL THROMBECTOMY right popliteal artery;  Surgeon: Annice Needy, MD;  Location: [facility];  Service: Vascular;;   STOMACH SURGERY     exploratory   TONSILECTOMY, ADENOIDECTOMY, BILATERAL MYRINGOTOMY AND TUBES     TONSILLECTOMY     TRANSMETATARSAL AMPUTATION Right 06/21/2023   Procedure: TRANSMETATARSAL AMPUTATION;  Surgeon: Rosetta Posner, DPM;  Location: [facility];  Service: Orthopedics/Podiatry;  Laterality: Right;    No Known Allergies  Prior to Admission medications   Medication Sig Start Date End Date Taking? Authorizing Provider  amoxicillin-clavulanate (AUGMENTIN) 875-125 MG tablet Take 1 tablet by mouth 2 (two) times daily. 06/21/23   Rosetta Posner, DPM  apixaban (ELIQUIS) 5 MG TABS tablet Take 1 tablet (5 mg total) by mouth 2 (two) times daily. 06/21/23 08/20/23  Rosetta Posner, DPM  aspirin EC 81 MG tablet Take 1 tablet (81 mg total) by mouth daily. 06/21/23   Rosetta Posner, DPM  atorvastatin (LIPITOR) 40 MG tablet Take 1 tablet (40 mg total) by mouth daily. 05/30/23 07/29/23  Lonia Blood, MD  clopidogrel (PLAVIX) 75 MG tablet Take 1 tablet (75 mg total) by mouth daily. 06/21/23   Rosetta Posner, DPM  dexlansoprazole (DEXILANT) 60 MG capsule Take 60 mg by mouth as needed.    [provider]  gabapentin (NEURONTIN) 300 MG capsule Take 1 capsule (300 mg total) by mouth 2 (two) times daily. 05/30/23 07/29/23  Lonia Blood, MD  leptospermum manuka honey (MEDIHONEY) PSTE paste Apply 1 Application topically daily. 06/20/23   Georgiana Spinner, NP  leptospermum manuka honey (MEDIHONEY) PSTE paste Apply 1 Application topically daily. 07/18/23   Georgiana Spinner, NP  ondansetron  (ZOFRAN) 4 MG tablet Take 1 tablet (4 mg total) by mouth every 8 (eight) hours as needed for nausea or vomiting. 06/21/23   Rosetta Posner, DPM  oxyCODONE-acetaminophen (PERCOCET) 7.5-325 MG tablet Take 1 tablet by mouth every 4 (four) hours as needed for severe pain (pain score 7-10). 10/04/23 10/03/24  Georgiana Spinner, NP    Social History   Socioeconomic History   Marital status: Married    Spouse name: Idrovo,BRENDA (Spouse)   Number of children: Not on file   Years of education: Not on file   Highest education level: Not on file  Occupational History   Occupation: gutter work  Tobacco Use   Smoking status: Former    Current packs/day: 0.00    Average packs/day: 1 pack/day for 40.5 years (40.5 ttl pk-yrs)    Types: Cigarettes    Start date: 33    Quit date: 04/16/2023    Years since quitting: 0.4   Smokeless tobacco: Never   Tobacco comments:    Last smoked 3-4 days ago  Vaping Use   Vaping status: Never Used  Substance and Sexual Activity   Alcohol  use: Yes    Alcohol/week: 5.0 standard drinks of alcohol    Types: 5 Cans of beer per week    Comment: 4-5 beers a week   Drug use: Yes    Frequency: 7.0 times per week    Types: Marijuana    Comment: a joint daily   Sexual activity: Yes  Other Topics Concern   Not on file  Social History Narrative   Live in private residence with wife, Steward Drone. Had grandson and step daughter in the home   Social Drivers of Health   Financial Resource Strain: Not on file  Food Insecurity: No Food Insecurity (05/18/2023)   Hunger Vital Sign    Worried About Running Out of Food in the Last Year: Never true    Ran Out of Food in the Last Year: Never true  Transportation Needs: No Transportation Needs (05/18/2023)   PRAPARE - Administrator, Civil Service (Medical): No    Lack of Transportation (Non-Medical): No  Physical Activity: Not on file  Stress: Not on file  Social Connections: Not on file  Intimate Partner Violence:  Patient Unable To Answer (05/18/2023)   Humiliation, Afraid, Rape, and Kick questionnaire    Fear of Current or Ex-Partner: Patient unable to answer    Emotionally Abused: Patient unable to answer    Physically Abused: Patient unable to answer    Sexually Abused: Patient unable to answer     Family History  Problem Relation Age of Onset   Leukemia Mother    Heart disease Father        a. CABG   Heart disease Sister        a. MVR   Cancer Sister    Cancer Brother    CAD Brother        a. CABG    ROS: Otherwise negative unless mentioned in HPI  Physical Examination  Vitals:   10/11/23 1132  BP: 130/79  Pulse: (!) 124  Resp: 20  Temp: 98.4 F (36.9 C)   There is no height or weight on file to calculate BMI.  General:  WDWN in NAD Gait: Not observed HENT: WNL, normocephalic Pulmonary: normal non-labored breathing, without Rales, rhonchi,  wheezing Cardiac: regular, tachycardia in the 120s, without  Murmurs, rubs or gallops; without carotid bruits Abdomen: Positive bowel sounds throughout, soft, NT/ND, no masses Skin: without rashes Vascular Exam/Pulses: Unable to palpate any pulses in right lower extremity including femoral artery pulse.  Right lower extremity is cold to touch from proximal thigh to TMA.  Left lower extremity cool to touch unable to palpate pulses. Extremities: with ischemic changes, without Gangrene , with cellulitis; without open wounds;  Musculoskeletal: no muscle wasting or atrophy  Neurologic: A&O X 3;  No focal weakness or paresthesias are detected; speech is fluent/normal Psychiatric:  The pt has Normal affect. Lymph:  Unremarkable  CBC    Component Value Date/Time   WBC 22.2 (H) 10/11/2023 1133   RBC 4.21 (L) 10/11/2023 1133   HGB 12.5 (L) 10/11/2023 1133   HCT 37.2 (L) 10/11/2023 1133   PLT 664 (H) 10/11/2023 1133   MCV 88.4 10/11/2023 1133   MCH 29.7 10/11/2023 1133   MCHC 33.6 10/11/2023 1133   RDW 15.4 10/11/2023 1133   LYMPHSABS  0.7 10/11/2023 1133   MONOABS 0.9 10/11/2023 1133   EOSABS 0.0 10/11/2023 1133   BASOSABS 0.0 10/11/2023 1133    BMET    Component Value Date/Time   NA 134 (L)  10/11/2023 1133   K 4.1 10/11/2023 1133   CL 93 (L) 10/11/2023 1133   CO2 25 10/11/2023 1133   GLUCOSE 136 (H) 10/11/2023 1133   BUN 11 10/11/2023 1133   CREATININE 1.02 10/11/2023 1133   CALCIUM 8.9 10/11/2023 1133   GFRNONAA >60 10/11/2023 1133   GFRAA >60 12/04/2019 0523    COAGS: Lab Results  Component Value Date   INR 1.1 10/11/2023   INR 1.0 10/03/2023   INR 1.1 09/27/2023     Non-Invasive Vascular Imaging:   CTA abdomen/pelvis with runoff to lower extremities ordered.  Statin:  Yes.   Beta Blocker:  No. Aspirin:  Yes.   ACEI:  No. ARB:  No. CCB use:  No Other antiplatelets/anticoagulants:  Yes.   Plavix 75 mg daily and Eliquis 5 mg twice daily.   ASSESSMENT/PLAN: This is a 62 y.o. male who returns to [facility] emergency department for right lower extremity ischemia from his right proximal thigh to his foot.  Patient underwent right lower extremity angiogram on 09/28/2023 that was unsuccessful.  Patient returns today in extreme pain with right lower extremity ischemia, elevated lactic acid and white blood cell count.  Plan Vascular surgery plans on taking the patient to the operating room tomorrow for right lower extremity above-the-knee amputation.  I discussed in detail with the patient the procedure, benefits, risk, and complications.  He verbalizes understanding wishes to proceed.  I answered all the patient's questions.  Patient will remain n.p.o. after midnight tonight for surgery tomorrow.   -I discussed the plan in detail with Dr. Festus Barren MD and he is in agreement with the plan.   [name] [facility] 10/11/2023 3:35 PM

## 2023-10-11 NOTE — H&P (Signed)
History and Physical    Patient: Brendan Williams [id number] DOB: Feb 26, 1961 DOA: 10/11/2023 DOS: the patient was seen and examined on 10/11/2023 PCP: Gildardo Pounds, PA  Patient coming from: Home - lives with wife, stepdaughter, grandchild; NOK:  Wife, Sonya Gim, [phone]   Chief Complaint: Foot infection  HPI: Brendan Williams is a 62 y.o. male with medical history significant of PAD s/p multiple angiograms and R TMA presenting with foot pain and erythema after recent treatment for cellulitis with Bactrim.  He started with pinkie toe infection with PAD 3 years ago.  He had the toe taken off and eventually had TMA about 4 months ago.  It has never healed.  Vascular surgery has been planning for amputation.  He has been feeling bad for a while.  No fever, having sweats.  The foot hurts a lot and he is quite anxious.      ER Course:  Known PAD, here with R leg pain and redness.  Prior TMA. H/o recent cellulitis, given Bactrim.  Now septic.  Vascular has seen, recommends AKA.  CTA ordered, Heparin, antibiotics, amputation soon.     Review of Systems: As mentioned in the history of present illness. All other systems reviewed and are negative. Past Medical History:  Diagnosis Date   Anxiety    situational   Asthma    as a child   Atherosclerosis of native arteries of extremity with intermittent claudication (HCC)    Cellulitis and abscess of toe of right foot    Critical limb ischemia of right lower extremity (HCC)    Current smoker    Quit 04/16/23   Peripheral arterial disease (HCC)    Past Surgical History:  Procedure Laterality Date   AMPUTATION TOE Right 12/02/2019   Procedure: AMPUTATION RIGHT 5TH TOE;  Surgeon: Rosetta Posner, DPM;  Location: [facility];  Service: Podiatry;  Laterality: Right;   APPLICATION OF WOUND VAC Bilateral 11/28/2019   Procedure: APPLICATION OF WOUND VAC;  Surgeon: Annice Needy, MD;  Location: [facility];  Service: Vascular;  Laterality:  Bilateral;  Prevena    BACK SURGERY     lower ruptured disk   CENTRAL VENOUS CATHETER INSERTION  11/28/2019   Procedure: INSERTION CENTRAL LINE ADULT;  Surgeon: Annice Needy, MD;  Location: [facility];  Service: Vascular;;   ENDARTERECTOMY FEMORAL Bilateral 11/28/2019   Procedure: ENDARTERECTOMY FEMORAL;  Surgeon: Annice Needy, MD;  Location: [facility];  Service: Vascular;  Laterality: Bilateral;   ENDARTERECTOMY FEMORAL Right 05/23/2023   Procedure: ENDARTERECTOMY FEMORAL REDO;  Surgeon: Annice Needy, MD;  Location: [facility];  Service: Vascular;  Laterality: Right;   ENDOVASCULAR REPAIR/STENT GRAFT Right 05/23/2023   Procedure: ENDOVASCULAR REPAIR/STENT GRAFT;  Surgeon: Annice Needy, MD;  Location: [facility];  Service: Vascular;  Laterality: Right;  Right SFA Stents   LOWER EXTREMITY ANGIOGRAPHY Right 11/14/2019   Procedure: LOWER EXTREMITY ANGIOGRAPHY;  Surgeon: Annice Needy, MD;  Location: [facility];  Service: Cardiovascular;  Laterality: Right;   LOWER EXTREMITY ANGIOGRAPHY Right 12/19/2021   Procedure: Lower Extremity Angiography;  Surgeon: Annice Needy, MD;  Location: [facility];  Service: Cardiovascular;  Laterality: Right;   LOWER EXTREMITY ANGIOGRAPHY Right 01/30/2022   Procedure: Lower Extremity Angiography;  Surgeon: Annice Needy, MD;  Location: [facility];  Service: Cardiovascular;  Laterality: Right;   LOWER EXTREMITY ANGIOGRAPHY Right 05/21/2023   Procedure: Lower Extremity Angiography;  Surgeon: Annice Needy, MD;  Location: [facility];  Service: Cardiovascular;  Laterality: Right;   LOWER EXTREMITY ANGIOGRAPHY Right 09/28/2023   Procedure: Lower Extremity Angiography;  Surgeon: Annice Needy, MD;  Location: [facility];  Service: Cardiovascular;  Laterality: Right;   MECHANICAL THROMBECTOMY WITH AORTOGRAM AND INTERVENTION  05/23/2023   Procedure: MECHANICAL THROMBECTOMY right popliteal artery;  Surgeon: Annice Needy, MD;  Location:  [facility];  Service: Vascular;;   STOMACH SURGERY     exploratory   TONSILECTOMY, ADENOIDECTOMY, BILATERAL MYRINGOTOMY AND TUBES     TONSILLECTOMY     TRANSMETATARSAL AMPUTATION Right 06/21/2023   Procedure: TRANSMETATARSAL AMPUTATION;  Surgeon: Rosetta Posner, DPM;  Location: [facility];  Service: Orthopedics/Podiatry;  Laterality: Right;   Social History:  reports that he quit smoking about 5 months ago. His smoking use included cigarettes. He started smoking about 41 years ago. He has a 40.5 pack-year smoking history. He has never used smokeless tobacco. He reports current alcohol use of about 5.0 standard drinks of alcohol per week. He reports current drug use. Frequency: 7.00 times per week. Drug: Marijuana.  No Known Allergies  Family History  Problem Relation Age of Onset   Leukemia Mother    Heart disease Father        a. CABG   Heart disease Sister        a. MVR   Cancer Sister    Cancer Brother    CAD Brother        a. CABG    Prior to Admission medications   Medication Sig Start Date End Date Taking? Authorizing Provider  amoxicillin-clavulanate (AUGMENTIN) 875-125 MG tablet Take 1 tablet by mouth 2 (two) times daily. 06/21/23   Rosetta Posner, DPM  apixaban (ELIQUIS) 5 MG TABS tablet Take 1 tablet (5 mg total) by mouth 2 (two) times daily. 06/21/23 08/20/23  Rosetta Posner, DPM  aspirin EC 81 MG tablet Take 1 tablet (81 mg total) by mouth daily. 06/21/23   Rosetta Posner, DPM  atorvastatin (LIPITOR) 40 MG tablet Take 1 tablet (40 mg total) by mouth daily. 05/30/23 07/29/23  Lonia Blood, MD  clopidogrel (PLAVIX) 75 MG tablet Take 1 tablet (75 mg total) by mouth daily. 06/21/23   Rosetta Posner, DPM  dexlansoprazole (DEXILANT) 60 MG capsule Take 60 mg by mouth as needed.    [provider]  gabapentin (NEURONTIN) 300 MG capsule Take 1 capsule (300 mg total) by mouth 2 (two) times daily. 05/30/23 07/29/23  Lonia Blood, MD  leptospermum manuka honey (MEDIHONEY)  PSTE paste Apply 1 Application topically daily. 06/20/23   Georgiana Spinner, NP  leptospermum manuka honey (MEDIHONEY) PSTE paste Apply 1 Application topically daily. 07/18/23   Georgiana Spinner, NP  ondansetron (ZOFRAN) 4 MG tablet Take 1 tablet (4 mg total) by mouth every 8 (eight) hours as needed for nausea or vomiting. 06/21/23   Rosetta Posner, DPM  oxyCODONE-acetaminophen (PERCOCET) 7.5-325 MG tablet Take 1 tablet by mouth every 4 (four) hours as needed for severe pain (pain score 7-10). 10/04/23 10/03/24  Georgiana Spinner, NP    Physical Exam: Vitals:   10/11/23 1132  BP: 130/79  Pulse: (!) 124  Resp: 20  Temp: 98.4 F (36.9 C)  TempSrc: Oral   General:  Appears anxious, having pain Eyes:  EOMI, normal lids, iris ENT:  grossly normal hearing, lips & tongue, mmm; poor dentition Neck:  no LAD, masses or thyromegaly Cardiovascular:  RR with mild tachycardia, no m/r/g. No  LE edema.  Respiratory:   CTA bilaterally with no wheezes/rales/rhonchi.  Normal respiratory effort. Abdomen:  soft, NT, ND Skin:  RLE erythema diffusely with lower leg excoriations and superficial ulcerations; leg is mottled and foot is grossly infected-appearing with shallow ulcerations     Musculoskeletal:  RLE as above; LLE without apparent ulcerations and with reasonable circulation Psychiatric:  anxious mood and affect, speech fluent and appropriate, AOx3 Neurologic:  CN 2-12 grossly intact, moves all extremities in coordinated fashion   Radiological Exams on Admission: Independently reviewed - see discussion in A/P where applicable  No results found.  EKG: Independently reviewed.  NSR with rate 84; nonspecific ST changes with no evidence of acute ischemia   Labs on Admission: I have personally reviewed the available labs and imaging studies at the time of the admission.  Pertinent labs:    Glucose 136 Anion gap 16 Lactate 2.9 WBC 22.2 Hgb 12.5 - stable Platelets 664   Assessment and  Plan: Principal Problem:   Critical limb ischemia of right lower extremity (HCC) Active Problems:   Tobacco use disorder   PAD (peripheral artery disease) (HCC)   Sepsis due to cellulitis (HCC)   Hyperlipidemia   Neuropathy    Critical limb ischemia, PAD Patient with recent angiogram showing nonreconstructable disease He was recommended for AKA and declined admission He was treated with Bactrim but has persistent LE infection with mottling of the leg He returned today and now agrees to amputation Will admit to telemetry Vascular surgery is consulting Heparin drip for critical limb ischemia AKA planned for tomorrow CTA runoff study for today to ensure that the wound will be able to heal Hold ASA, Plavix, Eliquis while on Heparin  Sepsis due to LE cellulitis/wound infection SIRS criteria in this patient includes: Leukocytosis, tachycardia  Patient has evidence of acute organ failure with elevated lactate >2 that is not easily explained by another condition. While awaiting blood cultures, this appears to be a preseptic condition. Sepsis protocol initiated Suspected source is LE wound infection Blood cultures pending Will trend lactate to ensure improvement Patient is NPO after midnight for procedure tomorrow LE wound order set utilized including labs (CRP, ESR, A1c, prealbumin, HIV, and blood cultures) and consults (TOC team; and nutrition)   Tobacco dependence Encourage cessation - he reports that he has "quit", last cigarette was about 3 days ago Patch declined by patient  Neuropathy Continue gabapentin  HLD Continue atorvastatin        Advance Care Planning:   Code Status: Full Code - Code status was discussed with the patient and/or family at the time of admission.  The patient would want to receive full resuscitative measures at this time.   Consults: Vascular surgery; TOC team; and nutrition  DVT Prophylaxis: Heparin drip  Family Communication: None present;  I spoke with his wife by telephone  Severity of Illness: The appropriate patient status for this patient is INPATIENT. Inpatient status is judged to be reasonable and necessary in order to provide the required intensity of service to ensure the patient's safety. The patient's presenting symptoms, physical exam findings, and initial radiographic and laboratory data in the context of their chronic comorbidities is felt to place them at high risk for further clinical deterioration. Furthermore, it is not anticipated that the patient will be medically stable for discharge from the hospital within 2 midnights of admission.   * I certify that at the point of admission it is my clinical judgment that the patient will require inpatient  hospital care spanning beyond 2 midnights from the point of admission due to high intensity of service, high risk for further deterioration and high frequency of surveillance required.*  Author: Jonah Blue, MD 10/11/2023 3:47 PM  For on call review [email or web address].

## 2023-10-11 NOTE — Progress Notes (Signed)
Pharmacy Antibiotic Note  Brendan Williams is a 62 y.o. male admitted on 10/11/2023 with  lower extremity wound infection .  Patient received one-time dose of Cefepime in ED. Ceftriaxone and Flagyl ordered per MD. Pharmacy has been consulted for Vancomycin dosing.  Plan: Vancomycin 1750 mg IV x 1, followed by 1000 mg IV Q 12 hrs. Goal AUC 400-550. Expected AUC: 473.2 SCr used: 1.02  Ceftriaxone 2 g IV q24h x 7 days  Flagyl 500 mg IV q12h x 7 days  Monitor renal function, cultures, clinical status Follow up LOT and de-escalate as able Obtain Vancomycin levels as clinically indicated   Height: 6\' 1"  (185.4 cm) Weight: 79.4 kg (175 lb 0.7 oz) IBW/kg (Calculated) : 79.9  Temp (24hrs), Avg:98.4 F (36.9 C), Min:98.4 F (36.9 C), Max:98.4 F (36.9 C)  Recent Labs  Lab 10/11/23 1133  WBC 22.2*  CREATININE 1.02  LATICACIDVEN 2.9*    Estimated Creatinine Clearance: 83.2 mL/min (by C-G formula based on SCr of 1.02 mg/dL).    No Known Allergies  Antimicrobials this admission: Cefepime 12/26 x1 Ceftriaxone 12/26 >> Flagyl 12/26 >> Vancomycin 12/26 >>   Microbiology results: 12/26 BCx: in process   Thank you for allowing pharmacy to be a part of this patient's care.  Jerrilyn Cairo, PharmD 10/11/2023 4:25 PM

## 2023-10-11 NOTE — Progress Notes (Signed)
CODE SEPSIS - PHARMACY COMMUNICATION  **Broad Spectrum Antibiotics should be administered within 1 hour of Sepsis diagnosis**  Time Code Sepsis Called/Page Received: 1405  Antibiotics Ordered: Cefepime, metronidazole, vancomycin  Time of 1st antibiotic administration: 1446  Additional action taken by pharmacy: Messaged nurse  If necessary, Name of Provider/Nurse Contacted: Loyce Dys, Paramedic    Merryl Hacker ,PharmD Clinical Pharmacist  10/11/2023  2:06 PM

## 2023-10-11 NOTE — Progress Notes (Signed)
ED Pharmacy Antibiotic Sign Off An antibiotic consult was received from an ED provider for vancomycin and cefepime per pharmacy dosing for sepsis. A chart review was completed to assess appropriateness.   The following one time order(s) were placed:  Cefepime 2 g IV x 1 Vancomycin 1 g IV x 1  Further antibiotic and/or antibiotic pharmacy consults should be ordered by the admitting provider if indicated.   Thank you for allowing pharmacy to be a part of this patient's care.   Merryl Hacker, [facility]  Clinical Pharmacist 10/11/23 2:09 PM

## 2023-10-11 NOTE — ED Triage Notes (Signed)
First RN: PT here via [facility] with right foot pain. Pt had an amputation on 8/24. Pt has been having complications x1 week. Pt states his area is red and painful.

## 2023-10-11 NOTE — Progress Notes (Signed)
Notified provider of need to order repeat lactic acid, given increase from 2.9->5.0.

## 2023-10-11 NOTE — Progress Notes (Signed)
       CROSS COVER NOTE  NAME: Brendan Williams MRN: [id number] DOB : [birth year]    Concern as stated by nurse / staff   Message receive from Thereon RN Lactic acid now 2.1, pressures soft 86/67     Pertinent findings on chart review:   Assessment and  Interventions   Assessment:    10/11/2023   10:15 PM 10/11/2023    6:29 PM 10/11/2023    4:30 PM  Vitals with BMI  Systolic 86 128 107  Diastolic 67 116 68  Pulse 74 123     Plan: Start NS 75/h as previously ordered       Donnie Mesa NP [facility] Cross Cover 7pm-7am - check amion for availability Pager [phone]

## 2023-10-12 ENCOUNTER — Inpatient Hospital Stay: Payer: Medicaid Other | Admitting: General Practice

## 2023-10-12 ENCOUNTER — Encounter: Payer: Self-pay | Admitting: Internal Medicine

## 2023-10-12 ENCOUNTER — Encounter
Admission: EM | Disposition: A | Discharge status: Home Health | Payer: Self-pay | Source: Home / Self Care | Attending: Internal Medicine

## 2023-10-12 ENCOUNTER — Other Ambulatory Visit: Payer: Self-pay

## 2023-10-12 DIAGNOSIS — L03119 Cellulitis of unspecified part of limb: Secondary | ICD-10-CM | POA: Diagnosis present

## 2023-10-12 DIAGNOSIS — T82856A Stenosis of peripheral vascular stent, initial encounter: Secondary | ICD-10-CM

## 2023-10-12 DIAGNOSIS — I70261 Atherosclerosis of native arteries of extremities with gangrene, right leg: Secondary | ICD-10-CM | POA: Diagnosis not present

## 2023-10-12 DIAGNOSIS — T82868A Thrombosis of vascular prosthetic devices, implants and grafts, initial encounter: Secondary | ICD-10-CM

## 2023-10-12 DIAGNOSIS — I743 Embolism and thrombosis of arteries of the lower extremities: Secondary | ICD-10-CM

## 2023-10-12 DIAGNOSIS — I70221 Atherosclerosis of native arteries of extremities with rest pain, right leg: Secondary | ICD-10-CM | POA: Diagnosis not present

## 2023-10-12 DIAGNOSIS — Z9889 Other specified postprocedural states: Secondary | ICD-10-CM

## 2023-10-12 HISTORY — PX: AMPUTATION: SHX166

## 2023-10-12 LAB — HEPARIN LEVEL (UNFRACTIONATED)
Heparin Unfractionated: 0.41 [IU]/mL (ref 0.30–0.70)
Heparin Unfractionated: 0.22 [IU]/mL — ABNORMAL LOW (ref 0.30–0.70)
Heparin Unfractionated: 0.22 [IU]/mL — ABNORMAL LOW (ref 0.30–0.70)

## 2023-10-12 LAB — MAGNESIUM: Magnesium: 1.9 mg/dL (ref 1.7–2.4)

## 2023-10-12 LAB — PREALBUMIN: Prealbumin: <5 mg/dL — ABNORMAL LOW (ref 18–38)

## 2023-10-12 LAB — HEPATIC FUNCTION PANEL
ALT: 10 U/L (ref 0–44)
AST: 17 U/L (ref 15–41)
Albumin: 2.3 g/dL — ABNORMAL LOW (ref 3.5–5.0)
Alkaline Phosphatase: 58 U/L (ref 38–126)
Bilirubin, Direct: <0.1 mg/dL (ref 0.0–0.2)
Total Bilirubin: 0.4 mg/dL (ref <1.2)
Total Protein: 6.1 g/dL — ABNORMAL LOW (ref 6.5–8.1)

## 2023-10-12 LAB — BASIC METABOLIC PANEL
Anion gap: 10 (ref 5–15)
BUN: 10 mg/dL (ref 8–23)
CO2: 27 mmol/L (ref 22–32)
Calcium: 8.1 mg/dL — ABNORMAL LOW (ref 8.9–10.3)
Chloride: 101 mmol/L (ref 98–111)
Creatinine, Ser: 0.76 mg/dL (ref 0.61–1.24)
GFR, Estimated: >60 mL/min (ref >60)
Glucose, Bld: 105 mg/dL — ABNORMAL HIGH (ref 70–99)
Potassium: 4.1 mmol/L (ref 3.5–5.1)
Sodium: 138 mmol/L (ref 135–145)

## 2023-10-12 LAB — LACTIC ACID, PLASMA: Lactic Acid, Venous: 1.2 mmol/L (ref 0.5–1.9)

## 2023-10-12 LAB — CBC
HCT: 30.3 % — ABNORMAL LOW (ref 39.0–52.0)
Hemoglobin: 9.9 g/dL — ABNORMAL LOW (ref 13.0–17.0)
MCH: 30 pg (ref 26.0–34.0)
MCHC: 32.7 g/dL (ref 30.0–36.0)
MCV: 91.8 fL (ref 80.0–100.0)
Platelets: 540 10*3/uL — ABNORMAL HIGH (ref 150–400)
RBC: 3.3 MIL/uL — ABNORMAL LOW (ref 4.22–5.81)
RDW: 15.5 % (ref 11.5–15.5)
WBC: 24.8 10*3/uL — ABNORMAL HIGH (ref 4.0–10.5)
nRBC: 0 % (ref 0.0–0.2)

## 2023-10-12 LAB — BLOOD GAS, VENOUS
Acid-Base Excess: 0.5 mmol/L (ref 0.0–2.0)
Bicarbonate: 25.4 mmol/L (ref 20.0–28.0)
O2 Saturation: 85.4 %
Patient temperature: 37
pCO2, Ven: 41 mm[Hg] — ABNORMAL LOW (ref 44–60)
pH, Ven: 7.4 (ref 7.25–7.43)
pO2, Ven: 55 mm[Hg] — ABNORMAL HIGH (ref 32–45)

## 2023-10-12 LAB — HEMOGLOBIN A1C
Hgb A1c MFr Bld: 6.2 % — ABNORMAL HIGH (ref 4.8–5.6)
Mean Plasma Glucose: 131 mg/dL

## 2023-10-12 LAB — APTT
aPTT: 89 s — ABNORMAL HIGH (ref 24–36)
aPTT: 54 s — ABNORMAL HIGH (ref 24–36)

## 2023-10-12 LAB — GLUCOSE, CAPILLARY: Glucose-Capillary: 122 mg/dL — ABNORMAL HIGH (ref 70–99)

## 2023-10-12 LAB — C-REACTIVE PROTEIN: CRP: 9.2 mg/dL — ABNORMAL HIGH (ref <1.0)

## 2023-10-12 SURGERY — AMPUTATION, ABOVE KNEE
Anesthesia: General | Site: Leg Upper | Laterality: Right

## 2023-10-12 MED ORDER — 0.9 % SODIUM CHLORIDE (POUR BTL) OPTIME
TOPICAL | Status: DC | PRN
  Administered 2023-10-12: 800 mL

## 2023-10-12 MED ORDER — METHYLPREDNISOLONE SODIUM SUCC 125 MG IJ SOLR: 125.0000 mg | Freq: Once | INTRAMUSCULAR | Status: DC | PRN

## 2023-10-12 MED ORDER — DIPHENHYDRAMINE HCL 50 MG/ML IJ SOLN: 50.0000 mg | Freq: Once | INTRAMUSCULAR | Status: DC | PRN

## 2023-10-12 MED ORDER — OXYCODONE HCL 5 MG PO TABS
5.0000 mg | ORAL_TABLET | Freq: Once | ORAL | Status: AC | PRN
  Administered 2023-10-12: 5 mg via ORAL

## 2023-10-12 MED ORDER — OXYCODONE HCL 5 MG PO TABS
ORAL_TABLET | ORAL | Status: AC
  Filled 2023-10-12: qty 1

## 2023-10-12 MED ORDER — ALBUMIN HUMAN 25 % IV SOLN
25.0000 g | Freq: Once | INTRAVENOUS | Status: DC
Start: 2023-10-12 — End: 2023-10-12

## 2023-10-12 MED ORDER — PROPOFOL 10 MG/ML IV BOLUS
INTRAVENOUS | Status: AC
  Filled 2023-10-12: qty 20

## 2023-10-12 MED ORDER — VITAMIN C 500 MG PO TABS
500.0000 mg | ORAL_TABLET | Freq: Two times a day (BID) | ORAL | Status: DC
  Administered 2023-10-12 – 2023-10-19 (×14): 500 mg via ORAL
  Filled 2023-10-12 (×14): qty 1

## 2023-10-12 MED ORDER — ACETAMINOPHEN 325 MG PO TABS
650.0000 mg | ORAL_TABLET | Freq: Four times a day (QID) | ORAL | Status: DC | PRN
  Administered 2023-10-17 – 2023-10-18 (×3): 650 mg via ORAL
  Filled 2023-10-12 (×3): qty 2

## 2023-10-12 MED ORDER — PROPOFOL 10 MG/ML IV BOLUS
INTRAVENOUS | Status: DC | PRN
  Administered 2023-10-12: 50 mg via INTRAVENOUS
  Administered 2023-10-12: 150 mg via INTRAVENOUS

## 2023-10-12 MED ORDER — FENTANYL CITRATE (PF) 100 MCG/2ML IJ SOLN
INTRAMUSCULAR | Status: AC
  Filled 2023-10-12: qty 2

## 2023-10-12 MED ORDER — PHENYLEPHRINE 80 MCG/ML (10ML) SYRINGE FOR IV PUSH (FOR BLOOD PRESSURE SUPPORT)
PREFILLED_SYRINGE | INTRAVENOUS | Status: AC
  Filled 2023-10-12: qty 10

## 2023-10-12 MED ORDER — SODIUM CHLORIDE 0.9 % IV SOLN: INTRAVENOUS | Status: DC

## 2023-10-12 MED ORDER — ONDANSETRON HCL 4 MG/2ML IJ SOLN
INTRAMUSCULAR | Status: DC | PRN
  Administered 2023-10-12: 4 mg via INTRAVENOUS

## 2023-10-12 MED ORDER — FAMOTIDINE 20 MG PO TABS: 40.0000 mg | ORAL_TABLET | Freq: Once | ORAL | Status: DC | PRN

## 2023-10-12 MED ORDER — FENTANYL CITRATE (PF) 100 MCG/2ML IJ SOLN
INTRAMUSCULAR | Status: DC | PRN
  Administered 2023-10-12 (×2): 25 ug via INTRAVENOUS

## 2023-10-12 MED ORDER — BUPIVACAINE HCL (PF) 0.5 % IJ SOLN
INTRAMUSCULAR | Status: AC
  Filled 2023-10-12: qty 30

## 2023-10-12 MED ORDER — CEFAZOLIN SODIUM-DEXTROSE 2-4 GM/100ML-% IV SOLN
2.0000 g | INTRAVENOUS | Status: AC
  Administered 2023-10-12: 2 g via INTRAVENOUS

## 2023-10-12 MED ORDER — SODIUM CHLORIDE 0.9 % IV BOLUS
500.0000 mL | Freq: Once | INTRAVENOUS | Status: AC
  Administered 2023-10-12: 500 mL via INTRAVENOUS

## 2023-10-12 MED ORDER — ENSURE ENLIVE PO LIQD
237.0000 mL | Freq: Three times a day (TID) | ORAL | Status: DC
  Administered 2023-10-12 – 2023-10-18 (×11): 237 mL via ORAL

## 2023-10-12 MED ORDER — BUPIVACAINE LIPOSOME 1.3 % IJ SUSP
INTRAMUSCULAR | Status: AC
  Filled 2023-10-12: qty 20

## 2023-10-12 MED ORDER — CLONAZEPAM 0.5 MG PO TABS
0.5000 mg | ORAL_TABLET | Freq: Three times a day (TID) | ORAL | Status: DC | PRN
  Administered 2023-10-12 – 2023-10-19 (×2): 0.5 mg via ORAL
  Filled 2023-10-12 (×2): qty 1

## 2023-10-12 MED ORDER — ADULT MULTIVITAMIN W/MINERALS CH
1.0000 | ORAL_TABLET | Freq: Every day | ORAL | Status: DC
Start: 2023-10-12 — End: 2023-10-19
  Administered 2023-10-12 – 2023-10-19 (×8): 1 via ORAL
  Filled 2023-10-12 (×8): qty 1

## 2023-10-12 MED ORDER — DEXAMETHASONE SODIUM PHOSPHATE 10 MG/ML IJ SOLN
INTRAMUSCULAR | Status: DC | PRN
  Administered 2023-10-12: 10 mg via INTRAVENOUS

## 2023-10-12 MED ORDER — BUPIVACAINE-EPINEPHRINE (PF) 0.25% -1:200000 IJ SOLN
INTRAMUSCULAR | Status: AC
  Filled 2023-10-12: qty 30

## 2023-10-12 MED ORDER — CEFAZOLIN SODIUM-DEXTROSE 2-4 GM/100ML-% IV SOLN
INTRAVENOUS | Status: AC
  Filled 2023-10-12: qty 100

## 2023-10-12 MED ORDER — OXYCODONE HCL 5 MG/5ML PO SOLN: 5.0000 mg | Freq: Once | ORAL | Status: AC | PRN

## 2023-10-12 MED ORDER — ACETAMINOPHEN 650 MG RE SUPP: 650.0000 mg | Freq: Four times a day (QID) | RECTAL | Status: DC | PRN

## 2023-10-12 MED ORDER — KETAMINE HCL 50 MG/5ML IJ SOSY
PREFILLED_SYRINGE | INTRAMUSCULAR | Status: AC
  Filled 2023-10-12: qty 5

## 2023-10-12 MED ORDER — LIDOCAINE HCL (CARDIAC) PF 100 MG/5ML IV SOSY
PREFILLED_SYRINGE | INTRAVENOUS | Status: DC | PRN
  Administered 2023-10-12: 80 mg via INTRAVENOUS

## 2023-10-12 MED ORDER — ZINC SULFATE 220 (50 ZN) MG PO CAPS
220.0000 mg | ORAL_CAPSULE | Freq: Every day | ORAL | Status: DC
  Administered 2023-10-12 – 2023-10-19 (×8): 220 mg via ORAL
  Filled 2023-10-12 (×8): qty 1

## 2023-10-12 MED ORDER — CHLORHEXIDINE GLUCONATE 0.12 % MT SOLN
OROMUCOSAL | Status: AC
  Filled 2023-10-12: qty 15

## 2023-10-12 MED ORDER — BUPIVACAINE-EPINEPHRINE (PF) 0.25% -1:200000 IJ SOLN
INTRAMUSCULAR | Status: DC | PRN
  Administered 2023-10-12: 50 mL via SURGICAL_CAVITY

## 2023-10-12 MED ORDER — MIDAZOLAM HCL 2 MG/2ML IJ SOLN
INTRAMUSCULAR | Status: DC | PRN
  Administered 2023-10-12: 2 mg via INTRAVENOUS

## 2023-10-12 MED ORDER — PHENYLEPHRINE 80 MCG/ML (10ML) SYRINGE FOR IV PUSH (FOR BLOOD PRESSURE SUPPORT)
PREFILLED_SYRINGE | INTRAVENOUS | Status: DC | PRN
  Administered 2023-10-12: 160 ug via INTRAVENOUS
  Administered 2023-10-12: 80 ug via INTRAVENOUS
  Administered 2023-10-12 (×4): 160 ug via INTRAVENOUS
  Administered 2023-10-12 (×2): 80 ug via INTRAVENOUS
  Administered 2023-10-12: 160 ug via INTRAVENOUS
  Administered 2023-10-12: 80 ug via INTRAVENOUS

## 2023-10-12 MED ORDER — MIDAZOLAM HCL 2 MG/2ML IJ SOLN
INTRAMUSCULAR | Status: AC
Start: 2023-10-12 — End: ?
  Filled 2023-10-12: qty 2

## 2023-10-12 MED ORDER — KETAMINE HCL 50 MG/5ML IJ SOSY
PREFILLED_SYRINGE | INTRAMUSCULAR | Status: DC | PRN
  Administered 2023-10-12: 20 mg via INTRAVENOUS

## 2023-10-12 MED ORDER — EPHEDRINE SULFATE-NACL 50-0.9 MG/10ML-% IV SOSY
PREFILLED_SYRINGE | INTRAVENOUS | Status: DC | PRN
  Administered 2023-10-12: 10 mg via INTRAVENOUS

## 2023-10-12 MED ORDER — VANCOMYCIN HCL 1250 MG/250ML IV SOLN
1250.0000 mg | Freq: Two times a day (BID) | INTRAVENOUS | Status: DC
  Administered 2023-10-12 – 2023-10-16 (×8): 1250 mg via INTRAVENOUS
  Filled 2023-10-12 (×8): qty 250

## 2023-10-12 MED ORDER — PHENYLEPHRINE HCL-NACL 20-0.9 MG/250ML-% IV SOLN
INTRAVENOUS | Status: DC | PRN
  Administered 2023-10-12: 50 ug/min via INTRAVENOUS

## 2023-10-12 MED ORDER — FENTANYL CITRATE (PF) 100 MCG/2ML IJ SOLN
25.0000 ug | INTRAMUSCULAR | Status: DC | PRN
  Administered 2023-10-12 (×2): 25 ug via INTRAVENOUS

## 2023-10-12 SURGICAL SUPPLY — 34 items
BLADE SAGITTAL WIDE XTHICK NO (BLADE) ×1 IMPLANT
BNDG COHESIVE 4X5 TAN STRL LF (GAUZE/BANDAGES/DRESSINGS) ×1 IMPLANT
BNDG ELASTIC 6X5.8 VLCR NS LF (GAUZE/BANDAGES/DRESSINGS) ×1 IMPLANT
BNDG GAUZE DERMACEA FLUFF 4 (GAUZE/BANDAGES/DRESSINGS) ×2 IMPLANT
BRUSH SCRUB EZ 4% CHG (MISCELLANEOUS) ×1 IMPLANT
CATH EMBOLECTOMY 5X80 (CATHETERS) IMPLANT
CHLORAPREP W/TINT 26 (MISCELLANEOUS) ×1 IMPLANT
DRAPE INCISE IOBAN 66X45 STRL (DRAPES) ×1 IMPLANT
DRAPE INCISE IOBAN 66X60 STRL (DRAPES) ×1 IMPLANT
ELECT CAUTERY BLADE 6.4 (BLADE) ×1 IMPLANT
ELECT REM PT RETURN 9FT ADLT (ELECTROSURGICAL) ×1 IMPLANT
ELECTRODE REM PT RTRN 9FT ADLT (ELECTROSURGICAL) ×1 IMPLANT
GAUZE XEROFORM 1X8 LF (GAUZE/BANDAGES/DRESSINGS) ×2 IMPLANT
GLOVE BIO SURGEON STRL SZ7 (GLOVE) ×2 IMPLANT
GOWN STRL REUS W/ TWL LRG LVL3 (GOWN DISPOSABLE) ×2 IMPLANT
GOWN STRL REUS W/TWL 2XL LVL3 (GOWN DISPOSABLE) ×1 IMPLANT
HANDLE YANKAUER SUCT BULB TIP (MISCELLANEOUS) ×1 IMPLANT
KIT TURNOVER KIT A (KITS) ×1 IMPLANT
LABEL OR SOLS (LABEL) ×1 IMPLANT
MANIFOLD NEPTUNE II (INSTRUMENTS) ×1 IMPLANT
MAT ABSORB FLUID 56X50 GRAY (MISCELLANEOUS) ×1 IMPLANT
NS IRRIG 1000ML POUR BTL (IV SOLUTION) ×1 IMPLANT
PACK EXTREMITY ARMC (MISCELLANEOUS) ×1 IMPLANT
PAD ABD DERMACEA PRESS 5X9 (GAUZE/BANDAGES/DRESSINGS) ×2 IMPLANT
PAD PREP OB/GYN DISP 24X41 (PERSONAL CARE ITEMS) ×1 IMPLANT
SPONGE T-LAP 18X18 ~~LOC~~+RFID (SPONGE) ×2 IMPLANT
STAPLER SKIN PROX 35W (STAPLE) ×1 IMPLANT
STOCKINETTE M/LG 89821 (MISCELLANEOUS) ×1 IMPLANT
SUT SILK 2 0 SH (SUTURE) ×2 IMPLANT
SUT SILK 2-0 18XBRD TIE 12 (SUTURE) ×1 IMPLANT
SUT VIC AB 0 CT1 36 (SUTURE) ×2 IMPLANT
SUT VIC AB 2-0 CT1 (SUTURE) ×2 IMPLANT
TRAP FLUID SMOKE EVACUATOR (MISCELLANEOUS) ×1 IMPLANT
WATER STERILE IRR 500ML POUR (IV SOLUTION) ×1 IMPLANT

## 2023-10-12 NOTE — Transfer of Care (Signed)
Immediate Anesthesia Transfer of Care Note  Patient: Brendan Williams  Procedure(s) Performed: AMPUTATION ABOVE KNEE (Right: Leg Upper)  Patient Location: PACU  Anesthesia Type:General  Level of Consciousness: awake, alert , and oriented  Airway & Oxygen Therapy: Patient Spontanous Breathing and Patient connected to nasal cannula oxygen  Post-op Assessment: Report given to RN and Post -op Vital signs reviewed and stable  Post vital signs: Reviewed and stable  Last Vitals:  Vitals Value Taken Time  BP 72/58 10/12/23 0916  Temp    Pulse 74 10/12/23 0919  Resp 18 10/12/23 0919  SpO2 93 % 10/12/23 0919  Vitals shown include unfiled device data.  Last Pain:  Vitals:   10/12/23 0909  TempSrc:   PainSc: Asleep         Complications: No notable events documented.

## 2023-10-12 NOTE — Op Note (Signed)
[facility]   OPERATIVE NOTE   PROCEDURE:  Right above-the-knee amputation Right SFA, common femoral, and iliac Fogarty embolectomy  PRE-OPERATIVE DIAGNOSIS: Right foot gangrene, irreversible ischemia right lower extremity  POST-OPERATIVE DIAGNOSIS: same as above  SURGEON:  Festus Barren, MD  ASSISTANT(S): Rolla Plate, NP  ANESTHESIA: general  ESTIMATED BLOOD LOSS: 100 cc  FINDING(S): none  SPECIMEN(S):  Right above-the-knee amputation  INDICATIONS:   Brendan Williams is a 62 y.o. male who presents with right lower leg gangrene.  He has profound and irreversible ischemia of the right lower extremity with essentially not unreconstructable vascular disease at this point.  He had surgical therapy several months ago and has had early occlusion of his femoral endarterectomy iliac stents, and no further option for revascularization appears possible.  The patient is scheduled for a right above-the-knee amputation.  I discussed in depth with the patient the risks, benefits, and alternatives to this procedure.  The patient is aware that the risk of this operation included but are not limited to:  bleeding, infection, myocardial infarction, stroke, death, failure to heal amputation wound, and possible need for more proximal amputation.  The patient is aware of the risks and agrees proceed forward with the procedure.  DESCRIPTION: After full informed written consent was obtained from the patient, the patient was taken to the operating room, and placed supine upon the operating table.  Prior to induction, the patient received IV antibiotics.  The patient was then prepped and draped in the standard fashion for a right above-the-knee amputation.  After obtaining adequate anesthesia, the patient was prepped and draped in the standard fashion for a above-the-knee amputation.  I marked out the anterior and posterior flaps for a fish-mouth type of amputation.  I made the incisions  for these flaps, and then dissected through the subcutaneous tissue, fascia, and muscles circumferentially.  I elevated  the periosteal tissue 4-5 cm more proximal than the anterior skin flap.  I then transected the femur with a power saw at this level.  Then I smoothed out the rough edges of the bone.  At this point, the specimen was passed off the field as the above-the-knee amputation.  At this point, I clamped all visibly bleeding arteries and veins using a combination of suture ligation with silk suture and electrocautery.   With his profound ischemia, and relatively early rethrombosis of felt it may be worthwhile to try to perform Fogarty embolectomy to improve inflow enough to optimize wound healing which is going to be marginal.  I then made a total of 4 passes with a #5 Fogarty embolectomy balloon proximally from the transected SFA with occluded stents.  There was dense calcific plaque and occlusion of the stents.  I was able to pass the Fogarty embolectomy catheter as far as about 50 cm superiorly and did return significant chronic appearing thrombus but there was still a paucity of flow through the SFA.  The visible portion of the stent was resected and removed as well as the thick calcific plaque and then the artery was oversewn with 2-0 silk suture ligature.  Bleeding continued to be controlled with electrocautery and suture ligature.  The stump was washed off with sterile normal saline and no further active bleeding was noted.  I reapproximated the anterior and posterior fascia  with interrupted stitches of 0 Vicryl.  This was completed along the entire length of anterior and posterior fascia until there were no more loose space in the fascial  line. The subcutaneous tissue was then approximated with 2-0 vicryl sutures. The skin was then  reapproximated with staples.  The stump was washed off and dried.  The incision was dressed with Xeroform and ABD pads, and  then fluffs were applied.  Kerlix was  wrapped around the leg and then gently an ACE wrap was applied.  A large Ioban was then placed over the ACE wrap to secure the dressing. The patient was then awakened and take to the recovery room in stable condition.   COMPLICATIONS: none  CONDITION: stable  Festus Barren  10/12/2023, 8:55 AM   This note was created with Dragon Medical transcription system. Any errors in dictation are purely unintentional.

## 2023-10-12 NOTE — ED Notes (Signed)
Confirmed pt admitted to [facility] with Brendan Williams

## 2023-10-12 NOTE — Anesthesia Postprocedure Evaluation (Signed)
Anesthesia Post Note  Patient: Brendan Williams  Procedure(s) Performed: AMPUTATION ABOVE KNEE (Right: Leg Upper)  Patient location during evaluation: PACU Anesthesia Type: General Level of consciousness: awake and alert Pain management: pain level controlled Vital Signs Assessment: post-procedure vital signs reviewed and stable Respiratory status: spontaneous breathing, nonlabored ventilation, respiratory function stable and patient connected to nasal cannula oxygen Cardiovascular status: blood pressure returned to baseline and stable Postop Assessment: no apparent nausea or vomiting Anesthetic complications: no  No notable events documented.   Last Vitals:  Vitals:   10/12/23 1030 10/12/23 1102  BP: (!) 78/58 (!) 79/68  Pulse: 62 65  Resp: 13 18  Temp: 36.6 C 36.7 C  SpO2: 98% 99%    Last Pain:  Vitals:   10/12/23 1116  TempSrc:   PainSc: 9                  [address]

## 2023-10-12 NOTE — Consult Note (Signed)
PHARMACY - ANTICOAGULATION CONSULT NOTE  Pharmacy Consult for IV Heparin Indication:  PAD on apixaban prior to admission / pending amputation  Patient Measurements: Height: 6\' 1"  (185.4 cm) Weight: 79.4 kg (175 lb 0.7 oz) IBW/kg (Calculated) : 79.9 Heparin Dosing Weight: 79.4 kg  Labs: Recent Labs    10/11/23 1133 10/11/23 1431 10/12/23 0411  HGB 12.5*  --  9.9*  HCT 37.2*  --  30.3*  PLT 664*  --  540*  APTT  --  47* 89*  LABPROT  --  14.6  --   INR  --  1.1  --   HEPARINUNFRC  --  0.65 0.41  CREATININE 1.02  --  0.76    Estimated Creatinine Clearance: 106.1 mL/min (by C-G formula based on SCr of 0.76 mg/dL).   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 Pharmacy consulted to initiate and manage heparin infusion for PAD while home apixaban is on hold pending surgery.  Baseline aPTT, INR and heparin level are pending. Baseline CBC notable for mild anemia and thrombocytosis.   Goal of Therapy:  Heparin level 0.3-0.7 units/ml aPTT 66 - 102 seconds Monitor platelets by anticoagulation protocol: Yes   1227 0411 aPTT 89, therapeutic x 1 / HL 0.41, correlating x 1 / Hgb 12.5 > 9.9  Plan:  --Continue heparin infusion at 1000 units/hr --Pt currently in OR this AM, will recheck aPTT/HL/CBC in 8 hours to confirm. Follow until correlation established with heparin level --Daily CBC per protocol while on IV  heparin  Otelia Sergeant, PharmD, [facility] 10/12/2023 7:17 AM

## 2023-10-12 NOTE — Progress Notes (Signed)
Pharmacy Antibiotic Note  Brendan Williams is a 62 y.o. male admitted on 10/11/2023 with  lower extremity wound infection .  Patient received one-time dose of Cefepime in ED. Ceftriaxone and Flagyl ordered per MD. Pharmacy has been consulted for Vancomycin dosing.  Plan: Pt is currently on vancomycin 1000 mg BID, with the improvement in Scr, will adjust vancomycin to 1250 mg q12H. Prediced AUC of 470. Goal AUC of 400-600. Plan to obtain vancomycin level after 4th or 5th dose. Plan for amputation 12/27, so possibly can stop abx 24 hours s/p procedure.   Continue ceftriaxone 2 g daily  Continue flagyl 500 BID IV.    Height: 6\' 1"  (185.4 cm) Weight: 79.4 kg (175 lb 0.7 oz) IBW/kg (Calculated) : 79.9  Temp (24hrs), Avg:98.3 F (36.8 C), Min:97.3 F (36.3 C), Max:99.3 F (37.4 C)  Recent Labs  Lab 10/11/23 1133 10/11/23 1636 10/11/23 2055 10/12/23 0135 10/12/23 0411  WBC 22.2*  --   --   --  24.8*  CREATININE 1.02  --   --   --  0.76  LATICACIDVEN 2.9* 5.0* 2.1* 1.2  --     Estimated Creatinine Clearance: 106.1 mL/min (by C-G formula based on SCr of 0.76 mg/dL).    No Known Allergies  Antimicrobials this admission: Cefepime 12/26 x1 Ceftriaxone 12/26 >> Flagyl 12/26 >> Vancomycin 12/26 >>   Microbiology results: 12/26 BCx: in process   Thank you for allowing pharmacy to be a part of this patient's care.  Paschal Dopp, PharmD, BCPS 10/12/2023 11:32 AM

## 2023-10-12 NOTE — Progress Notes (Signed)
PROGRESS NOTE    Brendan Williams  WUJ:[id number] DOB: 06-27-1961 DOA: 10/11/2023 PCP: Gildardo Pounds, PA    Brief Narrative:  62 y.o. male with medical history significant of PAD s/p multiple angiograms and R TMA presenting with foot pain and erythema after recent treatment for cellulitis with Bactrim.  He started with pinkie toe infection with PAD 3 years ago.  He had the toe taken off and eventually had TMA about 4 months ago.  It has never healed.  Vascular surgery has been planning for amputation.  He has been feeling bad for a while.  No fever, having sweats   Seen in consultation by vascular surgery.  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  Tolerated procedure well.  No immediate postoperative complications.   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 While awaiting blood cultures, this appears to be a preseptic condition. Sepsis protocol initiated Suspected source is LE wound infection Plan: Broad-spectrum IV antibiotics for now Monitor vitals and fever curve Okay for  diet  Tobacco dependence Encourage cessation - he reports that he has "quit", last cigarette was about 3 days ago Patch declined by patient   Neuropathy Continue gabapentin   HLD Continue atorvastatin      DVT prophylaxis: Heparin GTT Code Status: Full Family Communication: Spouse at bedside 12/27 Disposition Plan: Status is: Inpatient Remains inpatient appropriate because: POD #1 status post right AKA   Level of care: Stepdown  Consultants:  Vascular surgery  Procedures:  Right AKA 12/27  Antimicrobials: Vancomycin Ceftriaxone   Subjective: Seen and examined post procedurally.  Resting in bed.  Pain well-controlled.  Mild to moderate.  Anxiety endorsed.  Objective: Vitals:   10/12/23 1025 10/12/23 1030 10/12/23 1102 10/12/23 1139  BP:  (!) 78/58 (!) 79/68   Pulse: 62 62 65   Resp: (!) 9 13 18 14   Temp:  97.9 F (36.6 C) 98 F (36.7 C)   TempSrc:   Oral   SpO2: 99% 98% 99%   Weight:      Height:        Intake/Output Summary (Last 24 hours) at 10/12/2023 1324 Last data filed at 10/12/2023 1100 Gross per 24 hour  Intake 2770 ml  Output --  Net 2770 ml   Filed Weights   10/11/23 1600  Weight: 79.4 kg    Examination:  General exam: Mildly anxious Respiratory system: Clear to auscultation. Respiratory effort normal. Cardiovascular system: S1-S2, RRR, no murmurs, no pedal edema Gastrointestinal system: Soft, NT/ND, normal bowel sounds Central nervous system: Alert and oriented. No focal neurological deficits. Extremities: Right AKA.  Stump in surgical dressings.  CDI Skin: No  rashes, lesions or ulcers Psychiatry: Judgement and insight appear normal. Mood & affect appropriate.     Data Reviewed: I have personally reviewed following labs and imaging studies  CBC: Recent Labs  Lab 10/11/23 1133 10/12/23 0411  WBC 22.2* 24.8*  NEUTROABS 20.4*  --   HGB 12.5* 9.9*  HCT 37.2* 30.3*  MCV 88.4 91.8  PLT 664* 540*   Basic Metabolic  Panel: Recent Labs  Lab 10/11/23 1133 10/12/23 0411  NA 134* 138  K 4.1 4.1  CL 93* 101  CO2 25 27  GLUCOSE 136* 105*  BUN 11 10  CREATININE 1.02 0.76  CALCIUM 8.9 8.1*  MG  --  1.9   GFR: Estimated Creatinine Clearance: 106.1 mL/min (by C-G formula based on SCr of 0.76 mg/dL). Liver Function Tests: Recent Labs  Lab 10/11/23 1133 10/12/23 0411  AST 22 17  ALT 13 10  ALKPHOS 80 58  BILITOT 0.4 0.4  PROT 7.7 6.1*  ALBUMIN 3.0* 2.3*   No results for input(s): "LIPASE", "AMYLASE" in the last 168 hours. No results for input(s): "AMMONIA" in the last 168 hours. Coagulation Profile: Recent Labs  Lab 10/11/23 1431  INR 1.1   Cardiac Enzymes: No results for input(s): "CKTOTAL", "CKMB", "CKMBINDEX", "TROPONINI" in the last 168 hours. BNP (last 3 results) No results for input(s): "PROBNP" in the last 8760 hours. HbA1C: No results for input(s): "HGBA1C" in the last 72 hours. CBG: Recent Labs  Lab 10/11/23 1649 10/11/23 2217 10/12/23 1254  GLUCAP 71 117* 122*   Lipid Profile: No results for input(s): "CHOL", "HDL", "LDLCALC", "TRIG", "CHOLHDL", "LDLDIRECT" in the last 72 hours. Thyroid Function Tests: No results for input(s): "TSH", "T4TOTAL", "FREET4", "T3FREE", "THYROIDAB" in the last 72 hours. Anemia Panel: No results for input(s): "VITAMINB12", "FOLATE", "FERRITIN", "TIBC", "IRON", "RETICCTPCT" in the last 72 hours. Sepsis Labs: Recent Labs  Lab 10/11/23 1133 10/11/23 1636 10/11/23 2055 10/12/23 0135  PROCALCITON  --  1.59  --   --   LATICACIDVEN 2.9* 5.0* 2.1* 1.2    Recent Results (from the past 240 hours)  Blood Culture (routine x 2)     Status: None (Preliminary result)   Collection Time: 10/11/23  2:20 PM   Specimen: BLOOD  Result Value Ref Range Status   Specimen Description BLOOD LEFT ANTECUBITAL  Final   Special Requests   Final    BOTTLES DRAWN AEROBIC AND ANAEROBIC Blood Culture adequate volume   Culture  Setup Time PENDING  Incomplete    Culture   Final    NO GROWTH < 24 HOURS Performed at [facility], [address] [place], [place] [address]    Report Status PENDING  Incomplete  Blood Culture (routine x 2)     Status: None (Preliminary result)   Collection Time: 10/11/23  2:35 PM   Specimen: BLOOD  Result Value Ref Range Status   Specimen Description BLOOD BLOOD RIGHT FOREARM  Final   Special Requests   Final    BOTTLES DRAWN AEROBIC AND ANAEROBIC Blood Culture adequate volume   Culture  Setup Time PENDING  Incomplete   Culture   Final    NO GROWTH < 24 HOURS Performed at [facility], [address] [place], [place] [address]    Report Status PENDING  Incomplete         Radiology Studies: CT Angio Aortobifemoral W and/or Wo Contrast Result Date: 10/11/2023 CLINICAL DATA:  Right lower extremity cellulitis EXAM: CT ANGIOGRAPHY OF ABDOMINAL AORTA WITH ILIOFEMORAL RUNOFF  TECHNIQUE: Multidetector CT imaging of the abdomen, pelvis and lower extremities was performed using the standard protocol during bolus administration of intravenous contrast. Multiplanar CT image reconstructions and MIPs were obtained to evaluate the vascular anatomy. RADIATION DOSE REDUCTION: This exam was performed according to the departmental dose-optimization program which includes automated exposure control, adjustment of the mA and/or kV according to patient size and/or use of iterative reconstruction technique. CONTRAST:  OMNIPAQUE IOHEXOL 350 MG/ML SOLN COMPARISON:  None Available. FINDINGS: VASCULAR Aorta: Extensive atherosclerotic plaque. No evidence of aneurysm, dissection or significant stenosis. Celiac: Variant anatomy. The splenic artery and left gastric artery share a common trunk that arises directly from the aorta. Just below this, the common hepatic artery arises independently from the aorta. No significant plaque, stenosis or occlusion. No changes of fibromuscular dysplasia. SMA: Patent without evidence of  aneurysm, dissection, vasculitis or significant stenosis. Renals: Solitary renal arteries bilaterally. Scattered atherosclerotic plaque without significant stenosis or occlusion. No aneurysm, dissection or changes of fibromuscular dysplasia. IMA: Patent without evidence of aneurysm, dissection, vasculitis or significant stenosis. RIGHT Lower Extremity Inflow: Complete occlusion of right-sided kissing iliac stent. The origin of the internal iliac artery is occluded. The entire external iliac artery is occluded. Outflow: The common femoral artery is completely occluded. Profunda femoral branches are patent via collateral flow. Multiple stents are present in the superficial femoral artery extending through the P2 segment of the popliteal artery. The entire system is thrombosed. Runoff: No contrast opacification within the runoff arteries. LEFT Lower Extremity Inflow: Patent kissing iliac stent. Chronic occlusion of the origin of the internal iliac artery. The stent system extends through the external iliac artery and remains patent. Outflow: Patent common femoral artery with evidence of prior endarterectomy and patch angioplasty. The profunda femoral artery is widely patent. The superficial femoral artery occludes just beyond the origin and remains occluded throughout its length. The popliteal artery is patent. Runoff: Chronic occlusion of the anterior tibial artery. Two vessel runoff to the ankle from the posterior tibial and peroneal arteries. Veins: No focal venous abnormality. Review of the MIP images confirms the above findings. NON-VASCULAR Lower chest: Multifocal patchy airspace opacities throughout the visualized portions of the right middle and right lower lobe concerning for multi lobar pneumonia. The visualized lingula and left lower lobe are clear. Hepatobiliary: The heart is normal in size. No pericardial effusion. The distal esophagus is unremarkable. Pancreas: Unremarkable. No pancreatic ductal  dilatation or surrounding inflammatory changes. Spleen: Normal in size without focal abnormality. Adrenals/Urinary Tract: Adrenal glands are unremarkable. Kidneys are normal, without renal calculi, focal lesion, or hydronephrosis. Bladder is unremarkable. Stomach/Bowel: Significant colonic diverticulosis without evidence of active diverticulitis. No evidence of obstruction. Lymphatic: No suspicious lymphadenopathy. Reproductive: Prostate is unremarkable. Other: No abdominal wall hernia or abnormality. No abdominopelvic ascites. Musculoskeletal: Surgical changes of prior right transmetatarsal amputation. Extensive subcutaneous edema of the right lower extremity beginning in the upper calf and extending through the remainder of the limb. No acute osseous abnormality. IMPRESSION: VASCULAR 1. Severe right lower extremity peripheral arterial disease with apparent complete occlusion of the all arteries including the common iliac, external iliac, internal iliac, common femoral, superficial femoral, popliteal and infrageniculate arteries. The profunda femoral arteries are patent via collateral flow. All previously placed stents in the common iliac, superficial femoral and popliteal artery are occluded. 2. Widely patent stents on the left without significant disease in the iliac or femoropopliteal segments. There is chronic occlusion of the left anterior tibial artery. 3.  Aortic Atherosclerosis (ICD10-I70.0).  NON-VASCULAR 1. Probable multilobar pneumonia affecting the right middle and right lower lobes. 2. Significant colonic diverticulosis without evidence of active diverticulitis. 3. Surgical changes of prior right transmetatarsal amputation. 4. Extensive subcutaneous edema of the right lower extremity beginning in the upper calf and extending throughout the remainder of the limb consistent with the clinical history of cellulitis. Electronically Signed   By: Malachy Moan M.D.   On: 10/11/2023 16:26         Scheduled Meds:  atorvastatin  40 mg Oral Daily   docusate sodium  100 mg Oral BID   gabapentin  300 mg Oral BID   pantoprazole  40 mg Oral Daily   sodium chloride flush  3 mL Intravenous Q12H   Continuous Infusions:  sodium chloride 125 mL/hr at 10/12/23 1015   cefTRIAXone (ROCEPHIN)  IV Stopped (10/12/23 0135)   heparin 1,000 Units/hr (10/12/23 1102)   metronidazole     vancomycin       LOS: 1 day     Tresa Moore, MD [facility]   If 7PM-7AM, please contact night-coverage  10/12/2023, 1:24 PM

## 2023-10-12 NOTE — Anesthesia Preprocedure Evaluation (Signed)
Anesthesia Evaluation  Patient identified by MRN, date of birth, ID band Patient awake    Reviewed: Allergy & Precautions, H&P , NPO status , Patient's Chart, lab work & pertinent test results, reviewed documented beta blocker date and time   Airway Mallampati: II   Neck ROM: full    Dental  (+) Poor Dentition   Pulmonary neg shortness of breath, asthma , Patient abstained from smoking., former smoker   Pulmonary exam normal        Cardiovascular Exercise Tolerance: Poor + Peripheral Vascular Disease  Normal cardiovascular exam Rhythm:regular Rate:Normal     Neuro/Psych   Anxiety     negative neurological ROS  negative psych ROS   GI/Hepatic negative GI ROS, Neg liver ROS,,,  Endo/Other  negative endocrine ROS    Renal/GU      Musculoskeletal   Abdominal   Peds  Hematology negative hematology ROS (+)   Anesthesia Other Findings Past Medical History: No date: Anxiety     Comment:  situational No date: Asthma     Comment:  as a child No date: Atherosclerosis of native arteries of extremity with  intermittent claudication (HCC) No date: Cellulitis and abscess of toe of right foot No date: Critical limb ischemia of right lower extremity (HCC) No date: Current smoker     Comment:  Quit 04/16/23 No date: Peripheral arterial disease (HCC) Past Surgical History: 12/02/2019: AMPUTATION TOE; Right     Comment:  Procedure: AMPUTATION RIGHT 5TH TOE;  Surgeon: Rosetta Posner, DPM;  Location: [facility];  Service: Podiatry;                Laterality: Right; 11/28/2019: APPLICATION OF WOUND VAC; Bilateral     Comment:  Procedure: APPLICATION OF WOUND VAC;  Surgeon: Annice Needy, MD;  Location: [facility];  Service: Vascular;                Laterality: Bilateral;  Prevena  No date: BACK SURGERY     Comment:  lower ruptured disk 11/28/2019: CENTRAL VENOUS CATHETER INSERTION     Comment:  Procedure:  INSERTION CENTRAL LINE ADULT;  Surgeon: Annice Needy, MD;  Location: [facility];  Service: Vascular;; 11/28/2019: ENDARTERECTOMY FEMORAL; Bilateral     Comment:  Procedure: ENDARTERECTOMY FEMORAL;  Surgeon: Annice Needy, MD;  Location: [facility];  Service: Vascular;                Laterality: Bilateral; 05/23/2023: ENDARTERECTOMY FEMORAL; Right     Comment:  Procedure: ENDARTERECTOMY FEMORAL REDO;  Surgeon: Annice Needy, MD;  Location: [facility];  Service: Vascular;                Laterality: Right; 05/23/2023: ENDOVASCULAR REPAIR/STENT GRAFT; Right     Comment:  Procedure: ENDOVASCULAR REPAIR/STENT GRAFT;  Surgeon:               Annice Needy, MD;  Location: [facility];  Service:               Vascular;  Laterality: Right;  Right SFA Stents 11/14/2019: LOWER EXTREMITY  ANGIOGRAPHY; Right     Comment:  Procedure: LOWER EXTREMITY ANGIOGRAPHY;  Surgeon: Annice Needy, MD;  Location: [facility];  Service:               Cardiovascular;  Laterality: Right; 12/19/2021: LOWER EXTREMITY ANGIOGRAPHY; Right     Comment:  Procedure: Lower Extremity Angiography;  Surgeon: Annice Needy, MD;  Location: [facility];  Service:               Cardiovascular;  Laterality: Right; 01/30/2022: LOWER EXTREMITY ANGIOGRAPHY; Right     Comment:  Procedure: Lower Extremity Angiography;  Surgeon: Annice Needy, MD;  Location: [facility];  Service:               Cardiovascular;  Laterality: Right; 05/21/2023: LOWER EXTREMITY ANGIOGRAPHY; Right     Comment:  Procedure: Lower Extremity Angiography;  Surgeon: Annice Needy, MD;  Location: [facility];  Service:               Cardiovascular;  Laterality: Right; 05/23/2023: MECHANICAL THROMBECTOMY WITH AORTOGRAM AND INTERVENTION     Comment:  Procedure: MECHANICAL THROMBECTOMY right popliteal               artery;  Surgeon: Annice Needy, MD;   Location: [facility];               Service: Vascular;; No date: STOMACH SURGERY     Comment:  exploratory No date: TONSILECTOMY, ADENOIDECTOMY, BILATERAL MYRINGOTOMY AND TUBES No date: TONSILLECTOMY 06/21/2023: TRANSMETATARSAL AMPUTATION; Right     Comment:  Procedure: TRANSMETATARSAL AMPUTATION;  Surgeon: Rosetta Posner, DPM;  Location: [facility];  Service:               Orthopedics/Podiatry;  Laterality: Right; BMI    Body Mass Index: 23.09 kg/m     Reproductive/Obstetrics negative OB ROS                             Anesthesia Physical Anesthesia Plan  ASA: 3  Anesthesia Plan: General   Post-op Pain Management:    Induction: Intravenous  PONV Risk Score and Plan: 2 and Ondansetron, Dexamethasone and Midazolam  Airway Management Planned: LMA  Additional Equipment:   Intra-op Plan:   Post-operative Plan: Extubation in OR  Informed Consent: I have reviewed the patients History and Physical, chart, labs and discussed the procedure including the risks, benefits and alternatives for the proposed anesthesia with the patient or authorized representative who has indicated his/her understanding and acceptance.     Dental Advisory Given  Plan Discussed with: Anesthesiologist, CRNA and Surgeon  Anesthesia Plan Comments: (Patient consented for risks of anesthesia including but not limited to:  - adverse reactions to medications - damage to eyes, teeth, lips or other oral mucosa - nerve damage due to positioning  - sore throat or hoarseness - Damage to heart, brain, nerves, lungs, other parts of body or loss of life  Patient voiced understanding and assent.)  Anesthesia Quick Evaluation

## 2023-10-12 NOTE — Consult Note (Addendum)
PHARMACY - ANTICOAGULATION CONSULT NOTE  Pharmacy Consult for IV Heparin Indication:  PAD on apixaban prior to admission / pending amputation  Patient Measurements: Height: 6\' 1"  (185.4 cm) Weight: 79.4 kg (175 lb 0.7 oz) IBW/kg (Calculated) : 79.9 Heparin Dosing Weight: 79.4 kg  Labs: Recent Labs    10/11/23 1133 10/11/23 1431 10/12/23 0411  HGB 12.5*  --  9.9*  HCT 37.2*  --  30.3*  PLT 664*  --  540*  APTT  --  47* 89*  LABPROT  --  14.6  --   INR  --  1.1  --   HEPARINUNFRC  --  0.65 0.41  CREATININE 1.02  --  0.76    Estimated Creatinine Clearance: 106.1 mL/min (by C-G formula based on SCr of 0.76 mg/dL).   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 Pharmacy consulted to initiate and manage heparin infusion for PAD while home apixaban is on hold pending surgery.  Baseline aPTT, INR and heparin level are pending. Baseline CBC notable for mild anemia and thrombocytosis.   Goal of Therapy:  Heparin level 0.3-0.7 units/ml aPTT 66 - 102 seconds Monitor platelets by anticoagulation protocol: Yes   1227 0411 aPTT 89, therapeutic x 1 / HL 0.41, correlating x 1 / Hgb 12.5 > 9.9  Plan:  --Continue heparin infusion at 1000 units/hr --Pt currently in OR this AM, will recheck aPTT/CBC in 8 hours to confirm. Follow until correlation established with heparin level --Daily CBC per protocol while on IV  heparin  Otelia Sergeant, PharmD, [facility] 10/12/2023 7:13 AM

## 2023-10-12 NOTE — ED Notes (Signed)
Patient departing to OR at this time with OR staff.

## 2023-10-12 NOTE — Interval H&P Note (Signed)
History and Physical Interval Note:  10/12/2023 7:24 AM  Brendan Williams  has presented today for surgery, with the diagnosis of PAD.  The various methods of treatment have been discussed with the patient and family. After consideration of risks, benefits and other options for treatment, the patient has consented to  Procedure(s): AMPUTATION ABOVE KNEE (Right) as a surgical intervention.  The patient's history has been reviewed, patient examined, no change in status, stable for surgery.  I have reviewed the patient's chart and labs.  Questions were answered to the patient's satisfaction.     Festus Barren

## 2023-10-12 NOTE — Progress Notes (Signed)
       CROSS COVER NOTE  NAME: SINAI RIVENBURGH MRN: [id number] DOB : 1960/11/07    Concern as stated by nurse / staff   Follow up     Pertinent findings on chart review:   Assessment and  Interventions   Assessment:    10/12/2023    1:30 AM 10/12/2023   12:42 AM 10/12/2023   12:00 AM  Vitals with BMI  Systolic 109 80 83  Diastolic 65 65 69  Pulse 61  73   Lactic acid at 2055 2.9, improved from 5 Plan: Vbg order with repeat lactic when drawn Additional 1 L NS bolus now given Increased continuous IV fluid rate to 125 Admit bed changed to progressive        Donnie Mesa NP [facility] Cross Cover 7pm-7am - check amion for availability Pager [phone]

## 2023-10-12 NOTE — Anesthesia Procedure Notes (Signed)
Procedure Name: LMA Insertion Date/Time: 10/12/2023 7:40 AM  Performed by: Irving Burton, CRNAPre-anesthesia Checklist: Patient identified, Patient being monitored, Timeout performed, Emergency Drugs available and Suction available Patient Re-evaluated:Patient Re-evaluated prior to induction Oxygen Delivery Method: Circle system utilized Preoxygenation: Pre-oxygenation with 100% oxygen Induction Type: IV induction Ventilation: Mask ventilation without difficulty LMA: LMA inserted LMA Size: 4.0 Tube type: Oral Number of attempts: 1 Placement Confirmation: positive ETCO2 and breath sounds checked- equal and bilateral Tube secured with: Tape Dental Injury: Teeth and Oropharynx as per pre-operative assessment

## 2023-10-12 NOTE — Plan of Care (Signed)
  Problem: Clinical Measurements: Goal: Ability to maintain clinical measurements within normal limits will improve Outcome: Progressing Goal: Will remain free from infection Outcome: Progressing Goal: Diagnostic test results will improve Outcome: Progressing Goal: Respiratory complications will improve Outcome: Progressing Goal: Cardiovascular complication will be avoided Outcome: Progressing   Problem: Pain Management: Goal: General experience of comfort will improve Outcome: Progressing   Problem: Safety: Goal: Ability to remain free from injury will improve Outcome: Progressing

## 2023-10-12 NOTE — Progress Notes (Signed)
Initial Nutrition Assessment  DOCUMENTATION CODES:   Not applicable  INTERVENTION:   -Obtain new wt -Continue regular diet -Ensure Enlive po TID, each supplement provides 350 kcal and 20 grams of protein -MVI with minerals daily -500 mg vitamin C BID -220 mg zinc sulfate daily x 14 days  NUTRITION DIAGNOSIS:   Increased nutrient needs related to post-op healing as evidenced by estimated needs.  GOAL:   Patient will meet greater than or equal to 90% of their needs  MONITOR:   PO intake, Supplement acceptance  REASON FOR ASSESSMENT:   Consult Assessment of nutrition requirement/status, Wound healing  ASSESSMENT:   Pt with medical history significant of PAD s/p multiple angiograms and R TMA presented with foot pain and erythema after recent treatment for cellulitis with Bactrim.  He started with pinkie toe infection with PAD 3 years ago.  He had the toe taken off and eventually had TMA about 4 months ago.  It has never healed.  Pt admitted with critical limb ischemia, PAD, and sepsis secondary to LE cellulitis/ wound infection.   12/27- s/p Right above-the-knee amputation; Right SFA, common femoral, and iliac Fogarty embolectomy  Reviewed I/O's: +2.6 L x 24 hours  Spoke with pt and wife at bedside. Pt smiling and joking at time of visit. Pt reports he feels a little better after surgery, but hopes he does not have to work with PT today as he is feeling apprehensive about it. Pt reports he is still coming to terms about his amputation; RD provided emotional support. Pt identified that he has a good support system in his family and tries to carry a positive outlook ("my humor will get me through").   Pt shares that his appetite has been decreased over the past few weeks secondary to pain, but "tries to push through". PTA, pt was consuming 2 meals per day (Breakfast: cereal OR eggs and bacon; Dinner: sandwich or meat, starch, and vegetable). Pt consumed about 1/3 of his  spaghetti and a few bites of salad for lunch today.   Pt reports his UBW is around 175#. He estimates he has lost about 10# over the past 3 years secondary to "worrying about my leg". Unsure if most recent wt is a stated weight vs measured wt as pt with identical weight readings over past few weight recordings. RD will obtain new wt to better assess wt trends.   Discussed importance of good meal and supplement intake to promote healing. Pt amenable to supplements. RD also provided pt with coffee per request. He expressed gratitude for RD visit.   Medications reviewed and incluide colace, neurontin, and Protonix.   Labs reviewed: CBGS: 71-117 (inpatient orders for glycemic control are none).    NUTRITION - FOCUSED PHYSICAL EXAM:  Flowsheet Row Most Recent Value  Orbital Region No depletion  Upper Arm Region No depletion  Thoracic and Lumbar Region No depletion  Buccal Region No depletion  Temple Region No depletion  Clavicle Bone Region No depletion  Clavicle and Acromion Bone Region No depletion  Scapular Bone Region No depletion  Dorsal Hand No depletion  Patellar Region No depletion  Anterior Thigh Region No depletion  Posterior Calf Region No depletion  Edema (RD Assessment) Mild  Hair Reviewed  Eyes Reviewed  Mouth Reviewed  Skin Reviewed  Nails Reviewed       Diet Order:   Diet Order             Diet regular Room service appropriate? Yes; Fluid consistency:  Thin  Diet effective now                   EDUCATION NEEDS:   Education needs have been addressed  Skin:  Skin Assessment: Skin Integrity Issues: Skin Integrity Issues:: Incisions Incisions: s/p rt AKA  Last BM:  Unknown  Height:   Ht Readings from Last 1 Encounters:  10/11/23 6\' 1"  (1.854 m)    Weight:   Wt Readings from Last 1 Encounters:  10/11/23 79.4 kg    Ideal Body Weight:  76.9 kg (adjusted for AKA)  BMI:  Body mass index is 23.09 kg/m.  Estimated Nutritional Needs:   Kcal:   2100-2300  Protein:  105-120 grams  Fluid:  > 2 L    Levada Schilling, RD, LDN, CDCES Registered Dietitian III Certified Diabetes Care and Education Specialist If unable to reach this RD, please use "RD Inpatient" group chat on secure chat between hours of 8am-4 pm daily

## 2023-10-12 NOTE — Consult Note (Signed)
PHARMACY - ANTICOAGULATION CONSULT NOTE  Pharmacy Consult for IV Heparin Indication:  PAD on apixaban prior to admission / pending amputation  Patient Measurements: Height: 6\' 1"  (185.4 cm) Weight: 79.4 kg (175 lb 0.7 oz) IBW/kg (Calculated) : 79.9 Heparin Dosing Weight: 79.4 kg  Labs: Recent Labs    10/11/23 1133 10/11/23 1431 10/12/23 0411 10/12/23 1230  HGB 12.5*  --  9.9*  --   HCT 37.2*  --  30.3*  --   PLT 664*  --  540*  --   APTT  --  47* 89*  --   LABPROT  --  14.6  --   --   INR  --  1.1  --   --   HEPARINUNFRC  --  0.65 0.41 0.22*  CREATININE 1.02  --  0.76  --     Estimated Creatinine Clearance: 106.1 mL/min (by C-G formula based on SCr of 0.76 mg/dL).   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 Pharmacy consulted to initiate and manage heparin infusion for PAD while home apixaban is on hold pending surgery. Hgb drop  from 12 to 9. Continue to monitor.    Goal of Therapy:  Heparin level 0.3-0.7 units/ml aPTT 66 - 102 seconds Monitor platelets by anticoagulation protocol: Yes  Plan:  Heparin was stopped from PACU until the pt got to the floor. Restarted at 11:02. No rate change. Recheck heparin in 6 hours. CBC daily while on heparin.   Paschal Dopp, PharmD, BCPS 10/12/2023 1:52 PM

## 2023-10-12 NOTE — Consult Note (Signed)
PHARMACY - ANTICOAGULATION CONSULT NOTE  Pharmacy Consult for IV Heparin Indication:  PAD on apixaban prior to admission / pending amputation  Patient Measurements: Height: 6\' 1"  (185.4 cm) Weight: 79.4 kg (175 lb 0.7 oz) IBW/kg (Calculated) : 79.9 Heparin Dosing Weight: 79.4 kg  Labs: Recent Labs    10/11/23 1133 10/11/23 1431 10/11/23 1431 10/12/23 0411 10/12/23 1230 10/12/23 1628  HGB 12.5*  --   --  9.9*  --   --   HCT 37.2*  --   --  30.3*  --   --   PLT 664*  --   --  540*  --   --   APTT  --  47*  --  89*  --   --   LABPROT  --  14.6  --   --   --   --   INR  --  1.1  --   --   --   --   HEPARINUNFRC  --  0.65   < > 0.41 0.22* 0.22*  CREATININE 1.02  --   --  0.76  --   --    < > = values in this interval not displayed.    Estimated Creatinine Clearance: 106.1 mL/min (by C-G formula based on SCr of 0.76 mg/dL).   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 Pharmacy consulted to initiate and manage heparin infusion for PAD while home apixaban is on hold pending surgery. Hgb drop  from 12 to 9. Continue to monitor.   1227 0411 HL 0.41, aPTT 89 HL/aPTT levels therapeutic and correlating x1 Heparin was stopped from PACU until the pt got to the floor. Restarted at 11:02. No rate change.  1227 1628 HL 0.22 subtherapeutic  Goal of Therapy:  Heparin level 0.3-0.7 units/ml aPTT 66 - 102 seconds Monitor  platelets by anticoagulation protocol: Yes  Plan:  Increase heparin infusion to 1150 units/hour Check 6 hour aPTT Monitor heparin and aPTT levels until correlating, then monitor with heparin levels only Monitor CBC and signs/symptoms of bleeding  Thank you for involving pharmacy in this patient's care.   Rockwell Alexandria, PharmD Clinical Pharmacist 10/12/2023 5:21 PM

## 2023-10-13 ENCOUNTER — Encounter: Payer: Self-pay | Admitting: Vascular Surgery

## 2023-10-13 DIAGNOSIS — I70221 Atherosclerosis of native arteries of extremities with rest pain, right leg: Secondary | ICD-10-CM | POA: Diagnosis not present

## 2023-10-13 LAB — HEPARIN LEVEL (UNFRACTIONATED)
Heparin Unfractionated: 0.2 [IU]/mL — ABNORMAL LOW (ref 0.30–0.70)
Heparin Unfractionated: 0.23 [IU]/mL — ABNORMAL LOW (ref 0.30–0.70)

## 2023-10-13 LAB — CBC
HCT: 25 % — ABNORMAL LOW (ref 39.0–52.0)
Hemoglobin: 8.3 g/dL — ABNORMAL LOW (ref 13.0–17.0)
MCH: 30 pg (ref 26.0–34.0)
MCHC: 33.2 g/dL (ref 30.0–36.0)
MCV: 90.3 fL (ref 80.0–100.0)
Platelets: 478 10*3/uL — ABNORMAL HIGH (ref 150–400)
RBC: 2.77 MIL/uL — ABNORMAL LOW (ref 4.22–5.81)
RDW: 15.5 % (ref 11.5–15.5)
WBC: 15.8 10*3/uL — ABNORMAL HIGH (ref 4.0–10.5)
nRBC: 0 % (ref 0.0–0.2)

## 2023-10-13 LAB — APTT
aPTT: 59 s — ABNORMAL HIGH (ref 24–36)
aPTT: 76 s — ABNORMAL HIGH (ref 24–36)

## 2023-10-13 LAB — HEMOGLOBIN A1C
Hgb A1c MFr Bld: 6.3 % — ABNORMAL HIGH (ref 4.8–5.6)
Mean Plasma Glucose: 134 mg/dL

## 2023-10-13 MED ORDER — HEPARIN BOLUS VIA INFUSION
2400.0000 [IU] | Freq: Once | INTRAVENOUS | Status: AC
Start: 2023-10-13 — End: 2023-10-13
  Administered 2023-10-13: 2400 [IU] via INTRAVENOUS
  Filled 2023-10-13: qty 2400

## 2023-10-13 MED ORDER — HEPARIN BOLUS VIA INFUSION
1200.0000 [IU] | Freq: Once | INTRAVENOUS | Status: AC
  Administered 2023-10-13: 1200 [IU] via INTRAVENOUS
  Filled 2023-10-13: qty 1200

## 2023-10-13 MED ORDER — SODIUM CHLORIDE 0.9 % IV BOLUS
500.0000 mL | Freq: Once | INTRAVENOUS | Status: AC
  Administered 2023-10-13: 500 mL via INTRAVENOUS

## 2023-10-13 NOTE — Consult Note (Signed)
PHARMACY - ANTICOAGULATION CONSULT NOTE  Pharmacy Consult for IV Heparin Indication:  PAD on apixaban prior to admission / pending amputation  Patient Measurements: Height: 6\' 1"  (185.4 cm) Weight: 79.4 kg (175 lb 0.7 oz) IBW/kg (Calculated) : 79.9 Heparin Dosing Weight: 79.4 kg  Labs: Recent Labs    10/11/23 1133 10/11/23 1431 10/11/23 1431 10/12/23 0411 10/12/23 1230 10/12/23 1628 10/12/23 2319  HGB 12.5*  --   --  9.9*  --   --   --   HCT 37.2*  --   --  30.3*  --   --   --   PLT 664*  --   --  540*  --   --   --   APTT  --  47*  --  89*  --   --  54*  LABPROT  --  14.6  --   --   --   --   --   INR  --  1.1  --   --   --   --   --   HEPARINUNFRC  --  0.65   < > 0.41 0.22* 0.22*  --   CREATININE 1.02  --   --  0.76  --   --   --    < > = values in this interval not displayed.    Estimated Creatinine Clearance: 106.1 mL/min (by C-G formula based on SCr of 0.76 mg/dL).   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 Pharmacy consulted to initiate and manage heparin infusion for PAD while home apixaban is on hold pending surgery. Hgb drop  from 12 to 9. Continue to monitor.   1227 0411 HL 0.41, aPTT 89 HL/aPTT levels therapeutic and correlating x1 Heparin was stopped from PACU until the pt got to the floor. Restarted at 11:02. No rate change.  1227 1628 HL 0.22 subtherapeutic 12/27 2319 aPTT 54,  subtherapeutic  Goal of Therapy:  Heparin level 0.3-0.7 units/ml aPTT 66 - 102 seconds Monitor platelets by anticoagulation protocol: Yes  Plan:  Bolus 2400 units x 1 Increase heparin infusion to 1400 units/hour Recheck aPTT & HL in 6 hours after rate change Monitor heparin and aPTT levels until correlating, then monitor with heparin levels only Monitor CBC and signs/symptoms of bleeding  Thank you for involving pharmacy in this patient's care.   Otelia Sergeant, PharmD, [facility] 10/13/2023 12:53 AM

## 2023-10-13 NOTE — Consult Note (Signed)
PHARMACY - ANTICOAGULATION CONSULT NOTE  Pharmacy Consult for IV Heparin Indication:  PAD on apixaban prior to admission / pending amputation  Patient Measurements: Height: 6\' 1"  (185.4 cm) Weight: 77.2 kg (170 lb 3.1 oz) IBW/kg (Calculated) : 79.9 Heparin Dosing Weight: 79.4 kg  Labs: Recent Labs    10/11/23 1133 10/11/23 1431 10/11/23 1431 10/12/23 0411 10/12/23 1230 10/12/23 1628 10/12/23 2319 10/13/23 0548 10/13/23 0747 10/13/23 1544 10/13/23 2306  HGB 12.5*  --   --  9.9*  --   --   --  8.3*  --   --   --   HCT 37.2*  --   --  30.3*  --   --   --  25.0*  --   --   --   PLT 664*  --   --  540*  --   --   --  478*  --   --   --   APTT  --  47*   < > 89*  --   --  54*  --  59*  --  76*  LABPROT  --  14.6  --   --   --   --   --   --   --   --   --   INR  --  1.1  --   --   --   --   --   --   --   --   --   HEPARINUNFRC  --  0.65   < > 0.41   < > 0.22*  --   --  0.20* 0.23*  --   CREATININE 1.02  --   --  0.76  --   --   --   --   --   --   --    < > = values in this interval not displayed.    Estimated Creatinine Clearance: 103.2 mL/min (by C-G formula based on SCr of 0.76 mg/dL).   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 Pharmacy consulted to initiate and manage heparin infusion for PAD while home apixaban is on hold pending surgery. Hgb drop  from 12 to 9. Continue to monitor.   1227 0411 HL 0.41, aPTT  89 HL/aPTT levels therapeutic and correlating x1 Heparin was stopped from PACU until the pt got to the floor. Restarted at 11:02. No rate change.  1227 1628 HL 0.22 subtherapeutic 12/27 2319 aPTT 54, subtherapeutic 12/28 0747 aPTT 59/ HL 0.20 subtherapeutic, correlating  12/28 1544 HL 0.23 subtherapeutic 12/28 2306 aPTT 76, therapeutic x 1   Goal of Therapy:  Heparin level 0.3-0.7 units/ml aPTT 66 - 102 seconds Monitor platelets by anticoagulation protocol: Yes  Plan:  Bolus 1200 units x 1 Continue heparin infusion at 1700 units/hour Recheck aPTT & HL w/ AM labs to confirm  Monitor heparin and aPTT levels until correlating, then monitor with heparin levels only Monitor CBC and signs/symptoms of bleeding  Thank you for involving pharmacy in this patient's care.   Otelia Sergeant, PharmD, [facility] 10/13/2023 11:54 PM

## 2023-10-13 NOTE — Consult Note (Signed)
PHARMACY - ANTICOAGULATION CONSULT NOTE  Pharmacy Consult for IV Heparin Indication:  PAD on apixaban prior to admission / pending amputation  Patient Measurements: Height: 6\' 1"  (185.4 cm) Weight: 77.2 kg (170 lb 3.1 oz) IBW/kg (Calculated) : 79.9 Heparin Dosing Weight: 79.4 kg  Labs: Recent Labs    10/11/23 1133 10/11/23 1431 10/11/23 1431 10/12/23 0411 10/12/23 1230 10/12/23 1628 10/12/23 2319 10/13/23 0548 10/13/23 0747 10/13/23 1544  HGB 12.5*  --   --  9.9*  --   --   --  8.3*  --   --   HCT 37.2*  --   --  30.3*  --   --   --  25.0*  --   --   PLT 664*  --   --  540*  --   --   --  478*  --   --   APTT  --  47*   < > 89*  --   --  54*  --  59*  --   LABPROT  --  14.6  --   --   --   --   --   --   --   --   INR  --  1.1  --   --   --   --   --   --   --   --   HEPARINUNFRC  --  0.65   < > 0.41   < > 0.22*  --   --  0.20* 0.23*  CREATININE 1.02  --   --  0.76  --   --   --   --   --   --    < > = values in this interval not displayed.    Estimated Creatinine Clearance: 103.2 mL/min (by C-G formula based on SCr of 0.76 mg/dL).   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 Pharmacy consulted to initiate and manage heparin infusion for PAD while home apixaban is on hold pending surgery. Hgb drop  from 12 to 9. Continue to monitor.   1227 0411 HL 0.41, aPTT 89 HL/aPTT levels therapeutic and correlating  x1 Heparin was stopped from PACU until the pt got to the floor. Restarted at 11:02. No rate change.  1227 1628 HL 0.22 subtherapeutic 12/27 2319 aPTT 54, subtherapeutic 12/28 0747 aPTT 59/ HL 0.20 subtherapeutic, correlating  12/28 1544 HL 0.23 subtherapeutic  Goal of Therapy:  Heparin level 0.3-0.7 units/ml aPTT 66 - 102 seconds Monitor platelets by anticoagulation protocol: Yes  Plan:  Bolus 1200 units x 1 Increase heparin infusion to 1700 units/hour Recheck HL in 6 hours after rate change Monitor heparin and aPTT levels until correlating, then monitor with heparin levels only Monitor CBC and signs/symptoms of bleeding  Thank you for involving pharmacy in this patient's care.   Rockwell Alexandria,  PharmD Clinical Pharmacist 10/13/2023 4:41 PM

## 2023-10-13 NOTE — Plan of Care (Signed)
  Problem: Education: Goal: Ability to describe self-care measures that may prevent or decrease complications (Diabetes Survival Skills Education) will improve Outcome: Progressing   Problem: Coping: Goal: Ability to adjust to condition or change in health will improve Outcome: Progressing   Problem: Fluid Volume: Goal: Ability to maintain a balanced intake and output will improve Outcome: Progressing   Problem: Nutritional: Goal: Maintenance of adequate nutrition will improve Outcome: Progressing Goal: Progress toward achieving an optimal weight will improve Outcome: Progressing   Problem: Skin Integrity: Goal: Risk for impaired skin integrity will decrease Outcome: Progressing   Problem: Tissue Perfusion: Goal: Adequacy of tissue perfusion will improve Outcome: Progressing

## 2023-10-13 NOTE — Consult Note (Signed)
PHARMACY - ANTICOAGULATION CONSULT NOTE  Pharmacy Consult for IV Heparin Indication:  PAD on apixaban prior to admission / pending amputation  Patient Measurements: Height: 6\' 1"  (185.4 cm) Weight: 77.2 kg (170 lb 3.1 oz) IBW/kg (Calculated) : 79.9 Heparin Dosing Weight: 79.4 kg  Labs: Recent Labs    10/11/23 1133 10/11/23 1431 10/11/23 1431 10/12/23 0411 10/12/23 1230 10/12/23 1628 10/12/23 2319 10/13/23 0548 10/13/23 0747  HGB 12.5*  --   --  9.9*  --   --   --  8.3*  --   HCT 37.2*  --   --  30.3*  --   --   --  25.0*  --   PLT 664*  --   --  540*  --   --   --  478*  --   APTT  --  47*   < > 89*  --   --  54*  --  59*  LABPROT  --  14.6  --   --   --   --   --   --   --   INR  --  1.1  --   --   --   --   --   --   --   HEPARINUNFRC  --  0.65   < > 0.41 0.22* 0.22*  --   --  0.20*  CREATININE 1.02  --   --  0.76  --   --   --   --   --    < > = values in this interval not displayed.    Estimated Creatinine Clearance: 103.2 mL/min (by C-G formula based on SCr of 0.76 mg/dL).   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 Pharmacy consulted to initiate and manage heparin infusion for PAD while home apixaban is on hold pending surgery. Hgb drop  from 12 to 9. Continue to monitor.   1227 0411 HL 0.41, aPTT 89 HL/aPTT levels therapeutic and correlating x1 Heparin was stopped from PACU until the pt got to the  floor. Restarted at 11:02. No rate change.  1227 1628 HL 0.22 subtherapeutic 12/27 2319 aPTT 54, subtherapeutic 12/28 0747 aPTT 59/ HL 0.20 subtherapeutic, correlating   Goal of Therapy:  Heparin level 0.3-0.7 units/ml aPTT 66 - 102 seconds Monitor platelets by anticoagulation protocol: Yes  Plan:  Bolus 1200 units x 1 Increase heparin infusion to 1550 units/hour Recheck HL in 6 hours after rate change Monitor heparin and aPTT levels until correlating, then monitor with heparin levels only Monitor CBC and signs/symptoms of bleeding  Thank you for involving pharmacy in this patient's care.   Bari Mantis PharmD Clinical Pharmacist 10/13/2023

## 2023-10-13 NOTE — Progress Notes (Signed)
PROGRESS NOTE    Brendan Williams  ZOX:[id number] DOB: October 02, 1961 DOA: 10/11/2023 PCP: Gildardo Pounds, PA    Brief Narrative:  62 y.o. male with medical history significant of PAD s/p multiple angiograms and R TMA presenting with foot pain and erythema after recent treatment for cellulitis with Bactrim.  He started with pinkie toe infection with PAD 3 years ago.  He had the toe taken off and eventually had TMA about 4 months ago.  It has never healed.  Vascular surgery has been planning for amputation.  He has been feeling bad for a while.  No fever, having sweats   Seen in consultation by vascular surgery.  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  Tolerated procedure well.  No immediate postoperative complications.   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 While awaiting blood cultures, this appears to be a preseptic condition. Sepsis protocol initiated Suspected source is LE wound infection Plan: Broad-spectrum IV antibiotics for now Monitor vitals  and fever curve Diet as tolerated  Tobacco dependence Encourage cessation - he reports that he has "quit", last cigarette was about 3 days ago Patch declined by patient   Neuropathy Continue gabapentin   HLD Continue atorvastatin      DVT prophylaxis: Heparin GTT Code Status: Full Family Communication: Spouse at bedside 12/27 Disposition Plan: Status is: Inpatient Remains inpatient appropriate because: POD # 2 status post right AKA   Level of care: Stepdown  Consultants:  Vascular surgery  Procedures:  Right AKA 12/27  Antimicrobials: Vancomycin Ceftriaxone   Subjective: Seen and examined.  Resting in bed.  Anxiety improved.  Pain mild to moderate.  Objective: Vitals:   10/13/23 1100 10/13/23 1114 10/13/23 1129 10/13/23 1200  BP:  94/69    Pulse:  65    Resp: 18 16 14 15   Temp:  98 F (36.7 C)    TempSrc:  Oral    SpO2:  99%    Weight:      Height:        Intake/Output Summary (Last 24 hours) at 10/13/2023 1242 Last data filed at 10/13/2023 1204 Gross per 24 hour  Intake 4789.85 ml  Output 1675 ml  Net 3114.85 ml   Filed Weights   10/11/23 1600 10/13/23 0500  Weight: 79.4 kg 77.2 kg    Examination:  General exam: NAD Respiratory system: Lungs clear.  Normal work of breathing.  Room air Cardiovascular system: S1-S2, RRR, no murmurs, no pedal edema Gastrointestinal system: Soft, NT/ND, normal bowel sounds Central nervous system: Alert and oriented. No focal neurological deficits. Extremities: Right AKA.  Stump in surgical dressings.  CDI  Skin: No rashes, lesions or ulcers Psychiatry: Judgement and insight appear normal. Mood & affect appropriate.     Data Reviewed: I have personally reviewed following labs and imaging studies  CBC: Recent Labs  Lab 10/11/23 1133 10/12/23 0411 10/13/23 0548  WBC 22.2* 24.8* 15.8*  NEUTROABS 20.4*  --   --   HGB 12.5* 9.9* 8.3*  HCT 37.2* 30.3* 25.0*  MCV 88.4 91.8 90.3  PLT 664* 540* 478*    Basic Metabolic Panel: Recent Labs  Lab 10/11/23 1133 10/12/23 0411  NA 134* 138  K 4.1 4.1  CL 93* 101  CO2 25 27  GLUCOSE 136* 105*  BUN 11 10  CREATININE 1.02 0.76  CALCIUM 8.9 8.1*  MG  --  1.9   GFR: Estimated Creatinine Clearance: 103.2 mL/min (by C-G formula based on SCr of 0.76 mg/dL). Liver Function Tests: Recent Labs  Lab 10/11/23 1133 10/12/23 0411  AST 22 17  ALT 13 10  ALKPHOS 80 58  BILITOT 0.4 0.4  PROT 7.7 6.1*  ALBUMIN 3.0* 2.3*   No results for input(s): "LIPASE", "AMYLASE" in the last 168 hours. No results for input(s): "AMMONIA" in the last 168 hours. Coagulation Profile: Recent Labs  Lab 10/11/23 1431  INR 1.1   Cardiac Enzymes: No results for input(s): "CKTOTAL", "CKMB", "CKMBINDEX", "TROPONINI" in the last 168 hours. BNP (last 3 results) No results for input(s): "PROBNP" in the last 8760 hours. HbA1C: Recent Labs    10/11/23 1636 10/12/23 0411  HGBA1C 6.2* 6.3*   CBG: Recent Labs  Lab 10/11/23 1649 10/11/23 2217 10/12/23 1254  GLUCAP 71 117* 122*   Lipid Profile: No results for input(s): "CHOL", "HDL", "LDLCALC", "TRIG", "CHOLHDL", "LDLDIRECT" in the last 72 hours. Thyroid Function Tests: No results for input(s): "TSH", "T4TOTAL", "FREET4", "T3FREE", "THYROIDAB" in the last 72 hours. Anemia Panel: No results for input(s): "VITAMINB12", "FOLATE", "FERRITIN", "TIBC", "IRON", "RETICCTPCT" in the last 72 hours. Sepsis Labs: Recent Labs  Lab 10/11/23 1133 10/11/23 1636 10/11/23 2055 10/12/23 0135  PROCALCITON  --  1.59  --   --   LATICACIDVEN 2.9* 5.0* 2.1* 1.2    Recent Results (from the past 240 hours)  Blood Culture (routine x 2)     Status: None (Preliminary result)   Collection Time: 10/11/23  2:20 PM   Specimen: BLOOD  Result Value Ref Range Status   Specimen Description BLOOD LEFT ANTECUBITAL  Final   Special Requests   Final    BOTTLES DRAWN AEROBIC AND ANAEROBIC Blood Culture adequate volume   Culture   Setup Time PENDING  Incomplete   Culture   Final    NO GROWTH 2 DAYS Performed at [facility], [address], [place], [place] [address]    Report Status PENDING  Incomplete  Blood Culture (routine x 2)     Status: None (Preliminary result)   Collection Time: 10/11/23  2:35 PM   Specimen: BLOOD  Result Value Ref Range Status   Specimen Description BLOOD BLOOD RIGHT FOREARM  Final   Special Requests   Final    BOTTLES DRAWN AEROBIC AND ANAEROBIC Blood Culture adequate volume   Culture  Setup Time PENDING  Incomplete   Culture   Final    NO GROWTH 2 DAYS Performed at [facility], [address], [place], [place] [address]    Report Status PENDING  Incomplete         Radiology Studies: CT Angio Aortobifemoral W and/or Wo Contrast Result Date: 10/11/2023 CLINICAL  DATA:  Right lower extremity cellulitis EXAM: CT ANGIOGRAPHY OF ABDOMINAL AORTA WITH ILIOFEMORAL RUNOFF TECHNIQUE: Multidetector CT imaging of the abdomen, pelvis and lower extremities was performed using the standard protocol during bolus administration of intravenous contrast. Multiplanar CT image reconstructions and MIPs were obtained to evaluate the vascular anatomy. RADIATION DOSE REDUCTION: This exam was performed according to the departmental dose-optimization program which includes automated exposure control, adjustment of the mA and/or kV according to patient size and/or use of iterative reconstruction technique. CONTRAST:  OMNIPAQUE IOHEXOL 350 MG/ML SOLN COMPARISON:  None Available. FINDINGS: VASCULAR Aorta: Extensive atherosclerotic plaque. No evidence of aneurysm, dissection or significant stenosis. Celiac: Variant anatomy. The splenic artery and left gastric artery share a common trunk that arises directly from the aorta. Just below this, the common hepatic artery arises independently from the aorta. No significant plaque, stenosis or occlusion. No changes of fibromuscular dysplasia. SMA:  Patent without evidence of aneurysm, dissection, vasculitis or significant stenosis. Renals: Solitary renal arteries bilaterally. Scattered atherosclerotic plaque without significant stenosis or occlusion. No aneurysm, dissection or changes of fibromuscular dysplasia. IMA: Patent without evidence of aneurysm, dissection, vasculitis or significant stenosis. RIGHT Lower Extremity Inflow: Complete occlusion of right-sided kissing iliac stent. The origin of the internal iliac artery is occluded. The entire external iliac artery is occluded. Outflow: The common femoral artery is completely occluded. Profunda femoral branches are patent via collateral flow. Multiple stents are present in the superficial femoral artery extending through the P2 segment of the popliteal artery. The entire system is thrombosed. Runoff: No contrast opacification within the runoff arteries. LEFT Lower Extremity Inflow: Patent kissing iliac stent. Chronic occlusion of the origin of the internal iliac artery. The stent system extends through the external iliac artery and remains patent. Outflow: Patent common femoral artery with evidence of prior endarterectomy and patch angioplasty. The profunda femoral artery is widely patent. The superficial femoral artery occludes just beyond the origin and remains occluded throughout its length. The popliteal artery is patent. Runoff: Chronic occlusion of the anterior tibial artery. Two vessel runoff to the ankle from the posterior tibial and peroneal arteries. Veins: No focal venous abnormality. Review of the MIP images confirms the above findings. NON-VASCULAR Lower chest: Multifocal patchy airspace opacities throughout the visualized portions of the right middle and right lower lobe concerning for multi lobar pneumonia. The visualized lingula and left lower lobe are clear. Hepatobiliary: The heart is normal in size. No pericardial effusion. The distal esophagus is unremarkable. Pancreas: Unremarkable. No  pancreatic ductal dilatation or surrounding inflammatory changes. Spleen: Normal in size without focal abnormality. Adrenals/Urinary Tract: Adrenal glands are unremarkable. Kidneys are normal, without renal calculi, focal lesion, or hydronephrosis. Bladder is unremarkable. Stomach/Bowel: Significant colonic diverticulosis without evidence of active diverticulitis. No evidence of obstruction. Lymphatic: No suspicious lymphadenopathy. Reproductive: Prostate is unremarkable. Other: No abdominal wall hernia or abnormality. No abdominopelvic ascites. Musculoskeletal: Surgical changes of prior right transmetatarsal amputation. Extensive subcutaneous edema of the right lower extremity beginning in the upper calf and extending through the remainder of the limb. No acute osseous abnormality. IMPRESSION: VASCULAR 1. Severe right lower extremity peripheral arterial disease with apparent complete occlusion of the all arteries including the common iliac, external iliac, internal iliac, common femoral, superficial femoral, popliteal and infrageniculate arteries. The profunda femoral arteries are patent via collateral flow. All previously placed stents in the common iliac, superficial femoral and popliteal artery are occluded. 2. Widely patent stents on the left without significant disease in the iliac or femoropopliteal segments.  There is chronic occlusion of the left anterior tibial artery. 3.  Aortic Atherosclerosis (ICD10-I70.0). NON-VASCULAR 1. Probable multilobar pneumonia affecting the right middle and right lower lobes. 2. Significant colonic diverticulosis without evidence of active diverticulitis. 3. Surgical changes of prior right transmetatarsal amputation. 4. Extensive subcutaneous edema of the right lower extremity beginning in the upper calf and extending throughout the remainder of the limb consistent with the clinical history of cellulitis. Electronically Signed   By: Malachy Moan M.D.   On: 10/11/2023 16:26         Scheduled Meds:  vitamin C  500 mg Oral BID   atorvastatin  40 mg Oral Daily   docusate sodium  100 mg Oral BID   feeding supplement  237 mL Oral TID BM   gabapentin  300 mg Oral BID   multivitamin with minerals  1 tablet Oral Daily   pantoprazole  40 mg Oral Daily   sodium chloride flush  3 mL Intravenous Q12H   zinc sulfate (50mg  elemental zinc)  220 mg Oral Daily   Continuous Infusions:  sodium chloride 125 mL/hr at 10/13/23 1200   cefTRIAXone (ROCEPHIN)  IV Stopped (10/12/23 2235)   heparin 1,550 Units/hr (10/13/23 1200)   metronidazole Stopped (10/13/23 0800)   vancomycin Stopped (10/13/23 0806)     LOS: 2 days     Tresa Moore, MD [facility]   If 7PM-7AM, please contact night-coverage  10/13/2023, 12:42 PM

## 2023-10-13 NOTE — Plan of Care (Signed)
  Problem: Coping: Goal: Ability to adjust to condition or change in health will improve Outcome: Progressing   Problem: Fluid Volume: Goal: Ability to maintain a balanced intake and output will improve Outcome: Progressing   Problem: Health Behavior/Discharge Planning: Goal: Ability to manage health-related needs will improve Outcome: Progressing   Problem: Nutritional: Goal: Maintenance of adequate nutrition will improve Outcome: Progressing Goal: Progress toward achieving an optimal weight will improve Outcome: Progressing   Problem: Skin Integrity: Goal: Risk for impaired skin integrity will decrease Outcome: Progressing   Problem: Tissue Perfusion: Goal: Adequacy of tissue perfusion will improve Outcome: Progressing   Problem: Education: Goal: Knowledge of General Education information will improve Description: Including pain rating scale, medication(s)/side effects and non-pharmacologic comfort measures Outcome: Progressing   Problem: Health Behavior/Discharge Planning: Goal: Ability to manage health-related needs will improve Outcome: Progressing   Problem: Clinical Measurements: Goal: Ability to maintain clinical measurements within normal limits will improve Outcome: Progressing Goal: Will remain free from infection Outcome: Progressing Goal: Diagnostic test results will improve Outcome: Progressing Goal: Respiratory complications will improve Outcome: Progressing Goal: Cardiovascular complication will be avoided Outcome: Progressing   Problem: Activity: Goal: Risk for activity intolerance will decrease Outcome: Progressing   Problem: Nutrition: Goal: Adequate nutrition will be maintained Outcome: Progressing   Problem: Coping: Goal: Level of anxiety will decrease Outcome: Progressing   Problem: Elimination: Goal: Will not experience complications related to bowel motility Outcome: Progressing Goal: Will not experience complications related to  urinary retention Outcome: Progressing   Problem: Pain Management: Goal: General experience of comfort will improve Outcome: Progressing   Problem: Safety: Goal: Ability to remain free from injury will improve Outcome: Progressing   Problem: Skin Integrity: Goal: Risk for impaired skin integrity will decrease Outcome: Progressing

## 2023-10-13 NOTE — Evaluation (Signed)
Physical Therapy Evaluation Patient Details Name: Brendan Williams MRN: [id number] DOB: [birth year] Today's Date: 10/13/2023  History of Present Illness  62 y.o. male with medical history significant of PAD s/p multiple angiograms and R TMA presenting with foot pain and erythema after recent treatment for cellulitis with Bactrim.  He started with pinkie toe infection with PAD 3 years ago.  He had the toe taken off and eventually had TMA about 4 months ago.  It has never healed.  Vascular surgery has been planning for amputation. 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 He also has sepsis due to LE cellulitis/wound infection.   Clinical Impression  Patient received reclining in bed reporting reluctance to participate in PT due to pain. RN present and reports pain meds were given about 30 min prior. Patient agreeable to PT with encouragement. Patient alert and oriented and able to provide detailed history when not distracted by pain. He lives with his wife in a single story home with 3 steps to enter and bilateral handrails. He has a walk in shower with a seat and handheld shower head. Prior to hospitalization he shared a rollator with his wife and was mostly mod I for mobility and ADLs. He mostly had help for IADLs. He has experienced falls in the last 6 months. Upon PT evaluation, patient was very limited by pain and anxiety and likely has strength to complete mobility with improved independence once his pain and anxiety are better controlled. He required min A for supine to sit and insisted on a male visitor's assistance when going sit to stand and hopping towards the right at the edge of the bed. Patient completed sit <> stand from an elevated surface using RW and mod A from PT with min/stabilizing assistance from visitor per visitor report. When hopping towards the right, he required repeated cuing for direction and sequencing while he was overwhelmed with pain and anxiety. He had min A from PT  for stability in standing position and to move his walker appropriately. Patient has experienced a decline in functional independence. He currently requires physical assistance for mobility and has not yet successfully transferred to a chair or BSC from the bed. Patient would benefit from skilled physical therapy to address impairments and functional limitations (see PT Problem List below) to work towards stated goals and return to PLOF or maximal functional independence.       If plan is discharge home, recommend the following: A lot of help with walking and/or transfers;A lot of help with bathing/dressing/bathroom;Assist for transportation;Assistance with cooking/housework;Help with stairs or ramp for entrance   Can travel by private vehicle   No    Equipment Recommendations Wheelchair cushion (measurements PT)  Recommendations for Other Services       Functional Status Assessment Patient has had a recent decline in their functional status and demonstrates the ability to make significant improvements in function in a reasonable and predictable amount of time.     Precautions / Restrictions Precautions Precautions: Fall Restrictions Weight Bearing Restrictions Per Provider Order: No      Mobility  Bed Mobility Overal bed mobility: Needs Assistance Bed Mobility: Supine to Sit, Sit to Supine     Supine to sit: Min assist Sit to supine: Supervision   General bed mobility comments: Supine to sit: patient limited by high level of pain and guarding and resisted assistance when he appeared to be stuck halfway between supine and seated position. Sit to supine: able with supervision. Boost up  bed: able with supervision and head of bed lowered.    Transfers Overall transfer level: Needs assistance Equipment used: Rolling walker (2 wheels) Transfers: Sit to/from Stand Sit to Stand: Mod assist, From elevated surface           General transfer comment: Patient struggling with pain  and anxiety. Was unable to rise to standing using L LE and B UE from low bed position. Insisted on help from male visitor when he successfully went sit to stand from bed elevated position using B UE on RW. PT providing min A and unclear how much assistance visitor gave (he said stabilizing/min?). Patient required cuing for hand placement and body mechanics but had difficulty following cuing due to high level of pain and anxiety. P    Ambulation/Gait             Pre-gait activities: Patient practiced standing at walker and taking lateral hops while moving walker towards the right. He required repeated cuing for direction and sequencing while he was overwhelmed with pain and anxiety. He had min A from PT for stability in standing position and to move his walker appropriately. He also insisted upon a male visitor providing stabilizing assist at his left side. General Gait Details: Patient unable to safely take steps away from bed at this time due to high level of pain and anxiety.  Stairs            Wheelchair Mobility     Tilt Bed    Modified Rankin (Stroke Patients Only)       Balance Overall balance assessment: Needs assistance Sitting-balance support: Bilateral upper extremity supported Sitting balance-Leahy Scale: Fair Sitting balance - Comments: Patient able to sit at edge of bed with CGA/SBA. He was limited by avoidance of shifting weight onto right residual limb.   Standing balance support: Bilateral upper extremity supported, Reliant on assistive device for balance, During functional activity Standing balance-Leahy Scale: Poor Standing balance comment: Patient reliant on RW and min A to stand at edge of bed.                             Pertinent Vitals/Pain Pain Assessment Pain Assessment: Faces Pain Score: 6  (at rest after receiving pain meds) Faces Pain Scale: Hurts worst (during mobility) Pain Location: right residual limb Pain Descriptors /  Indicators: Guarding, Burning, Shooting, Crying, Grimacing (yelling at times during mobilty) Pain Intervention(s): Limited activity within patient's tolerance, Monitored during session, Premedicated before session, Repositioned    Home Living Family/patient expects to be discharged to:: Private residence Living Arrangements: Spouse/significant other (wife Brendan Williams) Available Help at Discharge: Family;Available 24 hours/day Type of Home: Mobile home Home Access: Stairs to enter Entrance Stairs-Rails: Right;Left;Can reach both Entrance Stairs-Number of Steps: 3   Home Layout: One level Home Equipment: Grab bars - tub/shower;Rollator (4 wheels);BSC/3in1;Shower seat;Hand held Programmer, systems (2 wheels)      Prior Function Prior Level of Function : History of Falls (last six months)             Mobility Comments: Ambulatory in home with rollator or no assistive device. He shares rollator with wife. Mod I with bed mobility and transfers. ADLs Comments: Gets minimal assist wtih ADLs, can sometimes do IADLs     Extremity/Trunk Assessment   Upper Extremity Assessment Upper Extremity Assessment:  (Unable to rise from bed at lowest setting, only using L LE and B UE on RW. States he  can lift his body up, but did not demonstrate.)    Lower Extremity Assessment Lower Extremity Assessment: RLE deficits/detail;LLE deficits/detail RLE Deficits / Details: fresh transfemoral amputation LLE Deficits / Details: Unable to rise from bed at lowest setting, only using L LE and B UE on RW    Cervical / Trunk Assessment Cervical / Trunk Assessment: Normal  Communication   Communication Communication: No apparent difficulties Cueing Techniques: Verbal cues  Cognition Arousal: Alert Behavior During Therapy: WFL for tasks assessed/performed, Anxious Overall Cognitive Status: Within Functional Limits for tasks assessed                                          General  Comments      Exercises Other Exercises Other Exercises: educated patient on role of PT in acute care setting, importance of early mobility, and process for recovery before getting a prosthetic limb.   Assessment/Plan    PT Assessment Patient needs continued PT services  PT Problem List Decreased strength;Decreased mobility;Decreased knowledge of precautions;Decreased activity tolerance;Decreased skin integrity;Decreased balance;Decreased knowledge of use of DME;Pain       PT Treatment Interventions DME instruction;Therapeutic exercise;Gait training;Balance training;Stair training;Neuromuscular re-education;Functional mobility training;Therapeutic activities;Patient/family education    PT Goals (Current goals can be found in the Care Plan section)  Acute Rehab PT Goals Patient Stated Goal: to start walking with a prosthetic limb PT Goal Formulation: With patient Time For Goal Achievement: 10/27/23 Potential to Achieve Goals: Poor (patient has unrealistic timeframe for use of prosthesis)    Frequency 7X/week     Co-evaluation               AM-PAC PT "6 Clicks" Mobility  Outcome Measure Help needed turning from your back to your side while in a flat bed without using bedrails?: A Little Help needed moving from lying on your back to sitting on the side of a flat bed without using bedrails?: A Little Help needed moving to and from a bed to a chair (including a wheelchair)?: A Lot Help needed standing up from a chair using your arms (e.g., wheelchair or bedside chair)?: A Lot Help needed to walk in hospital room?: A Lot Help needed climbing 3-5 steps with a railing? : Total 6 Click Score: 13    End of Session Equipment Utilized During Treatment: Gait belt Activity Tolerance: Patient limited by pain (limited by anxiety) Patient left: in bed;with family/visitor present;with call bell/phone within reach;with bed alarm set Nurse Communication: Mobility status PT Visit  Diagnosis: Difficulty in walking, not elsewhere classified (R26.2);Pain;Unsteadiness on feet (R26.81);Muscle weakness (generalized) (M62.81) Pain - Right/Left: Right Pain - part of body: Leg    Time: 1610-9604 PT Time Calculation (min) (ACUTE ONLY): 31 min   Charges:   PT Evaluation $PT Eval Moderate Complexity: 1 Mod PT Treatments $Therapeutic Activity: 8-22 mins PT General Charges $$ ACUTE PT VISIT: 1 Visit        Huntley Dec R. Ilsa Iha, PT, DPT 10/13/23, 6:09 PM

## 2023-10-14 DIAGNOSIS — I70221 Atherosclerosis of native arteries of extremities with rest pain, right leg: Secondary | ICD-10-CM | POA: Diagnosis not present

## 2023-10-14 LAB — CBC
HCT: 28.5 % — ABNORMAL LOW (ref 39.0–52.0)
Hemoglobin: 9.6 g/dL — ABNORMAL LOW (ref 13.0–17.0)
MCH: 29.5 pg (ref 26.0–34.0)
MCHC: 33.7 g/dL (ref 30.0–36.0)
MCV: 87.7 fL (ref 80.0–100.0)
Platelets: 526 10*3/uL — ABNORMAL HIGH (ref 150–400)
RBC: 3.25 MIL/uL — ABNORMAL LOW (ref 4.22–5.81)
RDW: 15.4 % (ref 11.5–15.5)
WBC: 14.1 10*3/uL — ABNORMAL HIGH (ref 4.0–10.5)
nRBC: 0 % (ref 0.0–0.2)

## 2023-10-14 LAB — CREATININE, SERUM
Creatinine, Ser: 0.67 mg/dL (ref 0.61–1.24)
GFR, Estimated: >60 mL/min (ref >60)

## 2023-10-14 LAB — HEPARIN LEVEL (UNFRACTIONATED)
Heparin Unfractionated: 0.14 [IU]/mL — ABNORMAL LOW (ref 0.30–0.70)
Heparin Unfractionated: 0.46 [IU]/mL (ref 0.30–0.70)
Heparin Unfractionated: 0.31 [IU]/mL (ref 0.30–0.70)

## 2023-10-14 LAB — APTT: aPTT: 42 s — ABNORMAL HIGH (ref 24–36)

## 2023-10-14 MED ORDER — ALUM & MAG HYDROXIDE-SIMETH 200-200-20 MG/5ML PO SUSP
15.0000 mL | Freq: Once | ORAL | Status: AC
  Administered 2023-10-14: 15 mL via ORAL
  Filled 2023-10-14: qty 30

## 2023-10-14 MED ORDER — HEPARIN BOLUS VIA INFUSION
2400.0000 [IU] | Freq: Once | INTRAVENOUS | Status: AC
  Administered 2023-10-14: 2400 [IU] via INTRAVENOUS
  Filled 2023-10-14: qty 2400

## 2023-10-14 NOTE — Plan of Care (Signed)

## 2023-10-14 NOTE — Consult Note (Signed)
PHARMACY - ANTICOAGULATION CONSULT NOTE  Pharmacy Consult for IV Heparin Indication:  PAD on apixaban prior to admission / pending amputation  Patient Measurements: Height: 6\' 1"  (185.4 cm) Weight: 77.2 kg (170 lb 3.1 oz) IBW/kg (Calculated) : 79.9 Heparin Dosing Weight: 79.4 kg  Labs: Recent Labs    10/12/23 0411 10/12/23 1230 10/13/23 0548 10/13/23 0747 10/13/23 1544 10/13/23 2306 10/14/23 0532 10/14/23 1348 10/14/23 2002  HGB 9.9*  --  8.3*  --   --   --  9.6*  --   --   HCT 30.3*  --  25.0*  --   --   --  28.5*  --   --   PLT 540*  --  478*  --   --   --  526*  --   --   APTT 89*   < >  --  59*  --  76* 42*  --   --   HEPARINUNFRC 0.41   < >  --  0.20*   < >  --  0.14* 0.46 0.31  CREATININE 0.76  --   --   --   --   --  0.67  --   --    < > = values in this interval not displayed.    Estimated Creatinine Clearance: 103.2 mL/min (by C-G formula based on SCr of 0.67 mg/dL).   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 Pharmacy consulted to initiate and manage heparin infusion for PAD while home apixaban is on hold pending surgery. Hgb drop  from 12 to 9. Continue to monitor.   1227 0411 HL 0.41, aPTT 89 HL/aPTT levels therapeutic and correlating x1 Heparin was stopped from PACU until the pt got to the floor. Restarted at 11:02. No rate change.  1227 1628 HL 0.22 subtherapeutic 12/27 2319 aPTT 54,  subtherapeutic 12/28 0747 aPTT 59/ HL 0.20 subtherapeutic, correlating  12/28 1544 HL 0.23 subtherapeutic 12/28 2306 aPTT 76, therapeutic x 1  12/29 0532 HL 0.14 subtherapeutic / aPTT 42, correlating 12/29 1348 HL 0.46 therapeutic x 1 12/29 2002 HL 0.31, therapeutic x2  Goal of Therapy:  Heparin level 0.3-0.7 units/ml aPTT 66 - 102 seconds Monitor platelets by anticoagulation protocol: Yes  Plan:  Heparin level remains therapeutic this evening, however, on lower side of goal range. Will slightly increase rate to keep patient in goal range and recheck HL in AM. Increase heparin infusion to 1950 units/hour Monitor daily heparin levels while on heparin infusion Monitor CBC and signs/symptoms of bleeding  Thank you for involving pharmacy in this patient's care.   Rockwell Alexandria, PharmD Clinical Pharmacist 10/14/2023 8:56 PM

## 2023-10-14 NOTE — Plan of Care (Signed)
  Problem: Education: Goal: Ability to describe self-care measures that may prevent or decrease complications (Diabetes Survival Skills Education) will improve Outcome: Progressing Goal: Individualized Educational Video(s) Outcome: Progressing   Problem: Coping: Goal: Ability to adjust to condition or change in health will improve Outcome: Progressing   

## 2023-10-14 NOTE — Consult Note (Signed)
PHARMACY - ANTICOAGULATION CONSULT NOTE  Pharmacy Consult for IV Heparin Indication:  PAD on apixaban prior to admission / pending amputation  Patient Measurements: Height: 6\' 1"  (185.4 cm) Weight: 77.2 kg (170 lb 3.1 oz) IBW/kg (Calculated) : 79.9 Heparin Dosing Weight: 79.4 kg  Labs: Recent Labs    10/12/23 0411 10/12/23 1230 10/13/23 0548 10/13/23 0747 10/13/23 1544 10/13/23 2306 10/14/23 0532 10/14/23 1348  HGB 9.9*  --  8.3*  --   --   --  9.6*  --   HCT 30.3*  --  25.0*  --   --   --  28.5*  --   PLT 540*  --  478*  --   --   --  526*  --   APTT 89*   < >  --  59*  --  76* 42*  --   HEPARINUNFRC 0.41   < >  --  0.20* 0.23*  --  0.14* 0.46  CREATININE 0.76  --   --   --   --   --  0.67  --    < > = values in this interval not displayed.    Estimated Creatinine Clearance: 103.2 mL/min (by C-G formula based on SCr of 0.67 mg/dL).   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 Pharmacy consulted to initiate and manage heparin infusion for PAD while home apixaban is on hold pending surgery. Hgb drop  from 12 to 9. Continue to monitor.   1227 0411 HL 0.41, aPTT 89 HL/aPTT levels therapeutic and correlating x1 Heparin was stopped from PACU until the pt got to the floor. Restarted at 11:02. No rate change.  1227 1628 HL 0.22 subtherapeutic 12/27 2319 aPTT 54, subtherapeutic 12/28 0747 aPTT 59/ HL 0.20  subtherapeutic, correlating  12/28 1544 HL 0.23 subtherapeutic 12/28 2306 aPTT 76, therapeutic x 1  12/29 0532 HL 0.14 subtherapeutic / aPTT 42, correlating 12/29 1348 HL 0.46 therapeutic x 1  Goal of Therapy:  Heparin level 0.3-0.7 units/ml aPTT 66 - 102 seconds Monitor platelets by anticoagulation protocol: Yes  Plan:  12/29 1348 HL 0.46 therapeutic x 1 Continue  heparin infusion at 1900 units/hour Check confirmatory HL in 6 hrs  Monitor CBC and signs/symptoms of bleeding  Thank you for involving pharmacy in this patient's care.   Bari Mantis PharmD Clinical Pharmacist 10/14/2023

## 2023-10-14 NOTE — Progress Notes (Signed)
PROGRESS NOTE    Brendan Williams  NWG:[id number] DOB: Jan 12, 1961 DOA: 10/11/2023 PCP: Gildardo Pounds, PA    Brief Narrative:  62 y.o. male with medical history significant of PAD s/p multiple angiograms and R TMA presenting with foot pain and erythema after recent treatment for cellulitis with Bactrim.  He started with pinkie toe infection with PAD 3 years ago.  He had the toe taken off and eventually had TMA about 4 months ago.  It has never healed.  Vascular surgery has been planning for amputation.  He has been feeling bad for a while.  No fever, having sweats   Seen in consultation by vascular surgery.  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  Tolerated procedure well.  No immediate postoperative complications.   Assessment & Plan:   Principal Problem:   Critical limb ischemia of right lower extremity (HCC) Active Problems:   Tobacco use disorder   PAD (peripheral artery disease) (HCC)   Sepsis (HCC)   Hyperlipidemia   Neuropathy   Lactic acidosis   Lower extremity cellulitis  Critical limb ischemia, PAD Patient with recent angiogram showing nonreconstructable disease He was recommended for AKA and declined admission He was treated with Bactrim but has persistent LE infection with mottling of the leg He returned today and now agrees to amputation Status post right AKA 12/27 Plan: Pain control Continue heparin drip for today If hemoglobin stabilizes we will reinitiate oral anticoagulation tomorrow Monitor wound Vascular surgery follow-up  Sepsis due to LE cellulitis/wound infection SIRS criteria in this patient includes: Leukocytosis, tachycardia  Patient has evidence of acute organ failure with elevated lactate >2 that is not easily explained by another condition. While awaiting blood cultures, this appears to be a preseptic condition. Sepsis protocol initiated Suspected source is LE wound infection Plan: Broad-spectrum IV antibiotics for  now Monitor vitals and fever curve Diet as tolerated Can likely de-escalate in 24 hours  Tobacco dependence Encourage cessation - he reports that he has "quit", last cigarette was about 3 days ago Patch declined by patient   Neuropathy Continue gabapentin   HLD Continue atorvastatin      DVT prophylaxis: Heparin GTT Code Status: Full Family Communication: Spouse at bedside 12/27, 12/29 Disposition Plan: Status is: Inpatient Remains inpatient appropriate because: POD #3 status post right AKA   Level of care: Progressive  Consultants:  Vascular surgery  Procedures:  Right AKA 12/27  Antimicrobials: Vancomycin Ceftriaxone   Subjective: Seen examined.  Resting in bed.  Answer questions appropriately.  Pain well-controlled.  Objective: Vitals:   10/13/23 2005 10/14/23 0101 10/14/23 0507 10/14/23 0903  BP: 92/65 112/65 115/69 (!) 111/100  Pulse: 63 65 74 66  Resp: 16   16  Temp: 98.7 F (37.1 C)  99.5 F (37.5 C) 98.4 F (36.9 C)  TempSrc: Oral  Oral   SpO2: 98%  98% 96%  Weight:      Height:        Intake/Output Summary (Last 24 hours) at 10/14/2023 1205 Last data filed at 10/14/2023 0353 Gross per 24 hour  Intake 1387.95 ml  Output 700 ml  Net 687.95 ml   Filed Weights   10/11/23 1600 10/13/23 0500  Weight: 79.4 kg 77.2 kg    Examination:  General exam: No acute distress Respiratory system: Clear lungs.  Normal work of breathing.  Room air Cardiovascular system: S1-S2, RRR, no murmurs, no pedal edema Gastrointestinal system: Soft, NT/ND, normal bowel sounds Central nervous system: Alert and oriented.  No focal neurological deficits. Extremities: Right AKA.  Stump in surgical dressings.  CDI Skin: No rashes, lesions or ulcers Psychiatry: Judgement and insight appear normal. Mood & affect appropriate.     Data Reviewed: I have personally reviewed following labs and imaging studies  CBC: Recent Labs  Lab 10/11/23 1133 10/12/23 0411  10/13/23 0548 10/14/23 0532  WBC 22.2* 24.8* 15.8* 14.1*  NEUTROABS 20.4*  --   --   --   HGB 12.5* 9.9* 8.3* 9.6*  HCT 37.2* 30.3* 25.0* 28.5*  MCV 88.4 91.8 90.3 87.7  PLT 664* 540* 478* 526*   Basic Metabolic Panel: Recent Labs  Lab 10/11/23 1133 10/12/23 0411 10/14/23 0532  NA 134* 138  --   K 4.1 4.1  --   CL 93* 101  --   CO2 25 27  --   GLUCOSE 136* 105*  --   BUN 11 10  --   CREATININE 1.02 0.76 0.67  CALCIUM 8.9 8.1*  --   MG  --  1.9  --    GFR: Estimated Creatinine Clearance: 103.2 mL/min (by C-G formula based on SCr of 0.67 mg/dL). Liver Function Tests: Recent Labs  Lab 10/11/23 1133 10/12/23 0411  AST 22 17  ALT 13 10  ALKPHOS 80 58  BILITOT 0.4 0.4  PROT 7.7 6.1*  ALBUMIN 3.0* 2.3*   No results for input(s): "LIPASE", "AMYLASE" in the last 168 hours. No results for input(s): "AMMONIA" in the last 168 hours. Coagulation Profile: Recent Labs  Lab 10/11/23 1431  INR 1.1   Cardiac Enzymes: No results for input(s): "CKTOTAL", "CKMB", "CKMBINDEX", "TROPONINI" in the last 168 hours. BNP (last 3 results) No results for input(s): "PROBNP" in the last 8760 hours. HbA1C: Recent Labs    10/11/23 1636 10/12/23 0411  HGBA1C 6.2* 6.3*   CBG: Recent Labs  Lab 10/11/23 1649 10/11/23 2217 10/12/23 1254  GLUCAP 71 117* 122*   Lipid Profile: No results for input(s): "CHOL", "HDL", "LDLCALC", "TRIG", "CHOLHDL", "LDLDIRECT" in the last 72 hours. Thyroid Function Tests: No results for input(s): "TSH", "T4TOTAL", "FREET4", "T3FREE", "THYROIDAB" in the last 72 hours. Anemia Panel: No results for input(s): "VITAMINB12", "FOLATE", "FERRITIN", "TIBC", "IRON", "RETICCTPCT" in the last 72 hours. Sepsis Labs: Recent Labs  Lab 10/11/23 1133 10/11/23 1636 10/11/23 2055 10/12/23 0135  PROCALCITON  --  1.59  --   --   LATICACIDVEN 2.9* 5.0* 2.1* 1.2    Recent Results (from the past 240 hours)  Blood Culture (routine x 2)     Status: None (Preliminary  result)   Collection Time: 10/11/23  2:20 PM   Specimen: BLOOD  Result Value Ref Range Status   Specimen Description BLOOD LEFT ANTECUBITAL  Final   Special Requests   Final    BOTTLES DRAWN AEROBIC AND ANAEROBIC Blood Culture adequate volume   Culture  Setup Time PENDING  Incomplete   Culture   Final    NO GROWTH 3 DAYS Performed at [facility], [address] [place], [place] [address]    Report Status PENDING  Incomplete  Blood Culture (routine x 2)     Status: None (Preliminary result)   Collection Time: 10/11/23  2:35 PM   Specimen: BLOOD  Result Value Ref Range Status   Specimen Description BLOOD BLOOD RIGHT FOREARM  Final   Special Requests   Final    BOTTLES DRAWN AEROBIC AND ANAEROBIC Blood Culture adequate volume   Culture  Setup Time PENDING  Incomplete   Culture  Final    NO GROWTH 3 DAYS Performed at [facility], [address] [place], [place] [address]    Report Status PENDING  Incomplete         Radiology Studies: No results found.       Scheduled Meds:  vitamin C  500 mg Oral BID   atorvastatin  40 mg Oral Daily   docusate sodium  100 mg Oral BID   feeding supplement  237 mL Oral TID BM   gabapentin  300 mg Oral BID   multivitamin with minerals  1 tablet Oral Daily   pantoprazole  40 mg Oral Daily   sodium chloride flush  3 mL Intravenous Q12H   zinc sulfate (50mg  elemental zinc)  220 mg Oral Daily   Continuous Infusions:  cefTRIAXone (ROCEPHIN)  IV 2 g (10/14/23 0054)   heparin 1,900 Units/hr (10/14/23 0803)   metronidazole 500 mg (10/14/23 0515)   vancomycin 1,250 mg (10/14/23 0806)     LOS: 3 days     Tresa Moore, MD [facility]   If 7PM-7AM, please contact night-coverage  10/14/2023, 12:05 PM

## 2023-10-14 NOTE — Progress Notes (Signed)
Physical Therapy Treatment Patient Details Name: Brendan Williams MRN: [id number] DOB: June 24, 1961 Today's Date: 10/14/2023   History of Present Illness 62 y.o. male with medical history significant of PAD s/p multiple angiograms and R TMA presenting with foot pain and erythema after recent treatment for cellulitis with Bactrim.  He started with pinkie toe infection with PAD 3 years ago.  He had the toe taken off and eventually had TMA about 4 months ago.  It has never healed.  Vascular surgery has been planning for amputation. 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 He also has sepsis due to LE cellulitis/wound infection.    PT Comments  Pt seen for PT tx with pt agreeable to tx, wife present in room. Pt preservative on pain, requires significantly extra time to complete all mobility with PT providing encouragement throughout session.  Pt is able to complete bed mobility with supervision with use of hospital bed features, STS & step pivot bed>recliner with RW & min assist. PT attempts to educate pt on various things throughout session but pt not very open to education. Will continue to follow pt acutely to progress mobility as able.   If plan is discharge home, recommend the following: A lot of help with walking and/or transfers;A lot of help with bathing/dressing/bathroom;Assist for transportation;Assistance with cooking/housework;Help with stairs or ramp for entrance   Can travel by private vehicle     No  Equipment Recommendations  Wheelchair cushion (measurements PT);Wheelchair (measurements PT)    Recommendations for Other Services       Precautions / Restrictions Precautions Precautions: Fall Restrictions Weight Bearing Restrictions Per Provider Order: No     Mobility  Bed Mobility Overal bed mobility: Needs Assistance Bed Mobility: Supine to Sit     Supine to sit: Supervision, HOB elevated, Used rails     General bed mobility comments: Pt able to transition to  long sitting with HOB elevated, bed rails, requires significantly extra time to turn & come to sitting EOB but is able to do it without assistance.    Transfers Overall transfer level: Needs assistance Equipment used: Rolling walker (2 wheels) Transfers: Bed to chair/wheelchair/BSC Sit to Stand: Min assist   Step pivot transfers: Min assist       General transfer comment: STS from EOB with cuing re: hand placement, step pivot bed>recliner on R with RW & min assist, cuing to fully turn to recliner but poor return demo    Ambulation/Gait                   Stairs             Wheelchair Mobility     Tilt Bed    Modified Rankin (Stroke Patients Only)       Balance Overall balance assessment: Needs assistance Sitting-balance support: Bilateral upper extremity supported Sitting balance-Leahy Scale: Fair     Standing balance support: Bilateral upper extremity supported, During functional activity, Reliant on assistive device for balance Standing balance-Leahy Scale: Fair                              Cognition Arousal: Alert Behavior During Therapy: WFL for tasks assessed/performed, Anxious Overall Cognitive Status: Within Functional Limits for tasks assessed                                 General Comments:  perseverative on pain throughout session, not open to education/cuing        Exercises      General Comments General comments (skin integrity, edema, etc.): PT educated pt on desensitization techniques to help reduce phantom sensation & pain. PT also attempted to educate pt on need for neutral hip alignment as pt maintains hip flexion throughout step pivot transfer but pt not open to education.      Pertinent Vitals/Pain Pain Assessment Pain Assessment: Faces Faces Pain Scale: Hurts worst Pain Location: R residual limb, phantom pain/sensation Pain Descriptors / Indicators: Grimacing, Guarding, Discomfort, Crying Pain  Intervention(s): Monitored during session, Patient requesting pain meds-RN notified, Limited activity within patient's tolerance, Utilized relaxation techniques, Repositioned    Home Living                          Prior Function            PT Goals (current goals can now be found in the care plan section) Acute Rehab PT Goals Patient Stated Goal: decreased pain PT Goal Formulation: With patient Time For Goal Achievement: 10/27/23 Potential to Achieve Goals: Fair Progress towards PT goals: Progressing toward goals    Frequency    7X/week      PT Plan      Co-evaluation              AM-PAC PT "6 Clicks" Mobility   Outcome Measure  Help needed turning from your back to your side while in a flat bed without using bedrails?: A Little Help needed moving from lying on your back to sitting on the side of a flat bed without using bedrails?: A Little Help needed moving to and from a bed to a chair (including a wheelchair)?: A Little Help needed standing up from a chair using your arms (e.g., wheelchair or bedside chair)?: A Little Help needed to walk in hospital room?: A Lot Help needed climbing 3-5 steps with a railing? : Total 6 Click Score: 15    End of Session   Activity Tolerance: Patient limited by pain Patient left: in chair;with chair alarm set;with call bell/phone within reach;with family/visitor present (lab tech in room to draw blood) Nurse Communication: Mobility status PT Visit Diagnosis: Difficulty in walking, not elsewhere classified (R26.2);Pain;Unsteadiness on feet (R26.81);Muscle weakness (generalized) (M62.81);Other abnormalities of gait and mobility (R26.89) Pain - Right/Left: Right Pain - part of body: Leg     Time: 9562-1308 PT Time Calculation (min) (ACUTE ONLY): 23 min  Charges:    $Therapeutic Activity: 23-37 mins PT General Charges $$ ACUTE PT VISIT: 1 Visit                     Aleda Grana, PT, DPT 10/14/23, 2:00  PM   Sandi Mariscal 10/14/2023, 1:59 PM

## 2023-10-14 NOTE — Consult Note (Signed)
PHARMACY - ANTICOAGULATION CONSULT NOTE  Pharmacy Consult for IV Heparin Indication:  PAD on apixaban prior to admission / pending amputation  Patient Measurements: Height: 6\' 1"  (185.4 cm) Weight: 77.2 kg (170 lb 3.1 oz) IBW/kg (Calculated) : 79.9 Heparin Dosing Weight: 79.4 kg  Labs: Recent Labs    10/11/23 1133 10/11/23 1133 10/11/23 1431 10/12/23 0411 10/12/23 1230 10/13/23 0548 10/13/23 0747 10/13/23 1544 10/13/23 2306 10/14/23 0532  HGB 12.5*  --   --  9.9*  --  8.3*  --   --   --  9.6*  HCT 37.2*  --   --  30.3*  --  25.0*  --   --   --  28.5*  PLT 664*  --   --  540*  --  478*  --   --   --  526*  APTT  --    < > 47* 89*   < >  --  59*  --  76* 42*  LABPROT  --   --  14.6  --   --   --   --   --   --   --   INR  --   --  1.1  --   --   --   --   --   --   --   HEPARINUNFRC  --    < > 0.65 0.41   < >  --  0.20* 0.23*  --  0.14*  CREATININE 1.02  --   --  0.76  --   --   --   --   --  0.67   < > = values in this interval not displayed.    Estimated Creatinine Clearance: 103.2 mL/min (by C-G formula based on SCr of 0.67 mg/dL).   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 Pharmacy consulted to initiate and manage heparin infusion for PAD while home apixaban is on hold pending surgery. Hgb drop  from 12 to 9. Continue to monitor.   1227 0411 HL 0.41, aPTT 89 HL/aPTT levels therapeutic and correlating  x1 Heparin was stopped from PACU until the pt got to the floor. Restarted at 11:02. No rate change.  1227 1628 HL 0.22 subtherapeutic 12/27 2319 aPTT 54, subtherapeutic 12/28 0747 aPTT 59/ HL 0.20 subtherapeutic, correlating  12/28 1544 HL 0.23 subtherapeutic 12/28 2306 aPTT 76, therapeutic x 1  12/29 0532 HL 0.14 subtherapeutic / aPTT 42, correlating  Goal of Therapy:  Heparin level 0.3-0.7 units/ml aPTT 66 - 102 seconds Monitor platelets by anticoagulation protocol: Yes  Plan:  Bolus 2400 units x 1 Increase heparin infusion at 1900 units/hour Recheck HL in 6 hrs after rate change Monitor CBC and signs/symptoms of bleeding  Thank you for involving pharmacy in this patient's care.   Otelia Sergeant, PharmD, [facility] 10/14/2023 7:06  AM

## 2023-10-14 NOTE — Plan of Care (Signed)
  Problem: Coping: Goal: Ability to adjust to condition or change in health will improve Outcome: Progressing   Problem: Skin Integrity: Goal: Risk for impaired skin integrity will decrease Outcome: Progressing   

## 2023-10-15 DIAGNOSIS — I70221 Atherosclerosis of native arteries of extremities with rest pain, right leg: Secondary | ICD-10-CM | POA: Diagnosis not present

## 2023-10-15 LAB — CBC
HCT: 27.6 % — ABNORMAL LOW (ref 39.0–52.0)
Hemoglobin: 9.2 g/dL — ABNORMAL LOW (ref 13.0–17.0)
MCH: 29.7 pg (ref 26.0–34.0)
MCHC: 33.3 g/dL (ref 30.0–36.0)
MCV: 89 fL (ref 80.0–100.0)
Platelets: 468 10*3/uL — ABNORMAL HIGH (ref 150–400)
RBC: 3.1 MIL/uL — ABNORMAL LOW (ref 4.22–5.81)
RDW: 15.5 % (ref 11.5–15.5)
WBC: 14 10*3/uL — ABNORMAL HIGH (ref 4.0–10.5)
nRBC: 0 % (ref 0.0–0.2)

## 2023-10-15 LAB — HEPARIN LEVEL (UNFRACTIONATED): Heparin Unfractionated: 0.31 [IU]/mL (ref 0.30–0.70)

## 2023-10-15 MED ORDER — ASPIRIN 81 MG PO TBEC
81.0000 mg | DELAYED_RELEASE_TABLET | Freq: Every day | ORAL | Status: DC
Start: 2023-10-15 — End: 2023-10-19
  Administered 2023-10-15 – 2023-10-19 (×5): 81 mg via ORAL
  Filled 2023-10-15 (×5): qty 1

## 2023-10-15 MED ORDER — APIXABAN 5 MG PO TABS
5.0000 mg | ORAL_TABLET | Freq: Two times a day (BID) | ORAL | Status: DC
  Administered 2023-10-15 – 2023-10-19 (×9): 5 mg via ORAL
  Filled 2023-10-15 (×9): qty 1

## 2023-10-15 NOTE — Progress Notes (Signed)
harmacy Antibiotic Note  Brendan Williams is a 62 y.o. male admitted on 10/11/2023 with  lower extremity wound infection . PMH significant of PAD s/p multiple angiograms and R TMA presenting with foot pain and erythema after recent treatment for cellulitis with Bactrim. He started with pinkie toe infection with PAD 3 years ago. He had the toe taken off and eventually had TMA about 4 months ago. It has never healed. Vascular surgery has been planning for amputation. Pharmacy has been consulted for vancomycin dosing.  Today, 10/15/2023:  Day 5 of antibiotics WBC trending down; 22.2 > 14.0  Afebrile  Renal function at baseline Underwent right AKA 12/27   Plan: Continue vancomycin 1250 mg IV Q12H  Goal AUC 400 - 550 Estimated AUC 499.4  Estimated Cmin 13.2  Scr 0.8, TBW, Vd 0.72  Patient also receiving ceftriaxone 2 gm IV daily and metronidazole 500 mg IV Q12H Per vascular, will determine antibiotic plan tomorrow following dressing change  Pharmacy will continue to monitor and dose adjust appropriately   Height: 6\' 1"  (185.4 cm) Weight: 77.2 kg (170 lb 3.1 oz) IBW/kg (Calculated) : 79.9  Temp (24hrs), Avg:98.9 F (37.2 C), Min:98.4 F (36.9 C), Max:99.5 F (37.5 C)  Recent Labs  Lab 10/11/23 1133 10/11/23 1636 10/11/23 2055 10/12/23 0135 10/12/23 0411 10/13/23 0548 10/14/23 0532 10/15/23 0243  WBC 22.2*  --   --   --  24.8* 15.8* 14.1* 14.0*  CREATININE 1.02  --   --   --  0.76  --  0.67  --   LATICACIDVEN 2.9* 5.0* 2.1* 1.2  --   --   --   --     Estimated Creatinine Clearance: 103.2 mL/min (by C-G formula based on SCr of 0.67 mg/dL).    No Known Allergies  Antimicrobials this admission: Cefepime 12/26 x 1 Ceftriaxone 12/26 >> Flagyl 12/26 >> Vancomycin 12/26 >>   Microbiology results: 12/26 BCx: NGTD  Thank you for allowing pharmacy to be a part of this patient's care.  Littie Deeds, PharmD Pharmacy Resident  10/15/2023 8:13 AM

## 2023-10-15 NOTE — Plan of Care (Signed)
  Problem: Clinical Measurements: Goal: Ability to maintain clinical measurements within normal limits will improve Outcome: Progressing Goal: Will remain free from infection Outcome: Progressing Goal: Diagnostic test results will improve Outcome: Progressing Goal: Respiratory complications will improve Outcome: Progressing Goal: Cardiovascular complication will be avoided Outcome: Progressing   Problem: Pain Management: Goal: General experience of comfort will improve Outcome: Progressing   Problem: Safety: Goal: Ability to remain free from injury will improve Outcome: Progressing

## 2023-10-15 NOTE — Progress Notes (Signed)
PROGRESS NOTE    Brendan Williams  WUJ:[id number] DOB: 1961/06/11 DOA: 10/11/2023 PCP: Gildardo Pounds, PA    Brief Narrative:  62 y.o. male with medical history significant of PAD s/p multiple angiograms and R TMA presenting with foot pain and erythema after recent treatment for cellulitis with Bactrim.  He started with pinkie toe infection with PAD 3 years ago.  He had the toe taken off and eventually had TMA about 4 months ago.  It has never healed.  Vascular surgery has been planning for amputation.  He has been feeling bad for a while.  No fever, having sweats   Seen in consultation by vascular surgery.  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  Tolerated procedure well.  No immediate postoperative complications.   Assessment & Plan:   Principal Problem:   Critical limb ischemia of right lower extremity (HCC) Active Problems:   Tobacco use disorder   PAD (peripheral artery disease) (HCC)   Sepsis (HCC)   Hyperlipidemia   Neuropathy   Lactic acidosis   Lower extremity cellulitis  Critical limb ischemia, PAD Patient with recent angiogram showing nonreconstructable disease He was recommended for AKA and declined admission He was treated with Bactrim but has persistent LE infection with mottling of the leg He returned today and now agrees to amputation Status post right AKA 12/27 Plan: Pain control Continue heparin drip for now Would like to transition back to oral anticoagulation sometime in the next 24 hours Vascular surgery follow-up  Sepsis due to LE cellulitis/wound infection SIRS criteria in this patient includes: Leukocytosis, tachycardia  Patient has evidence of acute organ failure with elevated lactate >2 that is not easily explained by another condition. While awaiting blood cultures, this appears to be a preseptic condition. Sepsis protocol initiated Suspected source is LE wound infection Plan: Broad-spectrum IV antibiotics for now Monitor  vitals and fever curve Diet as tolerated Can likely discontinue antibiotics as I believe source control has been achieved  Tobacco dependence Encourage cessation - he reports that he has "quit", last cigarette was about 3 days ago Patch declined by patient   Neuropathy Continue gabapentin   HLD Continue atorvastatin      DVT prophylaxis: Heparin GTT Code Status: Full Family Communication: Spouse at bedside 12/27, 12/29 Disposition Plan: Status is: Inpatient Remains inpatient appropriate because: POD #4 status post right AKA   Level of care: Progressive  Consultants:  Vascular surgery  Procedures:  Right AKA 12/27  Antimicrobials: Vancomycin Ceftriaxone   Subjective: Seen and examined.  Resting in bed.  No physical distress.  No complaints of pain.  Objective: Vitals:   10/14/23 1732 10/14/23 2254 10/15/23 0449 10/15/23 0735  BP:  111/84 113/70 131/81  Pulse: 70 70 66 65  Resp:  18 18 18   Temp:  99.5 F (37.5 C) 99 F (37.2 C) 98.6 F (37 C)  TempSrc:  Oral Oral   SpO2: 97% 99% 96% 99%  Weight:      Height:        Intake/Output Summary (Last 24 hours) at 10/15/2023 1031 Last data filed at 10/15/2023 0736 Gross per 24 hour  Intake --  Output 1525 ml  Net -1525 ml   Filed Weights   10/11/23 1600 10/13/23 0500  Weight: 79.4 kg 77.2 kg    Examination:  General exam: NAD Respiratory system: Lungs clear normal work of breathing.  Room air Cardiovascular system: S1-S2, RRR, no murmurs, no pedal edema Gastrointestinal system: Soft, NT/ND, normal bowel  sounds Central nervous system: Alert and oriented. No focal neurological deficits. Extremities: Right AKA.  Stump in surgical dressings.  CDI Skin: No rashes, lesions or ulcers Psychiatry: Judgement and insight appear normal. Mood & affect appropriate.     Data Reviewed: I have personally reviewed following labs and imaging studies  CBC: Recent Labs  Lab 10/11/23 1133 10/12/23 0411  10/13/23 0548 10/14/23 0532 10/15/23 0243  WBC 22.2* 24.8* 15.8* 14.1* 14.0*  NEUTROABS 20.4*  --   --   --   --   HGB 12.5* 9.9* 8.3* 9.6* 9.2*  HCT 37.2* 30.3* 25.0* 28.5* 27.6*  MCV 88.4 91.8 90.3 87.7 89.0  PLT 664* 540* 478* 526* 468*   Basic Metabolic Panel: Recent Labs  Lab 10/11/23 1133 10/12/23 0411 10/14/23 0532  NA 134* 138  --   K 4.1 4.1  --   CL 93* 101  --   CO2 25 27  --   GLUCOSE 136* 105*  --   BUN 11 10  --   CREATININE 1.02 0.76 0.67  CALCIUM 8.9 8.1*  --   MG  --  1.9  --    GFR: Estimated Creatinine Clearance: 103.2 mL/min (by C-G formula based on SCr of 0.67 mg/dL). Liver Function Tests: Recent Labs  Lab 10/11/23 1133 10/12/23 0411  AST 22 17  ALT 13 10  ALKPHOS 80 58  BILITOT 0.4 0.4  PROT 7.7 6.1*  ALBUMIN 3.0* 2.3*   No results for input(s): "LIPASE", "AMYLASE" in the last 168 hours. No results for input(s): "AMMONIA" in the last 168 hours. Coagulation Profile: Recent Labs  Lab 10/11/23 1431  INR 1.1   Cardiac Enzymes: No results for input(s): "CKTOTAL", "CKMB", "CKMBINDEX", "TROPONINI" in the last 168 hours. BNP (last 3 results) No results for input(s): "PROBNP" in the last 8760 hours. HbA1C: No results for input(s): "HGBA1C" in the last 72 hours.  CBG: Recent Labs  Lab 10/11/23 1649 10/11/23 2217 10/12/23 1254  GLUCAP 71 117* 122*   Lipid Profile: No results for input(s): "CHOL", "HDL", "LDLCALC", "TRIG", "CHOLHDL", "LDLDIRECT" in the last 72 hours. Thyroid Function Tests: No results for input(s): "TSH", "T4TOTAL", "FREET4", "T3FREE", "THYROIDAB" in the last 72 hours. Anemia Panel: No results for input(s): "VITAMINB12", "FOLATE", "FERRITIN", "TIBC", "IRON", "RETICCTPCT" in the last 72 hours. Sepsis Labs: Recent Labs  Lab 10/11/23 1133 10/11/23 1636 10/11/23 2055 10/12/23 0135  PROCALCITON  --  1.59  --   --   LATICACIDVEN 2.9* 5.0* 2.1* 1.2    Recent Results (from the past 240 hours)  Blood Culture (routine  x 2)     Status: None (Preliminary result)   Collection Time: 10/11/23  2:20 PM   Specimen: BLOOD  Result Value Ref Range Status   Specimen Description BLOOD LEFT ANTECUBITAL  Final   Special Requests   Final    BOTTLES DRAWN AEROBIC AND ANAEROBIC Blood Culture adequate volume   Culture  Setup Time PENDING  Incomplete   Culture   Final    NO GROWTH 4 DAYS Performed at [facility], [address], [place], [place] [address]    Report Status PENDING  Incomplete  Blood Culture (routine x 2)     Status: None (Preliminary result)   Collection Time: 10/11/23  2:35 PM   Specimen: BLOOD  Result Value Ref Range Status   Specimen Description BLOOD BLOOD RIGHT FOREARM  Final   Special Requests   Final    BOTTLES DRAWN AEROBIC AND ANAEROBIC Blood Culture adequate  volume   Culture  Setup Time PENDING  Incomplete   Culture   Final    NO GROWTH 4 DAYS Performed at [facility], [address], [place], [place] [address]    Report Status PENDING  Incomplete         Radiology Studies: No results found.       Scheduled Meds:  vitamin C  500 mg Oral BID   atorvastatin  40 mg Oral Daily   docusate sodium  100 mg Oral BID   feeding supplement  237 mL Oral TID BM   gabapentin  300 mg Oral BID   multivitamin with minerals  1 tablet Oral Daily   pantoprazole  40 mg Oral Daily   sodium chloride flush  3 mL Intravenous Q12H   zinc sulfate (50mg  elemental zinc)  220 mg Oral Daily   Continuous Infusions:  cefTRIAXone (ROCEPHIN)  IV 2 g (10/14/23 2341)   heparin 2,050 Units/hr (10/15/23 0748)   metronidazole 500 mg (10/15/23 0344)   vancomycin 1,250 mg (10/15/23 0511)     LOS: 4 days     Tresa Moore, MD [facility]   If 7PM-7AM, please contact night-coverage  10/15/2023, 10:31 AM

## 2023-10-15 NOTE — Consult Note (Signed)
PHARMACY - ANTICOAGULATION CONSULT NOTE  Pharmacy Consult for IV Heparin Indication:  PAD on apixaban prior to admission / pending amputation  Patient Measurements: Height: 6\' 1"  (185.4 cm) Weight: 77.2 kg (170 lb 3.1 oz) IBW/kg (Calculated) : 79.9 Heparin Dosing Weight: 79.4 kg  Labs: Recent Labs    10/13/23 0548 10/13/23 0747 10/13/23 1544 10/13/23 2306 10/14/23 0532 10/14/23 1348 10/14/23 2002 10/15/23 0243  HGB 8.3*  --   --   --  9.6*  --   --  9.2*  HCT 25.0*  --   --   --  28.5*  --   --  27.6*  PLT 478*  --   --   --  526*  --   --  468*  APTT  --  59*  --  76* 42*  --   --   --   HEPARINUNFRC  --  0.20*   < >  --  0.14* 0.46 0.31 0.31  CREATININE  --   --   --   --  0.67  --   --   --    < > = values in this interval not displayed.    Estimated Creatinine Clearance: 103.2 mL/min (by C-G formula based on SCr of 0.67 mg/dL).   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 Pharmacy consulted to initiate and manage heparin infusion for PAD while home apixaban is on hold pending surgery. Hgb drop  from 12 to 9. Continue to monitor.   1227 0411 HL 0.41, aPTT 89 HL/aPTT levels therapeutic and correlating x1 Heparin was stopped from PACU until the pt got to the floor. Restarted at 11:02. No rate change.  1227 1628 HL 0.22 subtherapeutic 12/27 2319 aPTT 54, subtherapeutic 12/28 0747 aPTT 59/ HL 0.20  subtherapeutic, correlating  12/28 1544 HL 0.23 subtherapeutic 12/28 2306 aPTT 76, therapeutic x 1  12/29 0532 HL 0.14 subtherapeutic / aPTT 42, correlating 12/29 1348 HL 0.46 therapeutic x 1 12/29 2002 HL 0.31, therapeutic x2 12/30 0243 HL 0.31, therapeutic x 3  Goal of Therapy:  Heparin level 0.3-0.7 units/ml aPTT 66 - 102 seconds Monitor platelets by anticoagulation protocol: Yes  Plan:  Heparin level remains therapeutic however, remains on lower side of goal range despite rate increase.  Will slightly increase rate to keep patient in goal range and recheck HL at 1400 Increase heparin infusion to 2050 units/hour Monitor daily heparin levels while on heparin infusion Monitor CBC and signs/symptoms of bleeding  Thank you for involving pharmacy in this patient's care.   Otelia Sergeant, PharmD, [facility] 10/15/2023 5:42 AM

## 2023-10-15 NOTE — Plan of Care (Signed)
  Problem: Education: Goal: Ability to describe self-care measures that may prevent or decrease complications (Diabetes Survival Skills Education) will improve Outcome: Progressing Goal: Individualized Educational Video(s) Outcome: Progressing   Problem: Coping: Goal: Ability to adjust to condition or change in health will improve Outcome: Progressing   

## 2023-10-15 NOTE — Progress Notes (Signed)
  Progress Note    10/15/2023 12:18 PM 3 Days Post-Op  Subjective: Brendan Williams is a 62 year old male now status postop day # 3 from right above-the-knee amputation.  Patient is resting comfortably in bed this morning.  He endorses some phantom nerve pain but otherwise states everything is tolerable.  Patient was told dressing will be changed tomorrow.  After this he may transfer to rehab before going home.  We had a long discussion today about falling with specifically falling on his stump which would require another surgery to fix.  Patient verbalizes understanding and is willing to follow instructions related to ambulation.  No complications overnight.  Vitals all remained stable.   Vitals:   10/15/23 0735 10/15/23 1110  BP: 131/81 113/74  Pulse: 65 63  Resp: 18 18  Temp: 98.6 F (37 C) 98.8 F (37.1 C)  SpO2: 99% 97%   Physical Exam: Cardiac:  RRR, normal S1 and S2, no murmurs rubs or gallops appreciated. Lungs: Clear on auscultation throughout but diminished in the bases.  No rales rhonchi or wheezing noted. Incisions: Right AKA incision covered with dressing.  No drainage noted today. Extremities: Right above-the-knee amputation with dressing clean dry and intact.  No drainage noted.  Left lower extremity warm to touch with Doppler pulses. Abdomen: Positive bowel sounds throughout, soft, nontender and nondistended. Neurologic: Alert and oriented x 3, answers all questions and follows commands appropriately.  CBC    Component Value Date/Time   WBC 14.0 (H) 10/15/2023 0243   RBC 3.10 (L) 10/15/2023 0243   HGB 9.2 (L) 10/15/2023 0243   HCT 27.6 (L) 10/15/2023 0243   PLT 468 (H) 10/15/2023 0243   MCV 89.0 10/15/2023 0243   MCH 29.7 10/15/2023 0243   MCHC 33.3 10/15/2023 0243   RDW 15.5 10/15/2023 0243   LYMPHSABS 0.7 10/11/2023 1133   MONOABS 0.9 10/11/2023 1133   EOSABS 0.0 10/11/2023 1133   BASOSABS 0.0 10/11/2023 1133    BMET    Component Value Date/Time   NA  138 10/12/2023 0411   K 4.1 10/12/2023 0411   CL 101 10/12/2023 0411   CO2 27 10/12/2023 0411   GLUCOSE 105 (H) 10/12/2023 0411   BUN 10 10/12/2023 0411   CREATININE 0.67 10/14/2023 0532   CALCIUM 8.1 (L) 10/12/2023 0411   GFRNONAA >60 10/14/2023 0532   GFRAA >60 12/04/2019 0523    INR    Component Value Date/Time   INR 1.1 10/11/2023 1431     Intake/Output Summary (Last 24 hours) at 10/15/2023 1218 Last data filed at 10/15/2023 1111 Gross per 24 hour  Intake --  Output 1925 ml  Net -1925 ml     Assessment/Plan:  62 y.o. male is s/p right above-the-knee amputation.  3 Days Post-Op   PLAN: Vascular surgery to do first dressing change tomorrow 10/16/2023. Discontinue heparin infusion today.  Restart patient's aspirin 81 mg daily, and Eliquis 5 mg twice daily. Advance diet as tolerated. Pain medication as needed. Continue to work with PT OT.  DVT prophylaxis: Heparin infusion to be converted to oral anticoagulants today.   [name] [facility] 10/15/2023 12:18 PM

## 2023-10-15 NOTE — Progress Notes (Signed)
Physical Therapy Treatment Patient Details Name: Brendan Williams MRN: [id number] DOB: [birth year] Today's Date: 10/15/2023   History of Present Illness 62 y.o. male with medical history significant of PAD s/p multiple angiograms and R TMA presenting with foot pain and erythema after recent treatment for cellulitis with Bactrim.  He started with pinkie toe infection with PAD 3 years ago.  He had the toe taken off and eventually had TMA about 4 months ago.  It has never healed.  Vascular surgery has been planning for amputation. 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 He also has sepsis due to LE cellulitis/wound infection.    PT Comments  Patient is agreeable to PT. Bed mobility performed with Min A. Patient able to stand from elevated surface using rolling walker with CGA for safety. Short distance ambulation performed with mild increased residual limb pain reported with mobility. Recommend to continue PT with rehabilitation < 3 hours/day recommended after this hospital stay.    If plan is discharge home, recommend the following: A lot of help with walking and/or transfers;A lot of help with bathing/dressing/bathroom;Assist for transportation;Assistance with cooking/housework;Help with stairs or ramp for entrance   Can travel by private vehicle     No  Equipment Recommendations  Wheelchair cushion (measurements PT);Wheelchair (measurements PT)    Recommendations for Other Services       Precautions / Restrictions Precautions Precautions: Fall Restrictions Weight Bearing Restrictions Per Provider Order: No     Mobility  Bed Mobility Overal bed mobility: Needs Assistance Bed Mobility: Supine to Sit, Sit to Supine     Supine to sit: Min assist Sit to supine: Supervision   General bed mobility comments: assistance for trunk support to sit upright. cues for technique. increased time required with mobility    Transfers Overall transfer level: Needs assistance Equipment  used: Rolling walker (2 wheels) Transfers: Sit to/from Stand Sit to Stand: Contact guard assist, From elevated surface           General transfer comment: bed height elevated. reinforced safety with standing, using rolling walker for safety    Ambulation/Gait Ambulation/Gait assistance: Contact guard assist Gait Distance (Feet): 3 Feet Assistive device: Rolling walker (2 wheels)         General Gait Details: intermittent pivoting on the right leg but is able to take a small hop step   Stairs             Wheelchair Mobility     Tilt Bed    Modified Rankin (Stroke Patients Only)       Balance                                            Cognition Arousal: Alert Behavior During Therapy: WFL for tasks assessed/performed, Anxious Overall Cognitive Status: Within Functional Limits for tasks assessed                                          Exercises      General Comments General comments (skin integrity, edema, etc.): emphasis on residual limb hip extension while standing. patient able to achieve nearly neutral hip positioning but tends to hold leg in flexed position. encouraged techniques to help with phantom limb sensation. frequency adjusted to reflect patient centered delivery of  care model.      Pertinent Vitals/Pain Pain Assessment Pain Assessment: Faces Faces Pain Scale: Hurts little more Pain Location: residual limb Pain Descriptors / Indicators: Discomfort Pain Intervention(s): Monitored during session, Limited activity within patient's tolerance, Premedicated before session, Repositioned    Home Living                          Prior Function            PT Goals (current goals can now be found in the care plan section) Acute Rehab PT Goals Patient Stated Goal: decreased pain, return home PT Goal Formulation: With patient Time For Goal Achievement: 10/27/23 Potential to Achieve Goals:  Fair Progress towards PT goals: Progressing toward goals    Frequency    Min 1X/week      PT Plan      Co-evaluation              AM-PAC PT "6 Clicks" Mobility   Outcome Measure  Help needed turning from your back to your side while in a flat bed without using bedrails?: A Little Help needed moving from lying on your back to sitting on the side of a flat bed without using bedrails?: A Little Help needed moving to and from a bed to a chair (including a wheelchair)?: A Little Help needed standing up from a chair using your arms (e.g., wheelchair or bedside chair)?: A Little Help needed to walk in hospital room?: A Lot Help needed climbing 3-5 steps with a railing? : Total 6 Click Score: 15    End of Session   Activity Tolerance: Patient tolerated treatment well Patient left: in bed;with call bell/phone within reach;with bed alarm set   PT Visit Diagnosis: Difficulty in walking, not elsewhere classified (R26.2);Pain;Unsteadiness on feet (R26.81);Muscle weakness (generalized) (M62.81);Other abnormalities of gait and mobility (R26.89)     Time: 0102-7253 PT Time Calculation (min) (ACUTE ONLY): 24 min  Charges:    $Therapeutic Activity: 23-37 mins PT General Charges $$ ACUTE PT VISIT: 1 Visit                     Donna Bernard, PT, MPT    Ina Homes 10/15/2023, 1:45 PM

## 2023-10-16 DIAGNOSIS — I70221 Atherosclerosis of native arteries of extremities with rest pain, right leg: Secondary | ICD-10-CM | POA: Diagnosis not present

## 2023-10-16 LAB — CULTURE, BLOOD (ROUTINE X 2)
Culture: NO GROWTH
Special Requests: ADEQUATE

## 2023-10-16 LAB — SURGICAL PATHOLOGY

## 2023-10-16 LAB — MRSA NEXT GEN BY PCR, NASAL: MRSA by PCR Next Gen: NOT DETECTED

## 2023-10-16 LAB — LACTIC ACID, PLASMA: Lactic Acid, Venous: 0.8 mmol/L (ref 0.5–1.9)

## 2023-10-16 MED ORDER — SODIUM CHLORIDE 0.9 % IV BOLUS
1000.0000 mL | Freq: Once | INTRAVENOUS | Status: AC
  Administered 2023-10-16: 1000 mL via INTRAVENOUS

## 2023-10-16 MED ORDER — MORPHINE SULFATE (PF) 2 MG/ML IV SOLN
2.0000 mg | INTRAVENOUS | Status: DC | PRN
  Administered 2023-10-16: 2 mg via INTRAVENOUS

## 2023-10-16 MED ORDER — LACTATED RINGERS IV SOLN: INTRAVENOUS | Status: DC

## 2023-10-16 MED ORDER — AMOXICILLIN-POT CLAVULANATE 875-125 MG PO TABS
1.0000 | ORAL_TABLET | Freq: Two times a day (BID) | ORAL | Status: DC
  Administered 2023-10-16 – 2023-10-19 (×7): 1 via ORAL
  Filled 2023-10-16 (×7): qty 1

## 2023-10-16 NOTE — Progress Notes (Signed)
  Progress Note    10/16/2023 11:25 AM 4 Days Post-Op  Subjective:   Brendan Williams is a 62 year old male now status postop day # 4 from right above-the-knee amputation.  Patient is resting comfortably in bed this morning.  He endorses some phantom nerve pain but otherwise states everything is tolerable.  After dressing change today he may transfer to rehab before going home.  We had a long discussion again today about falling with specifically falling on his stump which would require another surgery to fix.  Patient verbalizes understanding and is willing to follow instructions related to ambulation.  No complications overnight.  Vitals all remained stable.    Vitals:   10/16/23 0518 10/16/23 0927  BP: 106/83 (!) 131/90  Pulse: 68 64  Resp: 18 16  Temp: 98.8 F (37.1 C) 98.5 F (36.9 C)  SpO2: 98% 100%   Physical Exam: Cardiac:  RRR, normal S1 and S2, no murmurs rubs or gallops appreciated. Lungs: Clear on auscultation throughout but diminished in the bases.  No rales rhonchi or wheezing noted. Incisions: Right AKA incision First dressing change today completed. No drainage noted today. Extremities: Right above-the-knee amputation with dressing clean dry and intact.  No drainage noted.  Left lower extremity warm to touch with Doppler pulses. Abdomen: Positive bowel sounds throughout, soft, nontender and nondistended. Neurologic: Alert and oriented x 3, answers all questions and follows commands appropriately.  CBC    Component Value Date/Time   WBC 14.0 (H) 10/15/2023 0243   RBC 3.10 (L) 10/15/2023 0243   HGB 9.2 (L) 10/15/2023 0243   HCT 27.6 (L) 10/15/2023 0243   PLT 468 (H) 10/15/2023 0243   MCV 89.0 10/15/2023 0243   MCH 29.7 10/15/2023 0243   MCHC 33.3 10/15/2023 0243   RDW 15.5 10/15/2023 0243   LYMPHSABS 0.7 10/11/2023 1133   MONOABS 0.9 10/11/2023 1133   EOSABS 0.0 10/11/2023 1133   BASOSABS 0.0 10/11/2023 1133    BMET    Component Value Date/Time   NA 138  10/12/2023 0411   K 4.1 10/12/2023 0411   CL 101 10/12/2023 0411   CO2 27 10/12/2023 0411   GLUCOSE 105 (H) 10/12/2023 0411   BUN 10 10/12/2023 0411   CREATININE 0.67 10/14/2023 0532   CALCIUM  8.1 (L) 10/12/2023 0411   GFRNONAA >60 10/14/2023 0532   GFRAA >60 12/04/2019 0523    INR    Component Value Date/Time   INR 1.1 10/11/2023 1431     Intake/Output Summary (Last 24 hours) at 10/16/2023 1125 Last data filed at 10/16/2023 9071 Gross per 24 hour  Intake 2843.48 ml  Output 500 ml  Net 2343.48 ml     Assessment/Plan:  62 y.o. male is s/p right above-the-knee amputation.  4 Days Post-Op   PLAN: Vascular surgery completed first dressing change today 10/16/2023. Healing well, without complication to note.  Continue antibiotics per Hospitalist Continue patient's aspirin  81 mg daily, and Eliquis  5 mg twice daily. Advance diet as tolerated. Pain medication as needed. Continue to work with PT OT.   DVT prophylaxis: ASA 81 mg daily and Eliquis  5 mg daily   Gwendlyn JONELLE [facility]ank [facility] 10/16/2023 11:25 AM

## 2023-10-16 NOTE — Progress Notes (Signed)
 PROGRESS NOTE    JAMAL PAVON  FMW:[id number] DOB: May 01, 1961 DOA: 10/11/2023 PCP: Autry Grayce LABOR, PA    Brief Narrative:  62 y.o. male with medical history significant of PAD s/p multiple angiograms and R TMA presenting with foot pain and erythema after recent treatment for cellulitis with Bactrim .  He started with pinkie toe infection with PAD 3 years ago.  He had the toe taken off and eventually had TMA about 4 months ago.  It has never healed.  Vascular surgery has been planning for amputation.  He has been feeling bad for a while.  No fever, having sweats   Seen in consultation by vascular surgery.  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  Tolerated procedure well.  No immediate postoperative complications.  Vascular did first dressing change on 12/31.  Healing well.  No complication noted.   Assessment & Plan:   Principal Problem:   Critical limb ischemia of right lower extremity (HCC) Active Problems:   Tobacco use disorder   PAD (peripheral artery disease) (HCC)   Sepsis (HCC)   Hyperlipidemia   Neuropathy   Lactic acidosis   Lower extremity cellulitis  Critical limb ischemia, PAD Patient with recent angiogram showing nonreconstructable disease He was recommended for AKA and declined admission He was treated with Bactrim  but has persistent LE infection with mottling of the leg He returned today and now agrees to amputation Status post right AKA 12/27 Plan: Pain control Minimize use of IV narcotic Continue aspirin  and Eliquis  De-escalate to oral antibiotic Medical ready for discharge Would benefit from skilled nursing facility placement PT OT and TOC aware  Sepsis due to LE cellulitis/wound infection SIRS criteria in this patient includes: Leukocytosis, tachycardia  Patient has evidence of acute organ failure with elevated lactate >2 that is not easily explained by another condition. While awaiting blood cultures, this appears to be a preseptic  condition. Sepsis protocol initiated Suspected source is LE wound infection Plan: De-escalate to oral antibiotic  Tobacco dependence Encourage cessation - he reports that he has quit, last cigarette was about 3 days ago Patch declined by patient   Neuropathy Continue gabapentin    HLD Continue atorvastatin       DVT prophylaxis: Heparin  GTT Code Status: Full Family Communication: Spouse at bedside 12/27, 12/29 Disposition Plan: Status is: Inpatient Remains inpatient appropriate because: POD # 5 status post right AKA.  Patient medically ready for discharge at this time.  Would benefit from skilled nursing facility placement.   Level of care: Med-Surg  Consultants:  Vascular surgery  Procedures:  Right AKA 12/27  Antimicrobials: Vancomycin  Ceftriaxone    Subjective: Seen and examined.  Resting in bed.  No distress.  Pain well-controlled.  Objective: Vitals:   10/16/23 0332 10/16/23 0518 10/16/23 0927 10/16/23 1256  BP: 103/70 106/83 (!) 131/90 110/72  Pulse: 61 68 64 61  Resp: 18 18 16 16   Temp: 98.2 F (36.8 C) 98.8 F (37.1 C) 98.5 F (36.9 C) 98.5 F (36.9 C)  TempSrc: Oral  Oral Oral  SpO2: 97% 98% 100% 100%  Weight:      Height:        Intake/Output Summary (Last 24 hours) at 10/16/2023 1413 Last data filed at 10/16/2023 0928 Gross per 24 hour  Intake 2843.48 ml  Output 500 ml  Net 2343.48 ml   Filed Weights   10/11/23 1600 10/13/23 0500  Weight: 79.4 kg 77.2 kg    Examination:  General exam: No acute distress Respiratory system: Lungs  clear normal work of breathing.  Room air Cardiovascular system: S1-S2, RRR, no murmurs, no pedal edema Gastrointestinal system: Soft, NT/ND, normal bowel sounds Central nervous system: Alert and oriented. No focal neurological deficits. Extremities: Right AKA.  Incision CDI Skin: No rashes, lesions or ulcers Psychiatry: Judgement and insight appear normal. Mood & affect appropriate.     Data  Reviewed: I have personally reviewed following labs and imaging studies  CBC: Recent Labs  Lab 10/11/23 1133 10/12/23 0411 10/13/23 0548 10/14/23 0532 10/15/23 0243  WBC 22.2* 24.8* 15.8* 14.1* 14.0*  NEUTROABS 20.4*  --   --   --   --   HGB 12.5* 9.9* 8.3* 9.6* 9.2*  HCT 37.2* 30.3* 25.0* 28.5* 27.6*  MCV 88.4 91.8 90.3 87.7 89.0  PLT 664* 540* 478* 526* 468*   Basic Metabolic Panel: Recent Labs  Lab 10/11/23 1133 10/12/23 0411 10/14/23 0532  NA 134* 138  --   K 4.1 4.1  --   CL 93* 101  --   CO2 25 27  --   GLUCOSE 136* 105*  --   BUN 11 10  --   CREATININE 1.02 0.76 0.67  CALCIUM  8.9 8.1*  --   MG  --  1.9  --    GFR: Estimated Creatinine Clearance: 103.2 mL/min (by C-G formula based on SCr of 0.67 mg/dL). Liver Function Tests: Recent Labs  Lab 10/11/23 1133 10/12/23 0411  AST 22 17  ALT 13 10  ALKPHOS 80 58  BILITOT 0.4 0.4  PROT 7.7 6.1*  ALBUMIN  3.0* 2.3*   No results for input(s): LIPASE, AMYLASE in the last 168 hours. No results for input(s): AMMONIA in the last 168 hours. Coagulation Profile: Recent Labs  Lab 10/11/23 1431  INR 1.1   Cardiac Enzymes: No results for input(s): CKTOTAL, CKMB, CKMBINDEX, TROPONINI in the last 168 hours. BNP (last 3 results) No results for input(s): PROBNP in the last 8760 hours. HbA1C: No results for input(s): HGBA1C in the last 72 hours.  CBG: Recent Labs  Lab 10/11/23 1649 10/11/23 2217 10/12/23 1254  GLUCAP 71 117* 122*   Lipid Profile: No results for input(s): CHOL, HDL, LDLCALC, TRIG, CHOLHDL, LDLDIRECT in the last 72 hours. Thyroid Function Tests: No results for input(s): TSH, T4TOTAL, FREET4, T3FREE, THYROIDAB in the last 72 hours. Anemia Panel: No results for input(s): VITAMINB12, FOLATE, FERRITIN, TIBC, IRON , RETICCTPCT in the last 72 hours. Sepsis Labs: Recent Labs  Lab 10/11/23 1133 10/11/23 1636 10/11/23 2055 10/12/23 0135   PROCALCITON  --  1.59  --   --   LATICACIDVEN 2.9* 5.0* 2.1* 1.2    Recent Results (from the past 240 hours)  Blood Culture (routine x 2)     Status: None (Preliminary result)   Collection Time: 10/11/23  2:20 PM   Specimen: BLOOD  Result Value Ref Range Status   Specimen Description BLOOD LEFT ANTECUBITAL  Final   Special Requests   Final    BOTTLES DRAWN AEROBIC AND ANAEROBIC Blood Culture adequate volume   Culture  Setup Time PENDING  Incomplete   Culture   Final    NO GROWTH 5 DAYS Performed at [facility], [address], [place], [place] [address]    Report Status PENDING  Incomplete  Blood Culture (routine x 2)     Status: None   Collection Time: 10/11/23  2:35 PM   Specimen: BLOOD RIGHT FOREARM  Result Value Ref Range Status   Specimen Description   Final  BLOOD RIGHT FOREARM Performed at [facility], [address], [place], [place] [address]    Special Requests   Final    BOTTLES DRAWN AEROBIC AND ANAEROBIC Blood Culture adequate volume Performed at [facility], [address], [place], [place] [address]    Culture  Setup Time   Final    NO ORGANISMS SEEN Performed at [facility], [address] [place], [place] [address]    Culture   Final    NO GROWTH 5 DAYS Performed at [facility], [address] [place], [place] [address]    Report Status 10/16/2023 FINAL  Final         Radiology Studies: No results found.       Scheduled Meds:  amoxicillin -clavulanate  1 tablet Oral Q12H   apixaban   5 mg Oral BID   vitamin C   500 mg Oral BID   aspirin  EC  81 mg Oral Daily   atorvastatin   40 mg Oral Daily   docusate sodium   100 mg Oral BID   feeding supplement  237 mL Oral TID BM   gabapentin   300 mg Oral BID   multivitamin with minerals  1 tablet Oral Daily   pantoprazole   40 mg Oral Daily   zinc  sulfate (50mg  elemental zinc )  220 mg Oral Daily   Continuous Infusions:     LOS: 5 days      Calvin KATHEE Robson, MD [facility]   If 7PM-7AM, please contact night-coverage  10/16/2023, 2:13 PM

## 2023-10-16 NOTE — TOC Progression Note (Signed)
 Transition of Care [facility]) - Progression Note    Patient Details  Name: Brendan Williams MRN: [id number] Date of Birth: 03/31/1961  Transition of [facility]) CM/SW Contact  Tomasa JAYSON Childes, RN Phone Number: 10/16/2023, 3:17 PM  Clinical Narrative:    Spoke with patient regarding therapy recommendation for SNF. He is agreeable to SNF and does not have a preference of facility other than [facility].   FL2 completed. Bed search started.         Expected Discharge Plan and Services                                               Social Determinants of Health (SDOH) Interventions SDOH Screenings   Food Insecurity: No Food Insecurity (10/12/2023)  Housing: Unknown (10/12/2023)  Transportation Needs: No Transportation Needs (10/12/2023)  Utilities: Not At Risk (10/12/2023)  Tobacco Use: Medium Risk (10/12/2023)    Readmission Risk Interventions     No data to display

## 2023-10-16 NOTE — Progress Notes (Signed)
 PT Cancellation Note  Patient Details Name: Brendan Williams MRN: [id number] DOB: [birth year]   Cancelled Treatment:    Reason Eval/Treat Not Completed: Patient declined, no reason specified (Patient declined, just getting lunch and has been OOB multiple times today. PT will continue with attempts.)  Randine Essex, PT, MPT  Randine LULLA Essex 10/16/2023, 1:11 PM

## 2023-10-16 NOTE — NC FL2 (Signed)
 [place]  MEDICAID FL2 LEVEL OF CARE FORM     IDENTIFICATION  Patient Name: Brendan Williams Birthdate: 01-16-1961 Sex: male Admission Date (Current Location): 10/11/2023  [facility] and [place] Number:  Chiropodist and Address:  [facility], [address] [place], [place] [address]      Provider Number: [id number]  Attending Physician Name and Address:  Jhonny Calvin NOVAK, MD  Relative Name and Phone Number:  ISHMAEL, BERKOVICH (Spouse)  [phone] (Mobile)    Current Level of Care: Hospital Recommended Level of Care: Skilled Nursing Facility Prior Approval Number:    Date Approved/Denied:   PASRR Number: [id number]  Discharge Plan: SNF    Current Diagnoses: Patient Active Problem List   Diagnosis Date Noted   Lower extremity cellulitis 10/12/2023   Lactic acidosis 10/11/2023   Acute lower limb ischemia 09/27/2023   Chronic anticoagulation 09/27/2023   Hyperlipidemia 09/27/2023   Neuropathy 09/27/2023   Elevated ETOH level 09/27/2023   Critical limb ischemia of right lower extremity (HCC) 05/18/2023   Sepsis (HCC) 05/18/2023   Cellulitis and abscess of toe of right foot 05/18/2023   Atherosclerosis of native arteries of extremity with rest pain (HCC) 11/25/2019   PAD (peripheral artery disease) (HCC) 11/25/2019   Tobacco use disorder 11/11/2019   Atherosclerosis of native arteries of extremity with intermittent claudication (HCC) 11/11/2019    Orientation RESPIRATION BLADDER Height & Weight     Self, Time, Situation, Place  Normal Continent Weight: 77.2 kg Height:  6' 1 (185.4 cm)  BEHAVIORAL SYMPTOMS/MOOD NEUROLOGICAL BOWEL NUTRITION STATUS  Other (Comment) (n/a)   Continent Diet  AMBULATORY STATUS COMMUNICATION OF NEEDS Skin   Limited Assist Verbally Other (Comment) (Incision R thigh)                       Personal Care Assistance Level of Assistance  Bathing, Feeding, Dressing Bathing Assistance: Limited  assistance Feeding assistance: Limited assistance Dressing Assistance: Limited assistance     Functional Limitations Info             SPECIAL CARE FACTORS FREQUENCY  PT (By licensed PT), OT (By licensed OT)     PT Frequency: Min 2x weekly OT Frequency: Min 2x weekly            Contractures Contractures Info: Not present    Additional Factors Info  Code Status, Allergies Code Status Info: Full Allergies Info: No Known Allergies           Current Medications (10/16/2023):  This is the current hospital active medication list Current Facility-Administered Medications  Medication Dose Route Frequency Provider Last Rate Last Admin   acetaminophen  (TYLENOL ) tablet 650 mg  650 mg Oral Q6H PRN Patel, Kishan S, RPH       Or   acetaminophen  (TYLENOL ) suppository 650 mg  650 mg Rectal Q6H PRN Patel, Kishan S, RPH       amoxicillin -clavulanate (AUGMENTIN ) 875-125 MG per tablet 1 tablet  1 tablet Oral Q12H Sreenath, Sudheer B, MD   1 tablet at 10/16/23 9072   apixaban  (ELIQUIS ) tablet 5 mg  5 mg Oral BID Sreenath, Sudheer B, MD   5 mg at 10/16/23 9166   ascorbic acid  (VITAMIN C ) tablet 500 mg  500 mg Oral BID Sreenath, Sudheer B, MD   500 mg at 10/16/23 0833   aspirin  EC tablet 81 mg  81 mg Oral Daily Sreenath, Sudheer B, MD   81 mg at 10/16/23 9166   atorvastatin  (  LIPITOR ) tablet 40 mg  40 mg Oral Daily Dew, Jason S, MD   40 mg at 10/16/23 0833   bisacodyl  (DULCOLAX) EC tablet 5 mg  5 mg Oral Daily PRN Dew, Jason S, MD       busPIRone  (BUSPAR ) tablet 5 mg  5 mg Oral TID PRN Dew, Jason S, MD   5 mg at 10/11/23 1653   clonazePAM  (KLONOPIN ) tablet 0.5 mg  0.5 mg Oral TID PRN Sreenath, Sudheer B, MD   0.5 mg at 10/12/23 1310   docusate sodium  (COLACE) capsule 100 mg  100 mg Oral BID Dew, Jason S, MD   100 mg at 10/16/23 9165   feeding supplement (ENSURE ENLIVE / ENSURE PLUS) liquid 237 mL  237 mL Oral TID BM Sreenath, Sudheer B, MD   237 mL at 10/16/23 1347   gabapentin  (NEURONTIN )  capsule 300 mg  300 mg Oral BID Dew, Jason S, MD   300 mg at 10/16/23 9166   hydrALAZINE  (APRESOLINE ) injection 5 mg  5 mg Intravenous Q4H PRN Dew, Jason S, MD       morphine  (PF) 2 MG/ML injection 2 mg  2 mg Intravenous Q4H PRN Sreenath, Sudheer B, MD   2 mg at 10/16/23 1414   multivitamin with minerals tablet 1 tablet  1 tablet Oral Daily Sreenath, Sudheer B, MD   1 tablet at 10/16/23 9166   ondansetron  (ZOFRAN ) tablet 4 mg  4 mg Oral Q6H PRN Dew, Jason S, MD       Or   ondansetron  (ZOFRAN ) injection 4 mg  4 mg Intravenous Q6H PRN Dew, Jason S, MD       oxyCODONE -acetaminophen  (PERCOCET) 7.5-325 MG per tablet 1 tablet  1 tablet Oral Q4H PRN Dew, Jason S, MD   1 tablet at 10/15/23 1819   pantoprazole  (PROTONIX ) EC tablet 40 mg  40 mg Oral Daily Dew, Jason S, MD   40 mg at 10/16/23 9166   polyethylene glycol (MIRALAX  / GLYCOLAX ) packet 17 g  17 g Oral Daily PRN Dew, Jason S, MD       zinc  sulfate (50mg  elemental zinc ) capsule 220 mg  220 mg Oral Daily Sreenath, Sudheer B, MD   220 mg at 10/16/23 9166     Discharge Medications: Please see discharge summary for a list of discharge medications.  Relevant Imaging Results:  Relevant Lab Results:   Additional Information SS#: [id number]  Aicha Clingenpeel C Skylur Fuston, RN

## 2023-10-17 NOTE — Plan of Care (Signed)

## 2023-10-17 NOTE — Hospital Course (Signed)
 62yo with h/o PAD s/p multiple angiograms and R TMA who presented on 12/26 with with critical limb ischemia; 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  He is now awaiting SNF placement.

## 2023-10-17 NOTE — Progress Notes (Signed)
 Progress Note   Patient: Brendan Williams FMW:[id number] DOB: [birth year] DOA: 10/11/2023     6 DOS: the patient was seen and examined on 10/17/2023   Brief hospital course: 62yo with h/o PAD s/p multiple angiograms and R TMA who presented on 12/26 with with critical limb ischemia; 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  He is now awaiting SNF placement.  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 Continue ASA, Eliquis  He is medically stable for discharge and is recommended for SNF rehab; he is considering dc to home instead Ongoing antibiotic treatment with Augmentin  to complete 7 days post-operatively (through 1/3)   Tobacco dependence Encourage cessation - he reports that he has quit, last cigarette was about 3 days PTA Patch declined by patient   Neuropathy Continue gabapentin    HLD Continue atorvastatin    Nutrition Status: Nutrition Problem: Increased nutrient needs Etiology: post-op healing Signs/Symptoms: estimated needs Interventions: Ensure Enlive (each supplement provides 350kcal and 20 grams of protein), MVI        Consultants: Vascular surgery PT Nutrition TOC team  Procedures: R AKA 12/27  Antibiotics: Vancomycin  12/26-31 Ceftriaxone  12/26-31 Cefepime  x 1 dose Cefazolin  x 1 dose Augmentin  12/31-1/3   30 Day Unplanned Readmission Risk Score    Flowsheet Row ED to Hosp-Admission (Current) from 10/11/2023 in [facility]  30 Day Unplanned Readmission Risk Score (%) 20.84 Filed at 10/17/2023 0400       This score is the patient's risk of an unplanned readmission within 30 days of being discharged (0  -100%). The score is based on dignosis, age, lab data, medications, orders, and past utilization.   Low:  0-14.9   Medium: 15-21.9   High: 22-29.9   Extreme: 30 and above           Subjective: Feeling pretty good.  Still having pain but progressing enough that he thinks he may not need SNF rehab and can go home.  He is planning to discuss this with vascular.   Objective: Vitals:   10/17/23 0532 10/17/23 0713  BP: 93/66 94/61  Pulse: 63 (!) 58  Resp: 18 16  Temp: 99.1 F (37.3 C) 98.9 F (37.2 C)  SpO2: 97% 99%    Intake/Output Summary (Last 24 hours) at 10/17/2023 1451 Last data filed at 10/17/2023 1024 Gross per 24 hour  Intake 1474.03 ml  Output 1525 ml  Net -50.97 ml   Filed Weights   10/11/23 1600 10/13/23 0500 10/17/23 0532  Weight: 79.4 kg 77.2 kg 68.5 kg    Exam:  General:  Appears calm, in NAD Eyes:  EOMI, normal lids, iris ENT:  grossly normal hearing, lips & tongue, mmm; poor dentition Neck:  no LAD, masses or thyromegaly Cardiovascular:  RRR. No LE edema.  Respiratory:   CTA bilaterally with no wheezes/rales/rhonchi.  Normal respiratory effort. Abdomen:  soft, NT, ND Skin:  no lesions on limited evaluation Musculoskeletal:  s/p R AKA; stump is healing well with staples intact Psychiatric:  pleasant mood and affect, speech fluent and appropriate, AOx3 Neurologic:  CN 2-12 grossly intact, moves all extremities in coordinated fashion    Data Reviewed: I have reviewed the patient's lab results since admission.  Pertinent  labs for today include:   None     Family Communication: None present  Disposition: Status is: Inpatient Remains inpatient appropriate because: awaiting disposition     Time spent: 35 minutes  Unresulted Labs (From admission, onward)     Start     Ordered   10/18/23 0500  CBC with Differential/Platelet  Tomorrow morning,   R       Question:  Specimen collection method  Answer:  Lab=Lab collect   10/17/23 0806   10/18/23  0500  Basic metabolic panel  Tomorrow morning,   R       Question:  Specimen collection method  Answer:  Lab=Lab collect   10/17/23 9193             Author: Delon Herald, MD 10/17/2023 2:51 PM  For on call review [email or web address].

## 2023-10-17 NOTE — TOC Progression Note (Signed)
 Transition of Care [facility]) - Progression Note    Patient Details  Name: MONTE ZINNI MRN: [id number] Date of Birth: 1961/01/17  [facility] CM/SW Contact  Corean ONEIDA Haddock, RN Phone Number: 10/17/2023, 2:09 PM  Clinical Narrative:      No bed offers in [place] Patient notified.  He is agreeable to extend bed search.  He is hopeful that he will improved prior to discharge and go home.  Patient states if he returns home he has a RW, but will need a [facility]      Expected Discharge Plan and Services                                               Social Determinants of Health (SDOH) Interventions SDOH Screenings   Food Insecurity: No Food Insecurity (10/12/2023)  Housing: Unknown (10/12/2023)  Transportation Needs: No Transportation Needs (10/12/2023)  Utilities: Not At Risk (10/12/2023)  Tobacco Use: Medium Risk (10/12/2023)    Readmission Risk Interventions     No data to display

## 2023-10-17 NOTE — Plan of Care (Signed)
  Problem: Pain Management: Goal: General experience of comfort will improve Outcome: Progressing   Problem: Safety: Goal: Ability to remain free from injury will improve Outcome: Progressing

## 2023-10-18 LAB — CBC WITH DIFFERENTIAL/PLATELET
Abs Immature Granulocytes: 0.09 10*3/uL — ABNORMAL HIGH (ref 0.00–0.07)
Basophils Absolute: 0.1 10*3/uL (ref 0.0–0.1)
Basophils Relative: 1 %
Eosinophils Absolute: 0.1 10*3/uL (ref 0.0–0.5)
Eosinophils Relative: 1 %
HCT: 26.3 % — ABNORMAL LOW (ref 39.0–52.0)
Hemoglobin: 8.9 g/dL — ABNORMAL LOW (ref 13.0–17.0)
Immature Granulocytes: 1 %
Lymphocytes Relative: 21 %
Lymphs Abs: 2.1 10*3/uL (ref 0.7–4.0)
MCH: 29.2 pg (ref 26.0–34.0)
MCHC: 33.8 g/dL (ref 30.0–36.0)
MCV: 86.2 fL (ref 80.0–100.0)
Monocytes Absolute: 1.4 10*3/uL — ABNORMAL HIGH (ref 0.1–1.0)
Monocytes Relative: 14 %
Neutro Abs: 6.1 10*3/uL (ref 1.7–7.7)
Neutrophils Relative %: 62 %
Platelets: 520 10*3/uL — ABNORMAL HIGH (ref 150–400)
RBC: 3.05 MIL/uL — ABNORMAL LOW (ref 4.22–5.81)
RDW: 15.6 % — ABNORMAL HIGH (ref 11.5–15.5)
WBC: 9.9 10*3/uL (ref 4.0–10.5)
nRBC: 0 % (ref 0.0–0.2)

## 2023-10-18 LAB — BASIC METABOLIC PANEL
Anion gap: 9 (ref 5–15)
BUN: 6 mg/dL — ABNORMAL LOW (ref 8–23)
CO2: 23 mmol/L (ref 22–32)
Calcium: 8 mg/dL — ABNORMAL LOW (ref 8.9–10.3)
Chloride: 106 mmol/L (ref 98–111)
Creatinine, Ser: 0.64 mg/dL (ref 0.61–1.24)
GFR, Estimated: >60 mL/min (ref >60)
Glucose, Bld: 117 mg/dL — ABNORMAL HIGH (ref 70–99)
Potassium: 3.6 mmol/L (ref 3.5–5.1)
Sodium: 138 mmol/L (ref 135–145)

## 2023-10-18 LAB — CULTURE, BLOOD (ROUTINE X 2)
Report Status: NO GROWTH
Special Requests: ADEQUATE

## 2023-10-18 MED ORDER — ENSURE ENLIVE PO LIQD
237.0000 mL | Freq: Two times a day (BID) | ORAL | Status: DC
  Administered 2023-10-18 – 2023-10-19 (×2): 237 mL via ORAL

## 2023-10-18 NOTE — TOC Progression Note (Addendum)
 Transition of Care [facility]) - Progression Note    Patient Details  Name: Brendan Williams MRN: [id number] Date of Birth: 06-May-1961  Transition of Care [facility]) CM/SW Contact  Corean ONEIDA Haddock, RN Phone Number: 10/18/2023, 2:57 PM  Clinical Narrative:      Patient now declining SNF and wishes to go home with home health services.  I informed patient that due to his insurance I may not be able to arrange for home health services. He states he does not have a preference of home health agency.  MD and therapy notified   -[facility], [facility], and [facility] unable to accept [name] with [facility] to review  - Referral made to Jon with [facility] for 16 inch WC and BSC to be delivered to room    Expected Discharge Plan and Services                                               Social Determinants of Health (SDOH) Interventions SDOH Screenings   Food Insecurity: No Food Insecurity (10/12/2023)  Housing: Unknown (10/12/2023)  Transportation Needs: No Transportation Needs (10/12/2023)  Utilities: Not At Risk (10/12/2023)  Tobacco Use: Medium Risk (10/12/2023)    Readmission Risk Interventions     No data to display

## 2023-10-18 NOTE — Progress Notes (Signed)
 Physical Therapy Treatment Patient Details Name: Brendan Williams MRN: [id number] DOB: [birth year] Today's Date: 10/18/2023   History of Present Illness 63 y.o. male with medical history significant of PAD s/p multiple angiograms and R TMA presenting with foot pain and erythema after recent treatment for cellulitis with Bactrim .  He started with pinkie toe infection with PAD 3 years ago.  He had the toe taken off and eventually had TMA about 4 months ago.  It has never healed.  Vascular surgery has been planning for amputation. 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 He also has sepsis due to LE cellulitis/wound infection.    PT Comments  Pt is currently transferring self to<>from BSC and chair in room. Refusing STR placement at this time and adamant on returning home. Discussed at length pt's home setting for safe transition home if cleared. Pt will benefit from a 16 Inch w/c with cushion. Pt educated on safety at home, proper positioning of R AKA and monitoring of skin. Pt has supportive family who can help with pt's needs. Will continue to see acutely per POC. Pt declined w/c mobility this date    If plan is discharge home, recommend the following: A lot of help with walking and/or transfers;A lot of help with bathing/dressing/bathroom;Assist for transportation;Assistance with cooking/housework;Help with stairs or ramp for entrance   Can travel by private vehicle     No  Equipment Recommendations  Wheelchair cushion (measurements PT);Wheelchair (measurements PT);Other (comment) (16 Inch w/c)    Recommendations for Other Services       Precautions / Restrictions Precautions Precautions: Fall Restrictions Weight Bearing Restrictions Per Provider Order: Yes Other Position/Activity Restrictions: NWB R LE, New AKA     Mobility  Bed Mobility Overal bed mobility: Needs Assistance Bed Mobility: Supine to Sit, Sit to Supine     Supine to sit: Modified independent  (Device/Increase time) Sit to supine: Modified independent (Device/Increase time)        Transfers Overall transfer level: Modified independent Equipment used: Rolling walker (2 wheels), None Transfers: Sit to/from Stand, Bed to chair/wheelchair/BSC Sit to Stand: Modified independent (Device/Increase time) Stand pivot transfers: Modified independent (Device/Increase time) Step pivot transfers: Modified independent (Device/Increase time)       General transfer comment: Pt has been transferring self to<>from BSC/chair in room without assist.    Ambulation/Gait                   Stairs             Wheelchair Mobility     Tilt Bed    Modified Rankin (Stroke Patients Only)       Balance Overall balance assessment: Needs assistance Sitting-balance support: No upper extremity supported, Feet supported Sitting balance-Leahy Scale: Good Sitting balance - Comments: Patient able to sit at edge of bed with ModI   Standing balance support: Bilateral upper extremity supported, During functional activity, Reliant on assistive device for balance Standing balance-Leahy Scale: Fair Standing balance comment: Patient reliant on RW and min A to stand at edge of bed.                            Cognition Arousal: Alert Behavior During Therapy: WFL for tasks assessed/performed, Anxious Overall Cognitive Status: Within Functional Limits for tasks assessed  General Comments: Pt only taking Tylenol  for pain at this point        Exercises      General Comments General comments (skin integrity, edema, etc.): Pt educated on role of PT, stump care, proper positioning, and benefits of a w/c for home use      Pertinent Vitals/Pain Pain Assessment Pain Assessment: 0-10 Pain Score: 2  Pain Location: residual limb Pain Descriptors / Indicators: Discomfort Pain Intervention(s): Monitored during session    Home  Living                          Prior Function            PT Goals (current goals can now be found in the care plan section) Acute Rehab PT Goals Patient Stated Goal: decreased pain, return home Progress towards PT goals: Progressing toward goals    Frequency    Min 1X/week      PT Plan      Co-evaluation              AM-PAC PT 6 Clicks Mobility   Outcome Measure  Help needed turning from your back to your side while in a flat bed without using bedrails?: A Little Help needed moving from lying on your back to sitting on the side of a flat bed without using bedrails?: A Little Help needed moving to and from a bed to a chair (including a wheelchair)?: A Little Help needed standing up from a chair using your arms (e.g., wheelchair or bedside chair)?: A Little Help needed to walk in hospital room?: A Lot Help needed climbing 3-5 steps with a railing? : Total 6 Click Score: 15    End of Session   Activity Tolerance: Patient tolerated treatment well Patient left: in bed;with call bell/phone within reach Nurse Communication: Mobility status PT Visit Diagnosis: Difficulty in walking, not elsewhere classified (R26.2);Pain;Unsteadiness on feet (R26.81);Muscle weakness (generalized) (M62.81);Other abnormalities of gait and mobility (R26.89) Pain - Right/Left: Right Pain - part of body: Leg     Time: 8969-8957 PT Time Calculation (min) (ACUTE ONLY): 12 min  Charges:    $Therapeutic Activity: 8-22 mins PT General Charges $$ ACUTE PT VISIT: 1 Visit                    Darice Bohr, PTA   Darice JAYSON Bohr 10/18/2023, 2:52 PM

## 2023-10-18 NOTE — Progress Notes (Signed)
 Progress Note   Patient: Brendan Williams FMW:[id number] DOB: 09-04-1961 DOA: 10/11/2023     7 DOS: the patient was seen and examined on 10/18/2023   Brief hospital course: 63yo with h/o PAD s/p multiple angiograms and R TMA who presented on 12/26 with with critical limb ischemia; 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  He is now awaiting SNF placement.  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 Continue ASA, Eliquis  He is medically stable for discharge and is recommended for SNF rehab; he has decided to dc to home instead and will be appropriate for dc on 1/3 Ongoing antibiotic treatment with Augmentin  to complete 7 days post-operatively (through 1/3)   Tobacco dependence Encourage cessation - he reports that he has quit, last cigarette was about 3 days PTA Patch declined by patient   Neuropathy Continue gabapentin    HLD Continue atorvastatin    Nutrition Status: Nutrition Problem: Increased nutrient needs Etiology: post-op healing Signs/Symptoms: estimated needs Interventions: Ensure Enlive (each supplement provides 350kcal and 20 grams of protein), MVI             Consultants: Vascular surgery PT Nutrition TOC team   Procedures: R AKA 12/27   Antibiotics: Vancomycin  12/26-31 Ceftriaxone  12/26-31 Cefepime  x 1 dose Cefazolin  x 1 dose Augmentin  12/31-1/3    30 Day Unplanned Readmission Risk Score    Flowsheet Row ED to Hosp-Admission (Current) from [facility]11/2023 in [facility]  30 Day Unplanned Readmission Risk Score (%) 19.18 Filed at 10/18/2023 0400       This score is the patient's risk of an unplanned  readmission within 30 days of being discharged (0 -100%). The score is based on dignosis, age, lab data, medications, orders, and past utilization.   Low:  0-14.9   Medium: 15-21.9   High: 22-29.9   Extreme: 30 and above           Subjective: Continuing to progress.  He now wants to go home (with a wheelchair and bedside commode)   Objective: Vitals:   10/18/23 0729 10/18/23 1444  BP: (!) 104/53 106/72  Pulse: (!) 57 (!) 51  Resp: 16 16  Temp: 98.7 F (37.1 C) 97.8 F (36.6 C)  SpO2: 96% 100%    Intake/Output Summary (Last 24 hours) at 10/18/2023 1526 Last data filed at 10/17/2023 1629 Gross per 24 hour  Intake 1686.08 ml  Output 200 ml  Net 1486.08 ml   Filed Weights   10/11/23 1600 10/13/23 0500 10/17/23 0532  Weight: 79.4 kg 77.2 kg 68.5 kg    Exam:  General:  Appears calm, in NAD Eyes:  EOMI, normal lids, iris ENT:  grossly normal hearing, lips & tongue, mmm; poor dentition Neck:  no LAD, masses or thyromegaly Cardiovascular:  RRR. No LE edema.  Respiratory:   CTA bilaterally with no wheezes/rales/rhonchi.  Normal respiratory effort. Abdomen:  soft, NT, ND Skin:  no lesions on limited evaluation Musculoskeletal:  s/p R AKA; stump is healing well with staples intact Psychiatric:  pleasant mood and affect, speech fluent and appropriate, AOx3 Neurologic:  CN 2-12 grossly intact, moves all extremities in coordinated fashion  Data Reviewed: I have reviewed the patient's lab results since admission.  Pertinent labs for today include:   Glucose 117 WBC 9.9 Hgb 8.9 Platelets 520     Family Communication: None present  Disposition: Status is: Inpatient Remains inpatient appropriate because: ongoing management, anticipate dc 1/3     Time spent: 35 minutes  Unresulted Labs (From admission, onward)     Start     Ordered   10/19/23 0500  CBC with Differential/Platelet  Tomorrow morning,   R       Question:  Specimen collection method  Answer:  Lab=Lab  collect   10/18/23 1526   10/19/23 0500  Basic metabolic panel  Tomorrow morning,   R       Question:  Specimen collection method  Answer:  Lab=Lab collect   10/18/23 1526             Author: Delon Herald, MD 10/18/2023 3:26 PM  For on call review [email or web address].

## 2023-10-18 NOTE — Progress Notes (Signed)
 Patient is not able to walk the distance required to go the bathroom, or he/she is unable to safely negotiate stairs required to access the bathroom.  A 3in1 BSC will alleviate this problem      Durable Medical Equipment  (From admission, onward)           Start     Ordered   10/18/23 1331  For home use only DME Bedside commode  Once       Question:  Patient needs a bedside commode to treat with the following condition  Answer:  Weakness   10/18/23 1330   10/18/23 1330  For home use only DME lightweight manual wheelchair with seat cushion  Once       Comments: 16 inch   Patient suffers from bka which impairs their ability to perform daily activities like dressing and toileting in the home.  A walker will not resolve  issue with performing activities of daily living. A wheelchair will allow patient to safely perform daily activities. Patient is not able to propel themselves in the home using a standard weight wheelchair due to endurance and general weakness. Patient can self propel in the lightweight wheelchair. Length of need Lifetime. Accessories: elevating leg rests (ELRs), wheel locks, extensions and anti-tippers.   10/18/23 1330

## 2023-10-18 NOTE — Progress Notes (Signed)
 Nutrition Follow-up  DOCUMENTATION CODES:   Not applicable  INTERVENTION:   -Continue regular diet -Decrease Ensure Enlive po to BID, each supplement provides 350 kcal and 20 grams of protein -Continue MVI with minerals daily -Continue 500 mg vitamin C  BID -Continue 220 mg zinc  sulfate daily x 14 days  NUTRITION DIAGNOSIS:   Increased nutrient needs related to post-op healing as evidenced by estimated needs.  Ongoing  GOAL:   Patient will meet greater than or equal to 90% of their needs  Progressing   MONITOR:   PO intake, Supplement acceptance  REASON FOR ASSESSMENT:   Consult Assessment of nutrition requirement/status, Wound healing  ASSESSMENT:   Pt with medical history significant of PAD s/p multiple angiograms and R TMA presented with foot pain and erythema after recent treatment for cellulitis with Bactrim .  He started with pinkie toe infection with PAD 3 years ago.  He had the toe taken off and eventually had TMA about 4 months ago.  It has never healed.  12/27- s/p Right above-the-knee amputation; Right SFA, common femoral, and iliac Fogarty embolectomy  Reviewed I/O's: +1.6 L x 24 hours and +8.3 L since admission  UOP: 575 ml x 24 hours   Pt sitting up in bed at time of visit. Pt pleasant and in good spirits today, talking on phone to family members. Pt with improved oral intake. Noted pt consumed 100% of breakfast and was drinking Ensure during visit. Documented meal completions 75-100%.   Reviewed wt hx; pt has experienced a 9.5% wt loss over the past 3 months, which is significant for time frame. Suspect some wt loss is related to amputation as last recorded wt was taken on 10/17/23.   Per TOC notes, plan for SNF placement vs home health at discharge.   Medications reviewed and include vitamin C , colace, neurontin , protonix , and zinc  sulfate.   Labs reviewed: CBGS: 122 (inpatient orders for glycemic control are none).    Diet Order:   Diet Order              Diet regular Room service appropriate? Yes; Fluid consistency: Thin  Diet effective now                   EDUCATION NEEDS:   Education needs have been addressed  Skin:  Skin Assessment: Skin Integrity Issues: Skin Integrity Issues:: Incisions Incisions: s/p rt AKA  Last BM:  10/17/23  Height:   Ht Readings from Last 1 Encounters:  10/11/23 6' 1 (1.854 m)    Weight:   Wt Readings from Last 1 Encounters:  10/17/23 68.5 kg    Ideal Body Weight:  76.9 kg (adjusted for AKA)  BMI:  Body mass index is 19.92 kg/m.  Estimated Nutritional Needs:   Kcal:  2100-2300  Protein:  105-120 grams  Fluid:  > 2 L    Margery ORN, RD, LDN, CDCES Registered Dietitian III Certified Diabetes Care and Education Specialist If unable to reach this RD, please use RD Inpatient group chat on secure chat between hours of 8am-4 pm daily

## 2023-10-18 NOTE — Plan of Care (Signed)

## 2023-10-19 LAB — CBC WITH DIFFERENTIAL/PLATELET
Abs Immature Granulocytes: 0.11 10*3/uL — ABNORMAL HIGH (ref 0.00–0.07)
Basophils Absolute: 0 10*3/uL (ref 0.0–0.1)
Basophils Relative: 0 %
Eosinophils Absolute: 0 10*3/uL (ref 0.0–0.5)
Eosinophils Relative: 0 %
HCT: 27.4 % — ABNORMAL LOW (ref 39.0–52.0)
Hemoglobin: 9.2 g/dL — ABNORMAL LOW (ref 13.0–17.0)
Immature Granulocytes: 1 %
Lymphocytes Relative: 15 %
Lymphs Abs: 1.7 10*3/uL (ref 0.7–4.0)
MCH: 28.8 pg (ref 26.0–34.0)
MCHC: 33.6 g/dL (ref 30.0–36.0)
MCV: 85.9 fL (ref 80.0–100.0)
Monocytes Absolute: 1.2 10*3/uL — ABNORMAL HIGH (ref 0.1–1.0)
Monocytes Relative: 11 %
Neutro Abs: 8 10*3/uL — ABNORMAL HIGH (ref 1.7–7.7)
Neutrophils Relative %: 73 %
Platelets: 575 10*3/uL — ABNORMAL HIGH (ref 150–400)
RBC: 3.19 MIL/uL — ABNORMAL LOW (ref 4.22–5.81)
RDW: 15.6 % — ABNORMAL HIGH (ref 11.5–15.5)
WBC: 11.2 10*3/uL — ABNORMAL HIGH (ref 4.0–10.5)
nRBC: 0 % (ref 0.0–0.2)

## 2023-10-19 LAB — BASIC METABOLIC PANEL
Anion gap: 10 (ref 5–15)
BUN: 6 mg/dL — ABNORMAL LOW (ref 8–23)
CO2: 23 mmol/L (ref 22–32)
Calcium: 8.3 mg/dL — ABNORMAL LOW (ref 8.9–10.3)
Chloride: 102 mmol/L (ref 98–111)
Creatinine, Ser: 0.66 mg/dL (ref 0.61–1.24)
GFR, Estimated: >60 mL/min (ref >60)
Glucose, Bld: 138 mg/dL — ABNORMAL HIGH (ref 70–99)
Potassium: 4 mmol/L (ref 3.5–5.1)
Sodium: 135 mmol/L (ref 135–145)

## 2023-10-19 MED ORDER — BUSPIRONE HCL 5 MG PO TABS: 5.0000 mg | ORAL_TABLET | Freq: Three times a day (TID) | ORAL | 0 refills | Status: DC | PRN

## 2023-10-19 MED ORDER — AMOXICILLIN-POT CLAVULANATE 875-125 MG PO TABS: 1.0000 | ORAL_TABLET | Freq: Two times a day (BID) | ORAL | 0 refills | Status: AC

## 2023-10-19 NOTE — Plan of Care (Signed)

## 2023-10-19 NOTE — TOC Transition Note (Signed)
 Transition of Care [facility]) - Discharge Note   Patient Details  Name: Brendan Williams MRN: [id number] Date of Birth: 29-Dec-1960  [facility] CM/SW Contact:  Lauraine JAYSON Carpen, LCSW Phone Number: 10/19/2023, 12:00 PM   Clinical Narrative:  Patient has orders to discharge home today. [facility] and [facility] are unable to accept home health referral. CSW notified patient. CSW encouraged him to contact his PCP in a week or two to request that they make a home health referral. DME is in the room. No further concerns. CSW signing off.   Final next level of care: Home/Self Care Barriers to Discharge: Barriers Resolved   Patient Goals and CMS Choice            Discharge Placement                Patient to be transferred to facility by: Daughter and son-in-law   Patient and family notified of of transfer: 10/19/23  Discharge Plan and Services Additional resources added to the After Visit Summary for                                       Social Drivers of Health (SDOH) Interventions SDOH Screenings   Food Insecurity: No Food Insecurity (10/12/2023)  Housing: Unknown (10/12/2023)  Transportation Needs: No Transportation Needs (10/12/2023)  Utilities: Not At Risk (10/12/2023)  Tobacco Use: Medium Risk (10/12/2023)     Readmission Risk Interventions     No data to display

## 2023-10-19 NOTE — Discharge Summary (Signed)
 Physician Discharge Summary   Patient: Brendan Williams MRN: [id number] DOB: [birth year]  Admit date:     10/11/2023  Discharge date: 10/19/23  Discharge Physician: Delon Herald   PCP: Autry Grayce LABOR, PA   Recommendations at discharge:   Complete antibiotics today Home health is ordered You are being referred for lung cancer screening Continue aspirin  81 mg daily and Eliquis ; ok to stop Plavix  for now Follow up with vascular surgery as scheduled for staple removal and recheck Follow up with PA Whitten in 1-2 weeks  Discharge Diagnoses: Principal Problem:   Critical limb ischemia of right lower extremity (HCC) Active Problems:   Tobacco use disorder   PAD (peripheral artery disease) (HCC)   Sepsis (HCC)   Hyperlipidemia   Neuropathy   Lactic acidosis   Lower extremity cellulitis    Hospital Course: 62yo with h/o PAD s/p multiple angiograms and R TMA who presented on 12/26 with with critical limb ischemia; 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  He is now awaiting SNF placement.  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 Continue ASA, Eliquis  He is medically stable for discharge and is recommended for SNF rehab; he has decided to dc to home instead and will be appropriate for dc on 1/3 Ongoing antibiotic treatment with Augmentin  to complete 7 days post-operatively (through 1/3)   Tobacco dependence Encourage cessation - he reports that he has quit, last cigarette was about 3 days PTA Patch declined by patient   Neuropathy Continue gabapentin    HLD Continue atorvastatin    Nutrition Status: Nutrition Problem: Increased nutrient needs Etiology:  post-op healing Signs/Symptoms: estimated needs Interventions: Ensure Enlive (each supplement provides 350kcal and 20 grams of protein), MVI             Consultants: Vascular surgery PT Nutrition TOC team   Procedures: R AKA 12/27   Antibiotics: Vancomycin  12/26-31 Ceftriaxone  12/26-31 Cefepime  x 1 dose Cefazolin  x 1 dose Augmentin  12/31-1/3    Pain control - [place]  Controlled Substance Reporting System database was reviewed. and patient was instructed, not to drive, operate heavy machinery, perform activities at heights, swimming or participation in water activities or provide baby-sitting services while on Pain, Sleep and Anxiety Medications; until their outpatient Physician has advised to do so again. Also recommended to not to take more than prescribed Pain, Sleep and Anxiety Medications.   Disposition: Home Diet recommendation:  Cardiac diet DISCHARGE MEDICATION: Allergies as of 10/19/2023   No Known Allergies      Medication List     STOP taking these medications    clopidogrel  75 MG tablet Commonly known as: PLAVIX    leptospermum manuka honey Pste paste       TAKE these medications    amoxicillin -clavulanate 875-125 MG tablet Commonly known as: AUGMENTIN  Take 1 tablet by mouth every 12 (twelve) hours for 2 doses. What changed: when to take this   apixaban  5 MG Tabs tablet Commonly known as: ELIQUIS  Take 1 tablet (5 mg total) by mouth 2 (two) times daily.   aspirin  EC 81 MG tablet Take 1 tablet (81 mg total) by mouth daily.   atorvastatin  40 MG tablet Commonly known as: Lipitor  Take 1 tablet (40 mg total) by  mouth daily.   busPIRone  5 MG tablet Commonly known as: BUSPAR  Take 1 tablet (5 mg total) by mouth 3 (three) times daily as needed (anxiety).   Dexilant  60 MG capsule Generic drug: dexlansoprazole  Take 60 mg by mouth as needed.   gabapentin  300 MG capsule Commonly known as: NEURONTIN  Take 1 capsule (300 mg total) by mouth 2  (two) times daily.   ondansetron  4 MG tablet Commonly known as: Zofran  Take 1 tablet (4 mg total) by mouth every 8 (eight) hours as needed for nausea or vomiting.   oxyCODONE -acetaminophen  7.5-325 MG tablet Commonly known as: Percocet Take 1 tablet by mouth every 4 (four) hours as needed for severe pain (pain score 7-10).               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  A walker will not resolve  issue with performing activities of daily living. A wheelchair will allow patient to safely perform daily activities. Patient is not able to propel themselves in the home using a standard weight wheelchair due to endurance and general weakness. Patient can self propel in the lightweight wheelchair. Length of need Lifetime. Accessories: elevating leg rests (ELRs), wheel locks, extensions and anti-tippers.   10/18/23 1330            Discharge Exam:   Subjective: Feeling good and eager to get home.   Objective: Vitals:   10/19/23 0325 10/19/23 0721  BP: 121/76 116/64  Pulse: 60 63  Resp: 18 16  Temp: 98.2 F (36.8 C) 98 F (36.7 C)  SpO2: 100% 100%    Intake/Output Summary (Last 24 hours) at 10/19/2023 1133 Last data filed at 10/19/2023 0457 Gross per 24 hour  Intake --  Output 500 ml  Net -500 ml   Filed Weights   10/13/23 0500 10/17/23 0532 10/19/23 0324  Weight: 77.2 kg 68.5 kg 67.7 kg    Exam:  General:  Appears calm, in NAD Eyes:  EOMI, normal lids, iris ENT:  grossly normal hearing, lips  & tongue, mmm; poor dentition Neck:  no LAD, masses or thyromegaly Cardiovascular:  RRR. No LE edema.  Respiratory:   CTA bilaterally with no wheezes/rales/rhonchi.  Normal respiratory effort. Abdomen:  soft, NT, ND Skin:  no lesions on limited evaluation Musculoskeletal:  s/p R AKA; stump covered today Psychiatric:  pleasant mood and affect, speech fluent and appropriate, AOx3 Neurologic:  CN 2-12 grossly intact, moves all extremities in coordinated fashion  Data Reviewed: I have reviewed the patient's lab results since admission.  Pertinent labs for today include:  Glucose 138 WBC 11.2 Hgb 9.2 Platelets 575     Condition at discharge: improving  The results of significant diagnostics from this hospitalization (including imaging, microbiology, ancillary and laboratory) are listed below for reference.   Imaging Studies: CT Angio Aortobifemoral W and/or Wo Contrast Result Date: 10/11/2023 CLINICAL DATA:  Right  lower extremity cellulitis EXAM: CT ANGIOGRAPHY OF ABDOMINAL AORTA WITH ILIOFEMORAL RUNOFF TECHNIQUE: Multidetector CT imaging of the abdomen, pelvis and lower extremities was performed using the standard protocol during bolus administration of intravenous contrast. Multiplanar CT image reconstructions and MIPs were obtained to evaluate the vascular anatomy. RADIATION DOSE REDUCTION: This exam was performed according to the departmental dose-optimization program which includes automated exposure control, adjustment of the mA and/or kV according to patient size and/or use of iterative reconstruction technique. CONTRAST:  OMNIPAQUE  IOHEXOL  350 MG/ML SOLN COMPARISON:  None Available. FINDINGS: VASCULAR Aorta: Extensive atherosclerotic plaque. No evidence of aneurysm, dissection or significant stenosis. Celiac: Variant anatomy. The splenic artery and left gastric artery share a common trunk that arises directly from the aorta. Just below this, the common hepatic artery arises  independently from the aorta. No significant plaque, stenosis or occlusion. No changes of fibromuscular dysplasia. SMA: Patent without evidence of aneurysm, dissection, vasculitis or significant stenosis. Renals: Solitary renal arteries bilaterally. Scattered atherosclerotic plaque without significant stenosis or occlusion. No aneurysm, dissection or changes of fibromuscular dysplasia. IMA: Patent without evidence of aneurysm, dissection, vasculitis or significant stenosis. RIGHT Lower Extremity Inflow: Complete occlusion of right-sided kissing iliac stent. The origin of the internal iliac artery is occluded. The entire external iliac artery is occluded. Outflow: The common femoral artery is completely occluded. Profunda femoral branches are patent via collateral flow. Multiple stents are present in the superficial femoral artery extending through the P2 segment of the popliteal artery. The entire system is thrombosed. Runoff: No contrast opacification within the runoff arteries. LEFT Lower Extremity Inflow: Patent kissing iliac stent. Chronic occlusion of the origin of the internal iliac artery. The stent system extends through the external iliac artery and remains patent. Outflow: Patent common femoral artery with evidence of prior endarterectomy and patch angioplasty. The profunda femoral artery is widely patent. The superficial femoral artery occludes just beyond the origin and remains occluded throughout its length. The popliteal artery is patent. Runoff: Chronic occlusion of the anterior tibial artery. Two vessel runoff to the ankle from the posterior tibial and peroneal arteries. Veins: No focal venous abnormality. Review of the MIP images confirms the above findings. NON-VASCULAR Lower chest: Multifocal patchy airspace opacities throughout the visualized portions of the right middle and right lower lobe concerning for multi lobar pneumonia. The visualized lingula and left lower lobe are clear. Hepatobiliary:  The heart is normal in size. No pericardial effusion. The distal esophagus is unremarkable. Pancreas: Unremarkable. No pancreatic ductal dilatation or surrounding inflammatory changes. Spleen: Normal in size without focal abnormality. Adrenals/Urinary Tract: Adrenal glands are unremarkable. Kidneys are normal, without renal calculi, focal lesion, or hydronephrosis. Bladder is unremarkable. Stomach/Bowel: Significant colonic diverticulosis without evidence of active diverticulitis. No evidence of obstruction. Lymphatic: No suspicious lymphadenopathy. Reproductive: Prostate is unremarkable. Other: No abdominal wall hernia or abnormality. No abdominopelvic ascites. Musculoskeletal: Surgical changes of prior right transmetatarsal amputation. Extensive subcutaneous edema of the right lower extremity beginning in the upper calf and extending through the remainder of the limb. No acute osseous abnormality. IMPRESSION: VASCULAR 1. Severe right lower extremity peripheral arterial disease with apparent complete occlusion of the all arteries including the common iliac, external iliac, internal iliac, common femoral, superficial femoral, popliteal and infrageniculate arteries. The profunda femoral arteries are patent via collateral flow. All previously placed stents in the common iliac, superficial femoral and popliteal artery are occluded. 2. Widely patent stents on the left without significant disease in the iliac or femoropopliteal segments. There is chronic  occlusion of the left anterior tibial artery. 3.  Aortic Atherosclerosis (ICD10-I70.0). NON-VASCULAR 1. Probable multilobar pneumonia affecting the right middle and right lower lobes. 2. Significant colonic diverticulosis without evidence of active diverticulitis. 3. Surgical changes of prior right transmetatarsal amputation. 4. Extensive subcutaneous edema of the right lower extremity beginning in the upper calf and extending throughout the remainder of the limb  consistent with the clinical history of cellulitis. Electronically Signed   By: Wilkie Lent M.D.   On: 10/11/2023 16:26   PERIPHERAL VASCULAR CATHETERIZATION Result Date: 09/28/2023 See surgical note for result.  VAS US  ABI WITH/WO TBI Result Date: 09/27/2023  LOWER EXTREMITY DOPPLER STUDY Patient Name:  MANNING LUNA  Date of Exam:   09/27/2023 Medical Rec #: [id number]         Accession #:    [id number] Date of Birth: [birth year]          Patient Gender: M Patient Age:   45 years Exam Location:  [facility] Procedure:      VAS US  ABI WITH/WO TBI Referring Phys: SELINDA GU --------------------------------------------------------------------------------  Indications: Claudication, rest pain, peripheral artery disease, and Severe pain              right leg. High Risk Factors: Past history of smoking. Other Factors: Right trans metatarsal amputation.  Vascular Interventions: 05/28/2023: right SFA atherectomy, stent, ATA PTA                          11/14/19: Bilateral CIA and EIA Stents.                          11/28/2019: Aorta, Bilateral CIA, Bilateral EIA and                         Bilateral SFA Stents with Right ATA Angioplasty.                         01/2022 rt thrombectomy and new stents sfa pop. Performing Technologist: Donnice Charnley RVT  Examination Guidelines: A complete evaluation includes at minimum, Doppler waveform signals and systolic blood pressure reading at the level of bilateral brachial, anterior tibial, and posterior tibial arteries, when vessel segments are accessible. Bilateral testing is considered an integral part of a complete examination. Photoelectric Plethysmograph (PPG) waveforms and toe systolic pressure readings are included as required and additional duplex testing as needed. Limited examinations for reoccurring indications may be performed as noted.  ABI Findings: +---------+------------------+-----+--------+----------+ Right    Rt Pressure  (mmHg)IndexWaveformComment    +---------+------------------+-----+--------+----------+ Brachial 88                                        +---------+------------------+-----+--------+----------+ PTA      0                 0.00 absent             +---------+------------------+-----+--------+----------+ PERO     0                 0.00 absent             +---------+------------------+-----+--------+----------+ DP       0  0.00 absent             +---------+------------------+-----+--------+----------+ Great Toe                               amputation +---------+------------------+-----+--------+----------+ +---------+------------------+-----+----------+-------+ Left     Lt Pressure (mmHg)IndexWaveform  Comment +---------+------------------+-----+----------+-------+ Brachial 97                                       +---------+------------------+-----+----------+-------+ PTA      81                0.84 monophasic        +---------+------------------+-----+----------+-------+ DP       78                0.80 monophasic        +---------+------------------+-----+----------+-------+ Great Toe28                0.29                   +---------+------------------+-----+----------+-------+ +-------+-----------+-----------+------------+------------+ ABI/TBIToday's ABIToday's TBIPrevious ABIPrevious TBI +-------+-----------+-----------+------------+------------+ Right  0.0        amputation 1.12        1.09         +-------+-----------+-----------+------------+------------+ Left   0.84       0.29       0.90        0.60         +-------+-----------+-----------+------------+------------+ No flow seen in right anterior tibial, posterior tibial, or peroneal arteries by duplex. Limited imaging showed right SFA and popliteal artery occlusion. Right ABIs appear decreased compared to prior study on 07/25/2022. Left ABIs appear essentially  unchanged compared to prior study on 07/25/2022. Limited imaging showed occluded right SFA and popliteal arteries.  Summary: Right: Resting right ankle-brachial index indicates critical limb ischemia. Left: Resting left ankle-brachial index indicates mild left lower extremity arterial disease. The left toe-brachial index is abnormal. *See table(s) above for measurements and observations.  Electronically signed by Cordella Shawl MD on 09/27/2023 at 4:45:04 PM.    Final     Microbiology: Results for orders placed or performed during the hospital encounter of 10/11/23  Blood Culture (routine x 2)     Status: None   Collection Time: 10/11/23  2:20 PM   Specimen: BLOOD  Result Value Ref Range Status   Specimen Description BLOOD LEFT ANTECUBITAL  Final   Special Requests   Final    BOTTLES DRAWN AEROBIC AND ANAEROBIC Blood Culture adequate volume   Culture   Final    NO GROWTH 5 DAYS Performed at [facility], [address] [place], [place] [address]    Report Status 10/18/2023 FINAL  Final  Blood Culture (routine x 2)     Status: None   Collection Time: 10/11/23  2:35 PM   Specimen: BLOOD RIGHT FOREARM  Result Value Ref Range Status   Specimen Description   Final    BLOOD RIGHT FOREARM Performed at [facility], [address] [place], [place] [address]    Special Requests   Final    BOTTLES DRAWN AEROBIC AND ANAEROBIC Blood Culture adequate volume Performed at [facility], [address] [place], [place] [address]    Culture  Setup Time   Final    NO ORGANISMS SEEN Performed at [facility], [address] [place], [place] [address]    Culture   Final    NO GROWTH 5 DAYS Performed at [facility], [address] [place], [place] [address]    Report Status 10/16/2023 FINAL  Final  MRSA Next Gen by PCR, Nasal     Status: None   Collection Time: 10/16/23 12:15 PM   Specimen: Nasal Mucosa; Nasal Swab  Result Value Ref Range Status    MRSA by PCR Next Gen NOT DETECTED NOT DETECTED Final    Comment: (NOTE) The GeneXpert MRSA Assay (FDA approved for NASAL specimens only), is one component of a comprehensive MRSA colonization surveillance program. It is not intended to diagnose MRSA infection nor to guide or monitor treatment for MRSA infections. Test performance is not FDA approved in patients less than 58 years old. Performed at [facility], [address] [place], [place] [address]     Labs: CBC: Recent Labs  Lab 10/13/23 [id number] 10/14/23 0532 10/15/23 0243 10/18/23 0454 10/19/23 0520  WBC 15.8* 14.1* 14.0* 9.9 11.2*  NEUTROABS  --   --   --  6.1 8.0*  HGB 8.3* 9.6* 9.2* 8.9* 9.2*  HCT 25.0* 28.5* 27.6* 26.3* 27.4*  MCV 90.3 87.7 89.0 86.2 85.9  PLT 478* 526* 468* 520* 575*   Basic Metabolic Panel: Recent Labs  Lab 10/14/23 0532 10/18/23 0454 10/19/23 0520  NA  --  138 135  K  --  3.6 4.0  CL  --  106 102  CO2  --  23 23  GLUCOSE  --  117* 138*  BUN  --  6* 6*  CREATININE 0.67 0.64 0.66  CALCIUM   --  8.0* 8.3*   Liver Function Tests: No results for input(s): AST, ALT, ALKPHOS, BILITOT, PROT, ALBUMIN  in the last 168 hours. CBG: Recent Labs  Lab 10/12/23 1254  GLUCAP 122*    Discharge time spent: greater than 30 minutes.  Signed: Delon Herald, MD [facility] 10/19/2023

## 2023-10-22 ENCOUNTER — Other Ambulatory Visit (INDEPENDENT_AMBULATORY_CARE_PROVIDER_SITE_OTHER): Payer: Self-pay | Admitting: Nurse Practitioner

## 2023-10-22 ENCOUNTER — Telehealth (INDEPENDENT_AMBULATORY_CARE_PROVIDER_SITE_OTHER): Payer: Self-pay

## 2023-10-22 MED ORDER — OXYCODONE-ACETAMINOPHEN 7.5-325 MG PO TABS: 1.0000 | ORAL_TABLET | ORAL | 0 refills | Status: DC | PRN

## 2023-10-22 NOTE — Telephone Encounter (Signed)
 Patient notified

## 2023-10-22 NOTE — Telephone Encounter (Signed)
 Refill sent.

## 2023-10-22 NOTE — Telephone Encounter (Signed)
 Patient daughter left a message requesting for pain medicine for her father. 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 Please Advise

## 2023-10-30 ENCOUNTER — Ambulatory Visit (INDEPENDENT_AMBULATORY_CARE_PROVIDER_SITE_OTHER): Payer: Medicaid Other | Admitting: Nurse Practitioner

## 2023-11-01 ENCOUNTER — Other Ambulatory Visit (INDEPENDENT_AMBULATORY_CARE_PROVIDER_SITE_OTHER): Payer: Self-pay | Admitting: Nurse Practitioner

## 2023-11-01 ENCOUNTER — Telehealth (INDEPENDENT_AMBULATORY_CARE_PROVIDER_SITE_OTHER): Payer: Self-pay

## 2023-11-01 MED ORDER — OXYCODONE-ACETAMINOPHEN 7.5-325 MG PO TABS: 1.0000 | ORAL_TABLET | ORAL | 0 refills | Status: DC | PRN

## 2023-11-01 NOTE — Telephone Encounter (Signed)
I have sent in a refill, however the patient no showed an appointment on 10/30/2023.  He will need a follow up appointment for a wound check.  Typically it is our policy that we don't send pain medication refills if you no show your appointments, therefore he will not receive a refill until he has followed up

## 2023-11-01 NOTE — Telephone Encounter (Signed)
Patient daughter left a message requesting for pain medication refill for her father.Please Advise

## 2023-11-01 NOTE — Telephone Encounter (Addendum)
Patient was notified that refill has been sent to pharmacy. Patient missed appointment do to being sick. Patient will like to reschedule. Please contact patient [phone]

## 2023-11-20 ENCOUNTER — Ambulatory Visit (INDEPENDENT_AMBULATORY_CARE_PROVIDER_SITE_OTHER): Payer: Medicaid Other | Admitting: Nurse Practitioner

## 2023-11-20 ENCOUNTER — Other Ambulatory Visit (INDEPENDENT_AMBULATORY_CARE_PROVIDER_SITE_OTHER): Payer: Self-pay | Admitting: Nurse Practitioner

## 2023-11-20 ENCOUNTER — Telehealth (INDEPENDENT_AMBULATORY_CARE_PROVIDER_SITE_OTHER): Payer: Self-pay | Admitting: Vascular Surgery

## 2023-11-20 ENCOUNTER — Encounter (INDEPENDENT_AMBULATORY_CARE_PROVIDER_SITE_OTHER): Payer: Self-pay | Admitting: Nurse Practitioner

## 2023-11-20 VITALS — BP 118/78 | HR 64 | Resp 16

## 2023-11-20 DIAGNOSIS — Z89611 Acquired absence of right leg above knee: Secondary | ICD-10-CM

## 2023-11-21 ENCOUNTER — Telehealth (INDEPENDENT_AMBULATORY_CARE_PROVIDER_SITE_OTHER): Payer: Self-pay | Admitting: Nurse Practitioner

## 2023-11-21 ENCOUNTER — Encounter (INDEPENDENT_AMBULATORY_CARE_PROVIDER_SITE_OTHER): Payer: Self-pay | Admitting: Nurse Practitioner

## 2023-11-21 MED ORDER — SULFAMETHOXAZOLE-TRIMETHOPRIM 800-160 MG PO TABS: 1.0000 | ORAL_TABLET | Freq: Two times a day (BID) | ORAL | 0 refills | Status: DC

## 2023-11-21 MED ORDER — OXYCODONE-ACETAMINOPHEN 7.5-325 MG PO TABS: 1.0000 | ORAL_TABLET | ORAL | 0 refills | Status: DC | PRN

## 2023-11-21 NOTE — Progress Notes (Signed)
 Subjective:    Patient ID: Brendan Williams, male    DOB: [birth year], 63 y.o.   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  Today he has some redness along the incision line with a slight opening.  There is some scant drainage.  He notes that he recently fell and these are when the changes occurred.    Review of Systems  Skin:  Positive for wound.  Neurological:  Positive for weakness.  All other systems reviewed and are negative.      Objective:   Physical Exam Vitals reviewed.  HENT:     Head: Normocephalic.  Cardiovascular:     Rate and Rhythm: Normal rate.  Pulmonary:     Effort: Pulmonary effort is normal.  Musculoskeletal:     Right Lower Extremity: Right leg is amputated above knee.  Skin:    General: Skin is warm and dry.  Neurological:     Mental Status: He is alert and oriented to person, place, and time.     Motor: Weakness present.     Gait: Gait abnormal.  Psychiatric:        Mood and Affect: Mood normal.        Behavior: Behavior normal.        Thought Content: Thought content normal.     BP 118/78   Pulse 64   Resp 16   Past Medical History:  Diagnosis Date   Anxiety    situational   Asthma    as a child   Atherosclerosis of native arteries of extremity with intermittent claudication (HCC)    Cellulitis and abscess of toe of right foot    Critical limb ischemia of right lower extremity (HCC)    Current smoker    Quit 04/16/23   Peripheral arterial disease (HCC)     Social History   Socioeconomic History   Marital status: Married    Spouse name: Stefanik,BRENDA (Spouse)   Number of children: Not on file   Years of education: Not on file   Highest education level: Not on file  Occupational History   Occupation: gutter work  Tobacco Use   Smoking status: Former    Current  packs/day: 0.00    Average packs/day: 1 pack/day for 40.5 years (40.5 ttl pk-yrs)    Types: Cigarettes    Start date: 16    Quit date: 04/16/2023    Years since quitting: 0.6   Smokeless tobacco: Never   Tobacco comments:    Last smoked 3-4 days ago  Vaping Use   Vaping status: Never Used  Substance and Sexual Activity   Alcohol use: Yes    Alcohol/week: 5.0 standard drinks of alcohol    Types: 5 Cans of beer per week    Comment: 4-5 beers a week   Drug use: Yes    Frequency: 7.0 times per week    Types: Marijuana    Comment: a joint daily   Sexual activity: Yes  Other Topics Concern   Not on file  Social History Narrative   Live in private residence with wife, Erminio. Had grandson and step daughter in the home   Social Drivers of Health   Financial Resource Strain: Not on file  Food Insecurity: No Food Insecurity (10/12/2023)   Hunger Vital Sign  Worried About Programme Researcher, Broadcasting/film/video in the Last Year: Never true    Ran Out of Food in the Last Year: Never true  Transportation Needs: No Transportation Needs (10/12/2023)   PRAPARE - Administrator, Civil Service (Medical): No    Lack of Transportation (Non-Medical): No  Physical Activity: Not on file  Stress: Not on file  Social Connections: Not on file  Intimate Partner Violence: Not At Risk (10/12/2023)   Humiliation, Afraid, Rape, and Kick questionnaire    Fear of Current or Ex-Partner: No    Emotionally Abused: No    Physically Abused: No    Sexually Abused: No    Past Surgical History:  Procedure Laterality Date   AMPUTATION Right 10/12/2023   Procedure: AMPUTATION ABOVE KNEE;  Surgeon: Marea Selinda RAMAN, MD;  Location: [facility];  Service: General;  Laterality: Right;   AMPUTATION TOE Right 12/02/2019   Procedure: AMPUTATION RIGHT 5TH TOE;  Surgeon: Lennie Barter, DPM;  Location: [facility];  Service: Podiatry;  Laterality: Right;   APPLICATION OF WOUND VAC Bilateral 11/28/2019   Procedure: APPLICATION OF  WOUND VAC;  Surgeon: Marea Selinda RAMAN, MD;  Location: [facility];  Service: Vascular;  Laterality: Bilateral;  Prevena    BACK SURGERY     lower ruptured disk   CENTRAL VENOUS CATHETER INSERTION  11/28/2019   Procedure: INSERTION CENTRAL LINE ADULT;  Surgeon: Marea Selinda RAMAN, MD;  Location: [facility];  Service: Vascular;;   ENDARTERECTOMY FEMORAL Bilateral 11/28/2019   Procedure: ENDARTERECTOMY FEMORAL;  Surgeon: Marea Selinda RAMAN, MD;  Location: [facility];  Service: Vascular;  Laterality: Bilateral;   ENDARTERECTOMY FEMORAL Right 05/23/2023   Procedure: ENDARTERECTOMY FEMORAL REDO;  Surgeon: Marea Selinda RAMAN, MD;  Location: [facility];  Service: Vascular;  Laterality: Right;   ENDOVASCULAR REPAIR/STENT GRAFT Right 05/23/2023   Procedure: ENDOVASCULAR REPAIR/STENT GRAFT;  Surgeon: Marea Selinda RAMAN, MD;  Location: [facility];  Service: Vascular;  Laterality: Right;  Right SFA Stents   LOWER EXTREMITY ANGIOGRAPHY Right 11/14/2019   Procedure: LOWER EXTREMITY ANGIOGRAPHY;  Surgeon: Marea Selinda RAMAN, MD;  Location: [facility];  Service: Cardiovascular;  Laterality: Right;   LOWER EXTREMITY ANGIOGRAPHY Right 12/19/2021   Procedure: Lower Extremity Angiography;  Surgeon: Marea Selinda RAMAN, MD;  Location: [facility];  Service: Cardiovascular;  Laterality: Right;   LOWER EXTREMITY ANGIOGRAPHY Right 01/30/2022   Procedure: Lower Extremity Angiography;  Surgeon: Marea Selinda RAMAN, MD;  Location: [facility];  Service: Cardiovascular;  Laterality: Right;   LOWER EXTREMITY ANGIOGRAPHY Right 05/21/2023   Procedure: Lower Extremity Angiography;  Surgeon: Marea Selinda RAMAN, MD;  Location: [facility];  Service: Cardiovascular;  Laterality: Right;   LOWER EXTREMITY ANGIOGRAPHY Right 09/28/2023   Procedure: Lower Extremity Angiography;  Surgeon: Marea Selinda RAMAN, MD;  Location: [facility];  Service: Cardiovascular;  Laterality: Right;   MECHANICAL THROMBECTOMY WITH AORTOGRAM AND INTERVENTION  05/23/2023    Procedure: MECHANICAL THROMBECTOMY right popliteal artery;  Surgeon: Marea Selinda RAMAN, MD;  Location: [facility];  Service: Vascular;;   STOMACH SURGERY     exploratory   TONSILECTOMY, ADENOIDECTOMY, BILATERAL MYRINGOTOMY AND TUBES     TONSILLECTOMY     TRANSMETATARSAL AMPUTATION Right 06/21/2023   Procedure: TRANSMETATARSAL AMPUTATION;  Surgeon: Lennie Barter, DPM;  Location: [facility];  Service: Orthopedics/Podiatry;  Laterality: Right;    Family History  Problem Relation Age of Onset   Leukemia Mother    Heart disease Father  a. CABG   Heart disease Sister        a. MVR   Cancer Sister    Cancer Brother    CAD Brother        a. CABG    No Known Allergies     Latest Ref Rng & Units 10/19/2023    5:20 AM 10/18/2023    4:54 AM 10/15/2023    2:43 AM  CBC  WBC 4.0 - 10.5 K/uL 11.2  9.9  14.0   Hemoglobin 13.0 - 17.0 g/dL 9.2  8.9  9.2   Hematocrit 39.0 - 52.0 % 27.4  26.3  27.6   Platelets 150 - 400 K/uL 575  520  468       CMP     Component Value Date/Time   NA 135 10/19/2023 0520   K 4.0 10/19/2023 0520   CL 102 10/19/2023 0520   CO2 23 10/19/2023 0520   GLUCOSE 138 (H) 10/19/2023 0520   BUN 6 (L) 10/19/2023 0520   CREATININE 0.66 10/19/2023 0520   CALCIUM  8.3 (L) 10/19/2023 0520   PROT 6.1 (L) 10/12/2023 0411   ALBUMIN  2.3 (L) 10/12/2023 0411   AST 17 10/12/2023 0411   ALT 10 10/12/2023 0411   ALKPHOS 58 10/12/2023 0411   BILITOT 0.4 10/12/2023 0411   GFRNONAA >60 10/19/2023 0520     VAS US  ABI WITH/WO TBI Result Date: 09/27/2023  LOWER EXTREMITY DOPPLER STUDY Patient Name:  BENJIE RICKETSON  Date of Exam:   09/27/2023 Medical Rec #: [id number]         Accession #:    [id number] Date of Birth: 10/31/1960          Patient Gender: M Patient Age:   89 years Exam Location:  [facility] Procedure:      VAS US  ABI WITH/WO TBI Referring Phys: SELINDA DEW --------------------------------------------------------------------------------  Indications:  Claudication, rest pain, peripheral artery disease, and Severe pain              right leg. High Risk Factors: Past history of smoking. Other Factors: Right trans metatarsal amputation.  Vascular Interventions: 05/28/2023: right SFA atherectomy, stent, ATA PTA                          11/14/19: Bilateral CIA and EIA Stents.                          11/28/2019: Aorta, Bilateral CIA, Bilateral EIA and                         Bilateral SFA Stents with Right ATA Angioplasty.                         01/2022 rt thrombectomy and new stents sfa pop. Performing Technologist: Donnice Charnley RVT  Examination Guidelines: A complete evaluation includes at minimum, Doppler waveform signals and systolic blood pressure reading at the level of bilateral brachial, anterior tibial, and posterior tibial arteries, when vessel segments are accessible. Bilateral testing is considered an integral part of a complete examination. Photoelectric Plethysmograph (PPG) waveforms and toe systolic pressure readings are included as required and additional duplex testing as needed. Limited examinations for reoccurring indications may be performed as noted.  ABI Findings: +---------+------------------+-----+--------+----------+ Right    Rt Pressure (mmHg)IndexWaveformComment    +---------+------------------+-----+--------+----------+ Brachial 88                                        +---------+------------------+-----+--------+----------+  PTA      0                 0.00 absent             +---------+------------------+-----+--------+----------+ PERO     0                 0.00 absent             +---------+------------------+-----+--------+----------+ DP       0                 0.00 absent             +---------+------------------+-----+--------+----------+ Great Toe                               amputation +---------+------------------+-----+--------+----------+  +---------+------------------+-----+----------+-------+ Left     Lt Pressure (mmHg)IndexWaveform  Comment +---------+------------------+-----+----------+-------+ Brachial 97                                       +---------+------------------+-----+----------+-------+ PTA      81                0.84 monophasic        +---------+------------------+-----+----------+-------+ DP       78                0.80 monophasic        +---------+------------------+-----+----------+-------+ Great Toe28                0.29                   +---------+------------------+-----+----------+-------+ +-------+-----------+-----------+------------+------------+ ABI/TBIToday's ABIToday's TBIPrevious ABIPrevious TBI +-------+-----------+-----------+------------+------------+ Right  0.0        amputation 1.12        1.09         +-------+-----------+-----------+------------+------------+ Left   0.84       0.29       0.90        0.60         +-------+-----------+-----------+------------+------------+ No flow seen in right anterior tibial, posterior tibial, or peroneal arteries by duplex. Limited imaging showed right SFA and popliteal artery occlusion. Right ABIs appear decreased compared to prior study on 07/25/2022. Left ABIs appear essentially unchanged compared to prior study on 07/25/2022. Limited imaging showed occluded right SFA and popliteal arteries.  Summary: Right: Resting right ankle-brachial index indicates critical limb ischemia. Left: Resting left ankle-brachial index indicates mild left lower extremity arterial disease. The left toe-brachial index is abnormal. *See table(s) above for measurements and observations.  Electronically signed by Cordella Shawl MD on 09/27/2023 at 4:45:04 PM.    Final        Assessment & Plan:   1. S/P AKA (above knee amputation), right (HCC) (Primary) Have the patient staples removed today.  There is a little wound area in the front of his site.   We cultured this.  This may be due to the trauma from his fall and possible lead to infection.  But we also sent in Bactrim  as well.  Will have him return in 1 week to remove further staples well as well as to reevaluate the wound   Current Outpatient Medications on File Prior to Visit  Medication Sig Dispense Refill   apixaban  (ELIQUIS ) 5 MG TABS tablet Take 1 tablet (5 mg total) by  mouth 2 (two) times daily.     aspirin  EC 81 MG tablet Take 1 tablet (81 mg total) by mouth daily.     atorvastatin  (LIPITOR ) 40 MG tablet Take 1 tablet (40 mg total) by mouth daily. 30 tablet 1   busPIRone  (BUSPAR ) 5 MG tablet Take 1 tablet (5 mg total) by mouth 3 (three) times daily as needed (anxiety). 30 tablet 0   dexlansoprazole  (DEXILANT ) 60 MG capsule Take 60 mg by mouth as needed.     gabapentin  (NEURONTIN ) 300 MG capsule Take 1 capsule (300 mg total) by mouth 2 (two) times daily. 60 capsule 1   ondansetron  (ZOFRAN ) 4 MG tablet Take 1 tablet (4 mg total) by mouth every 8 (eight) hours as needed for nausea or vomiting. 20 tablet 0   No current facility-administered medications on file prior to visit.    There are no Patient Instructions on file for this visit. No follow-ups on file.   Kalasia Crafton E Maevyn Riordan, NP

## 2023-11-21 NOTE — Telephone Encounter (Signed)
 Patient filled out [place] release of information for daughter & wife. Patient put an invalid phone number on all the contacts- unable to scan. Patient's wife advised that we need new one filled out. Mailed copy to patient per wife request. States they will fill it out and bring to next [facility] appointment.

## 2023-12-01 LAB — AEROBIC CULTURE

## 2023-12-03 ENCOUNTER — Other Ambulatory Visit (INDEPENDENT_AMBULATORY_CARE_PROVIDER_SITE_OTHER): Payer: Self-pay | Admitting: Nurse Practitioner

## 2023-12-03 ENCOUNTER — Telehealth (INDEPENDENT_AMBULATORY_CARE_PROVIDER_SITE_OTHER): Payer: Self-pay

## 2023-12-03 MED ORDER — OXYCODONE-ACETAMINOPHEN 7.5-325 MG PO TABS
1.0000 | ORAL_TABLET | ORAL | 0 refills | Status: DC | PRN
Start: 2023-12-03 — End: 2023-12-25

## 2023-12-03 MED ORDER — CIPROFLOXACIN HCL 250 MG PO TABS: 750.0000 mg | ORAL_TABLET | Freq: Two times a day (BID) | ORAL | 0 refills | Status: DC

## 2023-12-03 NOTE — Telephone Encounter (Signed)
Patient notified that medication has been sent 

## 2023-12-03 NOTE — Telephone Encounter (Signed)
 Patient requested for pain medication refill . Please Advise

## 2023-12-03 NOTE — Progress Notes (Signed)
 Patient was notified that antibiotic was sent to pharmacy based on culture results

## 2023-12-03 NOTE — Telephone Encounter (Signed)
 sent

## 2023-12-03 NOTE — Progress Notes (Signed)
 Sent in Cipro based on culture

## 2023-12-07 ENCOUNTER — Encounter (INDEPENDENT_AMBULATORY_CARE_PROVIDER_SITE_OTHER): Payer: Self-pay | Admitting: Nurse Practitioner

## 2023-12-07 ENCOUNTER — Ambulatory Visit (INDEPENDENT_AMBULATORY_CARE_PROVIDER_SITE_OTHER): Payer: Medicaid Other | Admitting: Nurse Practitioner

## 2023-12-07 VITALS — BP 102/68 | HR 60 | Resp 18 | Ht 73.0 in | Wt 170.0 lb

## 2023-12-07 DIAGNOSIS — Z89611 Acquired absence of right leg above knee: Secondary | ICD-10-CM

## 2023-12-07 NOTE — Progress Notes (Signed)
 Subjective:    Patient ID: Brendan Williams, male    DOB: [birth year], 63 y.o.   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  Today the redness is improved that was previously along his incision line following treatment with antibiotics.  However in the medial portion of his incision there is an opening that has about 1 cm of undermining all around.    Review of Systems  Skin:  Positive for wound.  Neurological:  Positive for weakness.  All other systems reviewed and are negative.      Objective:   Physical Exam Vitals reviewed.  HENT:     Head: Normocephalic.  Cardiovascular:     Rate and Rhythm: Normal rate.  Pulmonary:     Effort: Pulmonary effort is normal.  Musculoskeletal:     Right Lower Extremity: Right leg is amputated above knee.  Skin:    General: Skin is warm and dry.  Neurological:     Mental Status: He is alert and oriented to person, place, and time.     Motor: Weakness present.     Gait: Gait abnormal.  Psychiatric:        Mood and Affect: Mood normal.        Behavior: Behavior normal.        Thought Content: Thought content normal.     BP 102/68   Pulse 60   Resp 18   Ht 6\' 1"  (1.854 m)   Wt 170 lb (77.1 kg)   BMI 22.43 kg/m   Past Medical History:  Diagnosis Date   Anxiety    situational   Asthma    as a child   Atherosclerosis of native arteries of extremity with intermittent claudication (HCC)    Cellulitis and abscess of toe of right foot    Critical limb ischemia of right lower extremity (HCC)    Current smoker    Quit 04/16/23   Peripheral arterial disease (HCC)     Social History   Socioeconomic History   Marital status: Married    Spouse name: Beane,BRENDA (Spouse)   Number of children: Not on file   Years of education: Not on file   Highest education  level: Not on file  Occupational History   Occupation: gutter work  Tobacco Use   Smoking status: Former    Current packs/day: 0.00    Average packs/day: 1 pack/day for 40.5 years (40.5 ttl pk-yrs)    Types: Cigarettes    Start date: 5    Quit date: 04/16/2023    Years since quitting: 0.6   Smokeless tobacco: Never   Tobacco comments:    Last smoked 3-4 days ago  Vaping Use   Vaping status: Never Used  Substance and Sexual Activity   Alcohol use: Yes    Alcohol/week: 5.0 standard drinks of alcohol    Types: 5 Cans of beer per week    Comment: 4-5 beers a week   Drug use: Yes    Frequency: 7.0 times per week    Types: Marijuana    Comment: a joint daily   Sexual activity: Yes  Other Topics Concern   Not on file  Social History Narrative   Live in private residence with wife, Steward Drone. Had grandson and step daughter in the home   Social  Drivers of Corporate investment banker Strain: Not on file  Food Insecurity: No Food Insecurity (10/12/2023)   Hunger Vital Sign    Worried About Running Out of Food in the Last Year: Never true    Ran Out of Food in the Last Year: Never true  Transportation Needs: No Transportation Needs (10/12/2023)   PRAPARE - Administrator, Civil Service (Medical): No    Lack of Transportation (Non-Medical): No  Physical Activity: Not on file  Stress: Not on file  Social Connections: Not on file  Intimate Partner Violence: Not At Risk (10/12/2023)   Humiliation, Afraid, Rape, and Kick questionnaire    Fear of Current or Ex-Partner: No    Emotionally Abused: No    Physically Abused: No    Sexually Abused: No    Past Surgical History:  Procedure Laterality Date   AMPUTATION Right 10/12/2023   Procedure: AMPUTATION ABOVE KNEE;  Surgeon: Annice Needy, MD;  Location: [facility];  Service: General;  Laterality: Right;   AMPUTATION TOE Right 12/02/2019   Procedure: AMPUTATION RIGHT 5TH TOE;  Surgeon: Rosetta Posner, DPM;  Location: [facility];  Service: Podiatry;  Laterality: Right;   APPLICATION OF WOUND VAC Bilateral 11/28/2019   Procedure: APPLICATION OF WOUND VAC;  Surgeon: Annice Needy, MD;  Location: [facility];  Service: Vascular;  Laterality: Bilateral;  Prevena    BACK SURGERY     lower ruptured disk   CENTRAL VENOUS CATHETER INSERTION  11/28/2019   Procedure: INSERTION CENTRAL LINE ADULT;  Surgeon: Annice Needy, MD;  Location: [facility];  Service: Vascular;;   ENDARTERECTOMY FEMORAL Bilateral 11/28/2019   Procedure: ENDARTERECTOMY FEMORAL;  Surgeon: Annice Needy, MD;  Location: [facility];  Service: Vascular;  Laterality: Bilateral;   ENDARTERECTOMY FEMORAL Right 05/23/2023   Procedure: ENDARTERECTOMY FEMORAL REDO;  Surgeon: Annice Needy, MD;  Location: [facility];  Service: Vascular;  Laterality: Right;   ENDOVASCULAR REPAIR/STENT GRAFT Right 05/23/2023   Procedure: ENDOVASCULAR REPAIR/STENT GRAFT;  Surgeon: Annice Needy, MD;  Location: [facility];  Service: Vascular;  Laterality: Right;  Right SFA Stents   LOWER EXTREMITY ANGIOGRAPHY Right 11/14/2019   Procedure: LOWER EXTREMITY ANGIOGRAPHY;  Surgeon: Annice Needy, MD;  Location: [facility];  Service: Cardiovascular;  Laterality: Right;   LOWER EXTREMITY ANGIOGRAPHY Right 12/19/2021   Procedure: Lower Extremity Angiography;  Surgeon: Annice Needy, MD;  Location: [facility];  Service: Cardiovascular;  Laterality: Right;   LOWER EXTREMITY ANGIOGRAPHY Right 01/30/2022   Procedure: Lower Extremity Angiography;  Surgeon: Annice Needy, MD;  Location: [facility];  Service: Cardiovascular;  Laterality: Right;   LOWER EXTREMITY ANGIOGRAPHY Right 05/21/2023   Procedure: Lower Extremity Angiography;  Surgeon: Annice Needy, MD;  Location: [facility];  Service: Cardiovascular;  Laterality: Right;   LOWER EXTREMITY ANGIOGRAPHY Right 09/28/2023   Procedure: Lower Extremity Angiography;  Surgeon: Annice Needy, MD;  Location: [facility];   Service: Cardiovascular;  Laterality: Right;   MECHANICAL THROMBECTOMY WITH AORTOGRAM AND INTERVENTION  05/23/2023   Procedure: MECHANICAL THROMBECTOMY right popliteal artery;  Surgeon: Annice Needy, MD;  Location: [facility];  Service: Vascular;;   STOMACH SURGERY     exploratory   TONSILECTOMY, ADENOIDECTOMY, BILATERAL MYRINGOTOMY AND TUBES     TONSILLECTOMY     TRANSMETATARSAL AMPUTATION Right 06/21/2023   Procedure: TRANSMETATARSAL AMPUTATION;  Surgeon: Rosetta Posner, DPM;  Location: [facility];  Service:  Orthopedics/Podiatry;  Laterality: Right;    Family History  Problem Relation Age of Onset   Leukemia Mother    Heart disease Father        a. CABG   Heart disease Sister        a. MVR   Cancer Sister    Cancer Brother    CAD Brother        a. CABG    No Known Allergies     Latest Ref Rng & Units 10/19/2023    5:20 AM 10/18/2023    4:54 AM 10/15/2023    2:43 AM  CBC  WBC 4.0 - 10.5 K/uL 11.2  9.9  14.0   Hemoglobin 13.0 - 17.0 g/dL 9.2  8.9  9.2   Hematocrit 39.0 - 52.0 % 27.4  26.3  27.6   Platelets 150 - 400 K/uL 575  520  468       CMP     Component Value Date/Time   NA 135 10/19/2023 0520   K 4.0 10/19/2023 0520   CL 102 10/19/2023 0520   CO2 23 10/19/2023 0520   GLUCOSE 138 (H) 10/19/2023 0520   BUN 6 (L) 10/19/2023 0520   CREATININE 0.66 10/19/2023 0520   CALCIUM 8.3 (L) 10/19/2023 0520   PROT 6.1 (L) 10/12/2023 0411   ALBUMIN 2.3 (L) 10/12/2023 0411   AST 17 10/12/2023 0411   ALT 10 10/12/2023 0411   ALKPHOS 58 10/12/2023 0411   BILITOT 0.4 10/12/2023 0411   GFRNONAA >60 10/19/2023 0520     VAS [place] ABI WITH/WO TBI Result Date: 09/27/2023  LOWER EXTREMITY DOPPLER STUDY Patient Name:  MARCELLAS MARCHANT  Date of Exam:   09/27/2023 Medical Rec #: [id number]         Accession #:    [id number] Date of Birth: Aug 10, 1961          Patient Gender: M Patient Age:   61 years Exam Location:  [facility] Procedure:      VAS [place] ABI WITH/WO TBI  Referring Phys: Barbara Cower DEW --------------------------------------------------------------------------------  Indications: Claudication, rest pain, peripheral artery disease, and Severe pain              right leg. High Risk Factors: Past history of smoking. Other Factors: Right trans metatarsal amputation.  Vascular Interventions: 05/28/2023: right SFA atherectomy, stent, ATA PTA                          11/14/19: Bilateral CIA and EIA Stents.                          11/28/2019: Aorta, Bilateral CIA, Bilateral EIA and                         Bilateral SFA Stents with Right ATA Angioplasty.                         01/2022 rt thrombectomy and new stents sfa pop. Performing Technologist: Hardie Lora RVT  Examination Guidelines: A complete evaluation includes at minimum, Doppler waveform signals and systolic blood pressure reading at the level of bilateral brachial, anterior tibial, and posterior tibial arteries, when vessel segments are accessible. Bilateral testing is considered an integral part of a complete examination. Photoelectric Plethysmograph (PPG) waveforms and toe systolic pressure readings are included as required and additional duplex testing as needed.  Limited examinations for reoccurring indications may be performed as noted.  ABI Findings: +---------+------------------+-----+--------+----------+ Right    Rt Pressure (mmHg)IndexWaveformComment    +---------+------------------+-----+--------+----------+ Brachial 88                                        +---------+------------------+-----+--------+----------+ PTA      0                 0.00 absent             +---------+------------------+-----+--------+----------+ PERO     0                 0.00 absent             +---------+------------------+-----+--------+----------+ DP       0                 0.00 absent             +---------+------------------+-----+--------+----------+ Great Toe                                amputation +---------+------------------+-----+--------+----------+ +---------+------------------+-----+----------+-------+ Left     Lt Pressure (mmHg)IndexWaveform  Comment +---------+------------------+-----+----------+-------+ Brachial 97                                       +---------+------------------+-----+----------+-------+ PTA      81                0.84 monophasic        +---------+------------------+-----+----------+-------+ DP       78                0.80 monophasic        +---------+------------------+-----+----------+-------+ Great Toe28                0.29                   +---------+------------------+-----+----------+-------+ +-------+-----------+-----------+------------+------------+ ABI/TBIToday's ABIToday's TBIPrevious ABIPrevious TBI +-------+-----------+-----------+------------+------------+ Right  0.0        amputation 1.12        1.09         +-------+-----------+-----------+------------+------------+ Left   0.84       0.29       0.90        0.60         +-------+-----------+-----------+------------+------------+ No flow seen in right anterior tibial, posterior tibial, or peroneal arteries by duplex. Limited imaging showed right SFA and popliteal artery occlusion. Right ABIs appear decreased compared to prior study on 07/25/2022. Left ABIs appear essentially unchanged compared to prior study on 07/25/2022. Limited imaging showed occluded right SFA and popliteal arteries.  Summary: Right: Resting right ankle-brachial index indicates critical limb ischemia. Left: Resting left ankle-brachial index indicates mild left lower extremity arterial disease. The left toe-brachial index is abnormal. *See table(s) above for measurements and observations.  Electronically signed by Levora Dredge MD on 09/27/2023 at 4:45:04 PM.    Final        Assessment & Plan:   1. S/P AKA (above knee amputation), right (HCC) (Primary) All remaining staples  removed today with Steri-Strips applied.  We have also applied wet-to-dry dressing in the wound opening.  Patient is instructed to change his on a daily basis.  Will have her return in approximately 2 weeks for wound evaluation.  I have stressed the importance of the patient on not falling on his amputation site as this can cause worsening of his current wound, with possible complete dehiscence of his amputation site   Current Outpatient Medications on File Prior to Visit  Medication Sig Dispense Refill   apixaban (ELIQUIS) 5 MG TABS tablet Take 1 tablet (5 mg total) by mouth 2 (two) times daily.     aspirin EC 81 MG tablet Take 1 tablet (81 mg total) by mouth daily.     atorvastatin (LIPITOR) 40 MG tablet Take 1 tablet (40 mg total) by mouth daily. 30 tablet 1   busPIRone (BUSPAR) 5 MG tablet Take 1 tablet (5 mg total) by mouth 3 (three) times daily as needed (anxiety). 30 tablet 0   ciprofloxacin (CIPRO) 250 MG tablet Take 3 tablets (750 mg total) by mouth 2 (two) times daily. 60 tablet 0   dexlansoprazole (DEXILANT) 60 MG capsule Take 60 mg by mouth as needed.     gabapentin (NEURONTIN) 300 MG capsule Take 1 capsule (300 mg total) by mouth 2 (two) times daily. 60 capsule 1   ondansetron (ZOFRAN) 4 MG tablet Take 1 tablet (4 mg total) by mouth every 8 (eight) hours as needed for nausea or vomiting. 20 tablet 0   oxyCODONE-acetaminophen (PERCOCET) 7.5-325 MG tablet Take 1 tablet by mouth every 4 (four) hours as needed for severe pain (pain score 7-10). 35 tablet 0   sulfamethoxazole-trimethoprim (BACTRIM DS) 800-160 MG tablet Take 1 tablet by mouth 2 (two) times daily. 20 tablet 0   No current facility-administered medications on file prior to visit.    There are no Patient Instructions on file for this visit. No follow-ups on file.   Georgiana Spinner, NP

## 2023-12-07 NOTE — Telephone Encounter (Signed)
 Patient stated that [facility] in [place] will provide him free lumber for a ramp if our office provides them a letter of medical necessity. Patient states he has the labor to build the ramp, he just needs the lumber. Called [facility] at [phone]. Requested that we mail the letter to them (no fax available).  Please mail letter to: [facility] [address] [place], [place] [address]

## 2023-12-10 ENCOUNTER — Encounter (INDEPENDENT_AMBULATORY_CARE_PROVIDER_SITE_OTHER): Payer: Self-pay | Admitting: Nurse Practitioner

## 2023-12-10 NOTE — Telephone Encounter (Signed)
 I have completed the letter.  It should be on the printer.  I will leave it on your desk.

## 2023-12-25 ENCOUNTER — Ambulatory Visit (INDEPENDENT_AMBULATORY_CARE_PROVIDER_SITE_OTHER): Payer: Medicaid Other | Admitting: Nurse Practitioner

## 2023-12-25 ENCOUNTER — Other Ambulatory Visit (INDEPENDENT_AMBULATORY_CARE_PROVIDER_SITE_OTHER): Payer: Self-pay | Admitting: Nurse Practitioner

## 2023-12-25 ENCOUNTER — Ambulatory Visit (INDEPENDENT_AMBULATORY_CARE_PROVIDER_SITE_OTHER)

## 2023-12-25 ENCOUNTER — Encounter (INDEPENDENT_AMBULATORY_CARE_PROVIDER_SITE_OTHER): Payer: Self-pay | Admitting: Nurse Practitioner

## 2023-12-25 VITALS — BP 108/77 | HR 87 | Resp 18 | Ht 73.0 in | Wt 170.0 lb

## 2023-12-25 DIAGNOSIS — I998 Other disorder of circulatory system: Secondary | ICD-10-CM

## 2023-12-25 DIAGNOSIS — M79605 Pain in left leg: Secondary | ICD-10-CM

## 2023-12-25 DIAGNOSIS — Z89611 Acquired absence of right leg above knee: Secondary | ICD-10-CM

## 2023-12-25 DIAGNOSIS — Z9889 Other specified postprocedural states: Secondary | ICD-10-CM

## 2023-12-25 DIAGNOSIS — G629 Polyneuropathy, unspecified: Secondary | ICD-10-CM

## 2023-12-25 MED ORDER — DEXLANSOPRAZOLE 60 MG PO CPDR: 60.0000 mg | DELAYED_RELEASE_CAPSULE | ORAL | 0 refills | Status: AC | PRN

## 2023-12-25 MED ORDER — APIXABAN 5 MG PO TABS
5.0000 mg | ORAL_TABLET | Freq: Two times a day (BID) | ORAL | Status: DC
Start: 2023-12-25 — End: 2024-01-23

## 2023-12-25 MED ORDER — ATORVASTATIN CALCIUM 40 MG PO TABS: 40.0000 mg | ORAL_TABLET | Freq: Every day | ORAL | 1 refills | Status: DC

## 2023-12-25 MED ORDER — CIPROFLOXACIN HCL 250 MG PO TABS: 750.0000 mg | ORAL_TABLET | Freq: Two times a day (BID) | ORAL | 0 refills | Status: AC

## 2023-12-25 MED ORDER — GABAPENTIN 300 MG PO CAPS: 300.0000 mg | ORAL_CAPSULE | Freq: Two times a day (BID) | ORAL | 1 refills | Status: AC

## 2023-12-25 MED ORDER — OXYCODONE-ACETAMINOPHEN 7.5-325 MG PO TABS: 1.0000 | ORAL_TABLET | ORAL | 0 refills | Status: DC | PRN

## 2023-12-25 MED ORDER — BUSPIRONE HCL 5 MG PO TABS
5.0000 mg | ORAL_TABLET | Freq: Three times a day (TID) | ORAL | 0 refills | Status: AC | PRN
Start: 2023-12-25 — End: ?

## 2023-12-26 ENCOUNTER — Other Ambulatory Visit: Payer: Self-pay

## 2023-12-26 ENCOUNTER — Encounter: Payer: Self-pay | Admitting: Emergency Medicine

## 2023-12-26 ENCOUNTER — Inpatient Hospital Stay
Admission: EM | Admit: 2023-12-26 | Discharge: 2024-01-01 | DRG: 269 | Disposition: A | Discharge status: Home / Self Care | Attending: Student | Admitting: Student

## 2023-12-26 DIAGNOSIS — Z87891 Personal history of nicotine dependence: Secondary | ICD-10-CM

## 2023-12-26 DIAGNOSIS — Z7901 Long term (current) use of anticoagulants: Secondary | ICD-10-CM

## 2023-12-26 DIAGNOSIS — R21 Rash and other nonspecific skin eruption: Secondary | ICD-10-CM | POA: Diagnosis not present

## 2023-12-26 DIAGNOSIS — Z8249 Family history of ischemic heart disease and other diseases of the circulatory system: Secondary | ICD-10-CM | POA: Diagnosis not present

## 2023-12-26 DIAGNOSIS — I739 Peripheral vascular disease, unspecified: Secondary | ICD-10-CM

## 2023-12-26 DIAGNOSIS — G629 Polyneuropathy, unspecified: Secondary | ICD-10-CM

## 2023-12-26 DIAGNOSIS — I959 Hypotension, unspecified: Secondary | ICD-10-CM | POA: Diagnosis not present

## 2023-12-26 DIAGNOSIS — Z7982 Long term (current) use of aspirin: Secondary | ICD-10-CM

## 2023-12-26 DIAGNOSIS — Y835 Amputation of limb(s) as the cause of abnormal reaction of the patient, or of later complication, without mention of misadventure at the time of the procedure: Secondary | ICD-10-CM | POA: Diagnosis present

## 2023-12-26 DIAGNOSIS — I743 Embolism and thrombosis of arteries of the lower extremities: Secondary | ICD-10-CM | POA: Diagnosis present

## 2023-12-26 DIAGNOSIS — I745 Embolism and thrombosis of iliac artery: Secondary | ICD-10-CM | POA: Diagnosis present

## 2023-12-26 DIAGNOSIS — Z95820 Peripheral vascular angioplasty status with implants and grafts: Secondary | ICD-10-CM

## 2023-12-26 DIAGNOSIS — T8789 Other complications of amputation stump: Secondary | ICD-10-CM | POA: Diagnosis present

## 2023-12-26 DIAGNOSIS — I700 Atherosclerosis of aorta: Secondary | ICD-10-CM | POA: Diagnosis present

## 2023-12-26 DIAGNOSIS — I7409 Other arterial embolism and thrombosis of abdominal aorta: Secondary | ICD-10-CM | POA: Diagnosis not present

## 2023-12-26 DIAGNOSIS — Z79899 Other long term (current) drug therapy: Secondary | ICD-10-CM

## 2023-12-26 DIAGNOSIS — D509 Iron deficiency anemia, unspecified: Secondary | ICD-10-CM | POA: Diagnosis present

## 2023-12-26 DIAGNOSIS — T82868A Thrombosis of vascular prosthetic devices, implants and grafts, initial encounter: Secondary | ICD-10-CM | POA: Diagnosis not present

## 2023-12-26 DIAGNOSIS — I70222 Atherosclerosis of native arteries of extremities with rest pain, left leg: Secondary | ICD-10-CM

## 2023-12-26 DIAGNOSIS — E538 Deficiency of other specified B group vitamins: Secondary | ICD-10-CM | POA: Diagnosis present

## 2023-12-26 DIAGNOSIS — T82856A Stenosis of peripheral vascular stent, initial encounter: Principal | ICD-10-CM | POA: Diagnosis present

## 2023-12-26 DIAGNOSIS — Z89611 Acquired absence of right leg above knee: Secondary | ICD-10-CM

## 2023-12-26 DIAGNOSIS — Z9889 Other specified postprocedural states: Secondary | ICD-10-CM | POA: Diagnosis not present

## 2023-12-26 DIAGNOSIS — Z806 Family history of leukemia: Secondary | ICD-10-CM

## 2023-12-26 DIAGNOSIS — Y712 Prosthetic and other implants, materials and accessory cardiovascular devices associated with adverse incidents: Secondary | ICD-10-CM | POA: Diagnosis present

## 2023-12-26 DIAGNOSIS — M79605 Pain in left leg: Secondary | ICD-10-CM | POA: Diagnosis present

## 2023-12-26 DIAGNOSIS — I70201 Unspecified atherosclerosis of native arteries of extremities, right leg: Secondary | ICD-10-CM | POA: Diagnosis present

## 2023-12-26 DIAGNOSIS — I998 Other disorder of circulatory system: Principal | ICD-10-CM

## 2023-12-26 LAB — HEPARIN LEVEL (UNFRACTIONATED)
Heparin Unfractionated: 0.44 [IU]/mL (ref 0.30–0.70)
Heparin Unfractionated: 0.48 [IU]/mL (ref 0.30–0.70)

## 2023-12-26 LAB — COMPREHENSIVE METABOLIC PANEL
ALT: 13 U/L (ref 0–44)
AST: 29 U/L (ref 15–41)
Albumin: 3.5 g/dL (ref 3.5–5.0)
Alkaline Phosphatase: 73 U/L (ref 38–126)
Anion gap: 12 (ref 5–15)
BUN: 10 mg/dL (ref 8–23)
CO2: 23 mmol/L (ref 22–32)
Calcium: 9.1 mg/dL (ref 8.9–10.3)
Chloride: 102 mmol/L (ref 98–111)
Creatinine, Ser: 0.73 mg/dL (ref 0.61–1.24)
GFR, Estimated: >60 mL/min (ref >60)
Glucose, Bld: 87 mg/dL (ref 70–99)
Potassium: 4.6 mmol/L (ref 3.5–5.1)
Sodium: 137 mmol/L (ref 135–145)
Total Bilirubin: 1.9 mg/dL — ABNORMAL HIGH (ref 0.0–1.2)
Total Protein: 7.3 g/dL (ref 6.5–8.1)

## 2023-12-26 LAB — CBC
HCT: 37.1 % — ABNORMAL LOW (ref 39.0–52.0)
Hemoglobin: 12.2 g/dL — ABNORMAL LOW (ref 13.0–17.0)
MCH: 28.8 pg (ref 26.0–34.0)
MCHC: 32.9 g/dL (ref 30.0–36.0)
MCV: 87.7 fL (ref 80.0–100.0)
Platelets: 324 10*3/uL (ref 150–400)
RBC: 4.23 MIL/uL (ref 4.22–5.81)
RDW: 15.3 % (ref 11.5–15.5)
WBC: 9.2 10*3/uL (ref 4.0–10.5)
nRBC: 0 % (ref 0.0–0.2)

## 2023-12-26 LAB — APTT
aPTT: 34 s (ref 24–36)
aPTT: 35 s (ref 24–36)

## 2023-12-26 LAB — PROTIME-INR
INR: 1 (ref 0.8–1.2)
Prothrombin Time: 13.4 s (ref 11.4–15.2)

## 2023-12-26 LAB — MRSA NEXT GEN BY PCR, NASAL: MRSA by PCR Next Gen: NOT DETECTED

## 2023-12-26 MED ORDER — HYDROMORPHONE HCL 1 MG/ML IJ SOLN
0.5000 mg | INTRAMUSCULAR | Status: DC | PRN
  Administered 2023-12-26 – 2024-01-01 (×20): 1 mg via INTRAVENOUS
  Filled 2023-12-26 (×21): qty 1

## 2023-12-26 MED ORDER — LINEZOLID 600 MG/300ML IV SOLN
600.0000 mg | Freq: Two times a day (BID) | INTRAVENOUS | Status: DC
Start: 2023-12-26 — End: 2023-12-27
  Administered 2023-12-26 – 2023-12-27 (×2): 600 mg via INTRAVENOUS
  Filled 2023-12-26 (×3): qty 300

## 2023-12-26 MED ORDER — ACETAMINOPHEN 325 MG PO TABS
650.0000 mg | ORAL_TABLET | Freq: Four times a day (QID) | ORAL | Status: DC | PRN
  Administered 2023-12-30: 650 mg via ORAL
  Filled 2023-12-26 (×2): qty 2

## 2023-12-26 MED ORDER — POLYETHYLENE GLYCOL 3350 17 G PO PACK
17.0000 g | PACK | Freq: Every day | ORAL | Status: DC | PRN
  Administered 2023-12-27 – 2024-01-01 (×2): 17 g via ORAL
  Filled 2023-12-26 (×2): qty 1

## 2023-12-26 MED ORDER — ONDANSETRON HCL 4 MG/2ML IJ SOLN
4.0000 mg | Freq: Once | INTRAMUSCULAR | Status: AC
  Administered 2023-12-26: 4 mg via INTRAVENOUS
  Filled 2023-12-26: qty 2

## 2023-12-26 MED ORDER — OXYCODONE HCL 5 MG PO TABS
5.0000 mg | ORAL_TABLET | ORAL | Status: DC | PRN
  Administered 2023-12-27 – 2024-01-01 (×14): 5 mg via ORAL
  Filled 2023-12-26 (×14): qty 1

## 2023-12-26 MED ORDER — SODIUM CHLORIDE 0.9% FLUSH
3.0000 mL | Freq: Two times a day (BID) | INTRAVENOUS | Status: DC
  Administered 2023-12-26 – 2024-01-01 (×7): 3 mL via INTRAVENOUS

## 2023-12-26 MED ORDER — ONDANSETRON HCL 4 MG PO TABS: 4.0000 mg | ORAL_TABLET | Freq: Four times a day (QID) | ORAL | Status: DC | PRN

## 2023-12-26 MED ORDER — HEPARIN (PORCINE) 25000 UT/250ML-% IV SOLN
1900.0000 [IU]/h | INTRAVENOUS | Status: DC
  Administered 2023-12-26 – 2023-12-28 (×3): 1250 [IU]/h via INTRAVENOUS
  Administered 2023-12-29: 1600 [IU]/h via INTRAVENOUS
  Administered 2023-12-29: 1400 [IU]/h via INTRAVENOUS
  Administered 2023-12-30 – 2024-01-01 (×3): 1900 [IU]/h via INTRAVENOUS
  Filled 2023-12-26 (×9): qty 250

## 2023-12-26 MED ORDER — MORPHINE SULFATE (PF) 4 MG/ML IV SOLN
4.0000 mg | Freq: Once | INTRAVENOUS | Status: AC
  Administered 2023-12-26: 4 mg via INTRAVENOUS
  Filled 2023-12-26: qty 1

## 2023-12-26 MED ORDER — DIPHENHYDRAMINE HCL 50 MG/ML IJ SOLN
25.0000 mg | Freq: Four times a day (QID) | INTRAMUSCULAR | Status: DC | PRN
Start: 2023-12-26 — End: 2024-01-01

## 2023-12-26 MED ORDER — GABAPENTIN 300 MG PO CAPS
300.0000 mg | ORAL_CAPSULE | Freq: Two times a day (BID) | ORAL | Status: DC
  Administered 2023-12-26 – 2024-01-01 (×11): 300 mg via ORAL
  Filled 2023-12-26 (×12): qty 1

## 2023-12-26 MED ORDER — VANCOMYCIN HCL 1750 MG/350ML IV SOLN
1750.0000 mg | Freq: Once | INTRAVENOUS | Status: AC
  Administered 2023-12-26: 1750 mg via INTRAVENOUS
  Filled 2023-12-26: qty 350

## 2023-12-26 MED ORDER — SODIUM CHLORIDE 0.9 % IV SOLN
2.0000 g | Freq: Three times a day (TID) | INTRAVENOUS | Status: DC
  Administered 2023-12-26 – 2023-12-27 (×3): 2 g via INTRAVENOUS
  Filled 2023-12-26 (×3): qty 12.5

## 2023-12-26 MED ORDER — ONDANSETRON HCL 4 MG/2ML IJ SOLN
4.0000 mg | Freq: Four times a day (QID) | INTRAMUSCULAR | Status: DC | PRN
  Administered 2023-12-27: 4 mg via INTRAVENOUS

## 2023-12-26 MED ORDER — ASPIRIN 81 MG PO TBEC
81.0000 mg | DELAYED_RELEASE_TABLET | Freq: Every day | ORAL | Status: DC
  Administered 2023-12-27 – 2024-01-01 (×6): 81 mg via ORAL
  Filled 2023-12-26 (×6): qty 1

## 2023-12-26 MED ORDER — HEPARIN BOLUS VIA INFUSION
4500.0000 [IU] | Freq: Once | INTRAVENOUS | Status: AC
  Administered 2023-12-26: 4500 [IU] via INTRAVENOUS
  Filled 2023-12-26: qty 4500

## 2023-12-26 MED ORDER — ACETAMINOPHEN 650 MG RE SUPP: 650.0000 mg | Freq: Four times a day (QID) | RECTAL | Status: DC | PRN

## 2023-12-26 MED ORDER — ATORVASTATIN CALCIUM 20 MG PO TABS
40.0000 mg | ORAL_TABLET | Freq: Every day | ORAL | Status: DC
  Administered 2023-12-26 – 2023-12-31 (×6): 40 mg via ORAL
  Filled 2023-12-26 (×6): qty 2

## 2023-12-26 NOTE — Assessment & Plan Note (Signed)
 Patient reports 2 days of cold and painful left lower extremity concerning for critical limb ischemia in the setting of known extensive PAD.  He missed only 1 day of Eliquis in the past week, but did take it this morning.  - Vascular surgery consulted; appreciate their recommendations - Heparin infusion per pharmacy dosing - Hold home Eliquis - Continue statin - Pain control with oxycodone and Dilaudid

## 2023-12-26 NOTE — Assessment & Plan Note (Signed)
 Unfortunately, patient has had poor wound healing of his left AKA stump.  Today, appears worse than 3 weeks ago with increased drainage.  Previous culture data with Pseudomonas and E. coli.  MRSA PCR negative.  Per patient, vascular surgery is planning to washout with possible wound VAC.  - Vascular surgery consulted; appreciate their recommendations - Start cefepime and vancomycin - Repeat MRSA PCR.  If negative, can discontinue vancomycin at that time.

## 2023-12-26 NOTE — ED Notes (Signed)
 See triage note  Presents with left leg pain  Sent on from Dr Driscilla Grammes office  Foot is cold to touch

## 2023-12-26 NOTE — H&P (Signed)
 History and Physical    Patient: Brendan Williams:[id number] DOB: [birth year] DOA: 12/26/2023 DOS: the patient was seen and examined on 12/26/2023 PCP: Gildardo Pounds, PA  Patient coming from: Home  Chief Complaint:  Chief Complaint  Patient presents with   Leg Pain   HPI: Brendan Williams is a 63 y.o. 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  [name] states that 2 days ago, he was standing in his kitchen when he had sudden onset pain in his left lower leg.  Pain has progressively worsened since then and he has noticed that his leg was very cold.  He denies any other symptoms including nausea, vomiting, diarrhea, chest pain, shortness of breath.  He notes that he has been having wound issues with his right stump and it has been draining but he has not noticed any odor or redness.  ED Course:  On arrival to the ED, patient was normotensive at 117/84 with HR of 69. He was saturating at 100% on room air. He was afebrile at 98.4. Initial work up notable for hemoglobin of 12.2 and unremarkable CMP. INR and PTT within normal limits. Patient started on heparin infusion and Vascular surgery consulted. [facility] contacted for admission.   Review of Systems: As mentioned in the history of present illness. All other systems reviewed and are negative.  Past Medical History:  Diagnosis Date   Anxiety    situational   Asthma    as a child   Atherosclerosis of native arteries of extremity with intermittent claudication (HCC)    Cellulitis and abscess of toe of right foot    Critical limb ischemia of right lower extremity (HCC)    Current smoker    Quit 04/16/23   Peripheral arterial disease (HCC)    Past Surgical History:  Procedure Laterality Date   AMPUTATION Right 10/12/2023   Procedure: AMPUTATION ABOVE KNEE;  Surgeon: Annice Needy, MD;  Location: [facility];  Service:  General;  Laterality: Right;   AMPUTATION TOE Right 12/02/2019   Procedure: AMPUTATION RIGHT 5TH TOE;  Surgeon: Rosetta Posner, DPM;  Location: [facility];  Service: Podiatry;  Laterality: Right;   APPLICATION OF WOUND VAC Bilateral 11/28/2019   Procedure: APPLICATION OF WOUND VAC;  Surgeon: Annice Needy, MD;  Location: [facility];  Service: Vascular;  Laterality: Bilateral;  Prevena    BACK SURGERY     lower ruptured disk   CENTRAL VENOUS CATHETER INSERTION  11/28/2019   Procedure: INSERTION CENTRAL LINE ADULT;  Surgeon: Annice Needy, MD;  Location: [facility];  Service: Vascular;;   ENDARTERECTOMY FEMORAL Bilateral 11/28/2019   Procedure: ENDARTERECTOMY FEMORAL;  Surgeon: Annice Needy, MD;  Location: [facility];  Service: Vascular;  Laterality: Bilateral;   ENDARTERECTOMY FEMORAL Right 05/23/2023   Procedure: ENDARTERECTOMY FEMORAL REDO;  Surgeon: Annice Needy, MD;  Location: [facility];  Service: Vascular;  Laterality: Right;   ENDOVASCULAR REPAIR/STENT GRAFT Right 05/23/2023   Procedure: ENDOVASCULAR REPAIR/STENT GRAFT;  Surgeon: Annice Needy, MD;  Location: [facility];  Service: Vascular;  Laterality: Right;  Right SFA Stents   LOWER EXTREMITY ANGIOGRAPHY Right 11/14/2019   Procedure: LOWER EXTREMITY ANGIOGRAPHY;  Surgeon: Annice Needy, MD;  Location: [facility];  Service: Cardiovascular;  Laterality: Right;   LOWER EXTREMITY ANGIOGRAPHY Right 12/19/2021   Procedure: Lower Extremity Angiography;  Surgeon: Annice Needy,  MD;  Location: [facility];  Service: Cardiovascular;  Laterality: Right;   LOWER EXTREMITY ANGIOGRAPHY Right 01/30/2022   Procedure: Lower Extremity Angiography;  Surgeon: Annice Needy, MD;  Location: [facility];  Service: Cardiovascular;  Laterality: Right;   LOWER EXTREMITY ANGIOGRAPHY Right 05/21/2023   Procedure: Lower Extremity Angiography;  Surgeon: Annice Needy, MD;  Location: [facility];  Service: Cardiovascular;  Laterality: Right;    LOWER EXTREMITY ANGIOGRAPHY Right 09/28/2023   Procedure: Lower Extremity Angiography;  Surgeon: Annice Needy, MD;  Location: [facility];  Service: Cardiovascular;  Laterality: Right;   MECHANICAL THROMBECTOMY WITH AORTOGRAM AND INTERVENTION  05/23/2023   Procedure: MECHANICAL THROMBECTOMY right popliteal artery;  Surgeon: Annice Needy, MD;  Location: [facility];  Service: Vascular;;   STOMACH SURGERY     exploratory   TONSILECTOMY, ADENOIDECTOMY, BILATERAL MYRINGOTOMY AND TUBES     TONSILLECTOMY     TRANSMETATARSAL AMPUTATION Right 06/21/2023   Procedure: TRANSMETATARSAL AMPUTATION;  Surgeon: Rosetta Posner, DPM;  Location: [facility];  Service: Orthopedics/Podiatry;  Laterality: Right;   Social History:  reports that he quit smoking about 8 months ago. His smoking use included cigarettes. He started smoking about 41 years ago. He has a 40.5 pack-year smoking history. He has never used smokeless tobacco. He reports current alcohol use of about 5.0 standard drinks of alcohol per week. He reports current drug use. Frequency: 7.00 times per week. Drug: Marijuana.  No Known Allergies  Family History  Problem Relation Age of Onset   Leukemia Mother    Heart disease Father        a. CABG   Heart disease Sister        a. MVR   Cancer Sister    Cancer Brother    CAD Brother        a. CABG    Prior to Admission medications   Medication Sig Start Date End Date Taking? Authorizing Provider  apixaban (ELIQUIS) 5 MG TABS tablet Take 1 tablet (5 mg total) by mouth 2 (two) times daily. 12/25/23 02/23/24  Georgiana Spinner, NP  aspirin EC 81 MG tablet Take 1 tablet (81 mg total) by mouth daily. 06/21/23   Rosetta Posner, DPM  atorvastatin (LIPITOR) 40 MG tablet Take 1 tablet (40 mg total) by mouth daily. 12/25/23 02/23/24  Georgiana Spinner, NP  busPIRone (BUSPAR) 5 MG tablet Take 1 tablet (5 mg total) by mouth 3 (three) times daily as needed (anxiety). 12/25/23   Georgiana Spinner, NP   ciprofloxacin (CIPRO) 250 MG tablet Take 3 tablets (750 mg total) by mouth 2 (two) times daily. 12/25/23   Georgiana Spinner, NP  dexlansoprazole (DEXILANT) 60 MG capsule Take 1 capsule (60 mg total) by mouth as needed. 12/25/23   Georgiana Spinner, NP  gabapentin (NEURONTIN) 300 MG capsule Take 1 capsule (300 mg total) by mouth 2 (two) times daily. 12/25/23 02/23/24  Georgiana Spinner, NP  ondansetron (ZOFRAN) 4 MG tablet Take 1 tablet (4 mg total) by mouth every 8 (eight) hours as needed for nausea or vomiting. 06/21/23   Rosetta Posner, DPM  oxyCODONE-acetaminophen (PERCOCET) 7.5-325 MG tablet Take 1 tablet by mouth every 4 (four) hours as needed for severe pain (pain score 7-10). 12/25/23 12/24/24  Georgiana Spinner, NP  sulfamethoxazole-trimethoprim (BACTRIM DS) 800-160 MG tablet Take 1 tablet by mouth 2 (two) times daily. 11/21/23   Georgiana Spinner, NP    Physical Exam:  Vitals:   12/26/23 1144  BP: 117/84  Pulse: 69  Resp: 18  Temp: 98.4 F (36.9 C)  TempSrc: Oral  SpO2: 100%  Weight: 77.1 kg  Height: 6\' 1"  (1.854 m)   Physical Exam Vitals and nursing note reviewed.  Constitutional:      General: He is not in acute distress.    Appearance: He is normal weight. He is not toxic-appearing.  HENT:     Head: Normocephalic and atraumatic.     Mouth/Throat:     Mouth: Mucous membranes are moist.     Pharynx: Oropharynx is clear.  Cardiovascular:     Rate and Rhythm: Normal rate and regular rhythm.     Heart sounds: No murmur heard. Pulmonary:     Effort: Pulmonary effort is normal. No respiratory distress.     Breath sounds: Normal breath sounds. No wheezing, rhonchi or rales.  Abdominal:     General: Bowel sounds are normal. There is no distension.     Palpations: Abdomen is soft.     Tenderness: There is no abdominal tenderness. There is no guarding.  Musculoskeletal:     Left lower leg: No edema.     Right Lower Extremity: Right leg is amputated above knee.  Feet:     Right foot:      Skin integrity: Ulcer present.  Skin:    Comments: Left foot and calf is cool to the touch.  Right stump with approximately 1 cm wide and deep tract with purulent drainage.  Minimal surrounding erythema.  Neurological:     Mental Status: He is alert and oriented to person, place, and time. Mental status is at baseline.  Psychiatric:        Mood and Affect: Mood normal.        Behavior: Behavior normal.      Data Reviewed: CBC with hemoglobin of 9.2, hemoglobin of 12.2, and platelets of 324.  CMP with sodium of 137, potassium of 4.6, bicarb 24, BUN 10, creatinine 0.73, AST 29, ALT 13, GFR above 60 INR 1.0  There are no new results to review at this time.  Assessment and Plan:  * Critical limb ischemia of left lower extremity (HCC) Patient reports 2 days of cold and painful left lower extremity concerning for critical limb ischemia in the setting of known extensive PAD.  He missed only 1 day of Eliquis in the past week, but did take it this morning.  - Vascular surgery consulted; appreciate their recommendations - Heparin infusion per pharmacy dosing - Hold home Eliquis - Continue statin - Pain control with oxycodone and Dilaudid  PAD (peripheral artery disease) (HCC) Extensive PAD requiring multiple angiographies and subsequent right AKA in December 2024.  He has only missed 1 day of Eliquis in the last week but reports otherwise good compliance.  He is no longer smoking.  - Management of critical limb ischemia as noted above  Non-healing wound of amputation stump (HCC) Unfortunately, patient has had poor wound healing of his left AKA stump.  Today, appears worse than 3 weeks ago with increased drainage.  Previous culture data with Pseudomonas and E. coli.  MRSA PCR negative.  Per patient, vascular surgery is planning to washout with possible wound VAC.  - Vascular surgery consulted; appreciate their recommendations - Start cefepime and vancomycin - Repeat MRSA PCR.  If  negative, can discontinue vancomycin at that time.  Neuropathy - Continue home gabapentin  Advance Care Planning:   Code Status: Full Code  Consults: Vascular surgery  Family Communication: No family at bedside  Severity of Illness: The appropriate patient status for this patient is INPATIENT. Inpatient status is judged to be reasonable and necessary in order to provide the required intensity of service to ensure the patient's safety. The patient's presenting symptoms, physical exam findings, and initial radiographic and laboratory data in the context of their chronic comorbidities is felt to place them at high risk for further clinical deterioration. Furthermore, it is not anticipated that the patient will be medically stable for discharge from the hospital within 2 midnights of admission.   * I certify that at the point of admission it is my clinical judgment that the patient will require inpatient hospital care spanning beyond 2 midnights from the point of admission due to high intensity of service, high risk for further deterioration and high frequency of surveillance required.*  Author: Verdene Lennert, MD 12/26/2023 2:46 PM  For on call review [email or web address].

## 2023-12-26 NOTE — Progress Notes (Signed)
 Subjective:    Patient ID: Brendan Williams, male    DOB: Feb 02, 1961, 63 y.o.   MRN: [id number] Chief Complaint  Patient presents with   Follow-up    fu 2-3 weeks no studies    The patient returns today for follow-up evaluation of his right above-knee amputation site.  He subsequently developed a dehisced area in the middle of his residual limb following a fall.  He was advised to do wet-to-dry dressings on a daily basis but notes that it was changed every few days.  The wound itself has not improved and has begun to show some beginning signs of tunneling.  In addition he notes that he was standing on his left lower extremity and began to feel sudden pain and discomfort in that left leg.  He notes that his foot also became very cold during that same timeframe.  He notes that during this timeframe he ran out of all of his medications including his Eliquis.    Review of Systems  Skin:  Positive for wound.  All other systems reviewed and are negative.      Objective:   Physical Exam Vitals reviewed.  HENT:     Head: Normocephalic.  Cardiovascular:     Rate and Rhythm: Normal rate.  Pulmonary:     Effort: Pulmonary effort is normal.  Musculoskeletal:     Right Lower Extremity: Right leg is amputated above knee.  Skin:    General: Skin is dry.     Capillary Refill: Capillary refill takes more than 3 seconds.  Neurological:     Mental Status: He is alert and oriented to person, place, and time.     Gait: Gait abnormal.  Psychiatric:        Mood and Affect: Mood normal.        Behavior: Behavior normal.        Thought Content: Thought content normal.        Judgment: Judgment normal.     BP 108/77   Pulse 87   Resp 18   Ht 6\' 1"  (1.854 m)   Wt 170 lb (77.1 kg)   BMI 22.43 kg/m   Past Medical History:  Diagnosis Date   Anxiety    situational   Asthma    as a child   Atherosclerosis of native arteries of extremity with intermittent claudication (HCC)    Cellulitis  and abscess of toe of right foot    Critical limb ischemia of right lower extremity (HCC)    Current smoker    Quit 04/16/23   Peripheral arterial disease (HCC)     Social History   Socioeconomic History   Marital status: Married    Spouse name: Tsou,BRENDA (Spouse)   Number of children: Not on file   Years of education: Not on file   Highest education level: Not on file  Occupational History   Occupation: gutter work  Tobacco Use   Smoking status: Former    Current packs/day: 0.00    Average packs/day: 1 pack/day for 40.5 years (40.5 ttl pk-yrs)    Types: Cigarettes    Start date: 86    Quit date: 04/16/2023    Years since quitting: 0.6   Smokeless tobacco: Never   Tobacco comments:    Last smoked 3-4 days ago  Vaping Use   Vaping status: Never Used  Substance and Sexual Activity   Alcohol use: Yes    Alcohol/week: 5.0 standard drinks of alcohol    Types:  5 Cans of beer per week    Comment: 4-5 beers a week   Drug use: Yes    Frequency: 7.0 times per week    Types: Marijuana    Comment: a joint daily   Sexual activity: Yes  Other Topics Concern   Not on file  Social History Narrative   Live in private residence with wife, Steward Drone. Had grandson and step daughter in the home   Social Drivers of Health   Financial Resource Strain: Not on file  Food Insecurity: No Food Insecurity (10/12/2023)   Hunger Vital Sign    Worried About Running Out of Food in the Last Year: Never true    Ran Out of Food in the Last Year: Never true  Transportation Needs: No Transportation Needs (10/12/2023)   PRAPARE - Administrator, Civil Service (Medical): No    Lack of Transportation (Non-Medical): No  Physical Activity: Not on file  Stress: Not on file  Social Connections: Not on file  Intimate Partner Violence: Not At Risk (10/12/2023)   Humiliation, Afraid, Rape, and Kick questionnaire    Fear of Current or Ex-Partner: No    Emotionally Abused: No    Physically  Abused: No    Sexually Abused: No    Past Surgical History:  Procedure Laterality Date   AMPUTATION Right 10/12/2023   Procedure: AMPUTATION ABOVE KNEE;  Surgeon: Annice Needy, MD;  Location: [facility];  Service: General;  Laterality: Right;   AMPUTATION TOE Right 12/02/2019   Procedure: AMPUTATION RIGHT 5TH TOE;  Surgeon: Rosetta Posner, DPM;  Location: [facility];  Service: Podiatry;  Laterality: Right;   APPLICATION OF WOUND VAC Bilateral 11/28/2019   Procedure: APPLICATION OF WOUND VAC;  Surgeon: Annice Needy, MD;  Location: [facility];  Service: Vascular;  Laterality: Bilateral;  Prevena    BACK SURGERY     lower ruptured disk   CENTRAL VENOUS CATHETER INSERTION  11/28/2019   Procedure: INSERTION CENTRAL LINE ADULT;  Surgeon: Annice Needy, MD;  Location: [facility];  Service: Vascular;;   ENDARTERECTOMY FEMORAL Bilateral 11/28/2019   Procedure: ENDARTERECTOMY FEMORAL;  Surgeon: Annice Needy, MD;  Location: [facility];  Service: Vascular;  Laterality: Bilateral;   ENDARTERECTOMY FEMORAL Right 05/23/2023   Procedure: ENDARTERECTOMY FEMORAL REDO;  Surgeon: Annice Needy, MD;  Location: [facility];  Service: Vascular;  Laterality: Right;   ENDOVASCULAR REPAIR/STENT GRAFT Right 05/23/2023   Procedure: ENDOVASCULAR REPAIR/STENT GRAFT;  Surgeon: Annice Needy, MD;  Location: [facility];  Service: Vascular;  Laterality: Right;  Right SFA Stents   LOWER EXTREMITY ANGIOGRAPHY Right 11/14/2019   Procedure: LOWER EXTREMITY ANGIOGRAPHY;  Surgeon: Annice Needy, MD;  Location: [facility];  Service: Cardiovascular;  Laterality: Right;   LOWER EXTREMITY ANGIOGRAPHY Right 12/19/2021   Procedure: Lower Extremity Angiography;  Surgeon: Annice Needy, MD;  Location: [facility];  Service: Cardiovascular;  Laterality: Right;   LOWER EXTREMITY ANGIOGRAPHY Right 01/30/2022   Procedure: Lower Extremity Angiography;  Surgeon: Annice Needy, MD;  Location: [facility];  Service: Cardiovascular;   Laterality: Right;   LOWER EXTREMITY ANGIOGRAPHY Right 05/21/2023   Procedure: Lower Extremity Angiography;  Surgeon: Annice Needy, MD;  Location: [facility];  Service: Cardiovascular;  Laterality: Right;   LOWER EXTREMITY ANGIOGRAPHY Right 09/28/2023   Procedure: Lower Extremity Angiography;  Surgeon: Annice Needy, MD;  Location: [facility];  Service: Cardiovascular;  Laterality: Right;  MECHANICAL THROMBECTOMY WITH AORTOGRAM AND INTERVENTION  05/23/2023   Procedure: MECHANICAL THROMBECTOMY right popliteal artery;  Surgeon: Annice Needy, MD;  Location: [facility];  Service: Vascular;;   STOMACH SURGERY     exploratory   TONSILECTOMY, ADENOIDECTOMY, BILATERAL MYRINGOTOMY AND TUBES     TONSILLECTOMY     TRANSMETATARSAL AMPUTATION Right 06/21/2023   Procedure: TRANSMETATARSAL AMPUTATION;  Surgeon: Rosetta Posner, DPM;  Location: [facility];  Service: Orthopedics/Podiatry;  Laterality: Right;    Family History  Problem Relation Age of Onset   Leukemia Mother    Heart disease Father        a. CABG   Heart disease Sister        a. MVR   Cancer Sister    Cancer Brother    CAD Brother        a. CABG    No Known Allergies     Latest Ref Rng & Units 10/19/2023    5:20 AM 10/18/2023    4:54 AM 10/15/2023    2:43 AM  CBC  WBC 4.0 - 10.5 K/uL 11.2  9.9  14.0   Hemoglobin 13.0 - 17.0 g/dL 9.2  8.9  9.2   Hematocrit 39.0 - 52.0 % 27.4  26.3  27.6   Platelets 150 - 400 K/uL 575  520  468       CMP     Component Value Date/Time   NA 135 10/19/2023 0520   K 4.0 10/19/2023 0520   CL 102 10/19/2023 0520   CO2 23 10/19/2023 0520   GLUCOSE 138 (H) 10/19/2023 0520   BUN 6 (L) 10/19/2023 0520   CREATININE 0.66 10/19/2023 0520   CALCIUM 8.3 (L) 10/19/2023 0520   PROT 6.1 (L) 10/12/2023 0411   ALBUMIN 2.3 (L) 10/12/2023 0411   AST 17 10/12/2023 0411   ALT 10 10/12/2023 0411   ALKPHOS 58 10/12/2023 0411   BILITOT 0.4 10/12/2023 0411   GFRNONAA >60 10/19/2023  0520     No results found.     Assessment & Plan:   1. Neuropathy Will refill patient's gabapentin given his significant neuropathy. - gabapentin (NEURONTIN) 300 MG capsule; Take 1 capsule (300 mg total) by mouth 2 (two) times daily.  Dispense: 60 capsule; Refill: 1  2. S/P AKA (above knee amputation), right (HCC) (Primary) Today the patient's wound is not improving in fact it seems to be worsening.  There is the development of some tunneling through his wound.  It is very mild at this point.  I do have concern about his ability to continue with dressing changes as I suspect this is a factor in his issues with healing currently.  Given the beginning tunneling I think it would be in his best interest to move forward with debridement of the wound with wound VAC placement.  3. Acute lower limb ischemia Given that the patient has been out of his Eliquis, and the sudden development of a painful and cold left lower extremity, I suspect that he has developed limb ischemia.  His previous noninvasive studies showed known occlusion of the SFA as well as decreased TBI's as well.  Given this we will plan on having the patient undergo an angiogram of the left lower extremity.  Will try to get this scheduled as soon as possible however the patient is advised that if he begins to have worsening pain, coolness or discoloration of the limb he should present to the emergency room as soon as possible.   Current  Outpatient Medications on File Prior to Visit  Medication Sig Dispense Refill   aspirin EC 81 MG tablet Take 1 tablet (81 mg total) by mouth daily.     ondansetron (ZOFRAN) 4 MG tablet Take 1 tablet (4 mg total) by mouth every 8 (eight) hours as needed for nausea or vomiting. 20 tablet 0   sulfamethoxazole-trimethoprim (BACTRIM DS) 800-160 MG tablet Take 1 tablet by mouth 2 (two) times daily. 20 tablet 0   No current facility-administered medications on file prior to visit.    There are no Patient  Instructions on file for this visit. No follow-ups on file.   Georgiana Spinner, NP

## 2023-12-26 NOTE — Consult Note (Signed)
 PHARMACY - ANTICOAGULATION CONSULT NOTE  Pharmacy Consult for heparin infusion Indication:  ischemic limb  No Known Allergies  Patient Measurements: Height: 6\' 1"  (185.4 cm) Weight: 77.1 kg (170 lb) IBW/kg (Calculated) : 79.9 Heparin Dosing Weight: 77.1 kg  Vital Signs: Temp: 98.7 F (37.1 C) (03/12 1917) Temp Source: Temporal (03/12 1917) BP: 104/84 (03/12 1917) Pulse Rate: 70 (03/12 1917)  Labs: Recent Labs    12/26/23 1231 12/26/23 1259 12/26/23 1922  HGB 12.2*  --   --   HCT 37.1*  --   --   PLT 324  --   --   APTT 34 35  --   LABPROT  --  13.4  --   INR  --  1.0  --   HEPARINUNFRC 0.44  --  0.48  CREATININE 0.73  --   --     Estimated Creatinine Clearance: 103.1 mL/min (by C-G formula based on SCr of 0.73 mg/dL).   Medical History: Past Medical History:  Diagnosis Date   Anxiety    situational   Asthma    as a child   Atherosclerosis of native arteries of extremity with intermittent claudication (HCC)    Cellulitis and abscess of toe of right foot    Critical limb ischemia of right lower extremity (HCC)    Current smoker    Quit 04/16/23   Peripheral arterial disease (HCC)     Medications:  Patient prescribed Eliquis, per vascular note patient has been out of medication.  Assessment: 63 yo male sent to ED from vascular surgery office due to concern of left lower extremity ischemia.  PMH includes PAD, neuropathy, and recent  AKA on right.  Pharmacy consulted for initiation of heparin infusion.  0312 0800 HL 0.48.   Goal of Therapy:  Heparin level 0.3-0.7 units/ml Monitor platelets by anticoagulation protocol: Yes   Plan:  Heparin level is therapeutic. Will continue heparin infusion at 1250 units/hr. Recheck heparin level and CBC with AM labs.   Ronnald Ramp, PharmD 12/26/2023,8:00 PM

## 2023-12-26 NOTE — Consult Note (Signed)
 Pharmacy Antibiotic Note  Brendan Williams is a 63 y.o. male admitted on 12/26/2023 with LLE ischemia and non-healing wound on right amputation stump.  Pharmacy has been consulted for cefepime and vancomycin dosing.  Previous cultures from 11/20/23 grew E.Coli and Pseudomonas aeruginosa, patient started on Cipro at that time.  Plan: Give vancomycin 1750 mg IV x 1, then start vancomycin 1250 mg IV every 12 hours Estimated AUC 500, Cmin 13.3 77.1 kg, Scr rounded to 0.8, Vd 0.72 Vancomycin levels at steady state or as clinically indicated Start cefepime 2 grams IV every 8 hours Follow renal function and cultures for adjustments  Height: 6\' 1"  (185.4 cm) Weight: 77.1 kg (170 lb) IBW/kg (Calculated) : 79.9  Temp (24hrs), Avg:98.4 F (36.9 C), Min:98.4 F (36.9 C), Max:98.4 F (36.9 C)  Recent Labs  Lab 12/26/23 1231  WBC 9.2  CREATININE 0.73    Estimated Creatinine Clearance: 103.1 mL/min (by C-G formula based on SCr of 0.73 mg/dL).    No Known Allergies  Antimicrobials this admission: vancomycin 3/12 >>  cefepime 3/12 >>   Dose adjustments this admission: N/A  Microbiology results: 3/12 MRSA PCR: ordered  Thank you for allowing pharmacy to be a part of this patient's care.  Barrie Folk, PharmD 12/26/2023 2:51 PM

## 2023-12-26 NOTE — Consult Note (Signed)
 PHARMACY - ANTICOAGULATION CONSULT NOTE  Pharmacy Consult for heparin infusion Indication:  ischemic limb  No Known Allergies  Patient Measurements: Height: 6\' 1"  (185.4 cm) Weight: 77.1 kg (170 lb) IBW/kg (Calculated) : 79.9 Heparin Dosing Weight: 77.1 kg  Vital Signs: Temp: 98.4 F (36.9 C) (03/12 1144) Temp Source: Oral (03/12 1144) BP: 117/84 (03/12 1144) Pulse Rate: 69 (03/12 1144)  Labs: No results for input(s): "HGB", "HCT", "PLT", "APTT", "LABPROT", "INR", "HEPARINUNFRC", "HEPRLOWMOCWT", "CREATININE", "CKTOTAL", "CKMB", "TROPONINIHS" in the last 72 hours.  CrCl cannot be calculated (Patient's most recent lab result is older than the maximum 21 days allowed.).   Medical History: Past Medical History:  Diagnosis Date   Anxiety    situational   Asthma    as a child   Atherosclerosis of native arteries of extremity with intermittent claudication (HCC)    Cellulitis and abscess of toe of right foot    Critical limb ischemia of right lower extremity (HCC)    Current smoker    Quit 04/16/23   Peripheral arterial disease (HCC)     Medications:  Patient prescribed Eliquis, per vascular note patient has been out of medication.  Assessment: 63 yo male sent to ED from vascular surgery office due to concern of left lower extremity ischemia.  PMH includes PAD, neuropathy, and recent  AKA on right.  Pharmacy consulted for initiation of heparin infusion.  Baseline CBC, PT/INR, aPTT, and HL ordered.  Goal of Therapy:  Heparin level 0.3-0.7 units/ml aPTT 66-102 seconds Monitor platelets by anticoagulation protocol: Yes   Plan:  Give heparin 4500 units IV x 1 Start heparin infusion at 1250 units/hr Check aPTT and HL in 6 hours from start of infusion Adjust based on aPTT until correlation with HL Daily CBC while on heparin  Barrie Folk, PharmD 12/26/2023,12:10 PM

## 2023-12-26 NOTE — ED Provider Notes (Addendum)
 [facility] Provider Note    Event Date/Time   First MD Initiated Contact with Patient 12/26/23 1152     (approximate)   History   Leg Pain   HPI  Brendan Williams is a 63 y.o. male with a history of peripheral arterial disease status post right lower extremity amputation, reportedly on Eliquis sent from clinic for cool foot on the left.  Patient reports increasing pain in the left foot.     Physical Exam   Triage Vital Signs: ED Triage Vitals [12/26/23 1144]  Encounter Vitals Group     BP 117/84     Systolic BP Percentile      Diastolic BP Percentile      Pulse Rate 69     Resp 18     Temp 98.4 F (36.9 C)     Temp Source Oral     SpO2 100 %     Weight 77.1 kg (170 lb)     Height 1.854 m (6\' 1" )     Head Circumference      Peak Flow      Pain Score 7     Pain Loc      Pain Education      Exclude from Growth Chart     Most recent vital signs: Vitals:   12/26/23 1144  BP: 117/84  Pulse: 69  Resp: 18  Temp: 98.4 F (36.9 C)  SpO2: 100%     General: Awake, no distress.  CV:  Good peripheral perfusion.  Resp:  Normal effort.  Abd:  No distention.  Other:  Left foot is cool compared to the lower leg, concerning for ischemia   ED Results / Procedures / Treatments   Labs (all labs ordered are listed, but only abnormal results are displayed) Labs Reviewed  CBC - Abnormal; Notable for the following components:      Result Value   Hemoglobin 12.2 (*)    HCT 37.1 (*)    All other components within normal limits  COMPREHENSIVE METABOLIC PANEL  APTT  PROTIME-INR  HEPARIN LEVEL (UNFRACTIONATED)  HEPARIN LEVEL (UNFRACTIONATED)  APTT     EKG     RADIOLOGY     PROCEDURES:  Critical Care performed: yes  CRITICAL CARE Performed by: Jene Every   Total critical care time: 30 minutes  Critical care time was exclusive of separately billable procedures and treating other patients.  Critical care was necessary  to treat or prevent imminent or life-threatening deterioration.  Critical care was time spent personally by me on the following activities: development of treatment plan with patient and/or surrogate as well as nursing, discussions with consultants, evaluation of patient's response to treatment, examination of patient, obtaining history from patient or surrogate, ordering and performing treatments and interventions, ordering and review of laboratory studies, ordering and review of radiographic studies, pulse oximetry and re-evaluation of patient's condition.   Procedures   MEDICATIONS ORDERED IN ED: Medications  heparin bolus via infusion 4,500 Units (has no administration in time range)  heparin ADULT infusion 100 units/mL (25000 units/262mL) (has no administration in time range)     IMPRESSION / MDM / ASSESSMENT AND PLAN / ED COURSE  I reviewed the triage vital signs and the nursing notes. Patient's presentation is most consistent with acute presentation with potential threat to life or bodily function.  Patient presents with cool foot as detailed above in the setting of patient with significant peripheral artery disease concerning for critical limb ischemia.  Morphine and Zofran for pain  Discussed with vascular surgery, they will see the patient, recommend heparin drip, admission to the hospitalist team, likely angio today  Will consult the hospitalist team      FINAL CLINICAL IMPRESSION(S) / ED DIAGNOSES   Final diagnoses:  Lower limb ischemia     Rx / DC Orders   ED Discharge Orders     None        Note:  This document was prepared using Dragon voice recognition software and may include unintentional dictation errors.   Jene Every, MD 12/26/23 1253    Jene Every, MD 12/26/23 1328

## 2023-12-26 NOTE — Consult Note (Signed)
 Hospital Consult    Reason for Consult:  Left Lower Extremity Ischemia Requesting Physician:  Sheppard Plumber NP  MRN #:  [id number]  History of Present Illness: This is a 63 y.o. 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   Mr. Cristobal states that 2 days ago, he was standing in his kitchen when he had sudden onset pain in his left lower leg.  Pain has progressively worsened since then and he has noticed that his leg was very cold.  He denies any other symptoms including nausea, vomiting, diarrhea, chest pain, shortness of breath.  He notes that he has been having wound issues with his right stump and it has been draining but he has not noticed any odor or redness.  Vascular surgery was consulted to evaluate.  Past Medical History:  Diagnosis Date   Anxiety    situational   Asthma    as a child   Atherosclerosis of native arteries of extremity with intermittent claudication (HCC)    Cellulitis and abscess of toe of right foot    Critical limb ischemia of right lower extremity (HCC)    Current smoker    Quit 04/16/23   Peripheral arterial disease (HCC)     Past Surgical History:  Procedure Laterality Date   AMPUTATION Right 10/12/2023   Procedure: AMPUTATION ABOVE KNEE;  Surgeon: Annice Needy, MD;  Location: [facility];  Service: General;  Laterality: Right;   AMPUTATION TOE Right 12/02/2019   Procedure: AMPUTATION RIGHT 5TH TOE;  Surgeon: Rosetta Posner, DPM;  Location: [facility];  Service: Podiatry;  Laterality: Right;   APPLICATION OF WOUND VAC Bilateral 11/28/2019   Procedure: APPLICATION OF WOUND VAC;  Surgeon: Annice Needy, MD;  Location: [facility];  Service: Vascular;  Laterality: Bilateral;  Prevena    BACK SURGERY     lower ruptured disk   CENTRAL VENOUS CATHETER INSERTION  11/28/2019   Procedure: INSERTION CENTRAL LINE ADULT;  Surgeon: Annice Needy, MD;   Location: [facility];  Service: Vascular;;   ENDARTERECTOMY FEMORAL Bilateral 11/28/2019   Procedure: ENDARTERECTOMY FEMORAL;  Surgeon: Annice Needy, MD;  Location: [facility];  Service: Vascular;  Laterality: Bilateral;   ENDARTERECTOMY FEMORAL Right 05/23/2023   Procedure: ENDARTERECTOMY FEMORAL REDO;  Surgeon: Annice Needy, MD;  Location: [facility];  Service: Vascular;  Laterality: Right;   ENDOVASCULAR REPAIR/STENT GRAFT Right 05/23/2023   Procedure: ENDOVASCULAR REPAIR/STENT GRAFT;  Surgeon: Annice Needy, MD;  Location: [facility];  Service: Vascular;  Laterality: Right;  Right SFA Stents   LOWER EXTREMITY ANGIOGRAPHY Right 11/14/2019   Procedure: LOWER EXTREMITY ANGIOGRAPHY;  Surgeon: Annice Needy, MD;  Location: [facility];  Service: Cardiovascular;  Laterality: Right;   LOWER EXTREMITY ANGIOGRAPHY Right 12/19/2021   Procedure: Lower Extremity Angiography;  Surgeon: Annice Needy, MD;  Location: [facility];  Service: Cardiovascular;  Laterality: Right;   LOWER EXTREMITY ANGIOGRAPHY Right 01/30/2022   Procedure: Lower Extremity Angiography;  Surgeon: Annice Needy, MD;  Location: [facility];  Service: Cardiovascular;  Laterality: Right;   LOWER EXTREMITY ANGIOGRAPHY Right 05/21/2023   Procedure: Lower Extremity Angiography;  Surgeon: Annice Needy, MD;  Location: [facility];  Service: Cardiovascular;  Laterality: Right;   LOWER EXTREMITY ANGIOGRAPHY Right 09/28/2023   Procedure: Lower Extremity Angiography;  Surgeon: Annice Needy, MD;  Location:  [facility];  Service: Cardiovascular;  Laterality: Right;   MECHANICAL THROMBECTOMY WITH AORTOGRAM AND INTERVENTION  05/23/2023   Procedure: MECHANICAL THROMBECTOMY right popliteal artery;  Surgeon: Annice Needy, MD;  Location: [facility];  Service: Vascular;;   STOMACH SURGERY     exploratory   TONSILECTOMY, ADENOIDECTOMY, BILATERAL MYRINGOTOMY AND TUBES     TONSILLECTOMY     TRANSMETATARSAL AMPUTATION Right  06/21/2023   Procedure: TRANSMETATARSAL AMPUTATION;  Surgeon: Rosetta Posner, DPM;  Location: [facility];  Service: Orthopedics/Podiatry;  Laterality: Right;    No Known Allergies  Prior to Admission medications   Medication Sig Start Date End Date Taking? Authorizing Provider  apixaban (ELIQUIS) 5 MG TABS tablet Take 1 tablet (5 mg total) by mouth 2 (two) times daily. 12/25/23 02/23/24  Georgiana Spinner, NP  aspirin EC 81 MG tablet Take 1 tablet (81 mg total) by mouth daily. 06/21/23   Rosetta Posner, DPM  atorvastatin (LIPITOR) 40 MG tablet Take 1 tablet (40 mg total) by mouth daily. 12/25/23 02/23/24  Georgiana Spinner, NP  busPIRone (BUSPAR) 5 MG tablet Take 1 tablet (5 mg total) by mouth 3 (three) times daily as needed (anxiety). 12/25/23   Georgiana Spinner, NP  ciprofloxacin (CIPRO) 250 MG tablet Take 3 tablets (750 mg total) by mouth 2 (two) times daily. 12/25/23   Georgiana Spinner, NP  dexlansoprazole (DEXILANT) 60 MG capsule Take 1 capsule (60 mg total) by mouth as needed. 12/25/23   Georgiana Spinner, NP  gabapentin (NEURONTIN) 300 MG capsule Take 1 capsule (300 mg total) by mouth 2 (two) times daily. 12/25/23 02/23/24  Georgiana Spinner, NP  ondansetron (ZOFRAN) 4 MG tablet Take 1 tablet (4 mg total) by mouth every 8 (eight) hours as needed for nausea or vomiting. 06/21/23   Rosetta Posner, DPM  oxyCODONE-acetaminophen (PERCOCET) 7.5-325 MG tablet Take 1 tablet by mouth every 4 (four) hours as needed for severe pain (pain score 7-10). 12/25/23 12/24/24  Georgiana Spinner, NP  sulfamethoxazole-trimethoprim (BACTRIM DS) 800-160 MG tablet Take 1 tablet by mouth 2 (two) times daily. 11/21/23   Georgiana Spinner, NP    Social History   Socioeconomic History   Marital status: Married    Spouse name: Lovins,BRENDA (Spouse)   Number of children: Not on file   Years of education: Not on file   Highest education level: Not on file  Occupational History   Occupation: gutter work  Tobacco Use   Smoking status:  Former    Current packs/day: 0.00    Average packs/day: 1 pack/day for 40.5 years (40.5 ttl pk-yrs)    Types: Cigarettes    Start date: 56    Quit date: 04/16/2023    Years since quitting: 0.6   Smokeless tobacco: Never   Tobacco comments:    Last smoked 3-4 days ago  Vaping Use   Vaping status: Never Used  Substance and Sexual Activity   Alcohol use: Yes    Alcohol/week: 5.0 standard drinks of alcohol    Types: 5 Cans of beer per week    Comment: 4-5 beers a week   Drug use: Yes    Frequency: 7.0 times per week    Types: Marijuana    Comment: a joint daily   Sexual activity: Yes  Other Topics Concern   Not on file  Social History Narrative   Live in private residence with wife, Steward Drone. Had grandson and step daughter in the home   Social Drivers  of Health   Financial Resource Strain: Not on file  Food Insecurity: No Food Insecurity (10/12/2023)   Hunger Vital Sign    Worried About Running Out of Food in the Last Year: Never true    Ran Out of Food in the Last Year: Never true  Transportation Needs: No Transportation Needs (10/12/2023)   PRAPARE - Administrator, Civil Service (Medical): No    Lack of Transportation (Non-Medical): No  Physical Activity: Not on file  Stress: Not on file  Social Connections: Not on file  Intimate Partner Violence: Not At Risk (10/12/2023)   Humiliation, Afraid, Rape, and Kick questionnaire    Fear of Current or Ex-Partner: No    Emotionally Abused: No    Physically Abused: No    Sexually Abused: No     Family History  Problem Relation Age of Onset   Leukemia Mother    Heart disease Father        a. CABG   Heart disease Sister        a. MVR   Cancer Sister    Cancer Brother    CAD Brother        a. CABG    ROS: Otherwise negative unless mentioned in HPI  Physical Examination  Vitals:   12/26/23 1144  BP: 117/84  Pulse: 69  Resp: 18  Temp: 98.4 F (36.9 C)  SpO2: 100%   Body mass index is 22.43  kg/m.  General:  WDWN in NAD Gait: Not observed HENT: WNL, normocephalic Pulmonary: normal non-labored breathing, without Rales, rhonchi,  wheezing Cardiac: regular, without  Murmurs, rubs or gallops; without carotid bruits Abdomen: Positive bowel sounds throughout,  soft, NT/ND, no masses Skin: without rashes Vascular Exam/Pulses: Right BKA with open draining wound, left lower extremity with cold calf to foot, mottled toes, no Doppler popliteal, dorsalis pedis, or posttibial pulses. Extremities: with ischemic changes, without Gangrene , with cellulitis; with open wounds;  Musculoskeletal: no muscle wasting or atrophy  Neurologic: A&O X 3;  No focal weakness or paresthesias are detected; speech is fluent/normal Psychiatric:  The pt has Normal affect. Lymph:  Unremarkable  CBC    Component Value Date/Time   WBC 11.2 (H) 10/19/2023 0520   RBC 3.19 (L) 10/19/2023 0520   HGB 9.2 (L) 10/19/2023 0520   HCT 27.4 (L) 10/19/2023 0520   PLT 575 (H) 10/19/2023 0520   MCV 85.9 10/19/2023 0520   MCH 28.8 10/19/2023 0520   MCHC 33.6 10/19/2023 0520   RDW 15.6 (H) 10/19/2023 0520   LYMPHSABS 1.7 10/19/2023 0520   MONOABS 1.2 (H) 10/19/2023 0520   EOSABS 0.0 10/19/2023 0520   BASOSABS 0.0 10/19/2023 0520    BMET    Component Value Date/Time   NA 135 10/19/2023 0520   K 4.0 10/19/2023 0520   CL 102 10/19/2023 0520   CO2 23 10/19/2023 0520   GLUCOSE 138 (H) 10/19/2023 0520   BUN 6 (L) 10/19/2023 0520   CREATININE 0.66 10/19/2023 0520   CALCIUM 8.3 (L) 10/19/2023 0520   GFRNONAA >60 10/19/2023 0520   GFRAA >60 12/04/2019 0523    COAGS: Lab Results  Component Value Date   INR 1.1 10/11/2023   INR 1.0 10/03/2023   INR 1.1 09/27/2023     Non-Invasive Vascular Imaging:   NONE Ordered   Statin:  Yes.   Beta Blocker:  No. Aspirin:  Yes.   ACEI:  No. ARB:  No. CCB use:  No Other antiplatelets/anticoagulants:  Yes.  Eliquis 5 mg BID    ASSESSMENT/PLAN: This is a 63 y.o.  male who presents to [facility] emergency department with left lower extremity leg pain.  Upon evaluation patient is noted to have cold left foot and calf.  He does not have any Doppler pulses of his left lower extremity which include popliteal, dorsalis pedis and posttibial pulses.  Patient was started on a heparin infusion in the emergency department as well as antibiotics.  Vascular surgery plans on taking the patient to the vascular lab tomorrow on 12/27/2023 for a left lower extremity angiogram with possible intervention.  I discussed with the patient in detail at the bedside in the emergency department the procedure, benefits, risk, and complications.  Patient verbalizes understanding and wishes to proceed to soon as possible.  Patient endorses he has had multiple angiograms in the past and knows what to expect.  Patient will be made n.p.o. after midnight tonight for procedure tomorrow.   -Discussed the case in detail with Dr. Festus Barren MD and he agrees with plan.   [name] [facility] 12/26/2023 12:09 PM

## 2023-12-26 NOTE — ED Notes (Signed)
 Pt advised that the IV site is no longer itchy and the warmth at the site has went away. There is still slight redness to the area.

## 2023-12-26 NOTE — ED Notes (Signed)
 Per Dr. Leonard Schwartz. " Lets hold the vanc at this time. We will give pt another med to cover for MRSA until swab comes back. Give benadryl if needed."

## 2023-12-26 NOTE — ED Triage Notes (Addendum)
 Patient to ED via POV for left leg pain. Has hx of PAD- had right leg amputated a few months ago. Has multiple stents in left leg for same. Sent by Dr Driscilla Grammes office for possible occlusion.

## 2023-12-26 NOTE — Assessment & Plan Note (Signed)
 Extensive PAD requiring multiple angiographies and subsequent right AKA in December 2024.  He has only missed 1 day of Eliquis in the last week but reports otherwise good compliance.  He is no longer smoking.  - Management of critical limb ischemia as noted above

## 2023-12-26 NOTE — Assessment & Plan Note (Signed)
-

## 2023-12-26 NOTE — ED Notes (Signed)
 Pt called RN into room. Pt has what appers to have a localized infection to the vancomycin. Pt sts that the site is very ithcy. Pt does have warmth and redness to the area. Pt does not have any SOB, hives or rash on any part of the body. RN called and notified Dr. B of reaction. She sts " Let me call pharmacy to see what the next course will be."

## 2023-12-27 ENCOUNTER — Inpatient Hospital Stay: Admitting: Anesthesiology

## 2023-12-27 ENCOUNTER — Encounter
Admission: EM | Disposition: A | Discharge status: Home Health | Payer: Self-pay | Source: Home / Self Care | Attending: Student

## 2023-12-27 DIAGNOSIS — I743 Embolism and thrombosis of arteries of the lower extremities: Secondary | ICD-10-CM | POA: Diagnosis not present

## 2023-12-27 DIAGNOSIS — T82868A Thrombosis of vascular prosthetic devices, implants and grafts, initial encounter: Secondary | ICD-10-CM | POA: Diagnosis not present

## 2023-12-27 DIAGNOSIS — I70222 Atherosclerosis of native arteries of extremities with rest pain, left leg: Secondary | ICD-10-CM | POA: Diagnosis not present

## 2023-12-27 DIAGNOSIS — T82856A Stenosis of peripheral vascular stent, initial encounter: Secondary | ICD-10-CM | POA: Diagnosis not present

## 2023-12-27 DIAGNOSIS — I700 Atherosclerosis of aorta: Secondary | ICD-10-CM | POA: Diagnosis not present

## 2023-12-27 DIAGNOSIS — I7409 Other arterial embolism and thrombosis of abdominal aorta: Secondary | ICD-10-CM

## 2023-12-27 HISTORY — PX: LOWER EXTREMITY ANGIOGRAPHY: CATH118251

## 2023-12-27 LAB — BASIC METABOLIC PANEL
Anion gap: 11 (ref 5–15)
BUN: 10 mg/dL (ref 8–23)
CO2: 21 mmol/L — ABNORMAL LOW (ref 22–32)
Calcium: 8.7 mg/dL — ABNORMAL LOW (ref 8.9–10.3)
Chloride: 107 mmol/L (ref 98–111)
Creatinine, Ser: 0.77 mg/dL (ref 0.61–1.24)
GFR, Estimated: >60 mL/min (ref >60)
Glucose, Bld: 181 mg/dL — ABNORMAL HIGH (ref 70–99)
Potassium: 3.7 mmol/L (ref 3.5–5.1)
Sodium: 139 mmol/L (ref 135–145)

## 2023-12-27 LAB — CBC
HCT: 31.8 % — ABNORMAL LOW (ref 39.0–52.0)
Hemoglobin: 10.5 g/dL — ABNORMAL LOW (ref 13.0–17.0)
MCH: 29.7 pg (ref 26.0–34.0)
MCHC: 33 g/dL (ref 30.0–36.0)
MCV: 90.1 fL (ref 80.0–100.0)
Platelets: 272 10*3/uL (ref 150–400)
RBC: 3.53 MIL/uL — ABNORMAL LOW (ref 4.22–5.81)
RDW: 15.2 % (ref 11.5–15.5)
WBC: 7.9 10*3/uL (ref 4.0–10.5)
nRBC: 0 % (ref 0.0–0.2)

## 2023-12-27 LAB — HEPARIN LEVEL (UNFRACTIONATED)
Heparin Unfractionated: 0.34 [IU]/mL (ref 0.30–0.70)
Heparin Unfractionated: 0.44 [IU]/mL (ref 0.30–0.70)

## 2023-12-27 SURGERY — LOWER EXTREMITY ANGIOGRAPHY
Anesthesia: General | Laterality: Left

## 2023-12-27 MED ORDER — FENTANYL CITRATE (PF) 100 MCG/2ML IJ SOLN
25.0000 ug | INTRAMUSCULAR | Status: DC | PRN
  Administered 2023-12-27 (×2): 50 ug via INTRAVENOUS

## 2023-12-27 MED ORDER — HYDROMORPHONE HCL 1 MG/ML IJ SOLN
INTRAMUSCULAR | Status: AC
  Filled 2023-12-27: qty 1

## 2023-12-27 MED ORDER — KETAMINE HCL 50 MG/5ML IJ SOSY
PREFILLED_SYRINGE | INTRAMUSCULAR | Status: DC | PRN
  Administered 2023-12-27: 30 mg via INTRAVENOUS

## 2023-12-27 MED ORDER — MIDAZOLAM HCL 2 MG/ML PO SYRP: 8.0000 mg | ORAL_SOLUTION | Freq: Once | ORAL | Status: DC | PRN

## 2023-12-27 MED ORDER — OXYCODONE HCL 5 MG PO TABS
ORAL_TABLET | ORAL | Status: AC
  Filled 2023-12-27: qty 1

## 2023-12-27 MED ORDER — SODIUM CHLORIDE 0.9 % IV SOLN
1.0000 g | Freq: Three times a day (TID) | INTRAVENOUS | Status: DC
  Administered 2023-12-27 – 2024-01-01 (×14): 1 g via INTRAVENOUS
  Filled 2023-12-27 (×17): qty 20

## 2023-12-27 MED ORDER — CEFAZOLIN SODIUM-DEXTROSE 2-4 GM/100ML-% IV SOLN
2.0000 g | INTRAVENOUS | Status: AC
  Administered 2023-12-27: 2 g via INTRAVENOUS

## 2023-12-27 MED ORDER — FENTANYL CITRATE (PF) 100 MCG/2ML IJ SOLN
INTRAMUSCULAR | Status: DC | PRN
  Administered 2023-12-27 (×2): 50 ug via INTRAVENOUS

## 2023-12-27 MED ORDER — ONDANSETRON HCL 4 MG/2ML IJ SOLN
INTRAMUSCULAR | Status: AC
Start: 2023-12-27 — End: ?
  Filled 2023-12-27: qty 2

## 2023-12-27 MED ORDER — PHENYLEPHRINE HCL-NACL 20-0.9 MG/250ML-% IV SOLN
INTRAVENOUS | Status: DC | PRN
  Administered 2023-12-27: 40 ug/min via INTRAVENOUS

## 2023-12-27 MED ORDER — DIPHENHYDRAMINE HCL 50 MG/ML IJ SOLN: 50.0000 mg | Freq: Once | INTRAMUSCULAR | Status: DC | PRN

## 2023-12-27 MED ORDER — FAMOTIDINE 20 MG PO TABS: 40.0000 mg | ORAL_TABLET | Freq: Once | ORAL | Status: DC | PRN

## 2023-12-27 MED ORDER — ONDANSETRON HCL 4 MG/2ML IJ SOLN
4.0000 mg | Freq: Once | INTRAMUSCULAR | Status: AC
  Administered 2023-12-27: 4 mg via INTRAVENOUS

## 2023-12-27 MED ORDER — EPHEDRINE SULFATE-NACL 50-0.9 MG/10ML-% IV SOSY
PREFILLED_SYRINGE | INTRAVENOUS | Status: DC | PRN
Start: 2023-12-27 — End: 2023-12-27
  Administered 2023-12-27: 10 mg via INTRAVENOUS
  Administered 2023-12-27: 5 mg via INTRAVENOUS

## 2023-12-27 MED ORDER — FENTANYL CITRATE (PF) 100 MCG/2ML IJ SOLN
INTRAMUSCULAR | Status: AC
  Filled 2023-12-27: qty 2

## 2023-12-27 MED ORDER — DEXAMETHASONE SODIUM PHOSPHATE 10 MG/ML IJ SOLN
INTRAMUSCULAR | Status: AC
  Filled 2023-12-27: qty 1

## 2023-12-27 MED ORDER — SUCCINYLCHOLINE CHLORIDE 200 MG/10ML IV SOSY
PREFILLED_SYRINGE | INTRAVENOUS | Status: DC | PRN
  Administered 2023-12-27: 20 mg via INTRAVENOUS

## 2023-12-27 MED ORDER — ACETAMINOPHEN 10 MG/ML IV SOLN
INTRAVENOUS | Status: AC
  Filled 2023-12-27: qty 100

## 2023-12-27 MED ORDER — CEFAZOLIN SODIUM-DEXTROSE 2-4 GM/100ML-% IV SOLN
INTRAVENOUS | Status: AC
Start: 2023-12-27 — End: ?
  Filled 2023-12-27: qty 100

## 2023-12-27 MED ORDER — HYDROMORPHONE HCL 1 MG/ML IJ SOLN
0.5000 mg | INTRAMUSCULAR | Status: AC | PRN
  Administered 2023-12-27 (×4): 0.5 mg via INTRAVENOUS

## 2023-12-27 MED ORDER — PROPOFOL 10 MG/ML IV BOLUS
INTRAVENOUS | Status: AC
  Filled 2023-12-27: qty 20

## 2023-12-27 MED ORDER — ONDANSETRON HCL 4 MG/2ML IJ SOLN
INTRAMUSCULAR | Status: AC
  Filled 2023-12-27: qty 2

## 2023-12-27 MED ORDER — HEPARIN (PORCINE) IN NACL 1000-0.9 UT/500ML-% IV SOLN
INTRAVENOUS | Status: DC | PRN
  Administered 2023-12-27: 1000 mL

## 2023-12-27 MED ORDER — IODIXANOL 320 MG/ML IV SOLN
INTRAVENOUS | Status: DC | PRN
  Administered 2023-12-27: 55 mL

## 2023-12-27 MED ORDER — LIDOCAINE HCL (CARDIAC) PF 100 MG/5ML IV SOSY
PREFILLED_SYRINGE | INTRAVENOUS | Status: DC | PRN
  Administered 2023-12-27: 50 mg via INTRAVENOUS

## 2023-12-27 MED ORDER — PHENYLEPHRINE HCL-NACL 20-0.9 MG/250ML-% IV SOLN
INTRAVENOUS | Status: AC
  Filled 2023-12-27: qty 250

## 2023-12-27 MED ORDER — MIDAZOLAM HCL 2 MG/2ML IJ SOLN
INTRAMUSCULAR | Status: AC
  Filled 2023-12-27: qty 2

## 2023-12-27 MED ORDER — FENTANYL CITRATE PF 50 MCG/ML IJ SOSY: 12.5000 ug | PREFILLED_SYRINGE | Freq: Once | INTRAMUSCULAR | Status: DC | PRN

## 2023-12-27 MED ORDER — KETAMINE HCL 50 MG/5ML IJ SOSY
PREFILLED_SYRINGE | INTRAMUSCULAR | Status: AC
  Filled 2023-12-27: qty 5

## 2023-12-27 MED ORDER — PROPOFOL 10 MG/ML IV BOLUS
INTRAVENOUS | Status: DC | PRN
  Administered 2023-12-27: 20 mg via INTRAVENOUS
  Administered 2023-12-27: 50 mg via INTRAVENOUS
  Administered 2023-12-27: 30 mg via INTRAVENOUS
  Administered 2023-12-27: 120 mg via INTRAVENOUS

## 2023-12-27 MED ORDER — CHLORHEXIDINE GLUCONATE CLOTH 2 % EX PADS
6.0000 | MEDICATED_PAD | Freq: Every day | CUTANEOUS | Status: AC
Start: 2023-12-28 — End: ?
  Administered 2023-12-28 – 2024-01-01 (×5): 6 via TOPICAL

## 2023-12-27 MED ORDER — DEXAMETHASONE SODIUM PHOSPHATE 10 MG/ML IJ SOLN
INTRAMUSCULAR | Status: DC | PRN
  Administered 2023-12-27: 5 mg via INTRAVENOUS

## 2023-12-27 MED ORDER — ACETAMINOPHEN 10 MG/ML IV SOLN
1000.0000 mg | Freq: Once | INTRAVENOUS | Status: DC | PRN
  Administered 2023-12-27: 1000 mg via INTRAVENOUS

## 2023-12-27 MED ORDER — SEVOFLURANE IN SOLN
RESPIRATORY_TRACT | Status: AC
  Filled 2023-12-27: qty 250

## 2023-12-27 MED ORDER — METHYLPREDNISOLONE SODIUM SUCC 125 MG IJ SOLR: 125.0000 mg | Freq: Once | INTRAMUSCULAR | Status: DC | PRN

## 2023-12-27 MED ORDER — PHENYLEPHRINE HCL (PRESSORS) 10 MG/ML IV SOLN
INTRAVENOUS | Status: AC
  Filled 2023-12-27: qty 1

## 2023-12-27 MED ORDER — LACTATED RINGERS IV SOLN: INTRAVENOUS | Status: DC

## 2023-12-27 MED ORDER — SODIUM CHLORIDE 0.9 % IV SOLN: INTRAVENOUS | Status: DC

## 2023-12-27 MED ORDER — LIDOCAINE HCL (PF) 2 % IJ SOLN
INTRAMUSCULAR | Status: AC
  Filled 2023-12-27: qty 5

## 2023-12-27 MED ORDER — HEPARIN SODIUM (PORCINE) 1000 UNIT/ML IJ SOLN
INTRAMUSCULAR | Status: DC | PRN
  Administered 2023-12-27: 2000 [IU] via INTRAVENOUS
  Administered 2023-12-27: 5000 [IU] via INTRAVENOUS

## 2023-12-27 MED ORDER — MIDAZOLAM HCL 2 MG/2ML IJ SOLN
INTRAMUSCULAR | Status: DC | PRN
  Administered 2023-12-27: 1 mg via INTRAVENOUS

## 2023-12-27 SURGICAL SUPPLY — 55 items
BALLN ARMADA 35LL 12X80X135 (BALLOONS) ×1 IMPLANT
BALLN LUTONIX 7X220X130 (BALLOONS) ×1 IMPLANT
BALLN LUTONIX DCB 5X80X130 (BALLOONS) ×1 IMPLANT
BALLOON ARMADA 35LL 12X80X135 (BALLOONS) IMPLANT
BALLOON LUTONIX 7X220X130 (BALLOONS) IMPLANT
BALLOON LUTONIX DCB 5X80X130 (BALLOONS) IMPLANT
BLADE SURG SZ11 CARB STEEL (BLADE) IMPLANT
BRUSH SCRUB EZ 4% CHG (MISCELLANEOUS) IMPLANT
CANISTER PENUMBRA ENGINE (MISCELLANEOUS) IMPLANT
CATH ANGIO 5F PIGTAIL 100CM (CATHETERS) IMPLANT
CATH CXI SUPP ANG 4FR 135 (CATHETERS) IMPLANT
CATH CXI SUPP ANG 4FR 135CM (CATHETERS) ×1 IMPLANT
CATH LIGHTNING BOLT 7 130 (CATHETERS) IMPLANT
CHLORAPREP W/TINT 26 (MISCELLANEOUS) IMPLANT
DERMABOND ADVANCED .7 DNX12 (GAUZE/BANDAGES/DRESSINGS) IMPLANT
DEVICE PRESTO INFLATION (MISCELLANEOUS) IMPLANT
DRAPE BRACHIAL (DRAPES) IMPLANT
DRESSING SURGICEL FIBRLLR 1X2 (HEMOSTASIS) IMPLANT
DRSG SURGICEL FIBRILLAR 1X2 (HEMOSTASIS) ×1 IMPLANT
ELECT REM PT RETURN 9FT ADLT (ELECTROSURGICAL) ×1 IMPLANT
ELECTRODE REM PT RTRN 9FT ADLT (ELECTROSURGICAL) IMPLANT
GLIDEWIRE ADV .035X260CM (WIRE) IMPLANT
GLOVE BIO SURGEON STRL SZ7 (GLOVE) IMPLANT
GLOVE SURG SYN 8.0 (GLOVE) ×1 IMPLANT
GLOVE SURG SYN 8.0 PF PI (GLOVE) IMPLANT
GOWN STRL REUS W/ TWL LRG LVL3 (GOWN DISPOSABLE) IMPLANT
GOWN STRL REUS W/ TWL XL LVL3 (GOWN DISPOSABLE) IMPLANT
GOWN STRL REUS W/TWL 2XL LVL3 (GOWN DISPOSABLE) IMPLANT
HANDLE YANKAUER SUCT BULB TIP (MISCELLANEOUS) IMPLANT
LOOP VESSEL MAXI 1X406 RED (MISCELLANEOUS) IMPLANT
LOOP VESSEL MINI 0.8X406 BLUE (MISCELLANEOUS) IMPLANT
PACK ANGIOGRAPHY (CUSTOM PROCEDURE TRAY) ×1 IMPLANT
PACK BASIN MAJOR ARMC (MISCELLANEOUS) IMPLANT
SHEATH BRITE TIP 6FRX11 (SHEATH) IMPLANT
SHEATH DESTINIATION 65 8FR (SHEATH) IMPLANT
SHEATH HALO 035 5FRX10 (SHEATH) IMPLANT
SHEATH PINNACLE ST 7F 65CM (SHEATH) IMPLANT
SHEATH SHUTTLE 6FR (SHEATH) IMPLANT
STENT VIABAHN 11X79X135 (Permanent Stent) IMPLANT
STENT VIABAHN 9X15X120 (Permanent Stent) IMPLANT
SUT MNCRL 4-0 27 PS-2 XMFL (SUTURE) ×1 IMPLANT
SUT PROLENE 6 0 BV (SUTURE) IMPLANT
SUT SILK 2-0 18XBRD TIE 12 (SUTURE) IMPLANT
SUT SILK 3-0 18XBRD TIE 12 (SUTURE) IMPLANT
SUT SILK 4-0 18XBRD TIE 12 (SUTURE) IMPLANT
SUT VIC AB 2-0 CT1 (SUTURE) IMPLANT
SUT VICRYL+ 3-0 36IN CT-1 (SUTURE) IMPLANT
SUTURE MNCRL 4-0 27XMF (SUTURE) IMPLANT
SYR BULB IRRIG 60ML STRL (SYRINGE) IMPLANT
TRAY FOLEY SLVR 16FR LF STAT (SET/KITS/TRAYS/PACK) IMPLANT
TUBING CONNECTING 10 (TUBING) IMPLANT
TUBING CONTRAST HIGH PRESS 72 (TUBING) IMPLANT
VALVE CHECKFLO PERFORMER (SHEATH) IMPLANT
VALVE HEMO TOUHY BORST Y (ADAPTER) IMPLANT
WIRE G V18X300CM (WIRE) IMPLANT

## 2023-12-27 NOTE — Anesthesia Preprocedure Evaluation (Addendum)
 Anesthesia Evaluation  Patient identified by MRN, date of birth, ID band Patient awake    Reviewed: Allergy & Precautions, NPO status , Patient's Chart, lab work & pertinent test results  History of Anesthesia Complications Negative for: history of anesthetic complications  Airway Mallampati: I   Neck ROM: Full    Dental  (+) Poor Dentition   Pulmonary asthma (childhood) , former smoker (quit 04/2023)   Pulmonary exam normal breath sounds clear to auscultation       Cardiovascular + Peripheral Vascular Disease (s/p LE stents and AKA on Eliquis)  Normal cardiovascular exam Rhythm:Regular Rate:Normal  ECG 10/15/23: normal   Neuro/Psych  PSYCHIATRIC DISORDERS Anxiety      Neuromuscular disease (neuropathy)    GI/Hepatic negative GI ROS,,,  Endo/Other  negative endocrine ROS    Renal/GU negative Renal ROS     Musculoskeletal   Abdominal   Peds  Hematology  (+) Blood dyscrasia, anemia   Anesthesia Other Findings   Reproductive/Obstetrics                             Anesthesia Physical Anesthesia Plan  ASA: 3  Anesthesia Plan: General   Post-op Pain Management:    Induction: Intravenous  PONV Risk Score and Plan: 2 and Ondansetron, Dexamethasone and Treatment may vary due to age or medical condition  Airway Management Planned: LMA  Additional Equipment:   Intra-op Plan:   Post-operative Plan: Extubation in OR  Informed Consent: I have reviewed the patients History and Physical, chart, labs and discussed the procedure including the risks, benefits and alternatives for the proposed anesthesia with the patient or authorized representative who has indicated his/her understanding and acceptance.     Dental advisory given  Plan Discussed with: CRNA  Anesthesia Plan Comments: (Patient consented for risks of anesthesia including but not limited to:  - adverse reactions to  medications - damage to eyes, teeth, lips or other oral mucosa - nerve damage due to positioning  - sore throat or hoarseness - damage to heart, brain, nerves, lungs, other parts of body or loss of life  Informed patient about role of CRNA in peri- and intra-operative care.  Patient voiced understanding.)        Anesthesia Quick Evaluation

## 2023-12-27 NOTE — Progress Notes (Signed)
 Pt in extreme pain in LLE, Dr. Wyn Quaker notified. No pedal or tibial pulses noted with doppler, Dr. Wyn Quaker aware, femoral pulse strong. Transferred to room

## 2023-12-27 NOTE — Op Note (Signed)
 Brendan Williams  Percutaneous Study/Intervention Procedural Note   Date of Surgery: 12/27/2023  Surgeon(s):Braxtin Bamba    Assistants:none  Pre-operative Diagnosis: PAD with rest pain left lower extremity, acute on chronic ischemia  Post-operative diagnosis:  Same  Procedure(s) Performed:             1.  Left brachial artery cutdown for access             2.  Catheter placement into left SFA and profunda femoris arteries from left brachial approach             3.  Aortogram and selective left lower extremity angiogram             4.  Mechanical thrombectomy using the penumbra 7 French bolt device to the aorta, left common and external iliac arteries, left common femoral and profunda femoris arteries             5.  Stent placement to the left external iliac artery with 9 mm diameter by 15 cm length Viabahn stent  6.  Stent placement to the aorta and most proximal common iliac artery with 11 mm diameter by 79 mm length covered stent postdilated to 12 mm proximally  7.  Angioplasty of the left profunda femoris artery with 5 mm diameter by 8 cm length Lutonix drug-coated angioplasty balloon               EBL: 300 cc  Contrast: 55 cc  Fluoro Time: 18.9 minutes  Anesthesia: General              Indications:  Patient is a 63 y.o.male with an ischemic left lower extremity with profound ischemia and a long history of severe peripheral arterial disease. The patient has noninvasive study showing thrombosis of his previous interventions with essentially no flow distally. The patient is brought in for angiography for further evaluation and potential treatment.  Due to the limb threatening nature of the situation, angiogram was performed for attempted limb salvage. The patient is aware that if the procedure fails, amputation would be expected.  The patient also understands that even with successful revascularization, amputation may still be required due to the severity of the  situation.  Risks and benefits are discussed and informed consent is obtained.   Procedure:  The patient was identified and appropriate procedural time out was performed.  The patient was then placed supine on the table and prepped and draped in the usual sterile fashion.  I then began by making an oblique incision just above the antecubital fossa.  The brachial artery was dissected out and encircled with Vesseloops proximally and distally.  It was then accessed under direct visualization with a Seldinger needle and a J-wire was placed.  I started with a 6 French sheath.  An advantage wire and pigtail catheter was then advanced through the left upper extremity and down into the descending thoracic aorta and then parked at the L1 level.  Pigtail catheter was placed into the aorta and an AP aortogram was performed. This demonstrated normal renal arteries.  There was occlusion of the distal aorta above the previously placed stents.  The right common and external iliac artery stents were occluded and this was more chronic.  There was thrombosis of the left common and external iliac artery stents.  There was reconstitution through collaterals to the profunda femoris artery and the common femoral artery with significant thrombus burden in these vessels.  The SFA was occluded.  It was extremely difficult to opacify distally initially.  The patient was systemically heparinized and the long 6 French sheath was then placed.  I then used a CXI catheter and advantage wire to cross the thrombotic occlusion in the distal aorta, iliac arteries, and get down into the common femoral artery.  Selective left lower extremity angiogram was then performed. This demonstrated thrombus in the common femoral artery and profunda femoris artery and occlusion of the SFA.  There was reconstitution of the mid popliteal artery with what appeared to be a peroneal artery and potentially a posterior tibial artery distally although the flow was  quite sluggish and difficult to opacify. It was felt that it was in the patient's best interest to proceed with intervention after these images to avoid a second procedure and a larger amount of contrast and fluoroscopy based off of the findings from the initial angiogram.  I then upsized first to a 7 [place] sheath and later to an 8 [place] 65 cm sheath.  After exchanging for a V18 wire that was parked in the profunda femoris artery, mechanical thrombectomy was performed with the penumbra 7 bolt catheter.  Initially, 3 passes were made throughout the distal aorta, common iliac artery, and external iliac artery and then into the common femoral artery and profunda femoris artery.  Large amounts of thrombus were removed.  This uncovered high-grade residual stenosis at the distal external iliac artery just at the bottom of the previously placed stents.  There was also a very high-grade stenosis at the distal aorta at the top of the previously placed left common iliac artery stent.  After exchanging for the 8 French sheath, a 9 mm diameter by 15 cm length Viabahn stent was deployed throughout the left external iliac artery down to the top of the femoral head.  This came up into the mid to distal common iliac artery.  To treat the lesion in the distal aorta, an 11 mm diameter by 79 mm length VBX stent was taken about 3 to 4 cm in the aorta and then brought down to bridge to the Viabahn stent in the distal common iliac artery.  This was inflated to 10 atm and then a 12 mm diameter balloon was used to post dilate this into the aorta.  This was still slightly undersized, but was the largest diameter balloon that I had available on a long shaft since we were coming from the brachial artery.  Completion imaging following this showed less than 10% residual stenosis in the aorta or the iliac arteries, but the common femoral artery and profunda femoris artery now had no flow and had recurrent thrombosis.  The penumbra 7 bolt  catheter was brought back on the field and 2 more passes were made in the common femoral artery and profunda femoris artery.  Additional heparin was also given to the patient.  This resulted in resolution of a flow lumen but there was stenosis and possible thrombus in the proximal profunda femoris artery.  I treated this with a 5 mm diameter by 8 cm length Lutonix drug-coated angioplasty balloon inflated to 10 atm for 1 minute.  Completion imaging showed less than 20% residual stenosis after angioplasty.  The SFA remained occluded, but there would be no way to treat this from the brachial approach today and the patient would likely be relieved of rest pain by treating his inflow disease.  Consideration for a pedal access would have to be given if he still has symptoms after revascularization  today.  I elected to terminate the procedure. The sheath was removed and the left brachial artery was repaired with a series of interrupted 6-0 Prolene sutures.  A total of 5 Prolene sutures were used.  Hemostasis was achieved.  The wound was then closed with a running 3-0 Vicryl and a running 4-0 Monocryl. The patient was taken to the recovery room in stable condition having tolerated the procedure well.  Findings:               Aortogram:  This demonstrated normal renal arteries.  There was occlusion of the distal aorta above the previously placed stents.  The right common and external iliac artery stents were occluded and this was more chronic.  There was thrombosis of the left common and external iliac artery stents.  There was reconstitution through collaterals to the profunda femoris artery and the common femoral artery with significant thrombus burden in these vessels.  The SFA was occluded.  It was extremely difficult to opacify distally initially.             Left lower Extremity:  This demonstrated thrombus in the common femoral artery and profunda femoris artery and occlusion of the SFA.  There was reconstitution  of the mid popliteal artery with what appeared to be a peroneal artery and potentially a posterior tibial artery distally although the flow was quite sluggish and difficult to opacify.   Disposition: Patient was taken to the recovery room in stable condition having tolerated the procedure well.  Complications: None  Brendan Williams 12/27/2023 2:39 PM   This note was created with Dragon Medical transcription system. Any errors in dictation are purely unintentional.

## 2023-12-27 NOTE — Consult Note (Signed)
 PHARMACY - ANTICOAGULATION CONSULT NOTE  Pharmacy Consult for heparin infusion Indication:  ischemic limb  No Known Allergies  Patient Measurements: Height: 6\' 1"  (185.4 cm) Weight: 77.1 kg (170 lb) IBW/kg (Calculated) : 79.9 Heparin Dosing Weight: 77.1 kg  Vital Signs: Temp: 98.2 F (36.8 C) (03/13 1441) Temp Source: Oral (03/13 1039) BP: 112/79 (03/13 1441) Pulse Rate: 69 (03/13 1441)  Labs: Recent Labs    12/26/23 1231 12/26/23 1259 12/26/23 1922 12/27/23 0730 12/27/23 0912  HGB 12.2*  --   --   --  10.5*  HCT 37.1*  --   --   --  31.8*  PLT 324  --   --   --  272  APTT 34 35  --   --   --   LABPROT  --  13.4  --   --   --   INR  --  1.0  --   --   --   HEPARINUNFRC 0.44  --  0.48 0.34  --   CREATININE 0.73  --   --   --   --     Estimated Creatinine Clearance: 103.1 mL/min (by C-G formula based on SCr of 0.73 mg/dL).   Medical History: Past Medical History:  Diagnosis Date   Anxiety    situational   Asthma    as a child   Atherosclerosis of native arteries of extremity with intermittent claudication (HCC)    Cellulitis and abscess of toe of right foot    Critical limb ischemia of right lower extremity (HCC)    Current smoker    Quit 04/16/23   Peripheral arterial disease (HCC)     Medications:  Patient prescribed Eliquis, patient reports taking dose on 3/12  Assessment: 63 yo male sent to ED from vascular surgery office due to concern of left lower extremity ischemia.  PMH includes PAD, neuropathy, and recent  AKA on right.  Pharmacy consulted for initiation of heparin infusion.  Baseline CBC, PT/INR, aPTT, and HL ordered.  Goal of Therapy:  Heparin level 0.3-0.7 units/ml aPTT 66-102 seconds Monitor platelets by anticoagulation protocol: Yes   Plan:  Heparin infusion resumed at 1250 units/hr after vascular procedure Check aPTT and HL in 6 hours from start of infusion Adjust based on aPTT until correlation with HL Daily CBC while on  heparin  Barrie Folk, PharmD 12/27/2023,3:17 PM

## 2023-12-27 NOTE — Progress Notes (Signed)
 [facility] Progress Note  Patient: Brendan Williams    [name]:[id number]  DOA: 12/26/2023     Date of Service: the patient was seen and examined on 12/27/2023  Chief Complaint  Patient presents with   Leg Pain   Brief hospital course: Brendan Williams is a 63 y.o. 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   Brendan Williams states that 2 days ago, he was standing in his kitchen when he had sudden onset pain in his left lower leg.  Pain has progressively worsened since then and he has noticed that his leg was very cold.  He denies any other symptoms including nausea, vomiting, diarrhea, chest pain, shortness of breath.  He notes that he has been having wound issues with his right stump and it has been draining but he has not noticed any odor or redness.   ED Course:  On arrival to the ED, patient was normotensive at 117/84 with HR of 69. He was saturating at 100% on room air. He was afebrile at 98.4. Initial work up notable for hemoglobin of 12.2 and unremarkable CMP. INR and PTT within normal limits. Patient started on heparin infusion and Vascular surgery consulted. [facility] contacted for admission.    Assessment and Plan:  # Critical limb ischemia of left lower extremity As per patient pain started 2 days ago and it is getting worse. Currently patient is on Eliquis which has been held Continue statin, continue pain meds prn Continue heparin IV infusion, pharmacy consulted for dosing and PTT monitoring as per protocol. Vascular surgery consulted, scheduled for angiogram today. Patient is n.p.o. since midnight   # PAD (peripheral artery disease)  Extensive PAD requiring multiple angiographies and subsequent right AKA in December 2024.  He has only missed 1 day of Eliquis in the last week but reports otherwise good compliance.  He is no longer smoking. - Management of critical  limb ischemia as noted above   # Non-healing wound of amputation stump Unfortunately, patient has had poor wound healing of his left AKA stump.  appears worse than 3 weeks ago with increased drainage.   Previous culture data with Pseudomonas and E. coli.  MRSA PCR negative.  S/p Bactrim and ciprofloxacin given as an outpatient S/p Zyvox and cefepime d/c'd on 3/13 3/13 discussed with pharmacy, recommended to start meropenem, no need of MRSA coverage at this time. MRSA PCR negative  Follow vascular surgery for washout and OR culture data   # Neuropathy Continue home gabapentin   Body mass index is 22.43 kg/m.  Interventions:  Diet: NPO for Angioplasty today DVT Prophylaxis: Hep gtt   Advance goals of care discussion: Full code  Family Communication: family was not present at bedside, at the time of interview.  The pt provided permission to discuss medical plan with the family. Opportunity was given to ask question and all questions were answered satisfactorily.   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 Discharge to home, when cleared by vascular surgery.  Subjective: No significant events overnight, patient was resting comfortably, stated that pain is there in the left leg but it is less at rest, it hurts more when somebody touches or when he walks. Still has some drainage from the right AKA chronic  nonhealing stump wound. Denied any chest pain or palpitations, no shortness of breath.  Physical Exam: General: NAD, lying comfortably Appear in no distress, affect appropriate Eyes: PERRLA ENT: Oral Mucosa Clear, moist  Neck: no JVD,  Cardiovascular: S1 and S2 Present, no Murmur,  Respiratory: good respiratory effort, Bilateral Air entry equal and Decreased, no Crackles, no wheezes Abdomen: Bowel Sound present, Soft  and no tenderness,  Skin: no rashes Extremities: s/p Right AKA stump mild erythema and tenderness, open wounds,  LLE numbness and cold to touch, and tender, no localized infection or cellulitis suspected Neurologic: without any new focal findings Gait not checked due to patient safety concerns  Vitals:   12/27/23 0700 12/27/23 0738 12/27/23 1000 12/27/23 1039  BP: 107/74  (!) 126/95 109/89  Pulse: (!) 56  63 (!) 58  Resp: 14  16 12   Temp:  98.5 F (36.9 C)  98.1 F (36.7 C)  TempSrc:  Oral  Oral  SpO2: 96%  97% 100%  Weight:      Height:        Intake/Output Summary (Last 24 hours) at 12/27/2023 1500 Last data filed at 12/27/2023 1453 Gross per 24 hour  Intake 1877 ml  Output 1890 ml  Net -13 ml   Filed Weights   12/26/23 1144  Weight: 77.1 kg    Data Reviewed: I have personally reviewed and interpreted daily labs, tele strips, imagings as discussed above. I reviewed all nursing notes, pharmacy notes, vitals, pertinent old records I have discussed plan of care as described above with RN and patient/family.  CBC: Recent Labs  Lab 12/26/23 1231 12/27/23 0912  WBC 9.2 7.9  HGB 12.2* 10.5*  HCT 37.1* 31.8*  MCV 87.7 90.1  PLT 324 272   Basic Metabolic Panel: Recent Labs  Lab 12/26/23 1231  NA 137  K 4.6  CL 102  CO2 23  GLUCOSE 87  BUN 10  CREATININE 0.73  CALCIUM 9.1    Studies: PERIPHERAL VASCULAR CATHETERIZATION Result Date: 12/27/2023 See surgical note for result.   Scheduled Meds:  [MAR Hold] aspirin EC  81 mg Oral Daily   [MAR Hold] atorvastatin  40 mg Oral Daily   [MAR Hold] gabapentin  300 mg Oral BID   ondansetron (ZOFRAN) IV  4 mg Intravenous Once   [MAR Hold] sodium chloride flush  3 mL Intravenous Q12H   Continuous Infusions:  sodium chloride 500 mL/hr at 12/27/23 1226   acetaminophen     [MAR Hold] ceFEPime (MAXIPIME) IV Stopped (12/27/23 0807)   heparin 1,250 Units/hr (12/27/23 0600)   lactated ringers     [MAR Hold] linezolid  (ZYVOX) IV 600 mg (12/27/23 0918)   PRN Meds: acetaminophen, [MAR Hold] acetaminophen **OR** [MAR Hold] acetaminophen, [MAR Hold] diphenhydrAMINE, diphenhydrAMINE, famotidine, fentaNYL (SUBLIMAZE) injection, fentaNYL (SUBLIMAZE) injection, Heparin (Porcine) in NaCl, HYDROmorphone (DILAUDID) injection, [MAR Hold]  HYDROmorphone (DILAUDID) injection, iodixanol, methylPREDNISolone (SOLU-MEDROL) injection, midazolam, [MAR Hold] ondansetron **OR** [MAR Hold] ondansetron (ZOFRAN) IV, [MAR Hold] oxyCODONE, [MAR Hold] polyethylene glycol  Time spent: 35 minutes  Author: Gillis Santa. MD [facility] 12/27/2023 3:00 PM  To reach On-call, see care teams to locate the attending and reach out to them via [email or web address]. If 7PM-7AM, please contact night-coverage If you still have difficulty reaching the attending provider, please page the [facility] (Director on Call) for [facility] on amion for assistance.

## 2023-12-27 NOTE — Consult Note (Signed)
 PHARMACY - ANTICOAGULATION CONSULT NOTE  Pharmacy Consult for heparin infusion Indication:  ischemic limb  No Known Allergies  Patient Measurements: Height: 6\' 1"  (185.4 cm) Weight: 77.1 kg (170 lb) IBW/kg (Calculated) : 79.9 Heparin Dosing Weight: 77.1 kg  Vital Signs: Temp: 97.5 F (36.4 C) (03/13 1656) Temp Source: Oral (03/13 1656) BP: 103/87 (03/13 1656) Pulse Rate: 73 (03/13 1656)  Labs: Recent Labs    12/26/23 1231 12/26/23 1259 12/26/23 1922 12/27/23 0730 12/27/23 0912 12/27/23 1640 12/27/23 1909  HGB 12.2*  --   --   --  10.5*  --   --   HCT 37.1*  --   --   --  31.8*  --   --   PLT 324  --   --   --  272  --   --   APTT 34 35  --   --   --   --   --   LABPROT  --  13.4  --   --   --   --   --   INR  --  1.0  --   --   --   --   --   HEPARINUNFRC 0.44  --  0.48 0.34  --   --  0.44  CREATININE 0.73  --   --   --   --  0.77  --    Estimated Creatinine Clearance: 103.1 mL/min (by C-G formula based on SCr of 0.77 mg/dL).  Medical History: Past Medical History:  Diagnosis Date   Anxiety    situational   Asthma    as a child   Atherosclerosis of native arteries of extremity with intermittent claudication (HCC)    Cellulitis and abscess of toe of right foot    Critical limb ischemia of right lower extremity (HCC)    Current smoker    Quit 04/16/23   Peripheral arterial disease (HCC)    Medications:  Patient prescribed Eliquis, patient reports taking dose on 3/12  Assessment: 63 yo male sent to ED from vascular surgery office due to concern of left lower extremity ischemia.  PMH includes PAD, neuropathy, and recent  AKA on right.  Pharmacy consulted for initiation of heparin infusion.  Goal of Therapy:  Heparin level 0.3-0.7 units/mL  Monitor platelets by anticoagulation protocol: Yes  Labs: 0313 1909 HL 0.44 - therapeutic x 1 on previously therapeutic rate of 1250 units/hr    Plan:  HL therapeutic  Continue heparin infusion at 1250 units/hr   Check next HL in 6 hours to confirm rate  Daily CBC while on heparin  Littie Deeds, PharmD Pharmacy Resident  12/27/2023 7:52 PM

## 2023-12-27 NOTE — Transfer of Care (Signed)
 Immediate Anesthesia Transfer of Care Note  Patient: Brendan Williams  Procedure(s) Performed: Lower Extremity Angiography (Left)  Patient Location: PACU  Anesthesia Type:General  Level of Consciousness: awake and alert   Airway & Oxygen Therapy: Patient Spontanous Breathing and Patient connected to face mask oxygen  Post-op Assessment: Report given to RN and Post -op Vital signs reviewed and stable  Post vital signs: stable  Last Vitals:  Vitals Value Taken Time  BP 94/78 12/27/23 1446  Temp 98.2   Pulse 82 12/27/23 1451  Resp 11 12/27/23 1451  SpO2 100 % 12/27/23 1451  Vitals shown include unfiled device data.  Last Pain:  Vitals:   12/27/23 1039  TempSrc: Oral  PainSc: 6          Complications: No notable events documented.

## 2023-12-27 NOTE — Consult Note (Signed)
 PHARMACY - ANTICOAGULATION CONSULT NOTE  Pharmacy Consult for heparin infusion Indication:  ischemic limb  No Known Allergies  Patient Measurements: Height: 6\' 1"  (185.4 cm) Weight: 77.1 kg (170 lb) IBW/kg (Calculated) : 79.9 Heparin Dosing Weight: 77.1 kg  Vital Signs: Temp: 98.5 F (36.9 C) (03/13 0738) Temp Source: Oral (03/13 0738) BP: 107/74 (03/13 0700) Pulse Rate: 56 (03/13 0700)  Labs: Recent Labs    12/26/23 1231 12/26/23 1259 12/26/23 1922 12/27/23 0730  HGB 12.2*  --   --   --   HCT 37.1*  --   --   --   PLT 324  --   --   --   APTT 34 35  --   --   LABPROT  --  13.4  --   --   INR  --  1.0  --   --   HEPARINUNFRC 0.44  --  0.48 0.34  CREATININE 0.73  --   --   --     Estimated Creatinine Clearance: 103.1 mL/min (by C-G formula based on SCr of 0.73 mg/dL).   Medical History: Past Medical History:  Diagnosis Date   Anxiety    situational   Asthma    as a child   Atherosclerosis of native arteries of extremity with intermittent claudication (HCC)    Cellulitis and abscess of toe of right foot    Critical limb ischemia of right lower extremity (HCC)    Current smoker    Quit 04/16/23   Peripheral arterial disease (HCC)     Medications:  Patient prescribed Eliquis, per vascular note patient has been out of medication.  Assessment: 63 yo male sent to ED from vascular surgery office due to concern of left lower extremity ischemia.  PMH includes PAD, neuropathy, and recent  AKA on right.  Pharmacy consulted for initiation of heparin infusion.  0312 0800 HL 0.48   therapeutic x1 0313 0730 HL 0.34   therapeutic x2  Goal of Therapy:  Heparin level 0.3-0.7 units/ml Monitor platelets by anticoagulation protocol: Yes   Plan:  Heparin level is therapeutic. Will continue heparin infusion at 1250 units/hr. Likely vascular angiogram with possible intervention on 12/27/23.  Recheck heparin level and CBC with AM labs.   Trella Thurmond A,  PharmD 12/27/2023,8:14 AM

## 2023-12-28 ENCOUNTER — Encounter: Payer: Self-pay | Admitting: Vascular Surgery

## 2023-12-28 DIAGNOSIS — I7409 Other arterial embolism and thrombosis of abdominal aorta: Secondary | ICD-10-CM | POA: Diagnosis not present

## 2023-12-28 DIAGNOSIS — I743 Embolism and thrombosis of arteries of the lower extremities: Secondary | ICD-10-CM | POA: Diagnosis not present

## 2023-12-28 DIAGNOSIS — Z89611 Acquired absence of right leg above knee: Secondary | ICD-10-CM

## 2023-12-28 DIAGNOSIS — I70222 Atherosclerosis of native arteries of extremities with rest pain, left leg: Secondary | ICD-10-CM | POA: Diagnosis not present

## 2023-12-28 DIAGNOSIS — I700 Atherosclerosis of aorta: Secondary | ICD-10-CM | POA: Diagnosis not present

## 2023-12-28 DIAGNOSIS — Z9889 Other specified postprocedural states: Secondary | ICD-10-CM

## 2023-12-28 LAB — CBC
HCT: 26.5 % — ABNORMAL LOW (ref 39.0–52.0)
Hemoglobin: 8.5 g/dL — ABNORMAL LOW (ref 13.0–17.0)
MCH: 28.8 pg (ref 26.0–34.0)
MCHC: 32.1 g/dL (ref 30.0–36.0)
MCV: 89.8 fL (ref 80.0–100.0)
Platelets: 232 10*3/uL (ref 150–400)
RBC: 2.95 MIL/uL — ABNORMAL LOW (ref 4.22–5.81)
RDW: 15 % (ref 11.5–15.5)
WBC: 10.4 10*3/uL (ref 4.0–10.5)
nRBC: 0 % (ref 0.0–0.2)

## 2023-12-28 LAB — IRON AND TIBC
Iron: 23 ug/dL — ABNORMAL LOW (ref 45–182)
Saturation Ratios: 11 % — ABNORMAL LOW (ref 17.9–39.5)
TIBC: 220 ug/dL — ABNORMAL LOW (ref 250–450)
UIBC: 197 ug/dL

## 2023-12-28 LAB — BASIC METABOLIC PANEL
Anion gap: 11 (ref 5–15)
BUN: 13 mg/dL (ref 8–23)
CO2: 21 mmol/L — ABNORMAL LOW (ref 22–32)
Calcium: 8.3 mg/dL — ABNORMAL LOW (ref 8.9–10.3)
Chloride: 104 mmol/L (ref 98–111)
Creatinine, Ser: 1.03 mg/dL (ref 0.61–1.24)
GFR, Estimated: >60 mL/min (ref >60)
Glucose, Bld: 233 mg/dL — ABNORMAL HIGH (ref 70–99)
Potassium: 3.7 mmol/L (ref 3.5–5.1)
Sodium: 136 mmol/L (ref 135–145)

## 2023-12-28 LAB — MAGNESIUM: Magnesium: 1.7 mg/dL (ref 1.7–2.4)

## 2023-12-28 LAB — VITAMIN B12: Vitamin B-12: 207 pg/mL (ref 180–914)

## 2023-12-28 LAB — FOLATE: Folate: 15.6 ng/mL (ref >5.9)

## 2023-12-28 LAB — PHOSPHORUS: Phosphorus: 2.8 mg/dL (ref 2.5–4.6)

## 2023-12-28 LAB — HEPARIN LEVEL (UNFRACTIONATED): Heparin Unfractionated: 0.41 [IU]/mL (ref 0.30–0.70)

## 2023-12-28 LAB — VITAMIN D 25 HYDROXY (VIT D DEFICIENCY, FRACTURES): Vit D, 25-Hydroxy: 15.06 ng/mL — ABNORMAL LOW (ref 30–100)

## 2023-12-28 MED ORDER — VITAMIN C 500 MG PO TABS
500.0000 mg | ORAL_TABLET | Freq: Every day | ORAL | Status: DC
  Administered 2023-12-28 – 2024-01-01 (×5): 500 mg via ORAL
  Filled 2023-12-28 (×5): qty 1

## 2023-12-28 MED ORDER — POLYSACCHARIDE IRON COMPLEX 150 MG PO CAPS
150.0000 mg | ORAL_CAPSULE | Freq: Every day | ORAL | Status: DC
  Administered 2023-12-28 – 2024-01-01 (×4): 150 mg via ORAL
  Filled 2023-12-28 (×6): qty 1

## 2023-12-28 MED ORDER — MIDODRINE HCL 5 MG PO TABS
10.0000 mg | ORAL_TABLET | Freq: Three times a day (TID) | ORAL | Status: DC
  Administered 2023-12-28 – 2023-12-30 (×8): 10 mg via ORAL
  Filled 2023-12-28 (×8): qty 2

## 2023-12-28 NOTE — Consult Note (Signed)
 PHARMACY - ANTICOAGULATION CONSULT NOTE  Pharmacy Consult for heparin infusion Indication:  ischemic limb  No Known Allergies  Patient Measurements: Height: 6\' 1"  (185.4 cm) Weight: 77.1 kg (170 lb) IBW/kg (Calculated) : 79.9 Heparin Dosing Weight: 77.1 kg  Vital Signs: Temp: 97.5 F (36.4 C) (03/13 2126) Temp Source: Oral (03/13 1656) BP: 99/81 (03/13 2126) Pulse Rate: 64 (03/13 2126)  Labs: Recent Labs    12/26/23 1231 12/26/23 1259 12/26/23 1922 12/27/23 0730 12/27/23 0912 12/27/23 1640 12/27/23 1909 12/28/23 0108  HGB 12.2*  --   --   --  10.5*  --   --  8.5*  HCT 37.1*  --   --   --  31.8*  --   --  26.5*  PLT 324  --   --   --  272  --   --  232  APTT 34 35  --   --   --   --   --   --   LABPROT  --  13.4  --   --   --   --   --   --   INR  --  1.0  --   --   --   --   --   --   HEPARINUNFRC 0.44  --    < > 0.34  --   --  0.44 0.41  CREATININE 0.73  --   --   --   --  0.77  --  1.03   < > = values in this interval not displayed.   Estimated Creatinine Clearance: 80.1 mL/min (by C-G formula based on SCr of 1.03 mg/dL).  Medical History: Past Medical History:  Diagnosis Date   Anxiety    situational   Asthma    as a child   Atherosclerosis of native arteries of extremity with intermittent claudication (HCC)    Cellulitis and abscess of toe of right foot    Critical limb ischemia of right lower extremity (HCC)    Current smoker    Quit 04/16/23   Peripheral arterial disease (HCC)    Medications:  Patient prescribed Eliquis, patient reports taking dose on 3/12  Assessment: 63 yo male sent to ED from vascular surgery office due to concern of left lower extremity ischemia.  PMH includes PAD, neuropathy, and recent  AKA on right.  Pharmacy consulted for initiation of heparin infusion.  Goal of Therapy:  Heparin level 0.3-0.7 units/mL  Monitor platelets by anticoagulation protocol: Yes  Labs: 0313 1909 HL 0.44 - therapeutic x 1 on previously therapeutic  rate of 1250 units/hr  0314 0108 HL 0.41, therapeutic X 2    Plan:  3/14:  HL @ 0108 = 0.41, therapeutic X 2 - Will continue pt on current rate and recheck HL on 3/15 with AM labs.  Daily CBC while on heparin  Takita Riecke D, PharmD 12/28/2023 1:35 AM

## 2023-12-28 NOTE — Anesthesia Postprocedure Evaluation (Signed)
 Anesthesia Post Note  Patient: Brendan Williams  Procedure(s) Performed: Lower Extremity Angiography (Left)  Patient location during evaluation: PACU Anesthesia Type: General Level of consciousness: awake and alert, oriented and patient cooperative Pain management: pain level controlled Vital Signs Assessment: post-procedure vital signs reviewed and stable Respiratory status: spontaneous breathing, nonlabored ventilation and respiratory function stable Cardiovascular status: blood pressure returned to baseline and stable Postop Assessment: adequate PO intake Anesthetic complications: no   No notable events documented.   Last Vitals:  Vitals:   12/28/23 0341 12/28/23 0540  BP: (!) 76/54 (!) 80/60  Pulse: (!) 56 71  Resp:    Temp:    SpO2:  99%    Last Pain:  Vitals:   12/28/23 0545  TempSrc:   PainSc: 5                  Reed Breech

## 2023-12-28 NOTE — Progress Notes (Signed)
 Progress Note    12/28/2023 11:29 AM 1 Day Post-Op  Subjective: Mr. Euell Schiff is a 63 year old male now postop day #1 from catheter placement into the left SFA and profunda femoris arteries from the left brachial approach for an aortogram with selective left lower extremity angiogram.  Patient also underwent mechanical thrombectomy of the left common and external iliac arteries, left common femoral and profunda femoris arteries with stent placement to the left external iliac artery and stent placement to the aorta and most proximal common iliac artery.  Patient sitting up resting comfortably in bed this morning.  Patient endorses his left leg feels much better this morning.  Patient states his left lower extremity is much warmer this morning.  She is recovering as expected.  No complaints overnight.  Vitals all remained stable.   Vitals:   12/28/23 1015 12/28/23 1059  BP: 90/67 116/83  Pulse: 72   Resp: 18   Temp: 98.1 F (36.7 C)   SpO2: 99%    Physical Exam: Cardiac:  RRR, normal S1 and S2, no murmurs appreciated. Lungs: Huel Cote on auscultation throughout, no rales rhonchi or wheezing. Incisions: Left brachial arm with cutdown incision.  Suture closed with Dermabond.  No hematoma seroma or infection to note. Extremities: Left lower extremity warm to touch with positive Doppler DP pulses.  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  Dressing changed this morning by nursing. Abdomen: Positive bowel sounds throughout, soft, nontender and nondistended. Neurologic: And oriented x 3, answers all questions and follows commands appropriately.  CBC    Component Value Date/Time   WBC 10.4 12/28/2023 0108   RBC 2.95 (L) 12/28/2023 0108   HGB 8.5 (L) 12/28/2023 0108   HCT 26.5 (L) 12/28/2023 0108   PLT 232 12/28/2023 0108   MCV 89.8 12/28/2023 0108   MCH 28.8 12/28/2023 0108   MCHC 32.1 12/28/2023 0108   RDW 15.0 12/28/2023 0108   LYMPHSABS 1.7 10/19/2023 0520   MONOABS 1.2  (H) 10/19/2023 0520   EOSABS 0.0 10/19/2023 0520   BASOSABS 0.0 10/19/2023 0520    BMET    Component Value Date/Time   NA 136 12/28/2023 0108   K 3.7 12/28/2023 0108   CL 104 12/28/2023 0108   CO2 21 (L) 12/28/2023 0108   GLUCOSE 233 (H) 12/28/2023 0108   BUN 13 12/28/2023 0108   CREATININE 1.03 12/28/2023 0108   CALCIUM 8.3 (L) 12/28/2023 0108   GFRNONAA >60 12/28/2023 0108   GFRAA >60 12/04/2019 0523    INR    Component Value Date/Time   INR 1.0 12/26/2023 1259     Intake/Output Summary (Last 24 hours) at 12/28/2023 1129 Last data filed at 12/28/2023 0900 Gross per 24 hour  Intake 2117 ml  Output 2190 ml  Net -73 ml     Assessment/Plan:  63 y.o. male is s/p catheter placement into the left SFA and profunda femoris arteries from the left brachial approach for an aortogram with selective left lower extremity angiogram.  Patient also underwent mechanical thrombectomy of the left common and external iliac arteries, left common femoral and profunda femoris arteries with stent placement to the left external iliac artery and stent placement to the aorta and most proximal common iliac artery. 1 Day Post-Op   PLAN Vascular surgery plans on taking the patient back to the operating room for right lower extremity AKA wound washout on Monday, 12/31/2023.  Patient will remain on heparin infusion until that time.  Plan postsurgery is to convert to  oral anticoagulation and possibly discharge on Tuesday.  Left upper extremity brachial cutdown is without complication and Dermabond with suture closure.  She was instructed to keep clean and dry. Pain medications as needed for the patient. Advance diet as tolerated. Continue to work with physical therapy and Occupational Therapy.  DVT prophylaxis: Heparin infusion   [name] 12/28/2023 11:29 AM

## 2023-12-28 NOTE — Progress Notes (Signed)
 [facility] Progress Note  Patient: Brendan Williams    BMW:[id number]  DOA: 12/26/2023     Date of Service: the patient was seen and examined on 12/28/2023  Chief Complaint  Patient presents with   Leg Pain   Brief hospital course: Brendan Williams is a 63 y.o. 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   Brendan Williams states that 2 days ago, he was standing in his kitchen when he had sudden onset pain in his left lower leg.  Pain has progressively worsened since then and he has noticed that his leg was very cold.  He denies any other symptoms including nausea, vomiting, diarrhea, chest pain, shortness of breath.  He notes that he has been having wound issues with his right stump and it has been draining but he has not noticed any odor or redness.   ED Course:  On arrival to the ED, patient was normotensive at 117/84 with HR of 69. He was saturating at 100% on room air. He was afebrile at 98.4. Initial work up notable for hemoglobin of 12.2 and unremarkable CMP. INR and PTT within normal limits. Patient started on heparin infusion and Vascular surgery consulted. [facility] contacted for admission.    Assessment and Plan:  # Critical limb ischemia of left lower extremity As per patient pain started 2 days ago and it is getting worse. Currently patient is on Eliquis which has been held Continue statin, continue pain meds prn Continue heparin IV infusion, pharmacy consulted for dosing and PTT monitoring as per protocol. Vascular surgery consulted, On 3/13 s/p Agio: Mechanical thrombectomy of the left common and external iliac arteries, left common femoral and profunda femoris arteries with stent placement to the left external iliac artery and stent placement to the aorta and most proximal common iliac artery.    # PAD (peripheral artery disease)  Extensive PAD requiring  multiple angiographies and subsequent right AKA in December 2024.  He has only missed 1 day of Eliquis in the last week but reports otherwise good compliance.  He is no longer smoking. - Management of critical limb ischemia as noted above   # Non-healing wound of amputation stump Unfortunately, patient has had poor wound healing of his left AKA stump.  appears worse than 3 weeks ago with increased drainage.   Previous culture data with Pseudomonas and E. coli.  MRSA PCR negative.  S/p Bactrim and ciprofloxacin given as an outpatient S/p Zyvox and cefepime d/c'd on 3/13 3/13 discussed with pharmacy, recommended to start meropenem, no need of MRSA coverage at this time. MRSA PCR negative  3/14 as per vascular surgery patient will be taken to the OR on Monday for washout and possible discharge on Tuesday. Follow vascular surgery for washout and OR culture data   # Hypotension 3/14 started midodrine 10 mg p.o. 3 times daily with holding parameters  # Neuropathy Continue home gabapentin  # Anemia, Iron deficiency, transferrin saturation 7% Started oral iron supplement with Vit C  Folate wnl   Body mass index is 22.43 kg/m.  Interventions:  Diet: NPO for Angioplasty today DVT Prophylaxis: Hep gtt   Advance goals of care discussion: Full code  Family Communication: family was not present at bedside, at the time of interview.  The pt provided permission to discuss medical plan with the family. Opportunity was given to ask question and all questions  were answered satisfactorily.   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 Discharge to home, when cleared by vascular surgery.  Subjective: No significant events overnight, pain was 8/10, improved after pain meds. Denies any other complaints    Physical Exam: General: NAD,  lying comfortably Appear in no distress, affect appropriate Eyes: PERRLA ENT: Oral Mucosa Clear, moist  Neck: no JVD,  Cardiovascular: S1 and S2 Present, no Murmur,  Respiratory: good respiratory effort, Bilateral Air entry equal and Decreased, no Crackles, no wheezes Abdomen: Bowel Sound present, Soft and no tenderness,  Skin: no rashes Extremities: s/p Right AKA stump mild erythema and tenderness, open wounds,  LLE numbness and cold to touch, and tender, no localized infection or cellulitis suspected Neurologic: without any new focal findings Gait not checked due to patient safety concerns  Vitals:   12/28/23 0540 12/28/23 0724 12/28/23 1015 12/28/23 1059  BP: (!) 80/60 98/86 90/67  116/83  Pulse: 71  72   Resp:   18   Temp:   98.1 F (36.7 C)   TempSrc:      SpO2: 99%  99%   Weight:      Height:        Intake/Output Summary (Last 24 hours) at 12/28/2023 1340 Last data filed at 12/28/2023 0900 Gross per 24 hour  Intake 2017 ml  Output 2190 ml  Net -173 ml   Filed Weights   12/26/23 1144  Weight: 77.1 kg    Data Reviewed: I have personally reviewed and interpreted daily labs, tele strips, imagings as discussed above. I reviewed all nursing notes, pharmacy notes, vitals, pertinent old records I have discussed plan of care as described above with RN and patient/family.  CBC: Recent Labs  Lab 12/26/23 1231 12/27/23 0912 12/28/23 0108  WBC 9.2 7.9 10.4  HGB 12.2* 10.5* 8.5*  HCT 37.1* 31.8* 26.5*  MCV 87.7 90.1 89.8  PLT 324 272 232   Basic Metabolic Panel: Recent Labs  Lab 12/26/23 1231 12/27/23 1640 12/28/23 0108  NA 137 139 136  K 4.6 3.7 3.7  CL 102 107 104  CO2 23 21* 21*  GLUCOSE 87 181* 233*  BUN 10 10 13   CREATININE 0.73 0.77 1.03  CALCIUM 9.1 8.7* 8.3*  MG  --   --  1.7  PHOS  --   --  2.8    Studies: No results found.   Scheduled Meds:  aspirin EC  81 mg Oral Daily   atorvastatin  40 mg Oral Daily   Chlorhexidine Gluconate Cloth  6  each Topical Q0600   gabapentin  300 mg Oral BID   midodrine  10 mg Oral TID WC   sodium chloride flush  3 mL Intravenous Q12H   Continuous Infusions:  heparin 1,250 Units/hr (12/28/23 0858)   meropenem (MERREM) IV 1 g (12/28/23 0544)   PRN Meds: acetaminophen **OR** acetaminophen, diphenhydrAMINE, HYDROmorphone (DILAUDID) injection, ondansetron **OR** ondansetron (ZOFRAN) IV, oxyCODONE, polyethylene glycol  Time spent: 35 minutes  Author: Gillis Santa. MD [facility] 12/28/2023 1:40 PM  To reach On-call, see care teams to locate the attending and reach out to them via [email or web address]. If 7PM-7AM, please contact night-coverage If you still have difficulty reaching the attending provider, please page the [facility] (Director on Call) for [facility] on amion for assistance.

## 2023-12-28 NOTE — Evaluation (Signed)
 Occupational Therapy Evaluation Patient Details Name: Brendan Williams MRN: [id number] DOB: January 03, 1961 Today's Date: 12/28/2023   History of Present Illness   Brendan Williams is a 63 y.o. 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     Clinical Impressions Brendan Williams was seen for OT evaluation this date. Prior to hospital admission, pt was MOD I using RW for household mobility. Pt lives with family. Pt currently requires CGA + RW for ADL t/f ~6 ft, +2 for impulsivity and decreased safety awareness. SUPERVISION for LB access in sitting. Pt would benefit from skilled OT to address noted impairments and functional limitations (see below for any additional details). Upon hospital discharge, recommend no OT follow up.     If plan is discharge home, recommend the following:   A little help with walking and/or transfers;A little help with bathing/dressing/bathroom     Functional Status Assessment   Patient has had a recent decline in their functional status and demonstrates the ability to make significant improvements in function in a reasonable and predictable amount of time.     Equipment Recommendations   Other (comment) (RW)     Recommendations for Other Services         Precautions/Restrictions   Precautions Precautions: Fall Restrictions Weight Bearing Restrictions Per Provider Order: No Other Position/Activity Restrictions: prior RLE AKA     Mobility Bed Mobility               General bed mobility comments: not tested    Transfers Overall transfer level: Needs assistance Equipment used: Rolling walker (2 wheels) Transfers: Sit to/from Stand Sit to Stand: Contact guard assist, +2 safety/equipment           General transfer comment: impulsive      Balance Overall balance assessment: Needs assistance Sitting-balance support:  Bilateral upper extremity supported Sitting balance-Leahy Scale: Normal     Standing balance support: Bilateral upper extremity supported Standing balance-Leahy Scale: Poor                             ADL either performed or assessed with clinical judgement   ADL Overall ADL's : Needs assistance/impaired                                       General ADL Comments: CGA + RW for ADL t/f ~6 ft.      Pertinent Vitals/Pain Pain Assessment Pain Assessment: No/denies pain     Extremity/Trunk Assessment Upper Extremity Assessment Upper Extremity Assessment: Overall WFL for tasks assessed   Lower Extremity Assessment Lower Extremity Assessment: Generalized weakness RLE Deficits / Details: prior R AKA with wound.   Cervical / Trunk Assessment Cervical / Trunk Assessment: Normal   Communication Communication Communication: No apparent difficulties   Cognition Arousal: Alert Behavior During Therapy: WFL for tasks assessed/performed Cognition: No apparent impairments                               Following commands: Intact       Cueing  General Comments   Cueing Techniques: Verbal cues      Exercises     Shoulder Instructions      Home Living  Family/patient expects to be discharged to:: Private residence Living Arrangements: Spouse/significant other Available Help at Discharge: Family;Available 24 hours/day Type of Home: Mobile home Home Access: Stairs to enter Entrance Stairs-Number of Steps: 3 Entrance Stairs-Rails: Right;Left;Can reach both Home Layout: One level     Bathroom Shower/Tub: Producer, television/film/video: Standard Bathroom Accessibility: No   Home Equipment: Grab bars - tub/shower;Rollator (4 wheels);BSC/3in1;Shower seat;Hand held Programmer, systems (2 wheels)   Additional Comments: Reports RW is broken.      Prior Functioning/Environment Prior Level of Function : History of Falls  (last six months)             Mobility Comments: hopping household distances using rollator. SIL helps with ascending stairs.      OT Problem List: Decreased strength;Decreased range of motion;Decreased activity tolerance;Impaired balance (sitting and/or standing)   OT Treatment/Interventions: Self-care/ADL training;Therapeutic exercise;Energy conservation;DME and/or AE instruction;Therapeutic activities      OT Goals(Current goals can be found in the care plan section)   Acute Rehab OT Goals Patient Stated Goal: to go home OT Goal Formulation: With patient Time For Goal Achievement: 01/11/24 Potential to Achieve Goals: Good ADL Goals Pt Will Perform Grooming: Independently;sitting Pt Will Perform Lower Body Dressing: with modified independence;sit to/from stand Pt Will Transfer to Toilet: with modified independence;ambulating;regular height toilet   OT Frequency:  Min 1X/week    Co-evaluation PT/OT/SLP Co-Evaluation/Treatment: Yes Reason for Co-Treatment: For patient/therapist safety;To address functional/ADL transfers PT goals addressed during session: Mobility/safety with mobility OT goals addressed during session: ADL's and self-care      AM-PAC OT "6 Clicks" Daily Activity     Outcome Measure Help from another person eating meals?: None Help from another person taking care of personal grooming?: None Help from another person toileting, which includes using toliet, bedpan, or urinal?: A Little Help from another person bathing (including washing, rinsing, drying)?: A Little Help from another person to put on and taking off regular upper body clothing?: None Help from another person to put on and taking off regular lower body clothing?: A Little 6 Click Score: 21   End of Session Equipment Utilized During Treatment: Rolling walker (2 wheels);Gait belt  Activity Tolerance: Patient tolerated treatment well Patient left: in bed;with call bell/phone within reach;with  bed alarm set  OT Visit Diagnosis: Other abnormalities of gait and mobility (R26.89);Muscle weakness (generalized) (M62.81)                Time: 8119-1478 OT Time Calculation (min): 13 min Charges:  OT General Charges $OT Visit: 1 Visit OT Evaluation $OT Eval Low Complexity: 1 Low  Brendan Williams, M.S. OTR/L  12/28/23, 3:29 PM  ascom [phone]

## 2023-12-28 NOTE — Plan of Care (Signed)

## 2023-12-28 NOTE — Evaluation (Signed)
 Physical Therapy Evaluation Patient Details Name: Brendan Williams MRN: [id number] DOB: [birth year] Today's Date: 12/28/2023  History of Present Illness  Brendan Williams is a 63 y.o. 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  Clinical Impression  Pt admitted with above diagnosis. Pt currently with functional limitations due to the deficits listed below (see PT Problem List). Pt received sitting EoB agreeable to PT/OT co-evaluation. Reports PTA needing physical assist to asc steps since RLE AKA but otherwise ambulating hop-to pattern using a rollator in household.   To date, pt with improved pain in LLE. Min VC's for safe hand placement to stand to RW and CGA. Pt hopping ~20' in room demonstrating safety with hopping short household distances. Min VC's for safe stand to sit transfer with poor use of RW. Education on need for RW for safer hop to gait compared to rollator. Pt using rollator due to his RW being broken. Also re-educated on R hip mobility and flexibility exercises to perform in prep for future prosthesis. Pt somewhat distant not appearing interested in education provided but states understanding. Pt left seated EOB with all needs in reach. Pt will benefit from skilled PT services to address acute deficits in muscle weakness, poor safety awareness and stairs assessment.       If plan is discharge home, recommend the following: A little help with walking and/or transfers;A little help with bathing/dressing/bathroom;Assistance with cooking/housework;Assist for transportation;Help with stairs or ramp for entrance   Can travel by private vehicle        Equipment Recommendations Rolling walker (2 wheels)  Recommendations for Other Services       Functional Status Assessment Patient has had a recent decline in their functional status and demonstrates the ability to make  significant improvements in function in a reasonable and predictable amount of time.     Precautions / Restrictions Precautions Precautions: Fall Restrictions Weight Bearing Restrictions Per Provider Order: No Other Position/Activity Restrictions: prior RLE AKA      Mobility  Bed Mobility               General bed mobility comments: not tested. Seated EOB beginning and end of session. Patient Response: Cooperative  Transfers Overall transfer level: Needs assistance Equipment used: Rolling walker (2 wheels) Transfers: Sit to/from Stand Sit to Stand: Contact guard assist           General transfer comment: VC's for hand placement.    Ambulation/Gait Ambulation/Gait assistance: Contact guard assist Gait Distance (Feet): 20 Feet Assistive device: Rolling walker (2 wheels)         General Gait Details: hop-to pattern. Good stability on LLE and BUE support on RW. Min education on RW proximity with transfer from standing back to sitting.  Stairs            Wheelchair Mobility     Tilt Bed Tilt Bed Patient Response: Cooperative  Modified Rankin (Stroke Patients Only)       Balance Overall balance assessment: Needs assistance Sitting-balance support: Bilateral upper extremity supported Sitting balance-Leahy Scale: Normal       Standing balance-Leahy Scale: Poor Standing balance comment: reliant on AD to stand due to RLE AKA                             Pertinent Vitals/Pain Pain Assessment Pain Assessment:  Faces Faces Pain Scale: Hurts a little bit Pain Location: R amputation site Pain Descriptors / Indicators: Discomfort Pain Intervention(s): Limited activity within patient's tolerance, Monitored during session, Repositioned    Home Living Family/patient expects to be discharged to:: Private residence Living Arrangements: Spouse/significant other Available Help at Discharge: Family;Available 24 hours/day Type of Home: Mobile  home Home Access: Stairs to enter Entrance Stairs-Rails: Right;Left;Can reach both Entrance Stairs-Number of Steps: 3   Home Layout: One level Home Equipment: Grab bars - tub/shower;Rollator (4 wheels);BSC/3in1;Shower seat;Hand held Programmer, systems (2 wheels) Additional Comments: Reports RW is broken.    Prior Function Prior Level of Function : History of Falls (last six months)             Mobility Comments: hopping household distances using rollator. SIL helps with ascending stairs.       Extremity/Trunk Assessment   Upper Extremity Assessment Upper Extremity Assessment: Defer to OT evaluation    Lower Extremity Assessment Lower Extremity Assessment: Generalized weakness;RLE deficits/detail RLE Deficits / Details: prior R AKA with wound.    Cervical / Trunk Assessment Cervical / Trunk Assessment: Normal  Communication   Communication Communication: No apparent difficulties    Cognition Arousal: Alert Behavior During Therapy: WFL for tasks assessed/performed   PT - Cognitive impairments: No apparent impairments                         Following commands: Intact       Cueing Cueing Techniques: Verbal cues     General Comments      Exercises Other Exercises Other Exercises: reviewed R hip positioning and exercises in prep for future RLE prosthesis   Assessment/Plan    PT Assessment Patient needs continued PT services  PT Problem List Decreased strength;Decreased activity tolerance;Decreased balance;Pain;Decreased mobility;Decreased knowledge of precautions       PT Treatment Interventions DME instruction;Gait training;Stair training;Patient/family education;Functional mobility training;Therapeutic activities;Therapeutic exercise;Balance training    PT Goals (Current goals can be found in the Care Plan section)  Acute Rehab PT Goals Patient Stated Goal: go home. Get ramp installed. PT Goal Formulation: With patient Time For Goal  Achievement: 01/11/24 Potential to Achieve Goals: Good    Frequency Min 2X/week     Co-evaluation               AM-PAC PT "6 Clicks" Mobility  Outcome Measure Help needed turning from your back to your side while in a flat bed without using bedrails?: None Help needed moving from lying on your back to sitting on the side of a flat bed without using bedrails?: None Help needed moving to and from a bed to a chair (including a wheelchair)?: A Little Help needed standing up from a chair using your arms (e.g., wheelchair or bedside chair)?: A Little Help needed to walk in hospital room?: A Little Help needed climbing 3-5 steps with a railing? : A Lot 6 Click Score: 19    End of Session Equipment Utilized During Treatment: Gait belt Activity Tolerance: Patient tolerated treatment well Patient left: in bed;with bed alarm set (sitting EOB) Nurse Communication: Mobility status PT Visit Diagnosis: Unsteadiness on feet (R26.81);Other abnormalities of gait and mobility (R26.89);Muscle weakness (generalized) (M62.81);History of falling (Z91.81);Pain Pain - Right/Left:  (BLE's) Pain - part of body: Leg    Time: 0981-1914 PT Time Calculation (min) (ACUTE ONLY): 12 min   Charges:   PT Evaluation $PT Eval Low Complexity: 1 Low   PT General  Charges $$ ACUTE PT VISIT: 1 Visit        Delphia Grates. Fairly IV, PT, DPT Physical Therapist- [place]  [facility]  12/28/2023, 2:42 PM

## 2023-12-29 DIAGNOSIS — I70222 Atherosclerosis of native arteries of extremities with rest pain, left leg: Secondary | ICD-10-CM | POA: Diagnosis not present

## 2023-12-29 LAB — HEPARIN LEVEL (UNFRACTIONATED)
Heparin Unfractionated: 0.24 [IU]/mL — ABNORMAL LOW (ref 0.30–0.70)
Heparin Unfractionated: 0.14 [IU]/mL — ABNORMAL LOW (ref 0.30–0.70)
Heparin Unfractionated: 0.41 [IU]/mL (ref 0.30–0.70)

## 2023-12-29 LAB — BASIC METABOLIC PANEL
Anion gap: 8 (ref 5–15)
BUN: 11 mg/dL (ref 8–23)
CO2: 23 mmol/L (ref 22–32)
Calcium: 8.3 mg/dL — ABNORMAL LOW (ref 8.9–10.3)
Chloride: 105 mmol/L (ref 98–111)
Creatinine, Ser: 0.77 mg/dL (ref 0.61–1.24)
GFR, Estimated: >60 mL/min (ref >60)
Glucose, Bld: 106 mg/dL — ABNORMAL HIGH (ref 70–99)
Potassium: 3.8 mmol/L (ref 3.5–5.1)
Sodium: 136 mmol/L (ref 135–145)

## 2023-12-29 LAB — MAGNESIUM: Magnesium: 2 mg/dL (ref 1.7–2.4)

## 2023-12-29 LAB — CBC
HCT: 25.2 % — ABNORMAL LOW (ref 39.0–52.0)
Hemoglobin: 8.2 g/dL — ABNORMAL LOW (ref 13.0–17.0)
MCH: 29.2 pg (ref 26.0–34.0)
MCHC: 32.5 g/dL (ref 30.0–36.0)
MCV: 89.7 fL (ref 80.0–100.0)
Platelets: 253 10*3/uL (ref 150–400)
RBC: 2.81 MIL/uL — ABNORMAL LOW (ref 4.22–5.81)
RDW: 15.2 % (ref 11.5–15.5)
WBC: 10.2 10*3/uL (ref 4.0–10.5)
nRBC: 0 % (ref 0.0–0.2)

## 2023-12-29 LAB — PHOSPHORUS: Phosphorus: 2.4 mg/dL — ABNORMAL LOW (ref 2.5–4.6)

## 2023-12-29 MED ORDER — K PHOS MONO-SOD PHOS DI & MONO 155-852-130 MG PO TABS
500.0000 mg | ORAL_TABLET | Freq: Three times a day (TID) | ORAL | Status: AC
  Administered 2023-12-29 (×3): 500 mg via ORAL
  Filled 2023-12-29 (×3): qty 2

## 2023-12-29 MED ORDER — VITAMIN D (ERGOCALCIFEROL) 1.25 MG (50000 UNIT) PO CAPS
50000.0000 [IU] | ORAL_CAPSULE | ORAL | Status: DC
  Administered 2023-12-29: 50000 [IU] via ORAL
  Filled 2023-12-29: qty 1

## 2023-12-29 MED ORDER — HEPARIN BOLUS VIA INFUSION
1150.0000 [IU] | Freq: Once | INTRAVENOUS | Status: AC
  Administered 2023-12-29: 1150 [IU] via INTRAVENOUS
  Filled 2023-12-29: qty 1150

## 2023-12-29 MED ORDER — HEPARIN BOLUS VIA INFUSION
2300.0000 [IU] | Freq: Once | INTRAVENOUS | Status: AC
  Administered 2023-12-29: 2300 [IU] via INTRAVENOUS
  Filled 2023-12-29: qty 2300

## 2023-12-29 MED ORDER — VITAMIN B-12 1000 MCG PO TABS: 1000.0000 ug | ORAL_TABLET | Freq: Every day | ORAL | Status: DC

## 2023-12-29 MED ORDER — CYANOCOBALAMIN 1000 MCG/ML IJ SOLN
1000.0000 ug | Freq: Every day | INTRAMUSCULAR | Status: DC
  Administered 2023-12-29 – 2024-01-01 (×3): 1000 ug via INTRAMUSCULAR
  Filled 2023-12-29 (×4): qty 1

## 2023-12-29 NOTE — Progress Notes (Signed)
 [facility] Progress Note  Patient: Brendan Williams    BJY:[id number]  DOA: 12/26/2023     Date of Service: the patient was seen and examined on 12/29/2023  Chief Complaint  Patient presents with   Leg Pain   Brief hospital course: Brendan Williams is a 63 y.o. 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   Brendan Williams states that 2 days ago, he was standing in his kitchen when he had sudden onset pain in his left lower leg.  Pain has progressively worsened since then and he has noticed that his leg was very cold.  He denies any other symptoms including nausea, vomiting, diarrhea, chest pain, shortness of breath.  He notes that he has been having wound issues with his right stump and it has been draining but he has not noticed any odor or redness.   ED Course:  On arrival to the ED, patient was normotensive at 117/84 with HR of 69. He was saturating at 100% on room air. He was afebrile at 98.4. Initial work up notable for hemoglobin of 12.2 and unremarkable CMP. INR and PTT within normal limits. Patient started on heparin infusion and Vascular surgery consulted. [facility] contacted for admission.    Assessment and Plan:  # Critical limb ischemia of left lower extremity As per patient pain started 2 days ago and it is getting worse. Currently patient is on Eliquis which has been held Continue statin, continue pain meds prn Continue heparin IV infusion, pharmacy consulted for dosing and PTT monitoring as per protocol. Vascular surgery consulted, On 3/13 s/p Agio: Mechanical thrombectomy of the left common and external iliac arteries, left common femoral and profunda femoris arteries with stent placement to the left external iliac artery and stent placement to the aorta and most proximal common iliac artery.  3/15 as vasc pt may need repeat arteriography for further left SFA  revascularization which can be done on Monday   # PAD (peripheral artery disease)  Extensive PAD requiring multiple angiographies and subsequent right AKA in December 2024.  He has only missed 1 day of Eliquis in the last week but reports otherwise good compliance.  He is no longer smoking. - Management of critical limb ischemia as noted above   # Non-healing wound of amputation stump Unfortunately, patient has had poor wound healing of his left AKA stump.  appears worse than 3 weeks ago with increased drainage.   Previous culture data with Pseudomonas and E. coli.  MRSA PCR negative.  S/p Bactrim and ciprofloxacin given as an outpatient S/p Zyvox and cefepime d/c'd on 3/13 3/13 discussed with pharmacy, recommended to start meropenem, no need of MRSA coverage at this time. MRSA PCR negative  3/14 as per vascular surgery patient will be taken to the OR on Monday for washout and possible discharge on Tuesday. Follow vascular surgery for washout and OR culture data   # Hypotension 3/14 started midodrine 10 mg p.o. 3 times daily with holding parameters  # Neuropathy Continue home gabapentin  # Anemia, Iron deficiency, transferrin saturation 7% Started oral iron supplement with Vit C  Folate wnl  # Hypophosphatemia, Phos repleted, Monitor electrolytes # Vitamin D deficiency: started vitamin D 50,000 units p.o. weekly, follow with PCP to repeat vitamin D level after 3 to 6 months. # Vitamin B12 deficiency: B12 level 207 and goal is >400, Started vitamin B12  1000 mcg IM injection daily during hospital stay, followed by oral supplement.  Follow-up PCP to repeat vitamin B12 level after 3 to 6 months.   Body mass index is 22.43 kg/m.  Interventions:  Diet: Heart Healthy diet DVT Prophylaxis: Hep gtt   Advance goals of care discussion: Full code  Family Communication: family was not present at bedside, at the time of interview.  The pt provided permission to discuss medical plan with  the family. Opportunity was given to ask question and all questions were answered satisfactorily.   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 Discharge to home, when cleared by vascular surgery.  Subjective: No significant events overnight, c/o severe pain in LLE with some improvement after pain meds Denies any other complaints    Physical Exam: General: NAD, lying comfortably Appear in no distress, affect appropriate Eyes: PERRLA ENT: Oral Mucosa Clear, moist  Neck: no JVD,  Cardiovascular: S1 and S2 Present, no Murmur,  Respiratory: good respiratory effort, Bilateral Air entry equal and Decreased, no Crackles, no wheezes Abdomen: Bowel Sound present, Soft and no tenderness,  Skin: no rashes Extremities: s/p Right AKA, stump dressing CDI  LLE slightly cold, no wounds or infection Neurologic: without any new focal findings Gait not checked due to patient safety concerns  Vitals:   12/28/23 2019 12/29/23 0106 12/29/23 0501 12/29/23 0742  BP: (!) 70/50 (!) 93/59 116/67 123/88  Pulse:  67 63 61  Resp:  18 18 16   Temp:  98.8 F (37.1 C) 98.3 F (36.8 C) 98.5 F (36.9 C)  TempSrc:      SpO2:  97% 100% 100%  Weight:      Height:        Intake/Output Summary (Last 24 hours) at 12/29/2023 1336 Last data filed at 12/29/2023 0354 Gross per 24 hour  Intake 1166.43 ml  Output --  Net 1166.43 ml   Filed Weights   12/26/23 1144  Weight: 77.1 kg    Data Reviewed: I have personally reviewed and interpreted daily labs, tele strips, imagings as discussed above. I reviewed all nursing notes, pharmacy notes, vitals, pertinent old records I have discussed plan of care as described above with RN and patient/family.  CBC: Recent Labs  Lab 12/26/23 1231 12/27/23 0912 12/28/23 0108 12/29/23 0227  WBC 9.2 7.9 10.4 10.2   HGB 12.2* 10.5* 8.5* 8.2*  HCT 37.1* 31.8* 26.5* 25.2*  MCV 87.7 90.1 89.8 89.7  PLT 324 272 232 253   Basic Metabolic Panel: Recent Labs  Lab 12/26/23 1231 12/27/23 1640 12/28/23 0108 12/29/23 0227  NA 137 139 136 136  K 4.6 3.7 3.7 3.8  CL 102 107 104 105  CO2 23 21* 21* 23  GLUCOSE 87 181* 233* 106*  BUN 10 10 13 11   CREATININE 0.73 0.77 1.03 0.77  CALCIUM 9.1 8.7* 8.3* 8.3*  MG  --   --  1.7 2.0  PHOS  --   --  2.8 2.4*    Studies: No results found.   Scheduled Meds:  ascorbic acid  500 mg Oral Daily   aspirin EC  81 mg Oral Daily   atorvastatin  40 mg Oral Daily   Chlorhexidine Gluconate Cloth  6 each Topical Q0600   cyanocobalamin  1,000 mcg Intramuscular Daily   Followed by   Melene Muller ON 01/05/2024] vitamin B-12  1,000 mcg  Oral Daily   gabapentin  300 mg Oral BID   iron polysaccharides  150 mg Oral Daily   midodrine  10 mg Oral TID WC   phosphorus  500 mg Oral TID   sodium chloride flush  3 mL Intravenous Q12H   Vitamin D (Ergocalciferol)  50,000 Units Oral Q7 days   Continuous Infusions:  heparin 1,600 Units/hr (12/29/23 1237)   meropenem (MERREM) IV 1 g (12/29/23 1324)   PRN Meds: acetaminophen **OR** acetaminophen, diphenhydrAMINE, HYDROmorphone (DILAUDID) injection, ondansetron **OR** ondansetron (ZOFRAN) IV, oxyCODONE, polyethylene glycol  Time spent: 35 minutes  Author: Gillis Santa. MD [facility] 12/29/2023 1:36 PM  To reach On-call, see care teams to locate the attending and reach out to them via [email or web address]. If 7PM-7AM, please contact night-coverage If you still have difficulty reaching the attending provider, please page the [facility] (Director on Call) for [facility] on amion for assistance.

## 2023-12-29 NOTE — Progress Notes (Signed)
 [facility]  Daily Progress Note   Subjective  -   He complains of pain in the right AKA stump and in the left leg.  It is better than when he came to the Hospital but not by much.  The calf is soft but tender as is the ankle.  The foot is mostly numb.  Objective Vitals:   12/28/23 2019 12/29/23 0106 12/29/23 0501 12/29/23 0742  BP: (!) 70/50 (!) 93/59 116/67 123/88  Pulse:  67 63 61  Resp:  18 18 16   Temp:  98.8 F (37.1 C) 98.3 F (36.8 C) 98.5 F (36.9 C)  TempSrc:      SpO2:  97% 100% 100%  Weight:      Height:        Intake/Output Summary (Last 24 hours) at 12/29/2023 1226 Last data filed at 12/29/2023 0354 Gross per 24 hour  Intake 1166.43 ml  Output --  Net 1166.43 ml    PULM  CTAB CV  RRR VASC  Palpable left femoral pulse.  Doppler signals in popliteal and PTA but no palpable pulses there.  No Doppler in foot DP location or peroneal.  Foot is slightly warm with no mottling or ulcers.  Motor function is present.  Laboratory CBC    Component Value Date/Time   WBC 10.2 12/29/2023 0227   HGB 8.2 (L) 12/29/2023 0227   HCT 25.2 (L) 12/29/2023 0227   PLT 253 12/29/2023 0227    BMET    Component Value Date/Time   NA 136 12/29/2023 0227   K 3.8 12/29/2023 0227   CL 105 12/29/2023 0227   CO2 23 12/29/2023 0227   GLUCOSE 106 (H) 12/29/2023 0227   BUN 11 12/29/2023 0227   CREATININE 0.77 12/29/2023 0227   CALCIUM 8.3 (L) 12/29/2023 0227   GFRNONAA >60 12/29/2023 0227   GFRAA >60 12/04/2019 0523    Assessment/Planning: POD #2 s/p thrombectomy of left iliac occlusion with iliac stent placement and PFA only runoff.  Occluded and stented left SFA remains occluded.  Marginal perfusion to left foot.  I am concerned some of his complaints may be ongoing chronic ischemic pain as well as the typical nerve injury with sub-optimal recovery.  I have reviewed his images.  Consideration for repeat arteriography for further left SFA revascularization  is suggested.  The iliac has been successfully revascularized and there is no immediate foot threat this weekend. Non-healed right AKA stump.  Dressing is clean and will change tomorrow.  Iline Oven  12/29/2023, 12:26 PM

## 2023-12-29 NOTE — Consult Note (Signed)
 PHARMACY - ANTICOAGULATION CONSULT NOTE  Pharmacy Consult for heparin infusion Indication:  ischemic limb  No Known Allergies  Patient Measurements: Height: 6\' 1"  (185.4 cm) Weight: 77.1 kg (170 lb) IBW/kg (Calculated) : 79.9 Heparin Dosing Weight: 77.1 kg  Vital Signs: Temp: 98.5 F (36.9 C) (03/15 0742) BP: 123/88 (03/15 0742) Pulse Rate: 61 (03/15 0742)  Labs: Recent Labs    12/26/23 1231 12/26/23 1259 12/26/23 1922 12/27/23 0912 12/27/23 1640 12/27/23 1909 12/28/23 0108 12/29/23 0227 12/29/23 1052  HGB 12.2*  --   --  10.5*  --   --  8.5* 8.2*  --   HCT 37.1*  --   --  31.8*  --   --  26.5* 25.2*  --   PLT 324  --   --  272  --   --  232 253  --   APTT 34 35  --   --   --   --   --   --   --   LABPROT  --  13.4  --   --   --   --   --   --   --   INR  --  1.0  --   --   --   --   --   --   --   HEPARINUNFRC 0.44  --    < >  --   --    < > 0.41 0.24* 0.14*  CREATININE 0.73  --   --   --  0.77  --  1.03 0.77  --    < > = values in this interval not displayed.   Estimated Creatinine Clearance: 103.1 mL/min (by C-G formula based on SCr of 0.77 mg/dL).  Medical History: Past Medical History:  Diagnosis Date   Anxiety    situational   Asthma    as a child   Atherosclerosis of native arteries of extremity with intermittent claudication (HCC)    Cellulitis and abscess of toe of right foot    Critical limb ischemia of right lower extremity (HCC)    Current smoker    Quit 04/16/23   Peripheral arterial disease (HCC)    Medications:  Patient prescribed Eliquis, patient reports taking dose on 3/12  Assessment: 63 yo male sent to ED from vascular surgery office due to concern of left lower extremity ischemia. PMH includes PAD, neuropathy, and recent  AKA on right.  Pharmacy consulted for initiation of heparin infusion.  Goal of Therapy:  Heparin level 0.3-0.7 units/mL  Monitor platelets by anticoagulation protocol: Yes  Labs: 0313 1909 HL 0.44  Therapeutic x 1  on previously therapeutic rate of 1250 units/hr  0314 0108 HL 0.41, Therapeutic X 2  0315 0227 HL 0.24, SUBtherapeutic 0315 1052 HL 0.14, SUBtherapeutic    Plan:  -- Will order heparin 2300 units IV X 1 bolus -- Increase drip rate to 1600 units from 1400 units/hr -- Will recheck HL 6 hrs after rate change  -- Continue to check daily CBC while on heparin  Effie Shy, PharmD Pharmacy Resident  12/29/2023 11:49 AM

## 2023-12-29 NOTE — Consult Note (Signed)
 PHARMACY - ANTICOAGULATION CONSULT NOTE  Pharmacy Consult for heparin infusion Indication:  ischemic limb  No Known Allergies  Patient Measurements: Height: 6\' 1"  (185.4 cm) Weight: 77.1 kg (170 lb) IBW/kg (Calculated) : 79.9 Heparin Dosing Weight: 77.1 kg  Vital Signs: Temp: 98.8 F (37.1 C) (03/15 0106) BP: 93/59 (03/15 0106) Pulse Rate: 67 (03/15 0106)  Labs: Recent Labs    12/26/23 1231 12/26/23 1259 12/26/23 1922 12/27/23 0912 12/27/23 1640 12/27/23 1909 12/28/23 0108 12/29/23 0227  HGB 12.2*  --   --  10.5*  --   --  8.5* 8.2*  HCT 37.1*  --   --  31.8*  --   --  26.5* 25.2*  PLT 324  --   --  272  --   --  232 253  APTT 34 35  --   --   --   --   --   --   LABPROT  --  13.4  --   --   --   --   --   --   INR  --  1.0  --   --   --   --   --   --   HEPARINUNFRC 0.44  --    < >  --   --  0.44 0.41 0.24*  CREATININE 0.73  --   --   --  0.77  --  1.03 0.77   < > = values in this interval not displayed.   Estimated Creatinine Clearance: 103.1 mL/min (by C-G formula based on SCr of 0.77 mg/dL).  Medical History: Past Medical History:  Diagnosis Date   Anxiety    situational   Asthma    as a child   Atherosclerosis of native arteries of extremity with intermittent claudication (HCC)    Cellulitis and abscess of toe of right foot    Critical limb ischemia of right lower extremity (HCC)    Current smoker    Quit 04/16/23   Peripheral arterial disease (HCC)    Medications:  Patient prescribed Eliquis, patient reports taking dose on 3/12  Assessment: 63 yo male sent to ED from vascular surgery office due to concern of left lower extremity ischemia.  PMH includes PAD, neuropathy, and recent  AKA on right.  Pharmacy consulted for initiation of heparin infusion.  Goal of Therapy:  Heparin level 0.3-0.7 units/mL  Monitor platelets by anticoagulation protocol: Yes  Labs: 0313 1909 HL 0.44 - therapeutic x 1 on previously therapeutic rate of 1250 units/hr  0314  0108 HL 0.41, therapeutic X 2    Plan:  3/15:  HL @ 0227 = 0.24, SUBtherapeutic - Will order heparin 1150 units IV X 1 bolus and increase drip rate to 1400 units/hr - Will recheck HL 6 hrs after rate change  Daily CBC while on heparin  Traveion Ruddock D, PharmD 12/29/2023 4:23 AM

## 2023-12-29 NOTE — Plan of Care (Signed)
  Problem: Nutrition: Goal: Adequate nutrition will be maintained Outcome: Progressing   Problem: Coping: Goal: Level of anxiety will decrease Outcome: Progressing   Problem: Skin Integrity: Goal: Risk for impaired skin integrity will decrease Outcome: Progressing   Problem: Education: Goal: Knowledge of General Education information will improve Description: Including pain rating scale, medication(s)/side effects and non-pharmacologic comfort measures Outcome: Progressing

## 2023-12-29 NOTE — Consult Note (Signed)
 PHARMACY - ANTICOAGULATION CONSULT NOTE  Pharmacy Consult for heparin infusion Indication:  ischemic limb  No Known Allergies  Patient Measurements: Height: 6\' 1"  (185.4 cm) Weight: 77.1 kg (170 lb) IBW/kg (Calculated) : 79.9 Heparin Dosing Weight: 77.1 kg  Vital Signs: Temp: 98.5 F (36.9 C) (03/15 1503) BP: 106/75 (03/15 1503) Pulse Rate: 56 (03/15 1503)  Labs: Recent Labs    12/27/23 0912 12/27/23 1640 12/27/23 1909 12/28/23 0108 12/29/23 0227 12/29/23 1052 12/29/23 1845  HGB 10.5*  --   --  8.5* 8.2*  --   --   HCT 31.8*  --   --  26.5* 25.2*  --   --   PLT 272  --   --  232 253  --   --   HEPARINUNFRC  --   --    < > 0.41 0.24* 0.14* 0.41  CREATININE  --  0.77  --  1.03 0.77  --   --    < > = values in this interval not displayed.   Estimated Creatinine Clearance: 103.1 mL/min (by C-G formula based on SCr of 0.77 mg/dL).  Medical History: Past Medical History:  Diagnosis Date   Anxiety    situational   Asthma    as a child   Atherosclerosis of native arteries of extremity with intermittent claudication (HCC)    Cellulitis and abscess of toe of right foot    Critical limb ischemia of right lower extremity (HCC)    Current smoker    Quit 04/16/23   Peripheral arterial disease (HCC)    Medications:  Patient prescribed Eliquis, patient reports taking dose on 3/12  Assessment: 63 yo male sent to ED from vascular surgery office due to concern of left lower extremity ischemia. PMH includes PAD, neuropathy, and recent  AKA on right.  Pharmacy consulted for initiation of heparin infusion.  Goal of Therapy:  Heparin level 0.3-0.7 units/mL  Monitor platelets by anticoagulation protocol: Yes  Labs: 0313 1909 HL 0.44  Therapeutic x 1 on previously therapeutic rate of 1250 units/hr  0314 0108 HL 0.41, Therapeutic X 2  0315 0227 HL 0.24, SUBtherapeutic 0315 1052 HL 0.14, SUBtherapeutic  0315 1845 HL 0.41, Therapeutic x 1   Plan:  -- Continue heparin infusion  rate of 1600 units/hr -- Will check confirmatory HL in 6 hours -- Continue to check daily CBC while on heparin  Merryl Hacker, PharmD Clinical Pharmacist  12/29/2023 7:27 PM

## 2023-12-29 NOTE — Plan of Care (Signed)
  Problem: Clinical Measurements: Goal: Diagnostic test results will improve Outcome: Progressing   Problem: Activity: Goal: Risk for activity intolerance will decrease Outcome: Progressing   Problem: Nutrition: Goal: Adequate nutrition will be maintained Outcome: Progressing   Problem: Coping: Goal: Level of anxiety will decrease Outcome: Progressing   Problem: Safety: Goal: Ability to remain free from injury will improve Outcome: Progressing

## 2023-12-30 DIAGNOSIS — I70222 Atherosclerosis of native arteries of extremities with rest pain, left leg: Secondary | ICD-10-CM | POA: Diagnosis not present

## 2023-12-30 LAB — BASIC METABOLIC PANEL
Anion gap: 8 (ref 5–15)
BUN: 6 mg/dL — ABNORMAL LOW (ref 8–23)
CO2: 25 mmol/L (ref 22–32)
Calcium: 8.6 mg/dL — ABNORMAL LOW (ref 8.9–10.3)
Chloride: 105 mmol/L (ref 98–111)
Creatinine, Ser: 0.68 mg/dL (ref 0.61–1.24)
GFR, Estimated: >60 mL/min (ref >60)
Glucose, Bld: 121 mg/dL — ABNORMAL HIGH (ref 70–99)
Potassium: 3.6 mmol/L (ref 3.5–5.1)
Sodium: 138 mmol/L (ref 135–145)

## 2023-12-30 LAB — CBC
HCT: 24.5 % — ABNORMAL LOW (ref 39.0–52.0)
Hemoglobin: 8 g/dL — ABNORMAL LOW (ref 13.0–17.0)
MCH: 28.8 pg (ref 26.0–34.0)
MCHC: 32.7 g/dL (ref 30.0–36.0)
MCV: 88.1 fL (ref 80.0–100.0)
Platelets: 261 10*3/uL (ref 150–400)
RBC: 2.78 MIL/uL — ABNORMAL LOW (ref 4.22–5.81)
RDW: 15 % (ref 11.5–15.5)
WBC: 9.4 10*3/uL (ref 4.0–10.5)
nRBC: 0 % (ref 0.0–0.2)

## 2023-12-30 LAB — PHOSPHORUS: Phosphorus: 3.5 mg/dL (ref 2.5–4.6)

## 2023-12-30 LAB — MAGNESIUM: Magnesium: 1.8 mg/dL (ref 1.7–2.4)

## 2023-12-30 LAB — HEPARIN LEVEL (UNFRACTIONATED)
Heparin Unfractionated: <0.1 [IU]/mL — ABNORMAL LOW (ref 0.30–0.70)
Heparin Unfractionated: 0.39 [IU]/mL (ref 0.30–0.70)
Heparin Unfractionated: 0.4 [IU]/mL (ref 0.30–0.70)

## 2023-12-30 MED ORDER — HEPARIN BOLUS VIA INFUSION
2300.0000 [IU] | Freq: Once | INTRAVENOUS | Status: AC
  Administered 2023-12-30: 2300 [IU] via INTRAVENOUS
  Filled 2023-12-30: qty 2300

## 2023-12-30 NOTE — Consult Note (Signed)
 PHARMACY - ANTICOAGULATION CONSULT NOTE  Pharmacy Consult for heparin infusion Indication:  ischemic limb  No Known Allergies  Patient Measurements: Height: 6\' 1"  (185.4 cm) Weight: 77.1 kg (170 lb) IBW/kg (Calculated) : 79.9 Heparin Dosing Weight: 77.1 kg  Vital Signs: Temp: 98.5 F (36.9 C) (03/16 0850) Temp Source: Oral (03/16 0506) BP: 110/57 (03/16 0850) Pulse Rate: 63 (03/16 0850)  Labs: Recent Labs    12/28/23 0108 12/29/23 0227 12/29/23 1052 12/29/23 1845 12/30/23 0047 12/30/23 0451 12/30/23 0800  HGB 8.5* 8.2*  --   --   --  8.0*  --   HCT 26.5* 25.2*  --   --   --  24.5*  --   PLT 232 253  --   --   --  261  --   HEPARINUNFRC 0.41 0.24*   < > 0.41 <0.10*  --  0.39  CREATININE 1.03 0.77  --   --   --  0.68  --    < > = values in this interval not displayed.   Estimated Creatinine Clearance: 103.1 mL/min (by C-G formula based on SCr of 0.68 mg/dL).  Medical History: Past Medical History:  Diagnosis Date   Anxiety    situational   Asthma    as a child   Atherosclerosis of native arteries of extremity with intermittent claudication (HCC)    Cellulitis and abscess of toe of right foot    Critical limb ischemia of right lower extremity (HCC)    Current smoker    Quit 04/16/23   Peripheral arterial disease (HCC)    Medications:  Patient prescribed Eliquis, patient reports taking dose on 3/12  Assessment: 63 yo male sent to ED from vascular surgery office due to concern of left lower extremity ischemia. PMH includes PAD, neuropathy, and recent  AKA on right.  Pharmacy consulted for initiation of heparin infusion.  Goal of Therapy:  Heparin level 0.3-0.7 units/mL  Monitor platelets by anticoagulation protocol: Yes  Labs: 0313 1909 HL 0.44  Therapeutic x 1 on previously therapeutic rate of 1250 units/hr  0314 0108 HL 0.41, Therapeutic X 2  0315 0227 HL 0.24, SUBtherapeutic 0315 1052 HL 0.14, SUBtherapeutic  0315 1845 HL 0.41, Therapeutic x 1 0316  0047 HL < 0.1 SUBtherapeutic 0316 0800 HL 0.39, Therapeutic x 1   Plan:  - Will continue heparin drip rate of 1900 units/hr - Will check confirmatory HL in 6 hrs  - Continue to check daily CBC while on heparin  Bettey Costa, PharmD Clinical Pharmacist  12/30/2023 9:50 AM

## 2023-12-30 NOTE — Progress Notes (Signed)
 PT Cancellation Note  Patient Details Name: Brendan Williams MRN: [id number] DOB: 09-Aug-1961   Cancelled Treatment:    Reason Eval/Treat Not Completed: Patient declined, no reason specified;Other (comment) Patient reports he is getting up and around in room. L LE still hurting. He would like to get in shower soon. Declines any PT needs at this time. Per eval patient was having difficulty getting up steps at home, but reports a ramp is being built. Will check in on Monday and potentially sign off.      Tymeer Vaquera 12/30/2023, 10:35 AM

## 2023-12-30 NOTE — Progress Notes (Signed)
 [facility]  Daily Progress Note   Subjective  -   Yesterday he noted a rash in the left calf over the anterior compartment region and the a patch in the posterior calf associated with exquisite tenderness.  He also noted his foot felt much warmer than when I had come to see him.  The rash with localized skin edema is persistent and extremely tender to touch and is red.  I did not appreciate this on exam yesterday.  I think it is new.  No fever or chills.  No other new complaints.  Objective Vitals:   12/29/23 1503 12/29/23 2004 12/30/23 0506 12/30/23 0850  BP: 106/75 (!) 118/94 115/62 (!) 110/57  Pulse: (!) 56 (!) 59 63 63  Resp: 16 18 16 17   Temp: 98.5 F (36.9 C) 98.7 F (37.1 C) 98.3 F (36.8 C) 98.5 F (36.9 C)  TempSrc:   Oral   SpO2: 100% 100% 100% 100%  Weight:      Height:        Intake/Output Summary (Last 24 hours) at 12/30/2023 1004 Last data filed at 12/30/2023 0442 Gross per 24 hour  Intake 734.78 ml  Output 1 ml  Net 733.78 ml    PULM  CTAB CV  RRR VASC  Palpable femoral pulse on left.  Warm leg and foot.  Area of redness and skin edema (rash) over the anterior compartment centrally (15 cm by 5 cm ovoid area) and posterior medial calf (5 by 5 cm circular) associated with hyperalgesia.  The calf is soft and touch elsewhere over the muscle does not elicit tenderness.  Left AKA dressing is clean and dry.  Laboratory CBC    Component Value Date/Time   WBC 9.4 12/30/2023 0451   HGB 8.0 (L) 12/30/2023 0451   HCT 24.5 (L) 12/30/2023 0451   PLT 261 12/30/2023 0451    BMET    Component Value Date/Time   NA 138 12/30/2023 0451   K 3.6 12/30/2023 0451   CL 105 12/30/2023 0451   CO2 25 12/30/2023 0451   GLUCOSE 121 (H) 12/30/2023 0451   BUN 6 (L) 12/30/2023 0451   CREATININE 0.68 12/30/2023 0451   CALCIUM 8.6 (L) 12/30/2023 0451   GFRNONAA >60 12/30/2023 0451   GFRAA >60 12/04/2019 0523    Assessment/Planning: POD #3 s/p Left  iliac artery recanalization.  Chronic right AKA wound with non-healing.  Yesterday I feared his left calf pain was from ongoing ischemia (rest pain equivalent) but today he has a definite rash which appears to be what is so tender.  I am not sure of the etiology of the rash.  I doubt atheroembolization and more suspect cellulitis but he has been on antibiotics and still is.  For now we will closely observe.  Planned OR tomorrow for right stump debridement.  NPO ordered and consent obtained and in chart.  Iline Oven  12/30/2023, 10:04 AM

## 2023-12-30 NOTE — Plan of Care (Signed)

## 2023-12-30 NOTE — Consult Note (Signed)
 PHARMACY - ANTICOAGULATION CONSULT NOTE  Pharmacy Consult for heparin infusion Indication:  ischemic limb  No Known Allergies  Patient Measurements: Height: 6\' 1"  (185.4 cm) Weight: 77.1 kg (170 lb) IBW/kg (Calculated) : 79.9 Heparin Dosing Weight: 77.1 kg  Vital Signs: Temp: 98.7 F (37.1 C) (03/15 2004) BP: 118/94 (03/15 2004) Pulse Rate: 59 (03/15 2004)  Labs: Recent Labs    12/27/23 0912 12/27/23 1640 12/27/23 1909 12/28/23 0108 12/29/23 0227 12/29/23 1052 12/29/23 1845 12/30/23 0047  HGB 10.5*  --   --  8.5* 8.2*  --   --   --   HCT 31.8*  --   --  26.5* 25.2*  --   --   --   PLT 272  --   --  232 253  --   --   --   HEPARINUNFRC  --   --    < > 0.41 0.24* 0.14* 0.41 <0.10*  CREATININE  --  0.77  --  1.03 0.77  --   --   --    < > = values in this interval not displayed.   Estimated Creatinine Clearance: 103.1 mL/min (by C-G formula based on SCr of 0.77 mg/dL).  Medical History: Past Medical History:  Diagnosis Date   Anxiety    situational   Asthma    as a child   Atherosclerosis of native arteries of extremity with intermittent claudication (HCC)    Cellulitis and abscess of toe of right foot    Critical limb ischemia of right lower extremity (HCC)    Current smoker    Quit 04/16/23   Peripheral arterial disease (HCC)    Medications:  Patient prescribed Eliquis, patient reports taking dose on 3/12  Assessment: 63 yo male sent to ED from vascular surgery office due to concern of left lower extremity ischemia. PMH includes PAD, neuropathy, and recent  AKA on right.  Pharmacy consulted for initiation of heparin infusion.  Goal of Therapy:  Heparin level 0.3-0.7 units/mL  Monitor platelets by anticoagulation protocol: Yes  Labs: 0313 1909 HL 0.44  Therapeutic x 1 on previously therapeutic rate of 1250 units/hr  0314 0108 HL 0.41, Therapeutic X 2  0315 0227 HL 0.24, SUBtherapeutic 0315 1052 HL 0.14, SUBtherapeutic  0315 1845 HL 0.41, Therapeutic x  1 0316 0047 HL < 0.1 SUBtherapeutic   Plan:  3/16:  HL @ 0047 = < 0.1, SUBtherapeutic - RN reports heparin gtt has been running without any interruptions - Will order heparin 2300 units IV X 1 bolus and increase drip rate to 1900 units/hr - Will recheck HL 6 hrs after rate change  -- Continue to check daily CBC while on heparin  Kaj Vasil D, PharmD Clinical Pharmacist  12/30/2023 1:53 AM

## 2023-12-30 NOTE — Progress Notes (Signed)
 [facility] Progress Note  Patient: Brendan Williams    LOV:[id number]  DOA: 12/26/2023     Date of Service: [facility] patient was seen and examined on 12/30/2023  Chief Complaint  Patient presents with   Leg Pain   Brief hospital course: Brendan Williams is a 63 y.o. male with medical history significant of severe PAD s/p stenting and right AKA (Dec 2024), tobacco use disorder, neuropathy who presents to [facility] ED 2/2 leg pain.   [facility] Williams states that 2 days ago, he was standing in his kitchen when he had sudden onset pain in his left lower leg.  Pain has progressively worsened since then and he has noticed that his leg was very cold.  He denies any other symptoms including nausea, vomiting, diarrhea, chest pain, shortness of breath.  He notes that he has been having wound issues with his right stump and it has been draining but he has not noticed any odor or redness.   ED Course:  On arrival to [facility] ED, patient was normotensive at 117/84 with HR of 69. He was saturating at 100% on room air. He was afebrile at 98.4. Initial work up notable for hemoglobin of 12.2 and unremarkable CMP. INR and PTT within normal limits. Patient started on heparin infusion and Vascular surgery consulted. [facility] contacted for admission.    Assessment and Plan:  # Critical limb ischemia of left lower extremity As per patient pain started 2 days ago and it is getting worse. Currently patient is on Eliquis which has been held Continue statin, continue pain meds prn Continue heparin IV infusion, pharmacy consulted for dosing and PTT monitoring as per protocol. Vascular surgery consulted, On 3/13 s/p Agio: Mechanical thrombectomy of [facility] left common and external iliac arteries, left common femoral and profunda femoris arteries with stent placement to [facility]rendan left external iliac artery and stent placement to [facility]rendan aorta and most proximal common iliac artery.  3/15 as vasc Williams may need repeat arteriography for further left SFA  revascularization which can be done on Monday   # PAD (peripheral artery disease)  Extensive PAD requiring multiple angiographies and subsequent right AKA in December 2024.  He has only missed 1 day of Eliquis in [facility] last week but reports otherwise good compliance.  He is no longer smoking. - Management of critical limb ischemia as noted above   # Non-healing wound of amputation stump Unfortunately, patient has had poor wound healing of his left AKA stump.  appears worse than 3 weeks ago with increased drainage.   Previous culture data with Pseudomonas and E. coli.  MRSA PCR negative.  S/p Bactrim and ciprofloxacin given as an outpatient S/p Zyvox and cefepime d/c'd on 3/13 3/13 discussed with pharmacy, recommended to start meropenem, no need of MRSA coverage at this time. MRSA PCR negative  3/16 as per vascular surgery, patient will be taken to [facility] OR on Monday for washout and keep NPO after MN. Follow vascular surgery for washout and OR culture data   # Hypotension 3/14 started midodrine 10 mg p.o. 3 times daily with holding parameters  # Neuropathy Continue home gabapentin  # Anemia, Iron deficiency, transferrin saturation 7% Started oral iron supplement with Vit C  Folate wnl  # Hypophosphatemia, Phos repleted, Monitor electrolytes # Vitamin D deficiency: started vitamin D 50,000 units p.o. weekly, follow with PCP to repeat vitamin D level after 3 to 6 months. # Vitamin B12 deficiency: B12 level 207 and goal is >400, Started vitamin B12  1000 mcg IM injection daily during hospital stay, followed by oral supplement.  Follow-up PCP to repeat vitamin B12 level after 3 to 6 months.   Body mass index is 22.43 kg/m.  Interventions:  Diet: Heart Healthy diet DVT Prophylaxis: Hep gtt   Advance goals of care discussion: Full code  Family Communication: family was not present at bedside, at [facility] time of interview.  Brendan Williams provided permission to discuss medical plan with Brendan family.  Opportunity was given to ask question and all questions were answered satisfactorily.   Disposition:  Williams is from home, admitted with critical limb ischemia of left lower extremity, chronic nonhealing wound of right AKA stump, scheduled for angioplasty today, which precludes a safe discharge. Discharge to home, when cleared by vascular surgery.  Subjective: No significant events overnight, c/o Pain in [facility] LLE 10/10, Denies any other complaints  Williams was sitting comfortably   Physical Exam: General: NAD, lying comfortably Appear in no distress, affect appropriate Eyes: PERRLA ENT: Oral Mucosa Clear, moist  Neck: no JVD,  Cardiovascular: S1 and S2 Present, no Murmur,  Respiratory: good respiratory effort, Bilateral Air entry equal and Decreased, no Crackles, no wheezes Abdomen: Bowel Sound present, Soft and no tenderness,  Skin: no rashes Extremities: s/p Right AKA, stump dressing CDI  LLE slightly cold, no wounds or infection Neurologic: without any new focal findings Gait not checked due to patient safety concerns  Vitals:   12/29/23 1503 12/29/23 2004 12/30/23 0506 12/30/23 0850  BP: 106/75 (!) 118/94 115/62 (!) 110/57  Pulse: (!) 56 (!) 59 63 63  Resp: 16 18 16 17   Temp: 98.5 F (36.9 C) 98.7 F (37.1 C) 98.3 F (36.8 C) 98.5 F (36.9 C)  TempSrc:   Oral   SpO2: 100% 100% 100% 100%  Weight:      Height:        Intake/Output Summary (Last 24 hours) at 12/30/2023 1246 Last data filed at 12/30/2023 0442 Gross per 24 hour  Intake 734.78 ml  Output 1 ml  Net 733.78 ml   Filed Weights   12/26/23 1144  Weight: 77.1 kg    Data Reviewed: I have personally reviewed and interpreted daily labs, tele strips, imagings as discussed above. I reviewed all nursing notes, pharmacy notes, vitals, pertinent old records I have discussed plan of care as described above with RN and patient/family.  CBC: Recent Labs  Lab 12/26/23 1231 12/27/23 0912 12/28/23 0108 12/29/23 0227  12/30/23 0451  WBC 9.2 7.9 10.4 10.2 9.4  HGB 12.2* 10.5* 8.5* 8.2* 8.0*  HCT 37.1* 31.8* 26.5* 25.2* 24.5*  MCV 87.7 90.1 89.8 89.7 88.1  PLT 324 272 232 253 261   Basic Metabolic Panel: Recent Labs  Lab 12/26/23 1231 12/27/23 1640 12/28/23 0108 12/29/23 0227 12/30/23 0451  NA 137 139 136 136 138  K 4.6 3.7 3.7 3.8 3.6  CL 102 107 104 105 105  CO2 23 21* 21* 23 25  GLUCOSE 87 181* 233* 106* 121*  BUN 10 10 13 11  6*  CREATININE 0.73 0.77 1.03 0.77 0.68  CALCIUM 9.1 8.7* 8.3* 8.3* 8.6*  MG  --   --  1.7 2.0 1.8  PHOS  --   --  2.8 2.4* 3.5    Studies: No results found.   Scheduled Meds:  ascorbic acid  500 mg Oral Daily   aspirin EC  81 mg Oral Daily   atorvastatin  40 mg Oral Daily   Chlorhexidine Gluconate Cloth  6 each  Topical Q0600   cyanocobalamin  1,000 mcg Intramuscular Daily   Followed by   Melene Muller ON 01/05/2024] vitamin B-12  1,000 mcg Oral Daily   gabapentin  300 mg Oral BID   iron polysaccharides  150 mg Oral Daily   midodrine  10 mg Oral TID WC   sodium chloride flush  3 mL Intravenous Q12H   Vitamin D (Ergocalciferol)  50,000 Units Oral Q7 days   Continuous Infusions:  heparin 1,900 Units/hr (12/30/23 0851)   meropenem (MERREM) IV 1 g (12/30/23 0505)   PRN Meds: acetaminophen **OR** acetaminophen, diphenhydrAMINE, HYDROmorphone (DILAUDID) injection, ondansetron **OR** ondansetron (ZOFRAN) IV, oxyCODONE, polyethylene glycol  Time spent: 35 minutes  Author: Gillis Santa. MD [facility] 12/30/2023 12:46 PM  To reach On-call, see care teams to locate [facility] attending and reach out to them via [email or web address]. If 7PM-7AM, please contact night-coverage If you still have difficulty reaching Brendan attending provider, please page [facility] (Director on Call) for [facility] on amion for assistance.

## 2023-12-30 NOTE — Plan of Care (Signed)
  Problem: Clinical Measurements: Goal: Ability to maintain clinical measurements within normal limits will improve Outcome: Progressing Goal: Diagnostic test results will improve Outcome: Progressing   Problem: Activity: Goal: Risk for activity intolerance will decrease Outcome: Progressing   Problem: Nutrition: Goal: Adequate nutrition will be maintained Outcome: Progressing   Problem: Coping: Goal: Level of anxiety will decrease Outcome: Progressing   Problem: Pain Managment: Goal: General experience of comfort will improve and/or be controlled Outcome: Progressing   Problem: Safety: Goal: Ability to remain free from injury will improve Outcome: Progressing

## 2023-12-30 NOTE — Consult Note (Signed)
 PHARMACY - ANTICOAGULATION CONSULT NOTE  Pharmacy Consult for heparin infusion Indication:  ischemic limb  No Known Allergies  Patient Measurements: Height: 6\' 1"  (185.4 cm) Weight: 77.1 kg (170 lb) IBW/kg (Calculated) : 79.9 Heparin Dosing Weight: 77.1 kg  Vital Signs: Temp: 98.5 F (36.9 C) (03/16 0850) Temp Source: Oral (03/16 0506) BP: 110/57 (03/16 0850) Pulse Rate: 63 (03/16 0850)  Labs: Recent Labs    12/28/23 0108 12/29/23 0227 12/29/23 1052 12/30/23 0047 12/30/23 0451 12/30/23 0800 12/30/23 1334  HGB 8.5* 8.2*  --   --  8.0*  --   --   HCT 26.5* 25.2*  --   --  24.5*  --   --   PLT 232 253  --   --  261  --   --   HEPARINUNFRC 0.41 0.24*   < > <0.10*  --  0.39 0.40  CREATININE 1.03 0.77  --   --  0.68  --   --    < > = values in this interval not displayed.   Estimated Creatinine Clearance: 103.1 mL/min (by C-G formula based on SCr of 0.68 mg/dL).  Medical History: Past Medical History:  Diagnosis Date   Anxiety    situational   Asthma    as a child   Atherosclerosis of native arteries of extremity with intermittent claudication (HCC)    Cellulitis and abscess of toe of right foot    Critical limb ischemia of right lower extremity (HCC)    Current smoker    Quit 04/16/23   Peripheral arterial disease (HCC)    Medications:  Patient prescribed Eliquis, patient reports taking dose on 3/12  Assessment: 63 yo male sent to ED from vascular surgery office due to concern of left lower extremity ischemia. PMH includes PAD, neuropathy, and recent  AKA on right.  Pharmacy consulted for initiation of heparin infusion.  Goal of Therapy:  Heparin level 0.3-0.7 units/mL  Monitor platelets by anticoagulation protocol: Yes  Labs: 0313 1909 HL 0.44  Therapeutic x 1 on previously therapeutic rate of 1250 units/hr  0314 0108 HL 0.41, Therapeutic X 2  0315 0227 HL 0.24, SUBtherapeutic 0315 1052 HL 0.14, SUBtherapeutic  0315 1845 HL 0.41, Therapeutic x 1 0316  0047 HL < 0.1 SUBtherapeutic 0316 0800 HL 0.39, Therapeutic x 1 0316 1334 HL 0.40, Therapeutic x 2   Plan:  - Will continue heparin drip rate of 1900 units/hr - Will check heparin level with morning labs - Continue to check daily CBC while on heparin  Bettey Costa, PharmD Clinical Pharmacist  12/30/2023 2:35 PM

## 2023-12-30 NOTE — H&P (View-Only) (Signed)
 [facility]  Daily Progress Note   Subjective  -   Yesterday he noted a rash in the left calf over the anterior compartment region and the a patch in the posterior calf associated with exquisite tenderness.  He also noted his foot felt much warmer than when I had come to see him.  The rash with localized skin edema is persistent and extremely tender to touch and is red.  I did not appreciate this on exam yesterday.  I think it is new.  No fever or chills.  No other new complaints.  Objective Vitals:   12/29/23 1503 12/29/23 2004 12/30/23 0506 12/30/23 0850  BP: 106/75 (!) 118/94 115/62 (!) 110/57  Pulse: (!) 56 (!) 59 63 63  Resp: 16 18 16 17   Temp: 98.5 F (36.9 C) 98.7 F (37.1 C) 98.3 F (36.8 C) 98.5 F (36.9 C)  TempSrc:   Oral   SpO2: 100% 100% 100% 100%  Weight:      Height:        Intake/Output Summary (Last 24 hours) at 12/30/2023 1004 Last data filed at 12/30/2023 0442 Gross per 24 hour  Intake 734.78 ml  Output 1 ml  Net 733.78 ml    PULM  CTAB CV  RRR VASC  Palpable femoral pulse on left.  Warm leg and foot.  Area of redness and skin edema (rash) over the anterior compartment centrally (15 cm by 5 cm ovoid area) and posterior medial calf (5 by 5 cm circular) associated with hyperalgesia.  The calf is soft and touch elsewhere over the muscle does not elicit tenderness.  Left AKA dressing is clean and dry.  Laboratory CBC    Component Value Date/Time   WBC 9.4 12/30/2023 0451   HGB 8.0 (L) 12/30/2023 0451   HCT 24.5 (L) 12/30/2023 0451   PLT 261 12/30/2023 0451    BMET    Component Value Date/Time   NA 138 12/30/2023 0451   K 3.6 12/30/2023 0451   CL 105 12/30/2023 0451   CO2 25 12/30/2023 0451   GLUCOSE 121 (H) 12/30/2023 0451   BUN 6 (L) 12/30/2023 0451   CREATININE 0.68 12/30/2023 0451   CALCIUM 8.6 (L) 12/30/2023 0451   GFRNONAA >60 12/30/2023 0451   GFRAA >60 12/04/2019 0523    Assessment/Planning: POD #3 s/p Left  iliac artery recanalization.  Chronic right AKA wound with non-healing.  Yesterday I feared his left calf pain was from ongoing ischemia (rest pain equivalent) but today he has a definite rash which appears to be what is so tender.  I am not sure of the etiology of the rash.  I doubt atheroembolization and more suspect cellulitis but he has been on antibiotics and still is.  For now we will closely observe.  Planned OR tomorrow for right stump debridement.  NPO ordered and consent obtained and in chart.  Iline Oven  12/30/2023, 10:04 AM

## 2023-12-31 ENCOUNTER — Inpatient Hospital Stay: Payer: Self-pay | Admitting: Certified Registered Nurse Anesthetist

## 2023-12-31 ENCOUNTER — Other Ambulatory Visit: Payer: Self-pay

## 2023-12-31 ENCOUNTER — Encounter
Admission: EM | Disposition: A | Discharge status: Home Health | Payer: Self-pay | Source: Home / Self Care | Attending: Student

## 2023-12-31 DIAGNOSIS — Z89611 Acquired absence of right leg above knee: Secondary | ICD-10-CM | POA: Diagnosis not present

## 2023-12-31 DIAGNOSIS — T8789 Other complications of amputation stump: Secondary | ICD-10-CM | POA: Diagnosis not present

## 2023-12-31 DIAGNOSIS — Z9889 Other specified postprocedural states: Secondary | ICD-10-CM | POA: Diagnosis not present

## 2023-12-31 DIAGNOSIS — I70222 Atherosclerosis of native arteries of extremities with rest pain, left leg: Secondary | ICD-10-CM | POA: Diagnosis not present

## 2023-12-31 HISTORY — PX: APPLICATION OF WOUND VAC: SHX5189

## 2023-12-31 HISTORY — PX: INCISION AND DRAINAGE OF WOUND: SHX1803

## 2023-12-31 LAB — BASIC METABOLIC PANEL
Anion gap: 7 (ref 5–15)
BUN: 6 mg/dL — ABNORMAL LOW (ref 8–23)
CO2: 25 mmol/L (ref 22–32)
Calcium: 9 mg/dL (ref 8.9–10.3)
Chloride: 106 mmol/L (ref 98–111)
Creatinine, Ser: 0.6 mg/dL — ABNORMAL LOW (ref 0.61–1.24)
GFR, Estimated: >60 mL/min (ref >60)
Glucose, Bld: 137 mg/dL — ABNORMAL HIGH (ref 70–99)
Potassium: 3.5 mmol/L (ref 3.5–5.1)
Sodium: 138 mmol/L (ref 135–145)

## 2023-12-31 LAB — HEPARIN LEVEL (UNFRACTIONATED)
Heparin Unfractionated: <0.1 [IU]/mL — ABNORMAL LOW (ref 0.30–0.70)
Heparin Unfractionated: 0.3 [IU]/mL (ref 0.30–0.70)

## 2023-12-31 LAB — PHOSPHORUS: Phosphorus: 3.6 mg/dL (ref 2.5–4.6)

## 2023-12-31 LAB — CBC
HCT: 25.3 % — ABNORMAL LOW (ref 39.0–52.0)
Hemoglobin: 8.2 g/dL — ABNORMAL LOW (ref 13.0–17.0)
MCH: 28.7 pg (ref 26.0–34.0)
MCHC: 32.4 g/dL (ref 30.0–36.0)
MCV: 88.5 fL (ref 80.0–100.0)
Platelets: 281 10*3/uL (ref 150–400)
RBC: 2.86 MIL/uL — ABNORMAL LOW (ref 4.22–5.81)
RDW: 14.9 % (ref 11.5–15.5)
WBC: 9.1 10*3/uL (ref 4.0–10.5)
nRBC: 0 % (ref 0.0–0.2)

## 2023-12-31 LAB — MAGNESIUM: Magnesium: 2 mg/dL (ref 1.7–2.4)

## 2023-12-31 SURGERY — IRRIGATION AND DEBRIDEMENT WOUND
Anesthesia: Monitor Anesthesia Care | Laterality: Right

## 2023-12-31 MED ORDER — VANCOMYCIN HCL 1000 MG IV SOLR
INTRAVENOUS | Status: AC
Start: 2023-12-31 — End: ?
  Filled 2023-12-31: qty 20

## 2023-12-31 MED ORDER — PHENYLEPHRINE 80 MCG/ML (10ML) SYRINGE FOR IV PUSH (FOR BLOOD PRESSURE SUPPORT)
PREFILLED_SYRINGE | INTRAVENOUS | Status: DC | PRN
Start: 2023-12-31 — End: 2023-12-31
  Administered 2023-12-31: 80 ug via INTRAVENOUS

## 2023-12-31 MED ORDER — 0.9 % SODIUM CHLORIDE (POUR BTL) OPTIME
TOPICAL | Status: DC | PRN
  Administered 2023-12-31: 1000 mL

## 2023-12-31 MED ORDER — CEFAZOLIN SODIUM-DEXTROSE 2-4 GM/100ML-% IV SOLN
INTRAVENOUS | Status: AC
  Filled 2023-12-31: qty 100

## 2023-12-31 MED ORDER — KETAMINE HCL 50 MG/5ML IJ SOSY
PREFILLED_SYRINGE | INTRAMUSCULAR | Status: DC | PRN
  Administered 2023-12-31: 30 mg via INTRAVENOUS
  Administered 2023-12-31: 20 mg via INTRAVENOUS

## 2023-12-31 MED ORDER — CHLORHEXIDINE GLUCONATE CLOTH 2 % EX PADS: 6.0000 | MEDICATED_PAD | Freq: Once | CUTANEOUS | Status: DC

## 2023-12-31 MED ORDER — FENTANYL CITRATE (PF) 100 MCG/2ML IJ SOLN
INTRAMUSCULAR | Status: AC
  Filled 2023-12-31: qty 2

## 2023-12-31 MED ORDER — KETAMINE HCL 50 MG/5ML IJ SOSY
PREFILLED_SYRINGE | INTRAMUSCULAR | Status: AC
  Filled 2023-12-31: qty 5

## 2023-12-31 MED ORDER — OXYCODONE HCL 5 MG/5ML PO SOLN: 5.0000 mg | Freq: Once | ORAL | Status: DC | PRN

## 2023-12-31 MED ORDER — BUPIVACAINE HCL (PF) 0.5 % IJ SOLN
INTRAMUSCULAR | Status: AC
  Filled 2023-12-31: qty 30

## 2023-12-31 MED ORDER — MIDAZOLAM HCL 2 MG/2ML IJ SOLN
INTRAMUSCULAR | Status: AC
  Filled 2023-12-31: qty 2

## 2023-12-31 MED ORDER — LIDOCAINE HCL (CARDIAC) PF 100 MG/5ML IV SOSY
PREFILLED_SYRINGE | INTRAVENOUS | Status: DC | PRN
  Administered 2023-12-31: 100 mg via INTRAVENOUS

## 2023-12-31 MED ORDER — FENTANYL CITRATE (PF) 100 MCG/2ML IJ SOLN
INTRAMUSCULAR | Status: DC | PRN
  Administered 2023-12-31 (×2): 50 ug via INTRAVENOUS

## 2023-12-31 MED ORDER — ONDANSETRON HCL 4 MG/2ML IJ SOLN
INTRAMUSCULAR | Status: AC
  Filled 2023-12-31: qty 2

## 2023-12-31 MED ORDER — OXYCODONE HCL 5 MG PO TABS: 5.0000 mg | ORAL_TABLET | Freq: Once | ORAL | Status: DC | PRN

## 2023-12-31 MED ORDER — BUPIVACAINE HCL 0.5 % IJ SOLN
INTRAMUSCULAR | Status: DC | PRN
  Administered 2023-12-31: 10 mL

## 2023-12-31 MED ORDER — ACETAMINOPHEN 10 MG/ML IV SOLN: 1000.0000 mg | Freq: Once | INTRAVENOUS | Status: DC | PRN

## 2023-12-31 MED ORDER — PROPOFOL 10 MG/ML IV BOLUS
INTRAVENOUS | Status: DC | PRN
  Administered 2023-12-31: 100 ug/kg/min via INTRAVENOUS
  Administered 2023-12-31: 30 mg via INTRAVENOUS

## 2023-12-31 MED ORDER — SODIUM CHLORIDE 0.9 % IV SOLN: INTRAVENOUS | Status: DC

## 2023-12-31 MED ORDER — DROPERIDOL 2.5 MG/ML IJ SOLN: 0.6250 mg | Freq: Once | INTRAMUSCULAR | Status: DC | PRN

## 2023-12-31 MED ORDER — FENTANYL CITRATE (PF) 100 MCG/2ML IJ SOLN
25.0000 ug | INTRAMUSCULAR | Status: DC | PRN
  Administered 2023-12-31 (×4): 25 ug via INTRAVENOUS

## 2023-12-31 MED ORDER — CEFAZOLIN SODIUM-DEXTROSE 2-4 GM/100ML-% IV SOLN
2.0000 g | INTRAVENOUS | Status: AC
  Administered 2023-12-31: 2 g via INTRAVENOUS

## 2023-12-31 MED ORDER — ONDANSETRON HCL 4 MG/2ML IJ SOLN
INTRAMUSCULAR | Status: DC | PRN
  Administered 2023-12-31: 4 mg via INTRAVENOUS

## 2023-12-31 MED ORDER — VANCOMYCIN HCL 1000 MG IV SOLR
INTRAVENOUS | Status: DC | PRN
  Administered 2023-12-31: 1000 mg

## 2023-12-31 MED ORDER — LIDOCAINE HCL (PF) 2 % IJ SOLN
INTRAMUSCULAR | Status: AC
  Filled 2023-12-31: qty 5

## 2023-12-31 MED ORDER — LACTATED RINGERS IV SOLN: INTRAVENOUS | Status: DC | PRN

## 2023-12-31 MED ORDER — MIDODRINE HCL 5 MG PO TABS
5.0000 mg | ORAL_TABLET | Freq: Three times a day (TID) | ORAL | Status: DC
  Administered 2023-12-31 – 2024-01-01 (×3): 5 mg via ORAL
  Filled 2023-12-31 (×3): qty 1

## 2023-12-31 MED ORDER — MIDAZOLAM HCL 2 MG/2ML IJ SOLN
INTRAMUSCULAR | Status: DC | PRN
  Administered 2023-12-31: 1 mg via INTRAVENOUS

## 2023-12-31 SURGICAL SUPPLY — 36 items
BRUSH SCRUB EZ 4% CHG (MISCELLANEOUS) ×1 IMPLANT
CANISTER WOUND CARE 500ML ATS (WOUND CARE) ×1 IMPLANT
CHLORAPREP W/TINT 26 (MISCELLANEOUS) IMPLANT
DRAPE INCISE IOBAN 66X45 STRL (DRAPES) ×1 IMPLANT
DRAPE LAPAROTOMY 100X77 ABD (DRAPES) ×1 IMPLANT
DRSG VAC GRANUFOAM LG (GAUZE/BANDAGES/DRESSINGS) ×1 IMPLANT
DRSG VAC GRANUFOAM MED (GAUZE/BANDAGES/DRESSINGS) ×1 IMPLANT
ELECT CAUTERY BLADE 6.4 (BLADE) ×1 IMPLANT
ELECT REM PT RETURN 9FT ADLT (ELECTROSURGICAL) ×1 IMPLANT
ELECTRODE REM PT RTRN 9FT ADLT (ELECTROSURGICAL) ×1 IMPLANT
GAUZE 4X4 16PLY ~~LOC~~+RFID DBL (SPONGE) ×1 IMPLANT
GLOVE BIO SURGEON STRL SZ7 (GLOVE) ×2 IMPLANT
GOWN STRL REUS W/ TWL LRG LVL3 (GOWN DISPOSABLE) ×2 IMPLANT
GOWN STRL REUS W/TWL 2XL LVL3 (GOWN DISPOSABLE) ×1 IMPLANT
IV NS 1000ML BAXH (IV SOLUTION) IMPLANT
KIT STIMULAN RAPID CURE 5CC (Orthopedic Implant) IMPLANT
KIT TURNOVER KIT A (KITS) ×1 IMPLANT
LABEL OR SOLS (LABEL) ×1 IMPLANT
MANIFOLD NEPTUNE II (INSTRUMENTS) ×1 IMPLANT
NS IRRIG 1000ML POUR BTL (IV SOLUTION) ×1 IMPLANT
NS IRRIG 500ML POUR BTL (IV SOLUTION) ×1 IMPLANT
PACK BASIN MINOR ARMC (MISCELLANEOUS) ×1 IMPLANT
PACK EXTREMITY ARMC (MISCELLANEOUS) ×1 IMPLANT
PAD PREP OB/GYN DISP 24X41 (PERSONAL CARE ITEMS) ×1 IMPLANT
PULSAVAC PLUS IRRIG FAN TIP (DISPOSABLE) IMPLANT
SOL PREP PVP 2OZ (MISCELLANEOUS) ×2 IMPLANT
SOLUTION PREP PVP 2OZ (MISCELLANEOUS) ×1 IMPLANT
SPONGE T-LAP 18X18 ~~LOC~~+RFID (SPONGE) ×1 IMPLANT
SUT ETHILON 4-0 FS2 18XMFL BLK (SUTURE) IMPLANT
SUT VIC AB 3-0 SH 27X BRD (SUTURE) IMPLANT
SUTURE ETHLN 4-0 FS2 18XMF BLK (SUTURE) IMPLANT
SWAB CULTURE AMIES ANAERIB BLU (MISCELLANEOUS) IMPLANT
SYR BULB IRRIG 60ML STRL (SYRINGE) ×1 IMPLANT
TIP FAN IRRIG PULSAVAC PLUS (DISPOSABLE) IMPLANT
TRAP FLUID SMOKE EVACUATOR (MISCELLANEOUS) ×1 IMPLANT
WATER STERILE IRR 500ML POUR (IV SOLUTION) ×1 IMPLANT

## 2023-12-31 NOTE — Progress Notes (Signed)
 [facility] Progress Note  Patient: Brendan Williams    JJO:[id number]  DOA: 12/26/2023     Date of Service: the patient was seen and examined on 12/31/2023  Chief Complaint  Patient presents with   Leg Pain   Brief hospital course: Brendan Williams is a 63 y.o. 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   [name] states that 2 days ago, he was standing in his kitchen when he had sudden onset pain in his left lower leg.  Pain has progressively worsened since then and he has noticed that his leg was very cold.  He denies any other symptoms including nausea, vomiting, diarrhea, chest pain, shortness of breath.  He notes that he has been having wound issues with his right stump and it has been draining but he has not noticed any odor or redness.   ED Course:  On arrival to the ED, patient was normotensive at 117/84 with HR of 69. He was saturating at 100% on room air. He was afebrile at 98.4. Initial work up notable for hemoglobin of 12.2 and unremarkable CMP. INR and PTT within normal limits. Patient started on heparin infusion and Vascular surgery consulted. [facility] contacted for admission.    Assessment and Plan:  # Critical limb ischemia of left lower extremity As per patient pain started 2 days ago and it is getting worse. Currently patient is on Eliquis which has been held Continue statin, continue pain meds prn Continue heparin IV infusion, pharmacy consulted for dosing and PTT monitoring as per protocol. Vascular surgery consulted, On 3/13 s/p Agio: Mechanical thrombectomy of the left common and external iliac arteries, left common femoral and profunda femoris arteries with stent placement to the left external iliac artery and stent placement to the aorta and most proximal common iliac artery.  3/17 d/w vasc sx, no plan to repeat Angio   # PAD (peripheral  artery disease)  Extensive PAD requiring multiple angiographies and subsequent right AKA in December 2024.  He has only missed 1 day of Eliquis in the last week but reports otherwise good compliance.  He is no longer smoking. - Management of critical limb ischemia as noted above   # Non-healing wound of amputation stump Unfortunately, patient has had poor wound healing of his left AKA stump.  appears worse than 3 weeks ago with increased drainage.   Previous culture data with Pseudomonas and E. coli.  MRSA PCR negative.  S/p Bactrim and ciprofloxacin given as an outpatient S/p Zyvox and cefepime d/c'd on 3/13 3/13 discussed with pharmacy, recommended to start meropenem, no need of MRSA coverage at this time. MRSA PCR negative  3/17 s/p  Irrigation and excisional debridement of skin, soft tissue, and muscle for approximately 20 cm to the right above-knee amputation site. Nondisposable VAC dressing placement right wound Placement of stimulan antibiotic beads right wound TOC consulted for Wound vac arrangement for home use   # Hypotension 3/14 started midodrine 10 mg p.o. 3 times daily with holding parameters 3/17 decreased midodrine 5 mg po tid w/holding parameters  # Neuropathy Continue home gabapentin  # Anemia, Iron deficiency, transferrin saturation 7% Started oral iron supplement with Vit C  Folate wnl  # Hypophosphatemia, Phos repleted, Monitor electrolytes # Vitamin D deficiency: started vitamin D 50,000 units p.o. weekly, follow with PCP to repeat vitamin D level after 3 to 6 months. #  Vitamin B12 deficiency: B12 level 207 and goal is >400, Started vitamin B12 1000 mcg IM injection daily during hospital stay, followed by oral supplement.  Follow-up PCP to repeat vitamin B12 level after 3 to 6 months.   Body mass index is 22.43 kg/m.  Interventions:  Diet: Heart Healthy diet DVT Prophylaxis: Hep gtt   Advance goals of care discussion: Full code  Family Communication:  family was not present at bedside, at the time of interview.  The pt provided permission to discuss medical plan with the family. Opportunity was given to ask question and all questions were answered satisfactorily.   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 Discharge to home w/ wound vac, most likely tomorrow am   Subjective: No significant events overnight, c/o Pain in the LLE, Denies any other complaints  Pt was resting comfortably   Physical Exam: General: NAD, lying comfortably Appear in no distress, affect appropriate Eyes: PERRLA ENT: Oral Mucosa Clear, moist  Neck: no JVD,  Cardiovascular: S1 and S2 Present, no Murmur,  Respiratory: good respiratory effort, Bilateral Air entry equal and Decreased, no Crackles, no wheezes Abdomen: Bowel Sound present, Soft and no tenderness,  Skin: no rashes Extremities: s/p Right AKA stump debridement, dressing CDI, LLE tenderness to light touch, no signs of infection  Neurologic: without any new focal findings Gait not checked due to patient safety concerns  Vitals:   12/31/23 1105 12/31/23 1110 12/31/23 1115 12/31/23 1120  BP:   132/76   Pulse: (!) 57 (!) 55 (!) 58 62  Resp: 13 (!) 9 14 18   Temp:   98.6 F (37 C)   TempSrc:      SpO2: 100% 100% 99% 100%  Weight:      Height:        Intake/Output Summary (Last 24 hours) at 12/31/2023 1530 Last data filed at 12/31/2023 1115 Gross per 24 hour  Intake 971.08 ml  Output 25 ml  Net 946.08 ml   Filed Weights   12/26/23 1144  Weight: 77.1 kg    Data Reviewed: I have personally reviewed and interpreted daily labs, tele strips, imagings as discussed above. I reviewed all nursing notes, pharmacy notes, vitals, pertinent old records I have discussed plan of care as described above with RN and  patient/family.  CBC: Recent Labs  Lab 12/27/23 0912 12/28/23 0108 12/29/23 0227 12/30/23 0451 12/31/23 0220  WBC 7.9 10.4 10.2 9.4 9.1  HGB 10.5* 8.5* 8.2* 8.0* 8.2*  HCT 31.8* 26.5* 25.2* 24.5* 25.3*  MCV 90.1 89.8 89.7 88.1 88.5  PLT 272 232 253 261 281   Basic Metabolic Panel: Recent Labs  Lab 12/27/23 1640 12/28/23 0108 12/29/23 0227 12/30/23 0451 12/31/23 0220  NA 139 136 136 138 138  K 3.7 3.7 3.8 3.6 3.5  CL 107 104 105 105 106  CO2 21* 21* 23 25 25   GLUCOSE 181* 233* 106* 121* 137*  BUN 10 13 11  6* 6*  CREATININE 0.77 1.03 0.77 0.68 0.60*  CALCIUM 8.7* 8.3* 8.3* 8.6* 9.0  MG  --  1.7 2.0 1.8 2.0  PHOS  --  2.8 2.4* 3.5 3.6    Studies: No results found.   Scheduled Meds:  ascorbic acid  500 mg Oral Daily   aspirin EC  81 mg Oral Daily   atorvastatin  40 mg  Oral Daily   Chlorhexidine Gluconate Cloth  6 each Topical Q0600   cyanocobalamin  1,000 mcg Intramuscular Daily   Followed by   Melene Muller ON 01/05/2024] vitamin B-12  1,000 mcg Oral Daily   gabapentin  300 mg Oral BID   iron polysaccharides  150 mg Oral Daily   midodrine  10 mg Oral TID WC   sodium chloride flush  3 mL Intravenous Q12H   Vitamin D (Ergocalciferol)  50,000 Units Oral Q7 days   Continuous Infusions:  heparin 1,900 Units/hr (12/31/23 0328)   meropenem (MERREM) IV 1 g (12/31/23 1425)   PRN Meds: acetaminophen **OR** acetaminophen, diphenhydrAMINE, HYDROmorphone (DILAUDID) injection, ondansetron **OR** ondansetron (ZOFRAN) IV, oxyCODONE, polyethylene glycol  Time spent: 35 minutes  Author: Gillis Santa. MD [facility] 12/31/2023 3:30 PM  To reach On-call, see care teams to locate the attending and reach out to them via [email or web address]. If 7PM-7AM, please contact night-coverage If you still have difficulty reaching the attending provider, please page the [facility] (Director on Call) for [facility] on amion for assistance.

## 2023-12-31 NOTE — TOC Progression Note (Addendum)
 Transition of Care [facility]) - Progression Note    Patient Details  Name: Brendan Williams MRN: [id number] Date of Birth: 01/08/1961  Transition of Care [facility]) CM/SW Contact  Marlowe Sax, RN Phone Number: 12/31/2023, 2:34 PM  Clinical Narrative:     Kizzie Ide with 3 M and requested a wound vac, [facility] unable to accept for [facility], I reached out to [place], [place], [place], [place], [place], [place], [facility] asking if they can take this medicaid for a Wound vac care, waiting on a response     Update, [facility] is not able to accept for [facility], [facility] is not able to accept for [facility], [facility] is not able to accept for [facility], [facility] is not able to accept for [facility], [facility] is not in network with [facility] Medicaid, [facility] is not able to accept for [facility]  Expected Discharge Plan and Services                                               Social Determinants of Health (SDOH) Interventions SDOH Screenings   Food Insecurity: Patient Declined (12/27/2023)  Housing: Patient Declined (12/27/2023)  Transportation Needs: Patient Declined (12/27/2023)  Utilities: Patient Declined (12/27/2023)  Tobacco Use: Medium Risk (12/26/2023)    Readmission Risk Interventions     No data to display

## 2023-12-31 NOTE — Interval H&P Note (Signed)
 History and Physical Interval Note:  12/31/2023 8:07 AM  Brendan Williams  has presented today for surgery, with the diagnosis of AKA stump infection.  The various methods of treatment have been discussed with the patient and family. After consideration of risks, benefits and other options for treatment, the patient has consented to  Procedure(s) with comments: IRRIGATION AND DEBRIDEMENT WOUND (Right) - RIGHT AKA STUMP WOUND APPLICATION, WOUND VAC (Right) as a surgical intervention.  The patient's history has been reviewed, patient examined, no change in status, stable for surgery.  I have reviewed the patient's chart and labs.  Questions were answered to the patient's satisfaction.     Festus Barren

## 2023-12-31 NOTE — Transfer of Care (Signed)
 Immediate Anesthesia Transfer of Care Note  Patient: Brendan Williams  Procedure(s) Performed: IRRIGATION AND DEBRIDEMENT WOUND (Right) APPLICATION, WOUND VAC (Right)  Patient Location: PACU  Anesthesia Type:General  Level of Consciousness: awake, alert , oriented, and drowsy  Airway & Oxygen Therapy: Patient Spontanous Breathing  Post-op Assessment: Report given to RN and Post -op Vital signs reviewed and stable  Post vital signs: Reviewed and stable  Last Vitals:  Vitals Value Taken Time  BP    Temp 36.5 C 12/31/23 1045  Pulse 60 12/31/23 1048  Resp 14 12/31/23 1048  SpO2 99 % 12/31/23 1048  Vitals shown include unfiled device data.  Last Pain:  Vitals:   12/31/23 0812  TempSrc: Temporal  PainSc: 0-No pain         Complications: No notable events documented.

## 2023-12-31 NOTE — Anesthesia Preprocedure Evaluation (Signed)
 Anesthesia Evaluation  Patient identified by MRN, date of birth, ID band Patient awake    Reviewed: Allergy & Precautions, H&P , NPO status , Patient's Chart, lab work & pertinent test results, reviewed documented beta blocker date and time   Airway Mallampati: II  TM Distance: >3 FB Neck ROM: full    Dental no notable dental hx. (+) Teeth Intact   Pulmonary asthma , former smoker   Pulmonary exam normal breath sounds clear to auscultation       Cardiovascular Exercise Tolerance: Good hypertension, On Medications + Peripheral Vascular Disease   Rhythm:regular Rate:Normal     Neuro/Psych   Anxiety     negative neurological ROS  negative psych ROS   GI/Hepatic negative GI ROS, Neg liver ROS,,,  Endo/Other  negative endocrine ROSdiabetes, Poorly Controlled    Renal/GU      Musculoskeletal   Abdominal   Peds  Hematology negative hematology ROS (+)   Anesthesia Other Findings   Reproductive/Obstetrics negative OB ROS                             Anesthesia Physical Anesthesia Plan  ASA: 3  Anesthesia Plan: MAC   Post-op Pain Management:    Induction:   PONV Risk Score and Plan:   Airway Management Planned:   Additional Equipment:   Intra-op Plan:   Post-operative Plan:   Informed Consent: I have reviewed the patients History and Physical, chart, labs and discussed the procedure including the risks, benefits and alternatives for the proposed anesthesia with the patient or authorized representative who has indicated his/her understanding and acceptance.       Plan Discussed with: CRNA  Anesthesia Plan Comments:        Anesthesia Quick Evaluation

## 2023-12-31 NOTE — Op Note (Signed)
    OPERATIVE NOTE   PROCEDURE: Irrigation and excisional debridement of skin, soft tissue, and muscle for approximately 20 cm to the right above-knee amputation site Nondisposable VAC dressing placement right wound Placement of stimulan antibiotic beads right wound  PRE-OPERATIVE DIAGNOSIS: Nonviable tissue of right above-knee amputation wound  POST-OPERATIVE DIAGNOSIS: Same as above  SURGEON: Festus Barren, MD  ASSISTANT(S): Rolla Plate, NP  ANESTHESIA: MAC  ESTIMATED BLOOD LOSS: 5 cc  FINDING(S): None  SPECIMEN(S): None  INDICATIONS:   Brendan Williams is a 63 y.o. male who presents with opening and unhealthy appearing tissue in his right above-knee amputation site.  There is clearly some nonviable adipose tissue and some eschar down on top of the muscle fascia closure.  He is brought to the operating room to debride the dead tissue and to place a VAC dressing on the wound to try to promote wound healing.  Risks and benefits are discussed and informed consent is obtained. An assistant was present during the procedure to help facilitate the exposure and expedite the procedure.   DESCRIPTION: After obtaining full informed written consent, the patient was brought back to the operating room and placed supine upon the operating table.  The patient received IV antibiotics prior to induction.  After obtaining adequate anesthesia, the patient was prepped and draped in the standard fashion. The assistant provided retraction and mobilization to help facilitate exposure and expedite the procedure throughout the entire procedure.  This included following suture, using retractors, and optimizing lighting.  The wound was then opened and excisional debridement was performed to skin edge, soft tissue for the dead adipose, and down to the muscle fascia to remove all clearly non-viable tissue.  The tissue was taken back to bleeding tissue that appeared viable.  The debridement was performed with  Metzenbaum scissors and encompassed an area of approximately 20 cm2.  The wound measured about 6 to 7 cm in length, about 2-1/2 to 3 cm in width, and had about 1-1/2 cm in depth.  The wound was irrigated copiously with sterile saline.  After all clearly non-viable tissue was removed, vancomycin and gentamicin impregnated antibiotic beads were placed in the wound.  A small VAC sponge was then cut to fit the wound.  Strips of [place] were placed over the sponge and a good occlusive seal was obtained when connected to suction tubing. The patient was then awakened from anesthesia and taken to the recovery room in stable condition having tolerated the procedure well.  COMPLICATIONS: none  CONDITION: stable  Festus Barren  12/31/2023, 10:48 AM   This note was created with Dragon Medical transcription system. Any errors in dictation are purely unintentional.

## 2023-12-31 NOTE — Plan of Care (Signed)
  Problem: Education: Goal: Knowledge of General Education information will improve Description: Including pain rating scale, medication(s)/side effects and non-pharmacologic comfort measures Outcome: Progressing   Problem: Health Behavior/Discharge Planning: Goal: Ability to manage health-related needs will improve Outcome: Progressing   Problem: Clinical Measurements: Goal: Will remain free from infection Outcome: Progressing Goal: Cardiovascular complication will be avoided Outcome: Progressing   Problem: Activity: Goal: Risk for activity intolerance will decrease Outcome: Progressing   Problem: Nutrition: Goal: Adequate nutrition will be maintained Outcome: Progressing   Problem: Coping: Goal: Level of anxiety will decrease Outcome: Progressing   

## 2023-12-31 NOTE — Progress Notes (Signed)
 PT Cancellation Note  Patient Details Name: Brendan Williams MRN: [id number] DOB: [birth year]   Cancelled Treatment:    Reason Eval/Treat Not Completed: Patient at procedure or test/unavailable, will attempt to see pt at a future date/time as medically appropriate.     Ovidio Hanger PT, DPT 12/31/23, 9:14 AM

## 2023-12-31 NOTE — Consult Note (Addendum)
 PHARMACY - ANTICOAGULATION CONSULT NOTE  Pharmacy Consult for heparin infusion Indication:  ischemic limb  No Known Allergies  Patient Measurements: Height: 6\' 1"  (185.4 cm) Weight: 77.1 kg (170 lb) IBW/kg (Calculated) : 79.9 Heparin Dosing Weight: 77.1 kg  Vital Signs: Temp: 98.3 F (36.8 C) (03/17 0452) BP: 101/76 (03/17 0452) Pulse Rate: 74 (03/17 0452)  Labs: Recent Labs    12/29/23 0227 12/29/23 1052 12/30/23 0451 12/30/23 0800 12/30/23 1334 12/31/23 0220  HGB 8.2*  --  8.0*  --   --  8.2*  HCT 25.2*  --  24.5*  --   --  25.3*  PLT 253  --  261  --   --  281  HEPARINUNFRC 0.24*   < >  --  0.39 0.40 <0.10*  CREATININE 0.77  --  0.68  --   --  0.60*   < > = values in this interval not displayed.   Estimated Creatinine Clearance: 103.1 mL/min (A) (by C-G formula based on SCr of 0.6 mg/dL (L)).  Medical History: Past Medical History:  Diagnosis Date   Anxiety    situational   Asthma    as a child   Atherosclerosis of native arteries of extremity with intermittent claudication (HCC)    Cellulitis and abscess of toe of right foot    Critical limb ischemia of right lower extremity (HCC)    Current smoker    Quit 04/16/23   Peripheral arterial disease (HCC)    Medications:  Patient prescribed Eliquis, patient reports taking dose on 3/12  Assessment: 63 yo male sent to ED from vascular surgery office due to concern of left lower extremity ischemia. PMH includes PAD, neuropathy, and recent  AKA on right.  Pharmacy consulted for initiation of heparin infusion.  Goal of Therapy:  Heparin level 0.3-0.7 units/mL  Monitor platelets by anticoagulation protocol: Yes  Labs: 0313 1909 HL 0.44  Therapeutic x 1 on previously therapeutic rate of 1250 units/hr  0314 0108 HL 0.41, Therapeutic X 2  0315 0227 HL 0.24, SUBtherapeutic 0315 1052 HL 0.14, SUBtherapeutic  0315 1845 HL 0.41, Therapeutic x 1 0316 0047 HL < 0.1 SUBtherapeutic 0316 0800 HL 0.39, Therapeutic x  1 0316 1334 HL 0.40, Therapeutic x 2 0317 0220 HL < 0.1 SUBtherapeutic 0317 0609 HL 0.30  Therapeutic X 3    Plan:  3/17:  HL @ 0220 = < 0.1, SUBtherapeutic  - RN stated heparin gtt was turned off for ~ 30 min just prior to sample being drawn so this could be why HL came back low following 2 consecutive therapeutic readings - Will order STAT repeat HL for 0520  3/17:  HL @ 0609 = 0.30, therapeutic X 3 - repeat HL is therapeutic so result @ 0220 was probably due to error - will continue pt on current rate and recheck HL on 3/18 with AM labs.  - Continue to check daily CBC while on heparin  Holt Antolin D, PharmD Clinical Pharmacist  12/31/2023 5:19 AM

## 2023-12-31 NOTE — Anesthesia Procedure Notes (Signed)
 Date/Time: 12/31/2023 9:42 AM  Performed by: Ginger Carne, CRNAPre-anesthesia Checklist: Patient identified, Timeout performed, Emergency Drugs available, Suction available and Patient being monitored Patient Re-evaluated:Patient Re-evaluated prior to induction Oxygen Delivery Method: Simple face mask Preoxygenation: Pre-oxygenation with 100% oxygen Induction Type: IV induction

## 2023-12-31 NOTE — Plan of Care (Signed)
  Problem: Education: Goal: Knowledge of General Education information will improve Description: Including pain rating scale, medication(s)/side effects and non-pharmacologic comfort measures Outcome: Progressing   Problem: Activity: Goal: Risk for activity intolerance will decrease Outcome: Progressing   Problem: Safety: Goal: Ability to remain free from injury will improve Outcome: Progressing   Problem: Pain Managment: Goal: General experience of comfort will improve and/or be controlled Outcome: Progressing

## 2024-01-01 ENCOUNTER — Encounter: Payer: Self-pay | Admitting: Vascular Surgery

## 2024-01-01 ENCOUNTER — Other Ambulatory Visit: Payer: Self-pay

## 2024-01-01 DIAGNOSIS — I70222 Atherosclerosis of native arteries of extremities with rest pain, left leg: Secondary | ICD-10-CM | POA: Diagnosis not present

## 2024-01-01 LAB — CBC
HCT: 23.9 % — ABNORMAL LOW (ref 39.0–52.0)
Hemoglobin: 7.8 g/dL — ABNORMAL LOW (ref 13.0–17.0)
MCH: 29.1 pg (ref 26.0–34.0)
MCHC: 32.6 g/dL (ref 30.0–36.0)
MCV: 89.2 fL (ref 80.0–100.0)
Platelets: 325 10*3/uL (ref 150–400)
RBC: 2.68 MIL/uL — ABNORMAL LOW (ref 4.22–5.81)
RDW: 14.9 % (ref 11.5–15.5)
WBC: 9.3 10*3/uL (ref 4.0–10.5)
nRBC: 0 % (ref 0.0–0.2)

## 2024-01-01 LAB — BASIC METABOLIC PANEL
Anion gap: 7 (ref 5–15)
BUN: 7 mg/dL — ABNORMAL LOW (ref 8–23)
CO2: 26 mmol/L (ref 22–32)
Calcium: 9.1 mg/dL (ref 8.9–10.3)
Chloride: 104 mmol/L (ref 98–111)
Creatinine, Ser: 0.68 mg/dL (ref 0.61–1.24)
GFR, Estimated: >60 mL/min (ref >60)
Glucose, Bld: 119 mg/dL — ABNORMAL HIGH (ref 70–99)
Potassium: 3.7 mmol/L (ref 3.5–5.1)
Sodium: 137 mmol/L (ref 135–145)

## 2024-01-01 LAB — MAGNESIUM: Magnesium: 2 mg/dL (ref 1.7–2.4)

## 2024-01-01 LAB — HEPARIN LEVEL (UNFRACTIONATED): Heparin Unfractionated: 0.33 [IU]/mL (ref 0.30–0.70)

## 2024-01-01 LAB — PHOSPHORUS: Phosphorus: 3.1 mg/dL (ref 2.5–4.6)

## 2024-01-01 MED ORDER — ASCORBIC ACID 500 MG PO TABS
500.0000 mg | ORAL_TABLET | Freq: Every day | ORAL | 2 refills | Status: AC
  Filled 2024-01-01: qty 30, 30d supply, fill #0

## 2024-01-01 MED ORDER — MIDODRINE HCL 5 MG PO TABS
5.0000 mg | ORAL_TABLET | Freq: Three times a day (TID) | ORAL | 0 refills | Status: AC | PRN
  Filled 2024-01-01: qty 90, 30d supply, fill #0

## 2024-01-01 MED ORDER — VITAMIN D (ERGOCALCIFEROL) 1.25 MG (50000 UNIT) PO CAPS
50000.0000 [IU] | ORAL_CAPSULE | ORAL | 0 refills | Status: AC
  Filled 2024-01-01: qty 12, 84d supply, fill #0

## 2024-01-01 MED ORDER — POLYSACCHARIDE IRON COMPLEX 150 MG PO CAPS
150.0000 mg | ORAL_CAPSULE | Freq: Every day | ORAL | 2 refills | Status: AC
  Filled 2024-01-01: qty 30, 30d supply, fill #0

## 2024-01-01 MED ORDER — APIXABAN 5 MG PO TABS
5.0000 mg | ORAL_TABLET | Freq: Two times a day (BID) | ORAL | Status: DC
  Administered 2024-01-01: 5 mg via ORAL
  Filled 2024-01-01: qty 1

## 2024-01-01 MED ORDER — CYANOCOBALAMIN 1000 MCG PO TABS
1000.0000 ug | ORAL_TABLET | Freq: Every day | ORAL | 2 refills | Status: AC
  Filled 2024-01-01: qty 20, 20d supply, fill #0

## 2024-01-01 NOTE — Progress Notes (Signed)
 OT Cancellation Note  Patient Details Name: Brendan Williams MRN: [id number] DOB: 27-Aug-1961   Cancelled Treatment:    Reason Eval/Treat Not Completed: Other (comment). Chart reviewed, OT attempt prior to discharge. Pt readying for discharge this afternoon, declines further OT needs. OT will sign off.  Ambrose Wile L. Wave Calzada, OTR/L  01/01/24, 3:55 PM

## 2024-01-01 NOTE — TOC Progression Note (Signed)
 Transition of Care [facility]) - Progression Note    Patient Details  Name: Brendan Williams MRN: [id number] Date of Birth: 03-01-1961  [facility]) CM/SW Contact  Marlowe Sax, RN Phone Number: 01/01/2024, 11:22 AM  Clinical Narrative:    Unable to locate a [facility] agency to accept the patient, 3 M will deliver the wound vac between 1-2 PM, I notified NP Rolla Plate and let them know that  was not able to locate a [facility] agency, The patient will go to the providers office for wound vac care        Expected Discharge Plan and Services                                               Social Determinants of Health (SDOH) Interventions SDOH Screenings   Food Insecurity: Patient Declined (12/27/2023)  Housing: Patient Declined (12/27/2023)  Transportation Needs: Patient Declined (12/27/2023)  Utilities: Patient Declined (12/27/2023)  Tobacco Use: Medium Risk (12/26/2023)    Readmission Risk Interventions     No data to display

## 2024-01-01 NOTE — Discharge Summary (Signed)
 [facility] Discharge Summary   Patient: TASHAUN OBEY UXL:[id number]  PCP: Gildardo Pounds, PA  Date of admission: 12/26/2023   Date of discharge:  01/01/2024     Discharge Diagnoses:  Principal Problem:   Critical limb ischemia of left lower extremity (HCC) Active Problems:   PAD (peripheral artery disease) (HCC)   Non-healing wound of amputation stump (HCC)   Neuropathy   Admitted From: Home Disposition:  Home with [facility]  Recommendations for Outpatient Follow-up:  Follow-up with PCP in 1 week, monitor BP at home and take midodrine if systolic BP less than 100 mmHg.  PCP need to titrate medications accordingly. Follow-up with vascular surgery in 1 week, continue ciprofloxacin which was prescribed by vascular surgery and follow as an outpatient for duration of antibiotics. Continue wound VAC as per vascular surgery. Follow up LABS/TEST: As above   Follow-up Information     Whitten, Robin A, PA Follow up in 1 week(s).   Specialty: Physician Assistant Contact information: [address] [place] [address] [phone]         Annice Needy, MD Follow up in 1 week(s).   Specialties: Vascular Surgery, Radiology, Interventional Cardiology Contact information: [address] [place] [address] [phone]                Diet recommendation: Cardiac diet  Activity: The patient is advised to gradually reintroduce usual activities, as tolerated  Discharge Condition: stable  Code Status: Full code   History of present illness: As per the H and P dictated on admission  Hospital Course:  Brendan Williams is a 63 y.o. 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   [name] states that 2 days ago, he was standing in his kitchen when he had sudden onset pain in his left lower leg.  Pain has progressively worsened since then  and he has noticed that his leg was very cold.  He denies any other symptoms including nausea, vomiting, diarrhea, chest pain, shortness of breath.  He notes that he has been having wound issues with his right stump and it has been draining but he has not noticed any odor or redness.   ED Course:  On arrival to the ED, patient was normotensive at 117/84 with HR of 69. He was saturating at 100% on room air. He was afebrile at 98.4. Initial work up notable for hemoglobin of 12.2 and unremarkable CMP. INR and PTT within normal limits. Patient started on heparin infusion and Vascular surgery consulted. [facility] contacted for admission.      Assessment and Plan:   # Critical limb ischemia of left lower extremity As per patient pain started 2 days ago and it is getting worse. Eliquis was held during hospital stay. Continue statin, continue pain meds prn S/p heparin IV infusion, pharmacy consulted for dosing and PTT monitoring as per protocol. Vascular surgery consulted, On 3/13 s/p Agio: Mechanical thrombectomy of the left common and external iliac arteries, left common femoral and profunda femoris arteries with stent placement to the left external iliac artery and stent placement to the aorta and most proximal common iliac artery. 3/17 d/w vasc sx, no plan to repeat Angio 3/18 resumed Eliquis before discharge and discontinued heparin IV infusion.  Patient was cleared by vascular surgery to discharge and follow-up as an outpatient.  Wound VAC was not arranged  by TOC.  # PAD (peripheral artery disease)  Extensive PAD requiring multiple angiographies and subsequent right AKA in December 2024.  He has only missed 1 day of Eliquis in the last week but reports otherwise good compliance.  He is no longer smoking. Management of critical limb ischemia as noted above   # Non-healing wound of amputation stump Unfortunately, patient has had poor wound healing of his left AKA stump.  appears worse than 3 weeks ago with  increased drainage.   Previous culture data with Pseudomonas and E. coli.  MRSA PCR negative.  S/p Bactrim and ciprofloxacin given as an outpatient.  S/p Zyvox and cefepime d/c'd on 3/13 3/13 discussed with pharmacy, recommended to start meropenem, no need of MRSA coverage at this time. MRSA PCR negative  3/17 s/p  Irrigation and excisional debridement of skin, soft tissue, and muscle for approximately 20 cm to the right above-knee amputation site. ondisposable VAC dressing placement right wound. Placement of stimulan antibiotic beads right wound.  TOC consulted for Wound vac arrangement for home use.    # Hypotension: On 3/14 started midodrine 10 mg p.o. 3 times daily with holding parameters. On 3/17 decreased midodrine 5 mg po tid w/holding parameters.  Monitor PCP as an outpatient to titrate medications accordingly. # Neuropathy: Continue home gabapentin # Anemia, Iron deficiency, transferrin saturation 7%, Started oral iron supplement with Vit C  # Hypophosphatemia, Phos repleted, resolved # Vitamin D deficiency: started vitamin D 50,000 units p.o. weekly, follow with PCP to repeat vitamin D level after 3 to 6 months. # Vitamin B12 deficiency: B12 level 207 and goal is >400, Started vitamin B12 1000 mcg IM injection daily during hospital stay, followed by oral supplement.  Follow-up PCP to repeat vitamin B12 level after 3 to 6 months.    Body mass index is 22.43 kg/m.  Nutrition Interventions:  - Patient was instructed, not to drive, operate heavy machinery, perform activities at heights, swimming or participation in water activities or provide baby sitting services while on Pain, Sleep and Anxiety Medications; until his outpatient Physician has advised to do so again.  - Also recommended to not to take more than prescribed Pain, Sleep and Anxiety Medications.  Patient was seen by physical therapy, who recommended Home health, which was arranged. On the day of the discharge the patient's  vitals were stable, and no other acute medical condition were reported by patient. the patient was felt safe to be discharge at Home with Home health.  Consultants: Vascular surgery Procedures: s/p  Irrigation and excisional debridement of skin, soft tissue, and muscle for approximately 20 cm to the right above-knee amputation site.   Discharge Exam: General: Appear in no distress, no Rash; Oral Mucosa Clear, moist. Cardiovascular: S1 and S2 Present, no Murmur, Respiratory: normal respiratory effort, Bilateral Air entry present and no Crackles, no wheezes Abdomen: Bowel Sound present, Soft and no tenderness, no hernia Extremities: no Pedal edema, no calf tenderness Neurology: alert and oriented to time, place, and person affect appropriate, Paschal Dopp   [facility] Weights   12/26/23 1144  Weight: 77.1 kg   Vitals:   01/01/24 0545 01/01/24 0716  BP: (!) 89/61 110/70  Pulse: 67 68  Resp: 20 18  Temp: 98.1 F (36.7 C) 99.4 F (37.4 C)  SpO2: 99% 98%    DISCHARGE MEDICATION: Allergies as of 01/01/2024   No Known Allergies      Medication List     STOP taking these medications    sulfamethoxazole-trimethoprim  800-160 MG tablet Commonly known as: BACTRIM DS       TAKE these medications    apixaban 5 MG Tabs tablet Commonly known as: ELIQUIS Take 1 tablet (5 mg total) by mouth 2 (two) times daily.   ascorbic acid 500 MG tablet Commonly known as: VITAMIN C Take 1 tablet (500 mg total) by mouth daily. Start taking on: January 02, 2024   aspirin EC 81 MG tablet Take 1 tablet (81 mg total) by mouth daily.   atorvastatin 40 MG tablet Commonly known as: Lipitor Take 1 tablet (40 mg total) by mouth daily.   busPIRone 5 MG tablet Commonly known as: BUSPAR Take 1 tablet (5 mg total) by mouth 3 (three) times daily as needed (anxiety).   ciprofloxacin 250 MG tablet Commonly known as: Cipro Take 3 tablets (750 mg total) by mouth 2 (two) times daily.   cyanocobalamin  1000 MCG tablet Take 1 tablet (1,000 mcg total) by mouth daily. Start taking on: January 05, 2024   dexlansoprazole 60 MG capsule Commonly known as: Dexilant Take 1 capsule (60 mg total) by mouth as needed.   gabapentin 300 MG capsule Commonly known as: NEURONTIN Take 1 capsule (300 mg total) by mouth 2 (two) times daily.   iron polysaccharides 150 MG capsule Commonly known as: NIFEREX Take 1 capsule (150 mg total) by mouth daily. Start taking on: January 02, 2024   midodrine 5 MG tablet Commonly known as: PROAMATINE Take 1 tablet (5 mg total) by mouth 3 (three) times daily as needed (If systolic BP less than 100 mmHg).   ondansetron 4 MG tablet Commonly known as: Zofran Take 1 tablet (4 mg total) by mouth every 8 (eight) hours as needed for nausea or vomiting.   oxyCODONE-acetaminophen 7.5-325 MG tablet Commonly known as: Percocet Take 1 tablet by mouth every 4 (four) hours as needed for severe pain (pain score 7-10).   Vitamin D (Ergocalciferol) 1.25 MG (50000 UNIT) Caps capsule Commonly known as: DRISDOL Take 1 capsule (50,000 Units total) by mouth every 7 (seven) days. Start taking on: January 05, 2024               Discharge Care Instructions  (From admission, onward)           Start     Ordered   01/01/24 0000  Discharge wound care:       Comments: As per vascular surgery   01/01/24 1431           No Known Allergies Discharge Instructions     Call MD for:  difficulty breathing, headache or visual disturbances   Complete by: As directed    Call MD for:  extreme fatigue   Complete by: As directed    Call MD for:  persistant dizziness or light-headedness   Complete by: As directed    Call MD for:  persistant nausea and vomiting   Complete by: As directed    Call MD for:  redness, tenderness, or signs of infection (pain, swelling, redness, odor or green/yellow discharge around incision site)   Complete by: As directed    Call MD for:  severe  uncontrolled pain   Complete by: As directed    Call MD for:  temperature >100.4   Complete by: As directed    Diet - low sodium heart healthy   Complete by: As directed    Discharge instructions   Complete by: As directed    Follow-up with PCP in 1 week, monitor  BP at home and take midodrine if systolic BP less than 100 mmHg.  PCP need to titrate medications accordingly. Follow-up with vascular surgery in 1 week, continue ciprofloxacin which was prescribed by vascular surgery and follow as an outpatient for duration of antibiotics. Continue wound VAC as per vascular surgery.   Discharge wound care:   Complete by: As directed    As per vascular surgery   Increase activity slowly   Complete by: As directed        The results of significant diagnostics from this hospitalization (including imaging, microbiology, ancillary and laboratory) are listed below for reference.    Significant Diagnostic Studies: PERIPHERAL VASCULAR CATHETERIZATION Result Date: 12/27/2023 See surgical note for result.   Microbiology: Recent Results (from the past 240 hours)  MRSA Next Gen by PCR, Nasal     Status: None   Collection Time: 12/26/23  2:48 PM   Specimen: Nasal Mucosa; Nasal Swab  Result Value Ref Range Status   MRSA by PCR Next Gen NOT DETECTED NOT DETECTED Final    Comment: (NOTE) The GeneXpert MRSA Assay (FDA approved for NASAL specimens only), is one component of a comprehensive MRSA colonization surveillance program. It is not intended to diagnose MRSA infection nor to guide or monitor treatment for MRSA infections. Test performance is not FDA approved in patients less than 87 years old. Performed at [facility], [address] [place], [place] [address]      Labs: CBC: Recent Labs  Lab 12/28/23 0108 12/29/23 0227 12/30/23 0451 12/31/23 0220 01/01/24 0359  WBC 10.4 10.2 9.4 9.1 9.3  HGB 8.5* 8.2* 8.0* 8.2* 7.8*  HCT 26.5* 25.2* 24.5* 25.3* 23.9*  MCV 89.8 89.7  88.1 88.5 89.2  PLT 232 253 261 281 325   Basic Metabolic Panel: Recent Labs  Lab 12/28/23 0108 12/29/23 0227 12/30/23 0451 12/31/23 0220 01/01/24 0359  NA 136 136 138 138 137  K 3.7 3.8 3.6 3.5 3.7  CL 104 105 105 106 104  CO2 21* 23 25 25 26   GLUCOSE 233* 106* 121* 137* 119*  BUN 13 11 6* 6* 7*  CREATININE 1.03 0.77 0.68 0.60* 0.68  CALCIUM 8.3* 8.3* 8.6* 9.0 9.1  MG 1.7 2.0 1.8 2.0 2.0  PHOS 2.8 2.4* 3.5 3.6 3.1   Liver Function Tests: Recent Labs  Lab 12/26/23 1231  AST 29  ALT 13  ALKPHOS 73  BILITOT 1.9*  PROT 7.3  ALBUMIN 3.5   No results for input(s): "LIPASE", "AMYLASE" in the last 168 hours. No results for input(s): "AMMONIA" in the last 168 hours. Cardiac Enzymes: No results for input(s): "CKTOTAL", "CKMB", "CKMBINDEX", "TROPONINI" in the last 168 hours. BNP (last 3 results) No results for input(s): "BNP" in the last 8760 hours. CBG: No results for input(s): "GLUCAP" in the last 168 hours.  Time spent: 35 minutes  Signed:  Gillis Santa  [facility] 01/01/2024 2:32 PM

## 2024-01-01 NOTE — Anesthesia Postprocedure Evaluation (Signed)
 Anesthesia Post Note  Patient: HILARY PUNDT  Procedure(s) Performed: IRRIGATION AND DEBRIDEMENT WOUND (Right) APPLICATION, WOUND VAC (Right)  Patient location during evaluation: PACU Anesthesia Type: MAC Level of consciousness: awake and alert Pain management: pain level controlled Vital Signs Assessment: post-procedure vital signs reviewed and stable Respiratory status: spontaneous breathing, nonlabored ventilation, respiratory function stable and patient connected to nasal cannula oxygen Cardiovascular status: blood pressure returned to baseline and stable Postop Assessment: no apparent nausea or vomiting Anesthetic complications: no   No notable events documented.   Last Vitals:  Vitals:   01/01/24 0545 01/01/24 0716  BP: (!) 89/61 110/70  Pulse: 67 68  Resp: 20 18  Temp: 36.7 C 37.4 C  SpO2: 99% 98%    Last Pain:  Vitals:   01/01/24 0716  TempSrc: Oral  PainSc: 9                  Yevette Edwards

## 2024-01-01 NOTE — Consult Note (Addendum)
 PHARMACY - ANTICOAGULATION CONSULT NOTE  Pharmacy Consult for heparin infusion Indication:  ischemic limb  No Known Allergies  Patient Measurements: Height: 6\' 1"  (185.4 cm) Weight: 77.1 kg (170 lb) IBW/kg (Calculated) : 79.9 Heparin Dosing Weight: 77.1 kg  Vital Signs: Temp: 98.1 F (36.7 C) (03/18 0545) Temp Source: Oral (03/18 0545) BP: 89/61 (03/18 0545) Pulse Rate: 67 (03/18 0545)  Labs: Recent Labs    12/30/23 0451 12/30/23 0800 12/31/23 0220 12/31/23 0609 01/01/24 0359  HGB 8.0*  --  8.2*  --  7.8*  HCT 24.5*  --  25.3*  --  23.9*  PLT 261  --  281  --  325  HEPARINUNFRC  --    < > <0.10* 0.30 0.33  CREATININE 0.68  --  0.60*  --  0.68   < > = values in this interval not displayed.   Estimated Creatinine Clearance: 103.1 mL/min (by C-G formula based on SCr of 0.68 mg/dL).  Medical History: Past Medical History:  Diagnosis Date   Anxiety    situational   Asthma    as a child   Atherosclerosis of native arteries of extremity with intermittent claudication (HCC)    Cellulitis and abscess of toe of right foot    Critical limb ischemia of right lower extremity (HCC)    Current smoker    Quit 04/16/23   Peripheral arterial disease (HCC)    Medications:  Patient prescribed Eliquis, patient reports taking dose on 3/12  Assessment: 63 yo male sent to ED from vascular surgery office due to concern of left lower extremity ischemia. PMH includes PAD, neuropathy, and recent  AKA on right.  Pharmacy consulted for initiation of heparin infusion.  Goal of Therapy:  Heparin level 0.3-0.7 units/mL  Monitor platelets by anticoagulation protocol: Yes  Labs: 0313 1909 HL 0.44  Therapeutic x 1 on previously therapeutic rate of 1250 units/hr  0314 0108 HL 0.41, Therapeutic X 2  0315 0227 HL 0.24, SUBtherapeutic 0315 1052 HL 0.14, SUBtherapeutic  0315 1845 HL 0.41, Therapeutic x 1 0316 0047 HL < 0.1 SUBtherapeutic 0316 0800 HL 0.39, Therapeutic x 1 0316 1334 HL 0.40,  Therapeutic x 2 0317 0220 HL < 0.1 SUBtherapeutic 0317 0609 HL 0.30  Therapeutic X 3 0318 0359 HL 0.33 Therapeutic x 4   Plan:  - HL therapeutic - will continue pt on current rate and recheck HL on 3/19 with AM labs.  - Continue to check daily CBC while on heparin  Otelia Sergeant, PharmD, [facility] 01/01/2024 5:54 AM

## 2024-01-01 NOTE — Progress Notes (Signed)
 Progress Note    01/01/2024 11:06 AM 1 Day Post-Op  Subjective:   Mr. Mack Alvidrez is a 63 year old male now postop day #4 from catheter placement into the left SFA and profunda femoris arteries from the left brachial approach for an aortogram with selective left lower extremity angiogram.  Patient also underwent mechanical thrombectomy of the left common and external iliac arteries, left common femoral and profunda femoris arteries with stent placement to the left external iliac artery and stent placement to the aorta and most proximal common iliac artery.  Mr. Laureano Hetzer is also postop day #1 from irrigation and excisional debridement of the skin, soft tissue and muscle to the right above-the-knee amputation site.  Had a nondisposable wound VAC dressing placed with antibiotic beads.   Patient sitting up resting comfortably in bed this morning.  Patient endorses his left leg feels much better this morning.  Patient states his left lower extremity is much warmer this morning.  He is recovering as expected.  No complaints overnight.  Vitals all remained stable.   Vitals:   01/01/24 0545 01/01/24 0716  BP: (!) 89/61 110/70  Pulse: 67 68  Resp: 20 18  Temp: 98.1 F (36.7 C) 99.4 F (37.4 C)  SpO2: 99% 98%   Physical Exam: Cardiac:  RRR, normal S1 and S2, no murmurs appreciated. Lungs: Huel Cote on auscultation throughout, no rales rhonchi or wheezing. Incisions: Left brachial arm with cutdown incision.  Suture closed with Dermabond.  No hematoma seroma or infection to note. Extremities: Left lower extremity warm to touch with positive Doppler DP pulses.  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  Dressing changed this morning by nursing. Abdomen: Positive bowel sounds throughout, soft, nontender and nondistended. Neurologic: And oriented x 3, answers all questions and follows commands appropriately.  CBC    Component Value Date/Time   WBC 9.3 01/01/2024 0359   RBC 2.68 (L)  01/01/2024 0359   HGB 7.8 (L) 01/01/2024 0359   HCT 23.9 (L) 01/01/2024 0359   PLT 325 01/01/2024 0359   MCV 89.2 01/01/2024 0359   MCH 29.1 01/01/2024 0359   MCHC 32.6 01/01/2024 0359   RDW 14.9 01/01/2024 0359   LYMPHSABS 1.7 10/19/2023 0520   MONOABS 1.2 (H) 10/19/2023 0520   EOSABS 0.0 10/19/2023 0520   BASOSABS 0.0 10/19/2023 0520    BMET    Component Value Date/Time   NA 137 01/01/2024 0359   K 3.7 01/01/2024 0359   CL 104 01/01/2024 0359   CO2 26 01/01/2024 0359   GLUCOSE 119 (H) 01/01/2024 0359   BUN 7 (L) 01/01/2024 0359   CREATININE 0.68 01/01/2024 0359   CALCIUM 9.1 01/01/2024 0359   GFRNONAA >60 01/01/2024 0359   GFRAA >60 12/04/2019 0523    INR    Component Value Date/Time   INR 1.0 12/26/2023 1259     Intake/Output Summary (Last 24 hours) at 01/01/2024 1106 Last data filed at 01/01/2024 0600 Gross per 24 hour  Intake 340 ml  Output 450 ml  Net -110 ml     Assessment/Plan:  63 y.o. male is s/p catheter placement into the left SFA and profunda femoris arteries from the left brachial approach for an aortogram with selective left lower extremity angiogram. Patient also underwent mechanical thrombectomy of the left common and external iliac arteries, left common femoral and profunda femoris arteries with stent placement to the left external iliac artery and stent placement to the aorta and most proximal common iliac artery.  Patient  is also postop day #1 from irrigation and excisional debridement of the skin, soft tissue and muscle to the right above-the-knee amputation site.  Had a nondisposable wound VAC dressing placed with antibiotic beads. 1 Day Post-Op   Plan Discontinue heparin infusion today. Restart patient's on Eliquis 5 mg tablets twice daily and aspirin 81 mg daily and Lipitor 40 mg daily. Patient's wound VAC to be changed in 1 week.  He can return to vein and vascular's office for this wound VAC change on 01/07/24.  Patient will need home health  nurse for wound VAC changes twice weekly after next week's visit in clinic for wound VAC change.  Expected date of home health nurse needed will be Friday, 01/11/2024.  DVT prophylaxis: Heparin infusion to be converted to oral anticoagulation today   [name] [facility] 01/01/2024 11:06 AM

## 2024-01-08 ENCOUNTER — Ambulatory Visit (INDEPENDENT_AMBULATORY_CARE_PROVIDER_SITE_OTHER): Admitting: Vascular Surgery

## 2024-01-08 ENCOUNTER — Encounter (INDEPENDENT_AMBULATORY_CARE_PROVIDER_SITE_OTHER): Payer: Self-pay | Admitting: Vascular Surgery

## 2024-01-08 ENCOUNTER — Telehealth (INDEPENDENT_AMBULATORY_CARE_PROVIDER_SITE_OTHER): Payer: Self-pay

## 2024-01-08 VITALS — BP 123/84 | HR 58 | Resp 16

## 2024-01-08 DIAGNOSIS — E785 Hyperlipidemia, unspecified: Secondary | ICD-10-CM

## 2024-01-08 DIAGNOSIS — Z89611 Acquired absence of right leg above knee: Secondary | ICD-10-CM

## 2024-01-08 DIAGNOSIS — I70222 Atherosclerosis of native arteries of extremities with rest pain, left leg: Secondary | ICD-10-CM

## 2024-01-08 NOTE — Telephone Encounter (Signed)
 Steward Drone called stating that she had no way of lifting Javis to get him to appt today at 10:45 am. She requested a call back but I didn't get an answer and her voicemail is full.

## 2024-01-08 NOTE — Assessment & Plan Note (Signed)
 Perfusion was markedly improved and a very difficult situation requiring brachial artery cutdown and extension of stent septin to an occluded distal aorta and down almost to the femoral head.  Seems to be significantly improved clinically as well.  Reperfusion syndrome has lessened and he has only mild swelling at this point.  Follow-up in the next 2 to 3 months with noninvasive studies.

## 2024-01-08 NOTE — Progress Notes (Signed)
 MRN : [id number]  Brendan Williams is a 63 y.o. (10-09-1961) male who presents with chief complaint of  Chief Complaint  Patient presents with   Routine Post Op    [facility] 1 week follow up  .  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  We were able to successfully revascularize his left lower extremity through a left brachial cutdown and treatment of aortic and iliac artery occlusion.  He has done surprisingly well from this.  He had reperfusion pain and swelling but this is significantly improved as well.  He is on Eliquis and aspirin daily.  He is also on Lipitor. We also debrided an opening in the midportion of his right above-knee amputation site.  Antibiotic beads were placed and some of these are still present today.  The site is not erythematous or having significant drainage.  He was unable to get home health services so we are having to do his VAC changes in the office.  Current Outpatient Medications  Medication Sig Dispense Refill   apixaban (ELIQUIS) 5 MG TABS tablet Take 1 tablet (5 mg total) by mouth 2 (two) times daily.     ascorbic acid (VITAMIN C) 500 MG tablet Take 1 tablet (500 mg total) by mouth daily. 30 tablet 2   aspirin EC 81 MG tablet Take 1 tablet (81 mg total) by mouth daily.     atorvastatin (LIPITOR) 40 MG tablet Take 1 tablet (40 mg total) by mouth daily. 30 tablet 1   busPIRone (BUSPAR) 5 MG tablet Take 1 tablet (5 mg total) by mouth 3 (three) times daily as needed (anxiety). 30 tablet 0   ciprofloxacin (CIPRO) 250 MG tablet Take 3 tablets (750 mg total) by mouth 2 (two) times daily. 60 tablet 0   cyanocobalamin 1000 MCG tablet Take 1 tablet (1,000 mcg total) by mouth daily. 30 tablet 2   dexlansoprazole (DEXILANT) 60 MG capsule Take 1 capsule (60 mg total) by mouth as needed. 30 capsule 0   gabapentin  (NEURONTIN) 300 MG capsule Take 1 capsule (300 mg total) by mouth 2 (two) times daily. 60 capsule 1   iron polysaccharides (NIFEREX) 150 MG capsule Take 1 capsule (150 mg total) by mouth daily. 30 capsule 2   midodrine (PROAMATINE) 5 MG tablet Take 1 tablet (5 mg total) by mouth 3 (three) times daily as needed (If systolic BP less than 100 mmHg). 90 tablet 0   ondansetron (ZOFRAN) 4 MG tablet Take 1 tablet (4 mg total) by mouth every 8 (eight) hours as needed for nausea or vomiting. 20 tablet 0   oxyCODONE-acetaminophen (PERCOCET) 7.5-325 MG tablet Take 1 tablet by mouth every 4 (four) hours as needed for severe pain (pain score 7-10). 35 tablet 0   Vitamin D, Ergocalciferol, (DRISDOL) 1.25 MG (50000 UNIT) CAPS capsule Take 1 capsule (50,000 Units total) by mouth every 7 (seven) days. 12 capsule 0   No current facility-administered medications for this visit.    Past Medical History:  Diagnosis Date   Anxiety    situational   Asthma    as a child   Atherosclerosis of native arteries of extremity with intermittent claudication (HCC)    Cellulitis and abscess of toe of right foot    Critical limb ischemia of right lower extremity (HCC)    Current smoker    Quit  04/16/23   Peripheral arterial disease [facility])     Past Surgical History:  Procedure Laterality Date   AMPUTATION Right 10/12/2023   Procedure: AMPUTATION ABOVE KNEE;  Surgeon: Annice Needy, MD;  Location: [facility] OR[facility];  Service: General;  Laterality: Right;   AMPUTATION TOE Right 12/02/2019   Procedure: AMPUTATION RIGHT 5TH TOE;  Surgeon: Rosetta Posner, DPM;  Location: [facility];  Service: Podiatry;  Laterality: Right;   APPLICATION OF WOUND VAC Bilateral 11/28/2019   Procedure: APPLICATION OF WOUND VAC;  Surgeon: Annice Needy, MD;  Location: [facility];  Service: Vascular;  Laterality: Bilateral;  Prevena    APPLICATION OF WOUND VAC Right 12/31/2023   Procedure: APPLICATION, WOUND VAC;  Surgeon: Annice Needy, MD;  Location: [facility];   Service: Vascular;  Laterality: Right;   BACK SURGERY     lower ruptured disk   CENTRAL VENOUS CATHETER INSERTION  11/28/2019   Procedure: INSERTION CENTRAL LINE ADULT;  Surgeon: Annice Needy, MD;  Location: [facility];  Service: Vascular;;   ENDARTERECTOMY FEMORAL Bilateral 11/28/2019   Procedure: ENDARTERECTOMY FEMORAL;  Surgeon: Annice Needy, MD;  Location: [facility];  Service: Vascular;  Laterality: Bilateral;   ENDARTERECTOMY FEMORAL Right 05/23/2023   Procedure: ENDARTERECTOMY FEMORAL REDO;  Surgeon: Annice Needy, MD;  Location: [facility];  Service: Vascular;  Laterality: Right;   ENDOVASCULAR REPAIR/STENT GRAFT Right 05/23/2023   Procedure: ENDOVASCULAR REPAIR/STENT GRAFT;  Surgeon: Annice Needy, MD;  Location: [facility];  Service: Vascular;  Laterality: Right;  Right SFA Stents   INCISION AND DRAINAGE OF WOUND Right 12/31/2023   Procedure: IRRIGATION AND DEBRIDEMENT WOUND;  Surgeon: Annice Needy, MD;  Location: [facility];  Service: Vascular;  Laterality: Right;  RIGHT AKA STUMP WOUND   LOWER EXTREMITY ANGIOGRAPHY Right 11/14/2019   Procedure: LOWER EXTREMITY ANGIOGRAPHY;  Surgeon: Annice Needy, MD;  Location: [facility];  Service: Cardiovascular;  Laterality: Right;   LOWER EXTREMITY ANGIOGRAPHY Right 12/19/2021   Procedure: Lower Extremity Angiography;  Surgeon: Annice Needy, MD;  Location: [facility];  Service: Cardiovascular;  Laterality: Right;   LOWER EXTREMITY ANGIOGRAPHY Right 01/30/2022   Procedure: Lower Extremity Angiography;  Surgeon: Annice Needy, MD;  Location: [facility];  Service: Cardiovascular;  Laterality: Right;   LOWER EXTREMITY ANGIOGRAPHY Right 05/21/2023   Procedure: Lower Extremity Angiography;  Surgeon: Annice Needy, MD;  Location: [facility];  Service: Cardiovascular;  Laterality: Right;   LOWER EXTREMITY ANGIOGRAPHY Right 09/28/2023   Procedure: Lower Extremity Angiography;  Surgeon: Annice Needy, MD;  Location: [facility]  [facility];  Service: Cardiovascular;  Laterality: Right;   LOWER EXTREMITY ANGIOGRAPHY Left 12/27/2023   Procedure: Lower Extremity Angiography;  Surgeon: Annice Needy, MD;  Location: [facility];  Service: Cardiovascular;  Laterality: Left;   MECHANICAL THROMBECTOMY WITH AORTOGRAM AND INTERVENTION  05/23/2023   Procedure: MECHANICAL THROMBECTOMY right popliteal artery;  Surgeon: Annice Needy, MD;  Location: [facility];  Service: Vascular;;   STOMACH SURGERY     exploratory   TONSILECTOMY, ADENOIDECTOMY, BILATERAL MYRINGOTOMY AND TUBES     TONSILLECTOMY     TRANSMETATARSAL AMPUTATION Right 06/21/2023   Procedure: TRANSMETATARSAL AMPUTATION;  Surgeon: Rosetta Posner, DPM;  Location[facility];  Service: Orthopedics/Podiatry;  Laterality: Right;     Social History   Tobacco Use   Smoking status: Former    Current packs/day: 0.00    Average packs/day: 1 pack/day for  40.5 years (40.5 ttl pk-yrs)    Types: Cigarettes    Start date: 63    Quit date: 04/16/2023    Years since quitting: 0.7   Smokeless tobacco: Never   Tobacco comments:    Last smoked 3-4 days ago  Vaping Use   Vaping status: Never Used  Substance Use Topics   Alcohol use: Yes    Alcohol/week: 5.0 standard drinks of alcohol    Types: 5 Cans of beer per week    Comment: 4-5 beers a week   Drug use: Yes    Frequency: 7.0 times per week    Types: Marijuana    Comment: a joint daily      Family History  Problem Relation Age of Onset   Leukemia Mother    Heart disease Father        a. CABG   Heart disease Sister        a. MVR   Cancer Sister    Cancer Brother    CAD Brother        a. CABG    No Known Allergies   REVIEW OF SYSTEMS (Negative unless checked)  Constitutional: [] Weight loss  [] Fever  [] Chills Cardiac: [] Chest pain   [] Chest pressure   [] Palpitations   [] Shortness of breath when laying flat   [] Shortness of breath at rest   [] Shortness of breath with exertion. Vascular:  [x] Pain  in legs with walking   [x] Pain in legs at rest   [] Pain in legs when laying flat   [] Claudication   [] Pain in feet when walking  [] Pain in feet at rest  [] Pain in feet when laying flat   [] History of DVT   [] Phlebitis   [x] Swelling in legs   [] Varicose veins   [x] Non-healing ulcers Pulmonary:   [] Uses home oxygen   [] Productive cough   [] Hemoptysis   [] Wheeze  [] COPD   [] Asthma Neurologic:  [] Dizziness  [] Blackouts   [] Seizures   [] History of stroke   [] History of TIA  [] Aphasia   [] Temporary blindness   [] Dysphagia   [] Weakness or numbness in arms   [] Weakness or numbness in legs Musculoskeletal:  [] Arthritis   [] Joint swelling   [x] Joint pain   [] Low back pain Hematologic:  [] Easy bruising  [] Easy bleeding   [] Hypercoagulable state   [] Anemic   Gastrointestinal:  [] Blood in stool   [] Vomiting blood  [] Gastroesophageal reflux/heartburn   [] Abdominal pain Genitourinary:  [] Chronic kidney disease   [] Difficult urination  [] Frequent urination  [] Burning with urination   [] Hematuria Skin:  [] Rashes   [x] Ulcers   [x] Wounds Psychological:  [] History of anxiety   []  History of major depression.  Physical Examination  BP 123/84   Pulse (!) 58   Resp 16  Gen:  WD/WN, NAD Head: [place]/AT, No temporalis wasting. Ear/Nose/Throat: Hearing grossly intact, nares w/o erythema or drainage Eyes: Conjunctiva clear. Sclera non-icteric Neck: Supple.  Trachea midline Pulmonary:  Good air movement, no use of accessory muscles.  Cardiac: RRR, no JVD Vascular:  Vessel Right Left  Radial Palpable Palpable                          PT Not Palpable 1+ Palpable  DP Not Palpable Not Palpable    Musculoskeletal: M/S 5/5 throughout.  No deformity or atrophy.  Right above-knee amputation site with about a 3 cm circular wound with decent granulation tissue.  Some stimulan to biotic beads are still in place.  VAC  was changed today.  Left lower extremity is warm with good capillary refill.  Trace left lower extremity  edema. Neurologic: Sensation grossly intact in extremities.  Symmetrical.  Speech is fluent.  Psychiatric: Judgment intact, Mood & affect appropriate for pt's clinical situation.       Labs Recent Results (from the past 2160 hours)  CBC with Differential     Status: Abnormal   Collection Time: 10/11/23 11:33 AM  Result Value Ref Range   WBC 22.2 (H) 4.0 - 10.5 K/uL   RBC 4.21 (L) 4.22 - 5.81 MIL/uL   Hemoglobin 12.5 (L) 13.0 - 17.0 g/dL   HCT 13.0 (L) 86.5 - 78.4 %   MCV 88.4 80.0 - 100.0 fL   MCH 29.7 26.0 - 34.0 pg   MCHC 33.6 30.0 - 36.0 g/dL   RDW 69.6 29.5 - 28.4 %   Platelets 664 (H) 150 - 400 K/uL   nRBC 0.0 0.0 - 0.2 %   Neutrophils Relative % 92 %   Neutro Abs 20.4 (H) 1.7 - 7.7 K/uL   Lymphocytes Relative 3 %   Lymphs Abs 0.7 0.7 - 4.0 K/uL   Monocytes Relative 4 %   Monocytes Absolute 0.9 0.1 - 1.0 K/uL   Eosinophils Relative 0 %   Eosinophils Absolute 0.0 0.0 - 0.5 K/uL   Basophils Relative 0 %   Basophils Absolute 0.0 0.0 - 0.1 K/uL   Immature Granulocytes 1 %   Abs Immature Granulocytes 0.22 (H) 0.00 - 0.07 K/uL    Comment: Performed at [facility], [address] [place], [place] [address]  Comprehensive metabolic panel     Status: Abnormal   Collection Time: 10/11/23 11:33 AM  Result Value Ref Range   Sodium 134 (L) 135 - 145 mmol/L   Potassium 4.1 3.5 - 5.1 mmol/L   Chloride 93 (L) 98 - 111 mmol/L   CO2 25 22 - 32 mmol/L   Glucose, Bld 136 (H) 70 - 99 mg/dL    Comment: Glucose reference range applies only to samples taken after fasting for at least 8 hours.   BUN 11 8 - 23 mg/dL   Creatinine, Ser 0.10 0.61 - 1.24 mg/dL   Calcium 8.9 8.9 - 27.2 mg/dL   Total Protein 7.7 6.5 - 8.1 g/dL   Albumin 3.0 (L) 3.5 - 5.0 g/dL   AST 22 15 - 41 U/L   ALT 13 0 - 44 U/L   Alkaline Phosphatase 80 38 - 126 U/L   Total Bilirubin 0.4 <1.2 mg/dL   GFR, Estimated >53 >66 mL/min    Comment: (NOTE) Calculated using the CKD-EPI Creatinine Equation  (2021)    Anion gap 16 (H) 5 - 15    Comment: Performed at [facility], [address] [place], [place] [address]  Lactic acid, plasma     Status: Abnormal   Collection Time: 10/11/23 11:33 AM  Result Value Ref Range   Lactic Acid, Venous 2.9 (HH) 0.5 - 1.9 mmol/L    Comment: CRITICAL RESULT CALLED TO, READ BACK BY AND VERIFIED WITH DEE McCLAIN 10/11/23 1210 MU Performed at [facility], [address] [place], [place] [address]   Blood Culture (routine x 2)     Status: None   Collection Time: 10/11/23  2:20 PM   Specimen: BLOOD  Result Value Ref Range   Specimen Description BLOOD LEFT ANTECUBITAL    Special Requests      BOTTLES DRAWN AEROBIC AND ANAEROBIC Blood Culture adequate  volume   Culture      NO GROWTH 5 DAYS Performed at [facility], [address] [place] [place], [place] [address]    Report Status 10/18/2023 FINAL   Protime-INR     Status: None   Collection Time: 10/11/23  2:31 PM  Result Value Ref Range   Prothrombin Time 14.6 11.4 - 15.2 seconds   INR 1.1 0.8 - 1.2    Comment: (NOTE) INR goal varies based on device and disease states. Performed at [facility], [address] [place], [place] [address]   APTT     Status: Abnormal   Collection Time: 10/11/23  2:31 PM  Result Value Ref Range   aPTT 47 (H) 24 - 36 seconds    Comment:        IF BASELINE aPTT IS ELEVATED, SUGGEST PATIENT RISK ASSESSMENT BE USED TO DETERMINE APPROPRIATE ANTICOAGULANT THERAPY. Performed at [facility], [address] [place], [place] [address]   Heparin level (unfractionated)     Status: None   Collection Time: 10/11/23  2:31 PM  Result Value Ref Range   Heparin Unfractionated 0.65 0.30 - 0.70 IU/mL    Comment: (NOTE) The clinical reportable range upper limit is being lowered to >1.10 to align with the FDA approved guidance for the current laboratory assay.  If heparin results are below expected values, and patient dosage  has  been confirmed, suggest follow up testing of antithrombin III levels. Performed at [facility], [address] [place], [place] [address]   Blood Culture (routine x 2)     Status: None   Collection Time: 10/11/23  2:35 PM   Specimen: BLOOD RIGHT FOREARM  Result Value Ref Range   Specimen Description      BLOOD RIGHT FOREARM Performed at [facility], [address] [place], [place] [address]    Special Requests      BOTTLES DRAWN AEROBIC AND ANAEROBIC Blood Culture adequate volume Performed at [facility], [address] [place], [place] [address]    Culture  Setup Time      NO ORGANISMS SEEN Performed at [facility], [address], [place], [place] [address]    Culture      NO GROWTH 5 DAYS Performed at [facility], [address] [place], [place] [address]    Report Status 10/16/2023 FINAL   Hemoglobin A1c     Status: Abnormal   Collection Time: 10/11/23  4:36 PM  Result Value Ref Range   Hgb A1c MFr Bld 6.2 (H) 4.8 - 5.6 %    Comment: (NOTE)         Prediabetes: 5.7 - 6.4         Diabetes: >6.4         Glycemic control for adults with diabetes: <7.0    Mean Plasma Glucose 131 mg/dL    Comment: (NOTE) Performed At: [facility] [address] [place], [place] [id number] Jolene Schimke MD NU:[id number]   Sedimentation rate     Status: Abnormal   Collection Time: 10/11/23  4:36 PM  Result Value Ref Range   Sed Rate 70 (H) 0 - 20 mm/hr    Comment: Performed at [facility], [address] [place], [place] [address]  C-reactive protein     Status: Abnormal   Collection Time: 10/11/23  4:36 PM  Result Value Ref Range   CRP 9.2 (H) <1.0 mg/dL    Comment: Performed at  [facility], [address], [place], [place] [address]  Prealbumin     Status: Abnormal   Collection Time: 10/11/23  4:36 PM  Result Value Ref Range   Prealbumin <5 (L) 18 - 38 mg/dL    Comment: Performed at [facility], [address] [place], [place] [address]  Lactic acid, plasma     Status: Abnormal   Collection Time: 10/11/23  4:36 PM  Result Value Ref Range   Lactic Acid, Venous 5.0 (HH) 0.5 - 1.9 mmol/L    Comment: CRITICAL VALUE NOTED. VALUE IS CONSISTENT WITH PREVIOUSLY REPORTED/CALLED VALUE SKL Performed at [facility], [address] [place], [place] [address]   Procalcitonin     Status: None   Collection Time: 10/11/23  4:36 PM  Result Value Ref Range   Procalcitonin 1.59 ng/mL    Comment:        Interpretation: PCT > 0.5 ng/mL and <= 2 ng/mL: Systemic infection (sepsis) is possible, but other conditions are known to elevate PCT as well. (NOTE)       Sepsis PCT Algorithm           Lower Respiratory Tract                                      Infection PCT Algorithm    ----------------------------     ----------------------------         PCT < 0.25 ng/mL                PCT < 0.10 ng/mL          Strongly encourage             Strongly discourage   discontinuation of antibiotics    initiation of antibiotics    ----------------------------     -----------------------------       PCT 0.25 - 0.50 ng/mL            PCT 0.10 - 0.25 ng/mL               OR       >80% decrease in PCT            Discourage initiation of                                            antibiotics      Encourage discontinuation           of antibiotics    ----------------------------     -----------------------------         PCT >= 0.50 ng/mL              PCT 0.26 - 0.50 ng/mL                AND       <80% decrease in PCT             Encourage initiation of                                             antibiotics       Encourage continuation  of antibiotics    ----------------------------     -----------------------------        PCT >= 0.50 ng/mL                  PCT > 0.50 ng/mL               AND         increase in PCT                  Strongly encourage                                       initiation of antibiotics    Strongly encourage escalation           of antibiotics                                     -----------------------------                                           PCT <= 0.25 ng/mL                                                 OR                                        > 80% decrease in PCT                                      Discontinue / Do not initiate                                             antibiotics  Performed at [facility], [address] [place], [place] [address]   CBG monitoring, ED     Status: None   Collection Time: 10/11/23  4:49 PM  Result Value Ref Range   Glucose-Capillary 71 70 - 99 mg/dL    Comment: Glucose reference range applies only to samples taken after fasting for at least 8 hours.  Lactic acid, plasma     Status: Abnormal   Collection Time: 10/11/23  8:55 PM  Result Value Ref Range   Lactic Acid, Venous 2.1 (HH) 0.5 - 1.9 mmol/L    Comment: CRITICAL VALUE NOTED. VALUE IS CONSISTENT WITH PREVIOUSLY REPORTED/CALLED VALUE SKL Performed at [facility], [address] [place], [place] [address]   CBG monitoring, ED     Status: Abnormal   Collection Time: 10/11/23 10:17 PM  Result Value Ref Range   Glucose-Capillary 117 (H) 70 - 99 mg/dL    Comment: Glucose reference range applies only to samples taken after fasting for at least 8 hours.  Surgical pathology     Status: None   Collection Time: 10/12/23  12:00 AM  Result Value Ref Range   SURGICAL PATHOLOGY      SURGICAL PATHOLOGY [facility] [address] [place], [place] [address] Telephone [phone] or [phone] Fax [phone]  REPORT OF SURGICAL PATHOLOGY   Accession #: [id number] Patient Name: Brendan Williams, Brendan Williams Visit # : [id number]  MRN: [id number] Physician: Annice Needy DOB/Age [birth year] (Age: 53) Gender: M Collected Date: 10/12/2023 Received Date: 10/12/2023  FINAL DIAGNOSIS        1. Leg, amputation, Right AKA :       - BENIGN SKIN AND SOFT TISSUE WITH ISCHEMIC CHANGES, ACUTE INFLAMMATION AND      NECROSIS      - REACTIVE BONE WITH ACUTE AND CHRONIC OSTEOMYELITIS      - MODERATE TO SEVERE CALCIFIC ATHEROSCLEROSIS WITH FOCAL THROMBUS FORMATION      - BONE MARGIN NEGATIVE FOR ACUTE OSTEOMYELITIS      - SOFT TISSUE RESECTION MARGIN IS GROSSLY VIABLE       2. Thrombus, Right superficial iliac clot :       - CALCIFIC ATHEROSCLEROSIS AND FEATURES CONSISTENT WITH THROMBUS       ELECTRONIC SIGNATURE : Kanteti M.D., Ar chana P., Pathologist, Electronic Signature  MICROSCOPIC DESCRIPTION  CASE COMMENTS STAINS USED IN DIAGNOSIS: H&E H&E H&E H&E H&E H&E H&E    CLINICAL HISTORY  SPECIMEN(S) OBTAINED 1. Leg, amputation, Right AKA 2. Thrombus, Right Superficial Iliac Clot  SPECIMEN COMMENTS: 2. Iliac clot SPECIMEN CLINICAL INFORMATION: 1. PAD    Gross Description 1. "Right above knee amputation", received fresh is a right above knee amputation with the forefoot surgically absent (18.5 cm from heel to forefoot, 55.0 cm from heel to knee, 9.0 cm from knee to soft tissue margin, with 9.0 cm of femur extending past the soft tissue margin).The superior and inferior surfaces of the foot are severely edematous with prominent blue-purple discoloration.The previous forefoot amputation site is minmally scarred.There are multiple areas of purple-black, indurated tissue on the forefoot, up to 4.5 cm ingreatest dimension.      Sectioning reveals moderately edematous, necrotic soft  tissue of the forefoot      and edematous muscle and soft tissue of the lower leg. The midfoot bones are      markedly softened. The bony and soft tissue margins appear grossly viable. The      anterior and posterior tibial arteries are severely atherosclerotic up to 95%      occlusion. Block summary:      1A: Midfoot bone (following decalcification)      1B: Marrow from bony margin  (following decalcification      1C: Femoral artery cross section      1D: Anterior tibial artery cross-sections      1E: Posterior tibial artery cross-sections      43F: Skin from forefoot      SMB      10/12/23 2. "Right superficial femoral artery - iliac clot", received fresh and palced in formalin is a 2.5 x 2.0 x 0.7 cm aggregate of focally calcified thrombotic material.Sectioning reveals calcified and fibrotic cut surfaces.Representative sections are submitted in block 1A.      SMB      10/12/23        Report signed out from the following location(s) [facility] [address] [place] [place], [place] [address] CLIA #: [id number]  [facility] [address] [place], [place] [address] CLIA #: [id number]  Lactic acid, plasma     Status: None   Collection Time: 10/12/23  1:35 AM  Result Value Ref Range   Lactic Acid, Venous 1.2 0.5 - 1.9 mmol/L    Comment: Performed at [facility], [address] [place], [place] [address]  Blood gas, venous     Status: Abnormal   Collection Time: 10/12/23  1:35 AM  Result Value Ref Range   pH, Ven 7.4 7.25 - 7.43   pCO2, Ven 41 (L) 44 - 60 mmHg   pO2, Ven 55 (H) 32 - 45 mmHg   Bicarbonate 25.4 20.0 - 28.0 mmol/L   Acid-Base Excess 0.5 0.0 - 2.0 mmol/L   O2 Saturation 85.4 %   Patient temperature 37.0    Collection site VEIN     Comment: Performed at [facility], [address] [place], [place] [address]  CBC     Status: Abnormal   Collection Time: 10/12/23  4:11 AM  Result Value Ref Range   WBC 24.8 (H) 4.0 - 10.5 K/uL   RBC 3.30 (L) 4.22 - 5.81 MIL/uL   Hemoglobin 9.9 (L) 13.0 - 17.0 g/dL   HCT 69.6 (L) 29.5 - 28.4 %   MCV 91.8 80.0 - 100.0 fL   MCH 30.0 26.0 - 34.0 pg   MCHC 32.7 30.0 - 36.0 g/dL   RDW 13.2 44.0 - 10.2 %   Platelets 540 (H) 150 - 400 K/uL   nRBC 0.0 0.0 - 0.2 %    Comment: Performed at [facility], [address], [place], [place] [address]   Basic metabolic panel     Status: Abnormal   Collection Time: 10/12/23  4:11 AM  Result Value Ref Range   Sodium 138 135 - 145 mmol/L   Potassium 4.1 3.5 - 5.1 mmol/L   Chloride 101 98 - 111 mmol/L   CO2 27 22 - 32 mmol/L   Glucose, Bld 105 (H) 70 - 99 mg/dL    Comment: Glucose reference range applies only to samples taken after fasting for at least 8 hours.   BUN 10 8 - 23 mg/dL   Creatinine, Ser 6.44 0.61 - 1.24 mg/dL   Calcium 8.1 (L) 8.9 - 10.3 mg/dL   GFR, Estimated >03 >47 mL/min    Comment: (NOTE) Calculated using the CKD-EPI Creatinine Equation (2021)    Anion gap 10 5 - 15    Comment: Performed at [facility], [address] [place], [place] [address]  Heparin level (unfractionated)     Status: None   Collection Time: 10/12/23  4:11 AM  Result Value Ref Range   Heparin Unfractionated 0.41 0.30 - 0.70 IU/mL    Comment: (NOTE) The clinical reportable range upper limit is being lowered to >1.10 to align with the FDA approved guidance for the current laboratory assay.  If heparin results are below expected values, and patient dosage has  been confirmed, suggest follow up testing of antithrombin III levels. Performed at [facility], [address] [place], [place] [address]   APTT     Status: Abnormal   Collection Time: 10/12/23  4:11 AM  Result Value Ref Range   aPTT 89 (H) 24 - 36 seconds    Comment:        IF BASELINE aPTT IS ELEVATED, SUGGEST PATIENT RISK ASSESSMENT BE USED TO DETERMINE APPROPRIATE ANTICOAGULANT THERAPY. Performed at [facility], [address], [place], [place] [address]   Hepatic function panel  Status: Abnormal   Collection Time: 10/12/23  4:11 AM  Result Value Ref Range   Total Protein 6.1 (L) 6.5 - 8.1 g/dL   Albumin 2.3 (L) 3.5 - 5.0 g/dL   AST 17 15 - 41 U/L   ALT 10 0 - 44 U/L   Alkaline Phosphatase 58 38 - 126 U/L   Total Bilirubin 0.4 <1.2 mg/dL   Bilirubin, Direct <1.6 0.0 - 0.2 mg/dL    Indirect Bilirubin NOT CALCULATED 0.3 - 0.9 mg/dL    Comment: Performed at [facility], [address], [place], [place] [address]  Magnesium     Status: None   Collection Time: 10/12/23  4:11 AM  Result Value Ref Range   Magnesium 1.9 1.7 - 2.4 mg/dL    Comment: Performed at [facility], [address] [place], [place] [address]  Hemoglobin A1c     Status: Abnormal   Collection Time: 10/12/23  4:11 AM  Result Value Ref Range   Hgb A1c MFr Bld 6.3 (H) 4.8 - 5.6 %    Comment: (NOTE)         Prediabetes: 5.7 - 6.4         Diabetes: >6.4         Glycemic control for adults with diabetes: <7.0    Mean Plasma Glucose 134 mg/dL    Comment: (NOTE) Performed At: [facility] [address] [place], [place] [id number] Jolene Schimke MD [id number]   Heparin level (unfractionated)     Status: Abnormal   Collection Time: 10/12/23 12:30 PM  Result Value Ref Range   Heparin Unfractionated 0.22 (L) 0.30 - 0.70 IU/mL    Comment: (NOTE) The clinical reportable range upper limit is being lowered to >1.10 to align with the FDA approved guidance for the current laboratory assay.  If heparin results are below expected values, and patient dosage has  been confirmed, suggest follow up testing of antithrombin III levels. Performed at [facility], [address] [place], [place] [address]   Glucose, capillary     Status: Abnormal   Collection Time: 10/12/23 12:54 PM  Result Value Ref Range   Glucose-Capillary 122 (H) 70 - 99 mg/dL    Comment: Glucose reference range applies only to samples taken after fasting for at least 8 hours.  Heparin level (unfractionated)     Status: Abnormal   Collection Time: 10/12/23  4:28 PM  Result Value Ref Range   Heparin Unfractionated 0.22 (L) 0.30 - 0.70 IU/mL    Comment: (NOTE) The clinical reportable range upper limit is being lowered to >1.10 to align with the FDA approved guidance for the current  laboratory assay.  If heparin results are below expected values, and patient dosage has  been confirmed, suggest follow up testing of antithrombin III levels. Performed at [facility], [address] [place], [place] [address]   APTT     Status: Abnormal   Collection Time: 10/12/23 11:19 PM  Result Value Ref Range   aPTT 54 (H) 24 - 36 seconds    Comment:        IF BASELINE aPTT IS ELEVATED, SUGGEST PATIENT RISK ASSESSMENT BE USED TO DETERMINE APPROPRIATE ANTICOAGULANT THERAPY. Performed at [facility], [address] [place], [place] [address]   CBC     Status: Abnormal   Collection Time: 10/13/23  5:48 AM  Result Value Ref Range   WBC 15.8 (H) 4.0 - 10.5 K/uL   RBC 2.77 (L) 4.22 -  5.81 MIL/uL   Hemoglobin 8.3 (L) 13.0 - 17.0 g/dL   HCT 09.8 (L) 11.9 - 14.7 %   MCV 90.3 80.0 - 100.0 fL   MCH 30.0 26.0 - 34.0 pg   MCHC 33.2 30.0 - 36.0 g/dL   RDW 82.9 56.2 - 13.0 %   Platelets 478 (H) 150 - 400 K/uL   nRBC 0.0 0.0 - 0.2 %    Comment: Performed at [facility], [address] [place], [place] [address]  APTT     Status: Abnormal   Collection Time: 10/13/23  7:47 AM  Result Value Ref Range   aPTT 59 (H) 24 - 36 seconds    Comment:        IF BASELINE aPTT IS ELEVATED, SUGGEST PATIENT RISK ASSESSMENT BE USED TO DETERMINE APPROPRIATE ANTICOAGULANT THERAPY. Performed at [facility], [address] [place], [place] [address]   Heparin level (unfractionated)     Status: Abnormal   Collection Time: 10/13/23  7:47 AM  Result Value Ref Range   Heparin Unfractionated 0.20 (L) 0.30 - 0.70 IU/mL    Comment: (NOTE) The clinical reportable range upper limit is being lowered to >1.10 to align with the FDA approved guidance for the current laboratory assay.  If heparin results are below expected values, and patient dosage has  been confirmed, suggest follow up testing of antithrombin III levels. Performed at [facility],  [address] [place], [place] [address]   Heparin level (unfractionated)     Status: Abnormal   Collection Time: 10/13/23  3:44 PM  Result Value Ref Range   Heparin Unfractionated 0.23 (L) 0.30 - 0.70 IU/mL    Comment: (NOTE) The clinical reportable range upper limit is being lowered to >1.10 to align with the FDA approved guidance for the current laboratory assay.  If heparin results are below expected values, and patient dosage has  been confirmed, suggest follow up testing of antithrombin III levels. Performed at [facility], [address] [place], [place] [address]   APTT     Status: Abnormal   Collection Time: 10/13/23 11:06 PM  Result Value Ref Range   aPTT 76 (H) 24 - 36 seconds    Comment:        IF BASELINE aPTT IS ELEVATED, SUGGEST PATIENT RISK ASSESSMENT BE USED TO DETERMINE APPROPRIATE ANTICOAGULANT THERAPY. Performed at [facility], [address] [place], [place] [address]   CBC     Status: Abnormal   Collection Time: 10/14/23  5:32 AM  Result Value Ref Range   WBC 14.1 (H) 4.0 - 10.5 K/uL   RBC 3.25 (L) 4.22 - 5.81 MIL/uL   Hemoglobin 9.6 (L) 13.0 - 17.0 g/dL   HCT 25.3 (L) 66.4 - 40.3 %   MCV 87.7 80.0 - 100.0 fL   MCH 29.5 26.0 - 34.0 pg   MCHC 33.7 30.0 - 36.0 g/dL   RDW 47.4 25.9 - 56.3 %   Platelets 526 (H) 150 - 400 K/uL   nRBC 0.0 0.0 - 0.2 %    Comment: Performed at [facility], [address] [place], [place] [address]  Creatinine, serum     Status: None   Collection Time: 10/14/23  5:32 AM  Result Value Ref Range   Creatinine, Ser 0.67 0.61 - 1.24 mg/dL   GFR, Estimated >33 >29 mL/min    Comment: (NOTE) Calculated using the CKD-EPI Creatinine Equation (2021) Performed at [facility], [address]  [place], [place] [address]   Heparin level (unfractionated)     Status: Abnormal   Collection Time: 10/14/23  5:32 AM  Result Value Ref Range   Heparin Unfractionated 0.14 (L) 0.30 - 0.70  IU/mL    Comment: (NOTE) The clinical reportable range upper limit is being lowered to >1.10 to align with the FDA approved guidance for the current laboratory assay.  If heparin results are below expected values, and patient dosage has  been confirmed, suggest follow up testing of antithrombin III levels. Performed at [facility], [address] [place], [place] [address]   APTT     Status: Abnormal   Collection Time: 10/14/23  5:32 AM  Result Value Ref Range   aPTT 42 (H) 24 - 36 seconds    Comment:        IF BASELINE aPTT IS ELEVATED, SUGGEST PATIENT RISK ASSESSMENT BE USED TO DETERMINE APPROPRIATE ANTICOAGULANT THERAPY. Performed at [facility], [address] [place], [place] [address]   Heparin level (unfractionated)     Status: None   Collection Time: 10/14/23  1:48 PM  Result Value Ref Range   Heparin Unfractionated 0.46 0.30 - 0.70 IU/mL    Comment: (NOTE) The clinical reportable range upper limit is being lowered to >1.10 to align with the FDA approved guidance for the current laboratory assay.  If heparin results are below expected values, and patient dosage has  been confirmed, suggest follow up testing of antithrombin III levels. Performed at [facility], [address] [place], [place] [address]   Heparin level (unfractionated)     Status: None   Collection Time: 10/14/23  8:02 PM  Result Value Ref Range   Heparin Unfractionated 0.31 0.30 - 0.70 IU/mL    Comment: (NOTE) The clinical reportable range upper limit is being lowered to >1.10 to align with the FDA approved guidance for the current laboratory assay.  If heparin results are below expected values, and patient dosage has  been confirmed, suggest follow up testing of antithrombin III levels. Performed at [facility], [address] [place], [place] [address]   CBC     Status: Abnormal   Collection Time: 10/15/23  2:43 AM  Result Value Ref Range    WBC 14.0 (H) 4.0 - 10.5 K/uL   RBC 3.10 (L) 4.22 - 5.81 MIL/uL   Hemoglobin 9.2 (L) 13.0 - 17.0 g/dL   HCT 13.0 (L) 86.5 - 78.4 %   MCV 89.0 80.0 - 100.0 fL   MCH 29.7 26.0 - 34.0 pg   MCHC 33.3 30.0 - 36.0 g/dL   RDW 69.6 29.5 - 28.4 %   Platelets 468 (H) 150 - 400 K/uL   nRBC 0.0 0.0 - 0.2 %    Comment: Performed at [facility], [address] [place], [place] [address]  Heparin level (unfractionated)     Status: None   Collection Time: 10/15/23  2:43 AM  Result Value Ref Range   Heparin Unfractionated 0.31 0.30 - 0.70 IU/mL    Comment: (NOTE) The clinical reportable range upper limit is being lowered to >1.10 to align with the FDA approved guidance for the current laboratory assay.  If heparin results are below expected values, and patient dosage has  been confirmed, suggest follow up testing of antithrombin III levels. Performed at [facility], [address] [place], [place] [address]   MRSA Next Gen by PCR, Nasal     Status: None   Collection Time:  10/16/23 12:15 PM   Specimen: Nasal Mucosa; Nasal Swab  Result Value Ref Range   MRSA by PCR Next Gen NOT DETECTED NOT DETECTED    Comment: (NOTE) The GeneXpert MRSA Assay (FDA approved for NASAL specimens only), is one component of a comprehensive MRSA colonization surveillance program. It is not intended to diagnose MRSA infection nor to guide or monitor treatment for MRSA infections. Test performance is not FDA approved in patients less than 88 years old. Performed at [facility], [address] [place], [place] [address]   Lactic acid, plasma     Status: None   Collection Time: 10/16/23  6:01 PM  Result Value Ref Range   Lactic Acid, Venous 0.8 0.5 - 1.9 mmol/L    Comment: Performed at [facility], [address] [place], [place] [address]  CBC with Differential/Platelet     Status: Abnormal   Collection Time: 10/18/23  4:54 AM  Result Value Ref Range   WBC 9.9 4.0 -  10.5 K/uL   RBC 3.05 (L) 4.22 - 5.81 MIL/uL   Hemoglobin 8.9 (L) 13.0 - 17.0 g/dL   HCT 09.8 (L) 11.9 - 14.7 %   MCV 86.2 80.0 - 100.0 fL   MCH 29.2 26.0 - 34.0 pg   MCHC 33.8 30.0 - 36.0 g/dL   RDW 82.9 (H) 56.2 - 13.0 %   Platelets 520 (H) 150 - 400 K/uL   nRBC 0.0 0.0 - 0.2 %   Neutrophils Relative % 62 %   Neutro Abs 6.1 1.7 - 7.7 K/uL   Lymphocytes Relative 21 %   Lymphs Abs 2.1 0.7 - 4.0 K/uL   Monocytes Relative 14 %   Monocytes Absolute 1.4 (H) 0.1 - 1.0 K/uL   Eosinophils Relative 1 %   Eosinophils Absolute 0.1 0.0 - 0.5 K/uL   Basophils Relative 1 %   Basophils Absolute 0.1 0.0 - 0.1 K/uL   Immature Granulocytes 1 %   Abs Immature Granulocytes 0.09 (H) 0.00 - 0.07 K/uL    Comment: Performed at [facility], [address] [place], [place] [address]  Basic metabolic panel     Status: Abnormal   Collection Time: 10/18/23  4:54 AM  Result Value Ref Range   Sodium 138 135 - 145 mmol/L   Potassium 3.6 3.5 - 5.1 mmol/L   Chloride 106 98 - 111 mmol/L   CO2 23 22 - 32 mmol/L   Glucose, Bld 117 (H) 70 - 99 mg/dL    Comment: Glucose reference range applies only to samples taken after fasting for at least 8 hours.   BUN 6 (L) 8 - 23 mg/dL   Creatinine, Ser 4.69 0.61 - 1.24 mg/dL   Calcium 8.0 (L) 8.9 - 10.3 mg/dL   GFR, Estimated >62 >95 mL/min    Comment: (NOTE) Calculated using the CKD-EPI Creatinine Equation (2021)    Anion gap 9 5 - 15    Comment: Performed at [facility], [address] [place], [place] [address]  CBC with Differential/Platelet     Status: Abnormal   Collection Time: 10/19/23  5:20 AM  Result Value Ref Range   WBC 11.2 (H) 4.0 - 10.5 K/uL   RBC 3.19 (L) 4.22 - 5.81 MIL/uL   Hemoglobin 9.2 (L) 13.0 - 17.0 g/dL   HCT 24.4 (L) 01.0 - 27.2 %   MCV 85.9 80.0 - 100.0 fL   MCH 28.8 26.0 - 34.0 pg   MCHC 33.6 30.0 - 36.0 g/dL  RDW 15.6 (H) 11.5 - 15.5 %   Platelets 575 (H) 150 - 400 K/uL   nRBC 0.0 0.0 - 0.2 %   Neutrophils  Relative % 73 %   Neutro Abs 8.0 (H) 1.7 - 7.7 K/uL   Lymphocytes Relative 15 %   Lymphs Abs 1.7 0.7 - 4.0 K/uL   Monocytes Relative 11 %   Monocytes Absolute 1.2 (H) 0.1 - 1.0 K/uL   Eosinophils Relative 0 %   Eosinophils Absolute 0.0 0.0 - 0.5 K/uL   Basophils Relative 0 %   Basophils Absolute 0.0 0.0 - 0.1 K/uL   Immature Granulocytes 1 %   Abs Immature Granulocytes 0.11 (H) 0.00 - 0.07 K/uL    Comment: Performed at [facility], [address] [place], [place] [address]  Basic metabolic panel     Status: Abnormal   Collection Time: 10/19/23  5:20 AM  Result Value Ref Range   Sodium 135 135 - 145 mmol/L   Potassium 4.0 3.5 - 5.1 mmol/L   Chloride 102 98 - 111 mmol/L   CO2 23 22 - 32 mmol/L   Glucose, Bld 138 (H) 70 - 99 mg/dL    Comment: Glucose reference range applies only to samples taken after fasting for at least 8 hours.   BUN 6 (L) 8 - 23 mg/dL   Creatinine, Ser 6.04 0.61 - 1.24 mg/dL   Calcium 8.3 (L) 8.9 - 10.3 mg/dL   GFR, Estimated >54 >09 mL/min    Comment: (NOTE) Calculated using the CKD-EPI Creatinine Equation (2021)    Anion gap 10 5 - 15    Comment: Performed at [facility], [address] [place], [place] [address]  Aerobic culture     Status: Abnormal   Collection Time: 11/20/23  2:47 PM  Result Value Ref Range   Aerobic Bacterial Culture Final report (A)    Organism ID, Bacteria Escherichia coli (A)     Comment: Multi-Drug Resistant Organism Heavy growth    Organism ID, Bacteria Comment (A)     Comment: Pseudomonas aeruginosa Ceftazidime-avibactam and ceftolozane-tazobactam may be considered for therapy ONLY when multi-drug resistance (MDR) is demonstrated to meropenem and other tested agents. Heavy growth    Organism ID, Bacteria Routine flora     Comment: Heavy growth   Antimicrobial Susceptibility Comment     Comment:       ** S = Susceptible; I = Intermediate; R = Resistant **                    P = Positive; N =  Negative             MICS are expressed in micrograms per mL    Antibiotic                 RSLT#1    RSLT#2    RSLT#3    RSLT#4 Amoxicillin/Clavulanic Acid    R Ampicillin                     R Cefazolin                      R Cefepime                       S         S Cefoxitin  R Cefpodoxime                    R Ceftazidime/avibactam                    S Ceftolozane/tazobactam                   S Ceftriaxone                    R Ciprofloxacin                  S         S Ertapenem                      S Gentamicin                     S Levofloxacin                   R         S Meropenem                      S         S Piperacillin/Tazobactam        S         S Tetracycline                   S Tobramycin                     S         S Trimethoprim/Sulfa             S   CBC     Status: Abnormal   Collection Time: 12/26/23 12:31 PM  Result Value Ref Range   WBC 9.2 4.0 - 10.5 K/uL   RBC 4.23 4.22 - 5.81 MIL/uL   Hemoglobin 12.2 (L) 13.0 - 17.0 g/dL   HCT 16.1 (L) 09.6 - 04.5 %   MCV 87.7 80.0 - 100.0 fL   MCH 28.8 26.0 - 34.0 pg   MCHC 32.9 30.0 - 36.0 g/dL   RDW 40.9 81.1 - 91.4 %   Platelets 324 150 - 400 K/uL   nRBC 0.0 0.0 - 0.2 %    Comment: Performed at [facility], [address] [place], [place] [address]  Comprehensive metabolic panel     Status: Abnormal   Collection Time: 12/26/23 12:31 PM  Result Value Ref Range   Sodium 137 135 - 145 mmol/L   Potassium 4.6 3.5 - 5.1 mmol/L    Comment: HEMOLYSIS AT THIS LEVEL MAY AFFECT RESULT   Chloride 102 98 - 111 mmol/L   CO2 23 22 - 32 mmol/L   Glucose, Bld 87 70 - 99 mg/dL    Comment: Glucose reference range applies only to samples taken after fasting for at least 8 hours.   BUN 10 8 - 23 mg/dL   Creatinine, Ser 6.21 0.61 - 1.24 mg/dL   Calcium 9.1 8.9 - 30.8 mg/dL   Total Protein 7.3 6.5 - 8.1 g/dL   Albumin 3.5 3.5 - 5.0 g/dL   AST 29 15 - 41 U/L    Comment: HEMOLYSIS AT THIS  LEVEL MAY AFFECT RESULT   ALT 13 0 - 44 U/L    Comment: HEMOLYSIS AT THIS LEVEL MAY AFFECT RESULT   Alkaline Phosphatase 73 38 -  126 U/L   Total Bilirubin 1.9 (H) 0.0 - 1.2 mg/dL    Comment: HEMOLYSIS AT THIS LEVEL MAY AFFECT RESULT   GFR, Estimated >60 >60 mL/min    Comment: (NOTE) Calculated using the CKD-EPI Creatinine Equation (2021)    Anion gap 12 5 - 15    Comment: Performed at [facility], [address] [place], [place] [address]  Heparin level (unfractionated)     Status: None   Collection Time: 12/26/23 12:31 PM  Result Value Ref Range   Heparin Unfractionated 0.44 0.30 - 0.70 IU/mL    Comment: (NOTE) The clinical reportable range upper limit is being lowered to >1.10 to align with the FDA approved guidance for the current laboratory assay.  If heparin results are below expected values, and patient dosage has  been confirmed, suggest follow up testing of antithrombin III levels. Performed at [facility], [address] [place], [place] [address]   APTT     Status: None   Collection Time: 12/26/23 12:31 PM  Result Value Ref Range   aPTT 34 24 - 36 seconds    Comment: Performed at [facility], [address] [place], [place] [address]  Protime-INR     Status: None   Collection Time: 12/26/23 12:59 PM  Result Value Ref Range   Prothrombin Time 13.4 11.4 - 15.2 seconds   INR 1.0 0.8 - 1.2    Comment: (NOTE) INR goal varies based on device and disease states. Performed at [facility], [address] [place], [place] [address]   APTT     Status: None   Collection Time: 12/26/23 12:59 PM  Result Value Ref Range   aPTT 35 24 - 36 seconds    Comment: Performed at [facility], [address] [place], [place] [address]  MRSA Next Gen by PCR, Nasal     Status: None   Collection Time: 12/26/23  2:48 PM   Specimen: Nasal Mucosa; Nasal Swab  Result Value Ref Range   MRSA by PCR Next Gen NOT DETECTED NOT DETECTED     Comment: (NOTE) The GeneXpert MRSA Assay (FDA approved for NASAL specimens only), is one component of a comprehensive MRSA colonization surveillance program. It is not intended to diagnose MRSA infection nor to guide or monitor treatment for MRSA infections. Test performance is not FDA approved in patients less than 26 years old. Performed at [facility], [address] [place], [place] [address]   Heparin level (unfractionated)     Status: None   Collection Time: 12/26/23  7:22 PM  Result Value Ref Range   Heparin Unfractionated 0.48 0.30 - 0.70 IU/mL    Comment: (NOTE) The clinical reportable range upper limit is being lowered to >1.10 to align with the FDA approved guidance for the current laboratory assay.  If heparin results are below expected values, and patient dosage has  been confirmed, suggest follow up testing of antithrombin III levels. Performed at [facility], [address] [place], [place] [address]   Heparin level (unfractionated)     Status: None   Collection Time: 12/27/23  7:30 AM  Result Value Ref Range   Heparin Unfractionated 0.34 0.30 - 0.70 IU/mL    Comment: (NOTE) The clinical reportable range upper limit is being lowered to >1.10 to align with the FDA approved guidance for the current laboratory assay.  If heparin results are below expected values, and patient dosage has  been confirmed, suggest follow up  testing of antithrombin III levels. Performed at [facility], [address] [place], [place] [address]   CBC     Status: Abnormal   Collection Time: 12/27/23  9:12 AM  Result Value Ref Range   WBC 7.9 4.0 - 10.5 K/uL   RBC 3.53 (L) 4.22 - 5.81 MIL/uL   Hemoglobin 10.5 (L) 13.0 - 17.0 g/dL   HCT 57.3 (L) 22.0 - 25.4 %   MCV 90.1 80.0 - 100.0 fL   MCH 29.7 26.0 - 34.0 pg   MCHC 33.0 30.0 - 36.0 g/dL   RDW 27.0 62.3 - 76.2 %   Platelets 272 150 - 400 K/uL   nRBC 0.0 0.0 - 0.2 %    Comment: Performed at  [facility], [address], [place], [place] [address]  Basic metabolic panel     Status: Abnormal   Collection Time: 12/27/23  4:40 PM  Result Value Ref Range   Sodium 139 135 - 145 mmol/L   Potassium 3.7 3.5 - 5.1 mmol/L   Chloride 107 98 - 111 mmol/L   CO2 21 (L) 22 - 32 mmol/L   Glucose, Bld 181 (H) 70 - 99 mg/dL    Comment: Glucose reference range applies only to samples taken after fasting for at least 8 hours.   BUN 10 8 - 23 mg/dL   Creatinine, Ser 7.61 0.61 - 1.24 mg/dL   Calcium 8.7 (L) 8.9 - 10.3 mg/dL   GFR, Estimated >60 >73 mL/min    Comment: (NOTE) Calculated using the CKD-EPI Creatinine Equation (2021)    Anion gap 11 5 - 15    Comment: Performed at [facility], [address] [place], [place] [address]  Heparin level (unfractionated)     Status: None   Collection Time: 12/27/23  7:09 PM  Result Value Ref Range   Heparin Unfractionated 0.44 0.30 - 0.70 IU/mL    Comment: (NOTE) The clinical reportable range upper limit is being lowered to >1.10 to align with the FDA approved guidance for the current laboratory assay.  If heparin results are below expected values, and patient dosage has  been confirmed, suggest follow up testing of antithrombin III levels. Performed at [facility], [address] [place], [place] [address]   Basic metabolic panel     Status: Abnormal   Collection Time: 12/28/23  1:08 AM  Result Value Ref Range   Sodium 136 135 - 145 mmol/L   Potassium 3.7 3.5 - 5.1 mmol/L   Chloride 104 98 - 111 mmol/L   CO2 21 (L) 22 - 32 mmol/L   Glucose, Bld 233 (H) 70 - 99 mg/dL    Comment: Glucose reference range applies only to samples taken after fasting for at least 8 hours.   BUN 13 8 - 23 mg/dL   Creatinine, Ser 4.62 0.61 - 1.24 mg/dL   Calcium 8.3 (L) 8.9 - 10.3 mg/dL   GFR, Estimated >70 >35 mL/min    Comment: (NOTE) Calculated using the CKD-EPI Creatinine Equation (2021)    Anion gap 11 5 - 15    Comment:  Performed at [facility], [address] [place], [place] [address]  CBC     Status: Abnormal   Collection Time: 12/28/23  1:08 AM  Result Value Ref Range   WBC 10.4 4.0 - 10.5 K/uL   RBC 2.95 (L) 4.22 - 5.81 MIL/uL   Hemoglobin 8.5 (L) 13.0 - 17.0 g/dL   HCT 18.2 (L) 99.3 -  52.0 %   MCV 89.8 80.0 - 100.0 fL   MCH 28.8 26.0 - 34.0 pg   MCHC 32.1 30.0 - 36.0 g/dL   RDW 16.1 09.6 - 04.5 %   Platelets 232 150 - 400 K/uL   nRBC 0.0 0.0 - 0.2 %    Comment: Performed at [facility], [address], [place], [place] [address]  Magnesium     Status: None   Collection Time: 12/28/23  1:08 AM  Result Value Ref Range   Magnesium 1.7 1.7 - 2.4 mg/dL    Comment: Performed at [facility], [address] [place], [place] [address]  Phosphorus     Status: None   Collection Time: 12/28/23  1:08 AM  Result Value Ref Range   Phosphorus 2.8 2.5 - 4.6 mg/dL    Comment: Performed at [facility], [address] [place], [place] [address]  Heparin level (unfractionated)     Status: None   Collection Time: 12/28/23  1:08 AM  Result Value Ref Range   Heparin Unfractionated 0.41 0.30 - 0.70 IU/mL    Comment: (NOTE) The clinical reportable range upper limit is being lowered to >1.10 to align with the FDA approved guidance for the current laboratory assay.  If heparin results are below expected values, and patient dosage has  been confirmed, suggest follow up testing of antithrombin III levels. Performed at [facility], [address] [place], [place] [address]   Iron and TIBC     Status: Abnormal   Collection Time: 12/28/23  1:08 AM  Result Value Ref Range   Iron 23 (L) 45 - 182 ug/dL   TIBC 657 (L) 846 - 962 ug/dL   Saturation Ratios 11 (L) 17.9 - 39.5 %   UIBC 197 ug/dL    Comment: Performed at [facility], [address] [place], [place] [address]  Folate     Status: None   Collection Time: 12/28/23  1:08 AM  Result Value  Ref Range   Folate 15.6 >5.9 ng/mL    Comment: Performed at [facility], [address] [place], [place] [address]  Vitamin B12     Status: None   Collection Time: 12/28/23 10:47 AM  Result Value Ref Range   Vitamin B-12 207 180 - 914 pg/mL    Comment: (NOTE) This assay is not validated for testing neonatal or myeloproliferative syndrome specimens for Vitamin B12 levels. Performed at [facility], [address] [place], [place] [address]   VITAMIN D 25 Hydroxy (Vit-D Deficiency, Fractures)     Status: Abnormal   Collection Time: 12/28/23 10:47 AM  Result Value Ref Range   Vit D, 25-Hydroxy 15.06 (L) 30 - 100 ng/mL    Comment: (NOTE) Vitamin D deficiency has been defined by the Institute of Medicine  and an Endocrine Society practice guideline as a level of serum 25-OH  vitamin D less than 20 ng/mL (1,2). The Endocrine Society went on to  further define vitamin D insufficiency as a level between 21 and 29  ng/mL (2).  1. IOM (Institute of Medicine). 2010. Dietary reference intakes for  calcium and D. [place]: [employer]. 2. Holick MF, Binkley Mayer, Bischoff-Ferrari HA, et al. Evaluation,  treatment, and prevention of vitamin D deficiency: an Endocrine  Society clinical practice guideline, JCEM. 2011 Jul; 96(7): 1911-30.  Performed at [facility], [address] [place], [place] [address]   Basic metabolic panel  Status: Abnormal   Collection Time: 12/29/23  2:27 AM  Result Value Ref Range   Sodium 136 135 - 145 mmol/L   Potassium 3.8 3.5 - 5.1 mmol/L   Chloride 105 98 - 111 mmol/L   CO2 23 22 - 32 mmol/L   Glucose, Bld 106 (H) 70 - 99 mg/dL    Comment: Glucose reference range applies only to samples taken after fasting for at least 8 hours.   BUN 11 8 - 23 mg/dL   Creatinine, Ser 9.14 0.61 - 1.24 mg/dL   Calcium 8.3 (L) 8.9 - 10.3 mg/dL   GFR, Estimated >78 >29 mL/min    Comment: (NOTE) Calculated using the CKD-EPI  Creatinine Equation (2021)    Anion gap 8 5 - 15    Comment: Performed at [facility], [address] [place], [place] [address]  CBC     Status: Abnormal   Collection Time: 12/29/23  2:27 AM  Result Value Ref Range   WBC 10.2 4.0 - 10.5 K/uL   RBC 2.81 (L) 4.22 - 5.81 MIL/uL   Hemoglobin 8.2 (L) 13.0 - 17.0 g/dL   HCT 08.6 (L) 57.8 - 46.9 %   MCV 89.7 80.0 - 100.0 fL   MCH 29.2 26.0 - 34.0 pg   MCHC 32.5 30.0 - 36.0 g/dL   RDW 62.9 52.8 - 41.3 %   Platelets 253 150 - 400 K/uL   nRBC 0.0 0.0 - 0.2 %    Comment: Performed at [facility], [address], [place], [place] [address]  Magnesium     Status: None   Collection Time: 12/29/23  2:27 AM  Result Value Ref Range   Magnesium 2.0 1.7 - 2.4 mg/dL    Comment: Performed at [facility], [address] [place], [place] [address]  Phosphorus     Status: Abnormal   Collection Time: 12/29/23  2:27 AM  Result Value Ref Range   Phosphorus 2.4 (L) 2.5 - 4.6 mg/dL    Comment: Performed at [facility], [address] [place], [place] [address]  Heparin level (unfractionated)     Status: Abnormal   Collection Time: 12/29/23  2:27 AM  Result Value Ref Range   Heparin Unfractionated 0.24 (L) 0.30 - 0.70 IU/mL    Comment: (NOTE) The clinical reportable range upper limit is being lowered to >1.10 to align with the FDA approved guidance for the current laboratory assay.  If heparin results are below expected values, and patient dosage has  been confirmed, suggest follow up testing of antithrombin III levels. Performed at [facility], [address] [place], [place] [address]   Heparin level (unfractionated)     Status: Abnormal   Collection Time: 12/29/23 10:52 AM  Result Value Ref Range   Heparin Unfractionated 0.14 (L) 0.30 - 0.70 IU/mL    Comment: (NOTE) The clinical reportable range upper limit is being lowered to >1.10 to align with the FDA approved guidance for the  current laboratory assay.  If heparin results are below expected values, and patient dosage has  been confirmed, suggest follow up testing of antithrombin III levels. Performed at [facility], [address] [place], [place] [address]   Heparin level (unfractionated)     Status: None   Collection Time: 12/29/23  6:45 PM  Result Value Ref Range   Heparin Unfractionated 0.41 0.30 - 0.70 IU/mL    Comment: (NOTE) The clinical reportable range upper limit is being lowered to >1.10  to align with the FDA approved guidance for the current laboratory assay.  If heparin results are below expected values, and patient dosage has  been confirmed, suggest follow up testing of antithrombin III levels. Performed at [facility], [address] [place], [place] [address]   Heparin level (unfractionated)     Status: Abnormal   Collection Time: 12/30/23 12:47 AM  Result Value Ref Range   Heparin Unfractionated <0.10 (L) 0.30 - 0.70 IU/mL    Comment: (NOTE) The clinical reportable range upper limit is being lowered to >1.10 to align with the FDA approved guidance for the current laboratory assay.  If heparin results are below expected values, and patient dosage has  been confirmed, suggest follow up testing of antithrombin III levels. Performed at [facility], [address] [place], [place] [address]   Basic metabolic panel     Status: Abnormal   Collection Time: 12/30/23  4:51 AM  Result Value Ref Range   Sodium 138 135 - 145 mmol/L   Potassium 3.6 3.5 - 5.1 mmol/L   Chloride 105 98 - 111 mmol/L   CO2 25 22 - 32 mmol/L   Glucose, Bld 121 (H) 70 - 99 mg/dL    Comment: Glucose reference range applies only to samples taken after fasting for at least 8 hours.   BUN 6 (L) 8 - 23 mg/dL   Creatinine, Ser 5.62 0.61 - 1.24 mg/dL   Calcium 8.6 (L) 8.9 - 10.3 mg/dL   GFR, Estimated >13 >08 mL/min    Comment: (NOTE) Calculated using the CKD-EPI Creatinine  Equation (2021)    Anion gap 8 5 - 15    Comment: Performed at [facility], [address] [place], [place] [address]  CBC     Status: Abnormal   Collection Time: 12/30/23  4:51 AM  Result Value Ref Range   WBC 9.4 4.0 - 10.5 K/uL   RBC 2.78 (L) 4.22 - 5.81 MIL/uL   Hemoglobin 8.0 (L) 13.0 - 17.0 g/dL   HCT 69.6 (L) 29.5 - 28.4 %   MCV 88.1 80.0 - 100.0 fL   MCH 28.8 26.0 - 34.0 pg   MCHC 32.7 30.0 - 36.0 g/dL   RDW 13.2 44.0 - 10.2 %   Platelets 261 150 - 400 K/uL   nRBC 0.0 0.0 - 0.2 %    Comment: Performed at [facility], [address], [place], [place] [address]  Magnesium     Status: None   Collection Time: 12/30/23  4:51 AM  Result Value Ref Range   Magnesium 1.8 1.7 - 2.4 mg/dL    Comment: Performed at [facility], [address] [place], [place] [address]  Phosphorus     Status: None   Collection Time: 12/30/23  4:51 AM  Result Value Ref Range   Phosphorus 3.5 2.5 - 4.6 mg/dL    Comment: Performed at [facility], [address] [place], [place] [address]  Heparin level (unfractionated)     Status: None   Collection Time: 12/30/23  8:00 AM  Result Value Ref Range   Heparin Unfractionated 0.39 0.30 - 0.70 IU/mL    Comment: (NOTE) The clinical reportable range upper limit is being lowered to >1.10 to align with the FDA approved guidance for the current laboratory assay.  If heparin results are below expected values, and patient dosage has  been confirmed, suggest follow up testing of antithrombin III levels. Performed at [facility], [address] [place], [place] [address]   Heparin level (unfractionated)     Status: None   Collection Time: 12/30/23  1:34 PM  Result Value Ref Range   Heparin Unfractionated 0.40 0.30 - 0.70 IU/mL    Comment: (NOTE) The clinical reportable range upper limit is being lowered to >1.10 to align with the FDA approved guidance for the current laboratory assay.  If heparin  results are below expected values, and patient dosage has  been confirmed, suggest follow up testing of antithrombin III levels. Performed at [facility], [address] [place], [place] [address]   Basic metabolic panel     Status: Abnormal   Collection Time: 12/31/23  2:20 AM  Result Value Ref Range   Sodium 138 135 - 145 mmol/L   Potassium 3.5 3.5 - 5.1 mmol/L   Chloride 106 98 - 111 mmol/L   CO2 25 22 - 32 mmol/L   Glucose, Bld 137 (H) 70 - 99 mg/dL    Comment: Glucose reference range applies only to samples taken after fasting for at least 8 hours.   BUN 6 (L) 8 - 23 mg/dL   Creatinine, Ser 6.57 (L) 0.61 - 1.24 mg/dL   Calcium 9.0 8.9 - 84.6 mg/dL   GFR, Estimated >96 >29 mL/min    Comment: (NOTE) Calculated using the CKD-EPI Creatinine Equation (2021)    Anion gap 7 5 - 15    Comment: Performed at [facility], [address] [place], [place] [address]  CBC     Status: Abnormal   Collection Time: 12/31/23  2:20 AM  Result Value Ref Range   WBC 9.1 4.0 - 10.5 K/uL   RBC 2.86 (L) 4.22 - 5.81 MIL/uL   Hemoglobin 8.2 (L) 13.0 - 17.0 g/dL   HCT 32.4 (L) 40.1 - 02.7 %   MCV 88.5 80.0 - 100.0 fL   MCH 28.7 26.0 - 34.0 pg   MCHC 32.4 30.0 - 36.0 g/dL   RDW 25.3 66.4 - 40.3 %   Platelets 281 150 - 400 K/uL   nRBC 0.0 0.0 - 0.2 %    Comment: Performed at [facility], [address] [place], [place] [address]  Magnesium     Status: None   Collection Time: 12/31/23  2:20 AM  Result Value Ref Range   Magnesium 2.0 1.7 - 2.4 mg/dL    Comment: Performed at [facility], [address] [place], [place] [address]  Phosphorus     Status: None   Collection Time: 12/31/23  2:20 AM  Result Value Ref Range   Phosphorus 3.6 2.5 - 4.6 mg/dL    Comment: Performed at [facility], [address] [place], [place] [address]  Heparin level (unfractionated)     Status: Abnormal   Collection Time: 12/31/23  2:20 AM  Result Value Ref  Range   Heparin Unfractionated <0.10 (L) 0.30 - 0.70 IU/mL    Comment: (NOTE) The clinical reportable range upper limit is being lowered to >1.10 to align with the FDA approved guidance for the current laboratory assay.  If heparin results are below expected values, and patient dosage has  been confirmed, suggest follow up testing of antithrombin III levels. Performed at [facility], [address] [place], [place] [address]   Heparin level (unfractionated)     Status: None   Collection Time: 12/31/23  6:09 AM  Result Value Ref Range   Heparin Unfractionated 0.30 0.30 - 0.70 IU/mL  Comment: (NOTE) The clinical reportable range upper limit is being lowered to >1.10 to align with the FDA approved guidance for the current laboratory assay.  If heparin results are below expected values, and patient dosage has  been confirmed, suggest follow up testing of antithrombin III levels. Performed at [facility], [address] [place], [place] [address]   Basic metabolic panel     Status: Abnormal   Collection Time: 01/01/24  3:59 AM  Result Value Ref Range   Sodium 137 135 - 145 mmol/L   Potassium 3.7 3.5 - 5.1 mmol/L   Chloride 104 98 - 111 mmol/L   CO2 26 22 - 32 mmol/L   Glucose, Bld 119 (H) 70 - 99 mg/dL    Comment: Glucose reference range applies only to samples taken after fasting for at least 8 hours.   BUN 7 (L) 8 - 23 mg/dL   Creatinine, Ser 6.04 0.61 - 1.24 mg/dL   Calcium 9.1 8.9 - 54.0 mg/dL   GFR, Estimated >98 >11 mL/min    Comment: (NOTE) Calculated using the CKD-EPI Creatinine Equation (2021)    Anion gap 7 5 - 15    Comment: Performed at [facility], [address] [place], [place] [address]  CBC     Status: Abnormal   Collection Time: 01/01/24  3:59 AM  Result Value Ref Range   WBC 9.3 4.0 - 10.5 K/uL   RBC 2.68 (L) 4.22 - 5.81 MIL/uL   Hemoglobin 7.8 (L) 13.0 - 17.0 g/dL   HCT 29.5 (L) 62.1 - 30.8 %   MCV 89.2 80.0 -  100.0 fL   MCH 29.1 26.0 - 34.0 pg   MCHC 32.6 30.0 - 36.0 g/dL   RDW 65.7 84.6 - 96.2 %   Platelets 325 150 - 400 K/uL   nRBC 0.0 0.0 - 0.2 %    Comment: Performed at [facility], [address] [place], [place] [address]  Magnesium     Status: None   Collection Time: 01/01/24  3:59 AM  Result Value Ref Range   Magnesium 2.0 1.7 - 2.4 mg/dL    Comment: Performed at [facility], [address] [place], [place] [address]  Phosphorus     Status: None   Collection Time: 01/01/24  3:59 AM  Result Value Ref Range   Phosphorus 3.1 2.5 - 4.6 mg/dL    Comment: Performed at [facility], [address] [place], [place] [address]  Heparin level (unfractionated)     Status: None   Collection Time: 01/01/24  3:59 AM  Result Value Ref Range   Heparin Unfractionated 0.33 0.30 - 0.70 IU/mL    Comment: (NOTE) The clinical reportable range upper limit is being lowered to >1.10 to align with the FDA approved guidance for the current laboratory assay.  If heparin results are below expected values, and patient dosage has  been confirmed, suggest follow up testing of antithrombin III levels. Performed at [facility], [address] [place], [place] [address]     Radiology PERIPHERAL VASCULAR CATHETERIZATION Result Date: 12/27/2023 See surgical note for result.   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  Seems to be significantly improved clinically as well.  Reperfusion syndrome has lessened and he has only mild swelling at  this point.  Follow-up in the next 2 to 3 months with noninvasive studies.  Hyperlipidemia lipid control important in reducing the progression of atherosclerotic disease. Continue statin therapy   Hx of AKA  (above knee amputation), right (HCC) Small residual wound on the right side seems to be improved after debridement.  He cannot get home health services we will do weekly VAC changes in the office.    Festus Barren, MD  01/08/2024 5:21 PM    This note was created with Dragon medical transcription system.  Any errors from dictation are purely unintentional

## 2024-01-08 NOTE — Telephone Encounter (Signed)
 Justine made his appt and a [place] change with Dew.

## 2024-01-08 NOTE — Assessment & Plan Note (Signed)
 lipid control important in reducing the progression of atherosclerotic disease. Continue statin therapy

## 2024-01-08 NOTE — Assessment & Plan Note (Signed)
 Small residual wound on the right side seems to be improved after debridement.  He cannot get home health services we will do weekly VAC changes in the office.

## 2024-01-09 ENCOUNTER — Telehealth (INDEPENDENT_AMBULATORY_CARE_PROVIDER_SITE_OTHER): Payer: Self-pay

## 2024-01-09 ENCOUNTER — Other Ambulatory Visit (INDEPENDENT_AMBULATORY_CARE_PROVIDER_SITE_OTHER): Payer: Self-pay | Admitting: Nurse Practitioner

## 2024-01-09 MED ORDER — OXYCODONE-ACETAMINOPHEN 7.5-325 MG PO TABS: 1.0000 | ORAL_TABLET | ORAL | 0 refills | Status: DC | PRN

## 2024-01-09 NOTE — Telephone Encounter (Signed)
 sent

## 2024-01-09 NOTE — Telephone Encounter (Signed)
 Steward Drone called to see if pain medication will be sent in for [place] today. He was seen by Dr. Wyn Quaker yesterday.   Please advise

## 2024-01-16 ENCOUNTER — Ambulatory Visit (INDEPENDENT_AMBULATORY_CARE_PROVIDER_SITE_OTHER): Admitting: Nurse Practitioner

## 2024-01-16 ENCOUNTER — Encounter (INDEPENDENT_AMBULATORY_CARE_PROVIDER_SITE_OTHER): Payer: Self-pay

## 2024-01-16 VITALS — BP 126/94 | HR 62 | Resp 18

## 2024-01-16 DIAGNOSIS — Z89611 Acquired absence of right leg above knee: Secondary | ICD-10-CM

## 2024-01-16 NOTE — Progress Notes (Signed)
 Wound Vac change on right AKA

## 2024-01-21 ENCOUNTER — Other Ambulatory Visit (INDEPENDENT_AMBULATORY_CARE_PROVIDER_SITE_OTHER): Payer: Self-pay | Admitting: Nurse Practitioner

## 2024-01-21 ENCOUNTER — Telehealth (INDEPENDENT_AMBULATORY_CARE_PROVIDER_SITE_OTHER): Payer: Self-pay

## 2024-01-21 MED ORDER — OXYCODONE-ACETAMINOPHEN 7.5-325 MG PO TABS: 1.0000 | ORAL_TABLET | ORAL | 0 refills | Status: DC | PRN

## 2024-01-21 NOTE — Telephone Encounter (Signed)
 sent

## 2024-01-21 NOTE — Telephone Encounter (Signed)
 Patient spouse left a message requesting for pain medication refill for her husband. Please Advise

## 2024-01-21 NOTE — Telephone Encounter (Signed)
 Patient notified that refill has been sent. Patient also informed that he having some leakage from vac but has used tape to hold it together. Patient will contact the office if there any changes with the wound vac.

## 2024-01-23 ENCOUNTER — Encounter (INDEPENDENT_AMBULATORY_CARE_PROVIDER_SITE_OTHER): Payer: Self-pay

## 2024-01-23 ENCOUNTER — Ambulatory Visit (INDEPENDENT_AMBULATORY_CARE_PROVIDER_SITE_OTHER): Admitting: Nurse Practitioner

## 2024-01-23 VITALS — BP 165/95 | HR 58 | Resp 18 | Ht 73.0 in | Wt 170.0 lb

## 2024-01-23 DIAGNOSIS — Z89611 Acquired absence of right leg above knee: Secondary | ICD-10-CM

## 2024-01-23 MED ORDER — APIXABAN 5 MG PO TABS: 5.0000 mg | ORAL_TABLET | Freq: Two times a day (BID) | ORAL | 3 refills | Status: AC

## 2024-01-23 MED ORDER — ASPIRIN EC 81 MG PO TBEC: 81.0000 mg | DELAYED_RELEASE_TABLET | Freq: Every day | ORAL | 1 refills | Status: AC

## 2024-01-23 NOTE — Progress Notes (Signed)
 Wound Vac change on right AKA

## 2024-01-24 ENCOUNTER — Ambulatory Visit (INDEPENDENT_AMBULATORY_CARE_PROVIDER_SITE_OTHER): Admitting: Nurse Practitioner

## 2024-01-24 ENCOUNTER — Telehealth (INDEPENDENT_AMBULATORY_CARE_PROVIDER_SITE_OTHER): Payer: Self-pay

## 2024-01-24 DIAGNOSIS — Z89611 Acquired absence of right leg above knee: Secondary | ICD-10-CM

## 2024-01-24 NOTE — Progress Notes (Signed)
 Patient came for wound vac placement due to the vac coming off. Patient will return next week as scheduled.

## 2024-01-24 NOTE — Telephone Encounter (Signed)
 Patient left a message stating that his wound vac came off and requested for medical advice. Patient will come in the office today to have wound vac placed on.

## 2024-01-27 ENCOUNTER — Encounter (INDEPENDENT_AMBULATORY_CARE_PROVIDER_SITE_OTHER): Payer: Self-pay | Admitting: Nurse Practitioner

## 2024-01-30 ENCOUNTER — Ambulatory Visit (INDEPENDENT_AMBULATORY_CARE_PROVIDER_SITE_OTHER)

## 2024-01-31 ENCOUNTER — Encounter (INDEPENDENT_AMBULATORY_CARE_PROVIDER_SITE_OTHER): Payer: Self-pay

## 2024-01-31 ENCOUNTER — Ambulatory Visit (INDEPENDENT_AMBULATORY_CARE_PROVIDER_SITE_OTHER): Admitting: Nurse Practitioner

## 2024-01-31 VITALS — BP 134/92 | HR 82 | Resp 16

## 2024-01-31 DIAGNOSIS — T8789 Other complications of amputation stump: Secondary | ICD-10-CM

## 2024-01-31 NOTE — Progress Notes (Signed)
 Patient came in today for wound vac change. The wound was cleaned and wound vac was placed back on. Patient will return next week as scheduled.

## 2024-02-03 ENCOUNTER — Encounter (INDEPENDENT_AMBULATORY_CARE_PROVIDER_SITE_OTHER): Payer: Self-pay | Admitting: Nurse Practitioner

## 2024-02-04 ENCOUNTER — Other Ambulatory Visit (INDEPENDENT_AMBULATORY_CARE_PROVIDER_SITE_OTHER): Payer: Self-pay | Admitting: Nurse Practitioner

## 2024-02-04 NOTE — Telephone Encounter (Signed)
 I have refilled his Percocet.  However the other two need to be filled by his PCP.  These were only sent in as a courtesy while he established with his PCP

## 2024-02-05 NOTE — Telephone Encounter (Signed)
 Pt states understanding and will call his PCP to fill these meds.

## 2024-02-06 ENCOUNTER — Ambulatory Visit (INDEPENDENT_AMBULATORY_CARE_PROVIDER_SITE_OTHER)

## 2024-02-06 DIAGNOSIS — I70222 Atherosclerosis of native arteries of extremities with rest pain, left leg: Secondary | ICD-10-CM

## 2024-02-07 LAB — VAS US ABI WITH/WO TBI: Left ABI: 0.72

## 2024-02-08 ENCOUNTER — Ambulatory Visit (INDEPENDENT_AMBULATORY_CARE_PROVIDER_SITE_OTHER): Admitting: Vascular Surgery

## 2024-02-08 ENCOUNTER — Encounter (INDEPENDENT_AMBULATORY_CARE_PROVIDER_SITE_OTHER): Payer: Self-pay | Admitting: Vascular Surgery

## 2024-02-08 VITALS — BP 115/80 | HR 65 | Resp 18

## 2024-02-08 DIAGNOSIS — E785 Hyperlipidemia, unspecified: Secondary | ICD-10-CM

## 2024-02-08 DIAGNOSIS — Z89611 Acquired absence of right leg above knee: Secondary | ICD-10-CM

## 2024-02-08 DIAGNOSIS — I70221 Atherosclerosis of native arteries of extremities with rest pain, right leg: Secondary | ICD-10-CM | POA: Diagnosis not present

## 2024-02-08 DIAGNOSIS — T8789 Other complications of amputation stump: Secondary | ICD-10-CM

## 2024-02-08 NOTE — Progress Notes (Signed)
 MRN : [id number]  Brendan Williams is a 63 y.o. ([birth year]) male who presents with chief complaint of  Chief Complaint  Patient presents with   Follow-up    fu 1 month  .  History of Present Illness: Patient returns today in follow up of his PAD.  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  It has good granulation tissue.  The wound has continued to decrease in size. As for his left leg, remains somewhat discolored and cool.  He does have pain in it with activity but does not seem to have rest pain.  No ulceration.  He underwent extensive left iliac and aortic intervention for limb salvage a few months ago from a brachial approach.  His brachial access site is well-healed.  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  After intervention, he was out of rest pain and no longer had immediate limb threatening symptoms. ABI recently was 0.72 on the left. He has a known left SFA occlusion but his aorta/iliac intervention is patent.   Current Outpatient Medications  Medication Sig Dispense Refill   apixaban  (ELIQUIS ) 5 MG TABS tablet Take 1 tablet (5 mg total) by mouth 2 (two) times daily. 60 tablet 3   ascorbic acid  (VITAMIN C ) 500 MG tablet Take 1 tablet (500 mg total) by mouth daily. 30 tablet 2   aspirin  EC 81 MG tablet Take 1 tablet (81 mg total) by mouth daily. 90 tablet 1   atorvastatin  (LIPITOR ) 40 MG tablet Take 1 tablet (40 mg total) by mouth daily. 30 tablet 1   busPIRone  (BUSPAR ) 5 MG tablet Take 1 tablet (5 mg total) by mouth 3 (three) times daily as needed (anxiety). 30 tablet 0   ciprofloxacin  (CIPRO ) 250 MG tablet Take 3 tablets (750 mg total) by mouth 2 (two) times daily. 60 tablet 0   cyanocobalamin  1000 MCG tablet Take 1 tablet (1,000 mcg total) by mouth daily. 30 tablet 2   dexlansoprazole  (DEXILANT ) 60 MG capsule Take 1 capsule (60 mg total) by mouth as needed. 30 capsule 0   gabapentin  (NEURONTIN ) 300 MG capsule Take 1 capsule (300 mg  total) by mouth 2 (two) times daily. 60 capsule 1   iron  polysaccharides (NIFEREX) 150 MG capsule Take 1 capsule (150 mg total) by mouth daily. 30 capsule 2   ondansetron  (ZOFRAN ) 4 MG tablet Take 1 tablet (4 mg total) by mouth every 8 (eight) hours as needed for nausea or vomiting. 20 tablet 0   oxyCODONE -acetaminophen  (PERCOCET) 7.5-325 MG tablet TAKE 1 TABLET BY MOUTH EVERY 4 HOURS AS NEEDED FOR SEVERE PAIN 35 tablet 0   Vitamin D , Ergocalciferol , (DRISDOL ) 1.25 MG (50000 UNIT) CAPS capsule Take 1 capsule (50,000 Units total) by mouth every 7 (seven) days. 12 capsule 0   No current facility-administered medications for this visit.    Past Medical History:  Diagnosis Date   Anxiety    situational   Asthma    as a child   Atherosclerosis of native arteries of extremity with intermittent claudication (HCC)    Cellulitis and abscess of toe of right foot    Critical limb ischemia of right lower extremity (HCC)    Current smoker    Quit 04/16/23   Peripheral arterial disease [facility])     Past Surgical History:  Procedure Laterality Date   AMPUTATION Right 10/12/2023   Procedure: AMPUTATION ABOVE KNEE;  Surgeon: Celso College, MD;  Location: [facility]  ORS;  Service: General;  Laterality: Right;   AMPUTATION TOE Right 12/02/2019   Procedure: AMPUTATION RIGHT 5TH TOE;  Surgeon: Pink Bridges, DPM;  Location: [facility] ORS;  Service: Podiatry;  Laterality: Right;   APPLICATION OF WOUND VAC Bilateral 11/28/2019   Procedure: APPLICATION OF WOUND VAC;  Surgeon: Celso College, MD;  Location: [facility] ORS;  Service: Vascular;  Laterality: Bilateral;  Prevena    APPLICATION OF WOUND VAC Right 12/31/2023   Procedure: APPLICATION, WOUND VAC;  Surgeon: Celso College, MD;  Location: [facility] ORS;  Service: Vascular;  Laterality: Right;   BACK SURGERY     lower ruptured disk   CENTRAL VENOUS CATHETER INSERTION  11/28/2019   Procedure: INSERTION CENTRAL LINE ADULT;  Surgeon: Celso College, MD;  Location: [facility] ORS;  Service:  Vascular;;   ENDARTERECTOMY FEMORAL Bilateral 11/28/2019   Procedure: ENDARTERECTOMY FEMORAL;  Surgeon: Celso College, MD;  Location: [facility] ORS;  Service: Vascular;  Laterality: Bilateral;   ENDARTERECTOMY FEMORAL Right 05/23/2023   Procedure: ENDARTERECTOMY FEMORAL REDO;  Surgeon: Celso College, MD;  Location: [facility] ORS;  Service: Vascular;  Laterality: Right;   ENDOVASCULAR REPAIR/STENT GRAFT Right 05/23/2023   Procedure: ENDOVASCULAR REPAIR/STENT GRAFT;  Surgeon: Celso College, MD;  Location: [facility] ORS;  Service: Vascular;  Laterality: Right;  Right SFA Stents   INCISION AND DRAINAGE OF WOUND Right 12/31/2023   Procedure: IRRIGATION AND DEBRIDEMENT WOUND;  Surgeon: Celso College, MD;  Location: [facility] ORS;  Service: Vascular;  Laterality: Right;  RIGHT AKA STUMP WOUND   LOWER EXTREMITY ANGIOGRAPHY Right 11/14/2019   Procedure: LOWER EXTREMITY ANGIOGRAPHY;  Surgeon: Celso College, MD;  Location: [facility] LAB;  Service: Cardiovascular;  Laterality: Right;   LOWER EXTREMITY ANGIOGRAPHY Right 12/19/2021   Procedure: Lower Extremity Angiography;  Surgeon: Celso College, MD;  Location: [facility];  Service: Cardiovascular;  Laterality: Right;   LOWER EXTREMITY ANGIOGRAPHY Right 01/30/2022   Procedure: Lower Extremity Angiography;  Surgeon: Celso College, MD;  Location: [facility];  Service: Cardiovascular;  Laterality: Right;   LOWER EXTREMITY ANGIOGRAPHY Right 05/21/2023   Procedure: Lower Extremity Angiography;  Surgeon: Celso College, MD;  Location: [facility];  Service: Cardiovascular;  Laterality: Right;   LOWER EXTREMITY ANGIOGRAPHY Right 09/28/2023   Procedure: Lower Extremity Angiography;  Surgeon: Celso College, MD;  Location: [facility];  Service: Cardiovascular;  Laterality: Right;   LOWER EXTREMITY ANGIOGRAPHY Left 12/27/2023   Procedure: Lower Extremity Angiography;  Surgeon: Celso College, MD;  Location: [facility];  Service: Cardiovascular;   Laterality: Left;   MECHANICAL THROMBECTOMY WITH AORTOGRAM AND INTERVENTION  05/23/2023   Procedure: MECHANICAL THROMBECTOMY right popliteal artery;  Surgeon: Celso College, MD;  Location: [facility] ORS;  Service: Vascular;;   STOMACH SURGERY     exploratory   TONSILECTOMY, ADENOIDECTOMY, BILATERAL MYRINGOTOMY AND TUBES     TONSILLECTOMY     TRANSMETATARSAL AMPUTATION Right 06/21/2023   Procedure: TRANSMETATARSAL AMPUTATION;  Surgeon: Pink Bridges, DPM;  Location: [facility];  Service: Orthopedics/Podiatry;  Laterality: Right;     Social History   Tobacco Use   Smoking status: Former    Current packs/day: 0.00    Average packs/day: 1 pack/day for 40.5 years (40.5 ttl pk-yrs)    Types: Cigarettes    Start date: 47    Quit date: 04/16/2023    Years since quitting: 0.8   Smokeless tobacco: Never   Tobacco comments:  Last smoked 3-4 days ago  Vaping Use   Vaping status: Never Used  Substance Use Topics   Alcohol use: Yes    Alcohol/week: 5.0 standard drinks of alcohol    Types: 5 Cans of beer per week    Comment: 4-5 beers a week   Drug use: Yes    Frequency: 7.0 times per week    Types: Marijuana    Comment: a joint daily      Family History  Problem Relation Age of Onset   Leukemia Mother    Heart disease Father        a. CABG   Heart disease Sister        a. MVR   Cancer Sister    Cancer Brother    CAD Brother        a. CABG     No Known Allergies   REVIEW OF SYSTEMS (Negative unless checked)  Constitutional: [] Weight loss  [] Fever  [] Chills Cardiac: [] Chest pain   [] Chest pressure   [] Palpitations   [] Shortness of breath when laying flat   [] Shortness of breath at rest   [] Shortness of breath with exertion. Vascular:  [] Pain in legs with walking   [] Pain in legs at rest   [] Pain in legs when laying flat   [] Claudication   [] Pain in feet when walking  [] Pain in feet at rest  [] Pain in feet when laying flat   [] History of DVT   [] Phlebitis   [] Swelling  in legs   [] Varicose veins   [x] Non-healing ulcers Pulmonary:   [] Uses home oxygen   [] Productive cough   [] Hemoptysis   [] Wheeze  [] COPD   [] Asthma Neurologic:  [] Dizziness  [] Blackouts   [] Seizures   [] History of stroke   [] History of TIA  [] Aphasia   [] Temporary blindness   [] Dysphagia   [] Weakness or numbness in arms   [] Weakness or numbness in legs Musculoskeletal:  [] Arthritis   [] Joint swelling   [x] Joint pain   [] Low back pain Hematologic:  [] Easy bruising  [] Easy bleeding   [] Hypercoagulable state   [] Anemic   Gastrointestinal:  [] Blood in stool   [] Vomiting blood  [] Gastroesophageal reflux/heartburn   [] Abdominal pain Genitourinary:  [] Chronic kidney disease   [] Difficult urination  [] Frequent urination  [] Burning with urination   [] Hematuria Skin:  [] Rashes   [x] Ulcers   [x] Wounds Psychological:  [x] History of anxiety   []  History of major depression.  Physical Examination  BP 115/80   Pulse 65   Resp 18  Gen:  WD/WN, NAD Head: /AT, No temporalis wasting. Ear/Nose/Throat: Hearing grossly intact, nares w/o erythema or drainage Eyes: Conjunctiva clear. Sclera non-icteric Neck: Supple.  Trachea midline Pulmonary:  Good air movement, no use of accessory muscles.  Cardiac: RRR, no JVD Vascular:  Vessel Right Left  Radial Palpable Palpable                          PT Not Palpable 1+ Palpable  DP Not Palpable Not Palpable   Musculoskeletal: M/S 5/5 throughout.  No deformity or atrophy. Right AKA with a wound that is down to 3-4 cm long and about a cm wide.  Good granulation. Left leg with sluggish capillary refill but warm. No edema. Neurologic: Sensation grossly intact in extremities.  Symmetrical.  Speech is fluent.  Psychiatric: Judgment intact, Mood & affect appropriate for pt's clinical situation. Dermatologic: Right AKA wound as above      Labs Recent Results (from the  past 2160 hours)  Aerobic culture     Status: Abnormal   Collection Time: 11/20/23  2:47 PM   Result Value Ref Range   Aerobic Bacterial Culture Final report (A)    Organism ID, Bacteria Escherichia coli (A)     Comment: Multi-Drug Resistant Organism Heavy growth    Organism ID, Bacteria Comment (A)     Comment: Pseudomonas aeruginosa Ceftazidime-avibactam and ceftolozane-tazobactam may be considered for therapy ONLY when multi-drug resistance (MDR) is demonstrated to meropenem  and other tested agents. Heavy growth    Organism ID, Bacteria Routine flora     Comment: Heavy growth   Antimicrobial Susceptibility Comment     Comment:       ** S = Susceptible; I = Intermediate; R = Resistant **                    P = Positive; N = Negative             MICS are expressed in micrograms per mL    Antibiotic                 RSLT#1    RSLT#2    RSLT#3    RSLT#4 Amoxicillin /Clavulanic Acid    R Ampicillin                      R Cefazolin                       R Cefepime                        S         S Cefoxitin                      R Cefpodoxime                    R Ceftazidime/avibactam                    S Ceftolozane/tazobactam                   S Ceftriaxone                     R Ciprofloxacin                   S         S Ertapenem                      S Gentamicin                     S Levofloxacin                   R         S Meropenem                       S         S Piperacillin /Tazobactam        S         S Tetracycline                   S Tobramycin                     S  S Trimethoprim /Sulfa              S   CBC     Status: Abnormal   Collection Time: 12/26/23 12:31 PM  Result Value Ref Range   WBC 9.2 4.0 - 10.5 K/uL   RBC 4.23 4.22 - 5.81 MIL/uL   Hemoglobin 12.2 (L) 13.0 - 17.0 g/dL   HCT 82.9 (L) 56.2 - 13.0 %   MCV 87.7 80.0 - 100.0 fL   MCH 28.8 26.0 - 34.0 pg   MCHC 32.9 30.0 - 36.0 g/dL   RDW 86.5 78.4 - 69.6 %   Platelets 324 150 - 400 K/uL   nRBC 0.0 0.0 - 0.2 %    Comment: Performed at [facility], [address]  [place], [place] [address]  Comprehensive metabolic panel     Status: Abnormal   Collection Time: 12/26/23 12:31 PM  Result Value Ref Range   Sodium 137 135 - 145 mmol/L   Potassium 4.6 3.5 - 5.1 mmol/L    Comment: HEMOLYSIS AT THIS LEVEL MAY AFFECT RESULT   Chloride 102 98 - 111 mmol/L   CO2 23 22 - 32 mmol/L   Glucose, Bld 87 70 - 99 mg/dL    Comment: Glucose reference range applies only to samples taken after fasting for at least 8 hours.   BUN 10 8 - 23 mg/dL   Creatinine, Ser 4.13 0.61 - 1.24 mg/dL   Calcium  9.1 8.9 - 10.3 mg/dL   Total Protein 7.3 6.5 - 8.1 g/dL   Albumin  3.5 3.5 - 5.0 g/dL   AST 29 15 - 41 U/L    Comment: HEMOLYSIS AT THIS LEVEL MAY AFFECT RESULT   ALT 13 0 - 44 U/L    Comment: HEMOLYSIS AT THIS LEVEL MAY AFFECT RESULT   Alkaline Phosphatase 73 38 - 126 U/L   Total Bilirubin 1.9 (H) 0.0 - 1.2 mg/dL    Comment: HEMOLYSIS AT THIS LEVEL MAY AFFECT RESULT   GFR, Estimated >60 >60 mL/min    Comment: (NOTE) Calculated using the CKD-EPI Creatinine Equation (2021)    Anion gap 12 5 - 15    Comment: Performed at [facility], [address] [place], [place] [address]  Heparin  level (unfractionated)     Status: None   Collection Time: 12/26/23 12:31 PM  Result Value Ref Range   Heparin  Unfractionated 0.44 0.30 - 0.70 IU/mL    Comment: (NOTE) The clinical reportable range upper limit is being lowered to >1.10 to align with the FDA approved guidance for the current laboratory assay.  If heparin  results are below expected values, and patient dosage has  been confirmed, suggest follow up testing of antithrombin III levels. Performed at [facility], [address] [place], [place] [address]   APTT     Status: None   Collection Time: 12/26/23 12:31 PM  Result Value Ref Range   aPTT 34 24 - 36 seconds    Comment: Performed at [facility], [address] [place], [place] [address]  Protime-INR     Status: None   Collection Time:  12/26/23 12:59 PM  Result Value Ref Range   Prothrombin Time 13.4 11.4 - 15.2 seconds   INR 1.0 0.8 - 1.2    Comment: (NOTE) INR goal varies based on device and disease states. Performed at [facility], [address] [place], [place] [address]   APTT     Status: None   Collection Time: 12/26/23 12:59 PM  Result Value Ref Range   aPTT 35 24 - 36 seconds    Comment: Performed at [facility], [address] [place], [place] [address]  MRSA Next Gen by PCR, Nasal     Status: None   Collection Time: 12/26/23  2:48 PM   Specimen: Nasal Mucosa; Nasal Swab  Result Value Ref Range   MRSA by PCR Next Gen NOT DETECTED NOT DETECTED    Comment: (NOTE) The GeneXpert MRSA Assay (FDA approved for NASAL specimens only), is one component of a comprehensive MRSA colonization surveillance program. It is not intended to diagnose MRSA infection nor to guide or monitor treatment for MRSA infections. Test performance is not FDA approved in patients less than 81 years old. Performed at [facility], [address] [place], [place] [address]   Heparin  level (unfractionated)     Status: None   Collection Time: 12/26/23  7:22 PM  Result Value Ref Range   Heparin  Unfractionated 0.48 0.30 - 0.70 IU/mL    Comment: (NOTE) The clinical reportable range upper limit is being lowered to >1.10 to align with the FDA approved guidance for the current laboratory assay.  If heparin  results are below expected values, and patient dosage has  been confirmed, suggest follow up testing of antithrombin III levels. Performed at [facility], [address] [place], [place] [address]   Heparin  level (unfractionated)     Status: None   Collection Time: 12/27/23  7:30 AM  Result Value Ref Range   Heparin  Unfractionated 0.34 0.30 - 0.70 IU/mL    Comment: (NOTE) The clinical reportable range upper limit is being lowered to >1.10 to align with the FDA approved guidance for  the current laboratory assay.  If heparin  results are below expected values, and patient dosage has  been confirmed, suggest follow up testing of antithrombin III levels. Performed at [facility], [address] [place], [place] [address]   CBC     Status: Abnormal   Collection Time: 12/27/23  9:12 AM  Result Value Ref Range   WBC 7.9 4.0 - 10.5 K/uL   RBC 3.53 (L) 4.22 - 5.81 MIL/uL   Hemoglobin 10.5 (L) 13.0 - 17.0 g/dL   HCT 29.5 (L) 62.1 - 30.8 %   MCV 90.1 80.0 - 100.0 fL   MCH 29.7 26.0 - 34.0 pg   MCHC 33.0 30.0 - 36.0 g/dL   RDW 65.7 84.6 - 96.2 %   Platelets 272 150 - 400 K/uL   nRBC 0.0 0.0 - 0.2 %    Comment: Performed at [facility], [address] [place], [place] [address]  Basic metabolic panel     Status: Abnormal   Collection Time: 12/27/23  4:40 PM  Result Value Ref Range   Sodium 139 135 - 145 mmol/L   Potassium 3.7 3.5 - 5.1 mmol/L   Chloride 107 98 - 111 mmol/L   CO2 21 (L) 22 - 32 mmol/L   Glucose, Bld 181 (H) 70 - 99 mg/dL    Comment: Glucose reference range applies only to samples taken after fasting for at least 8 hours.   BUN 10 8 - 23 mg/dL   Creatinine, Ser 1.32 0.61 - 1.24 mg/dL   Calcium  8.7 (L) 8.9 - 10.3 mg/dL   GFR, Estimated >44 >01 mL/min    Comment: (NOTE) Calculated using the CKD-EPI Creatinine Equation (2021)    Anion gap 11 5 - 15    Comment: Performed at [facility], [address] [place], [place] [address]  Heparin  level (unfractionated)     Status: None   Collection Time: 12/27/23  7:09 PM  Result Value Ref Range   Heparin  Unfractionated 0.44 0.30 - 0.70 IU/mL    Comment: (NOTE) The clinical reportable range upper limit is being lowered to >1.10 to align with the FDA approved guidance for the current laboratory assay.  If heparin  results are below expected values, and patient dosage has  been confirmed, suggest follow up testing of antithrombin III levels. Performed at [facility], [address] [place], [place] [address]   Basic metabolic panel     Status: Abnormal   Collection Time: 12/28/23  1:08 AM  Result Value Ref Range   Sodium 136 135 - 145 mmol/L   Potassium 3.7 3.5 - 5.1 mmol/L   Chloride 104 98 - 111 mmol/L   CO2 21 (L) 22 - 32 mmol/L   Glucose, Bld 233 (H) 70 - 99 mg/dL    Comment: Glucose reference range applies only to samples taken after fasting for at least 8 hours.   BUN 13 8 - 23 mg/dL   Creatinine, Ser 1.47 0.61 - 1.24 mg/dL   Calcium  8.3 (L) 8.9 - 10.3 mg/dL   GFR, Estimated >82 >95 mL/min    Comment: (NOTE) Calculated using the CKD-EPI Creatinine Equation (2021)    Anion gap 11 5 - 15    Comment: Performed at [facility], [address] [place], [place] [address]  CBC     Status: Abnormal   Collection Time: 12/28/23  1:08 AM  Result Value Ref Range   WBC 10.4 4.0 - 10.5 K/uL   RBC 2.95 (L) 4.22 - 5.81 MIL/uL   Hemoglobin 8.5 (L) 13.0 - 17.0 g/dL   HCT 86.5 (L) 78.4 - 69.6 %   MCV 89.8 80.0 - 100.0 fL   MCH 28.8 26.0 - 34.0 pg   MCHC 32.1 30.0 - 36.0 g/dL   RDW 29.5 28.4 - 13.2 %   Platelets 232 150 - 400 K/uL   nRBC 0.0 0.0 - 0.2 %    Comment: Performed at [facility], [address], [place], [place] [address]  Magnesium      Status: None   Collection Time: 12/28/23  1:08 AM  Result Value Ref Range   Magnesium  1.7 1.7 - 2.4 mg/dL    Comment: Performed at [facility], [address], [place], [place] [address]  Phosphorus     Status: None   Collection Time: 12/28/23  1:08 AM  Result Value Ref Range   Phosphorus 2.8 2.5 - 4.6 mg/dL    Comment: Performed at [facility], [address] [place], [place] [address]  Heparin  level (unfractionated)     Status: None   Collection Time: 12/28/23  1:08 AM  Result Value Ref Range   Heparin  Unfractionated 0.41 0.30 - 0.70 IU/mL    Comment: (NOTE) The clinical reportable range upper limit is being lowered to >1.10 to align with the FDA  approved guidance for the current laboratory assay.  If heparin  results are below expected values, and patient dosage has  been confirmed, suggest follow up testing of antithrombin III levels. Performed at [facility], [address] [place], [place] [address]   Iron  and TIBC     Status: Abnormal   Collection Time: 12/28/23  1:08 AM  Result Value Ref Range   Iron  23 (L) 45 - 182 ug/dL  TIBC 220 (L) 250 - 450 ug/dL   Saturation Ratios 11 (L) 17.9 - 39.5 %   UIBC 197 ug/dL    Comment: Performed at [facility], [address] [place], [place] [address]  Folate     Status: None   Collection Time: 12/28/23  1:08 AM  Result Value Ref Range   Folate 15.6 >5.9 ng/mL    Comment: Performed at [facility], [address] [place], [place] [address]  Vitamin B12     Status: None   Collection Time: 12/28/23 10:47 AM  Result Value Ref Range   Vitamin B-12 207 180 - 914 pg/mL    Comment: (NOTE) This assay is not validated for testing neonatal or myeloproliferative syndrome specimens for Vitamin B12 levels. Performed at [facility], [address] [place], [place] [address]   VITAMIN D  25 Hydroxy (Vit-D Deficiency, Fractures)     Status: Abnormal   Collection Time: 12/28/23 10:47 AM  Result Value Ref Range   Vit D, 25-Hydroxy 15.06 (L) 30 - 100 ng/mL    Comment: (NOTE) Vitamin D  deficiency has been defined by the Institute of Medicine  and an Endocrine Society practice guideline as a level of serum 25-OH  vitamin D  less than 20 ng/mL (1,2). The Endocrine Society went on to  further define vitamin D  insufficiency as a level between 21 and 29  ng/mL (2).  1. IOM (Institute of Medicine). 2010. Dietary reference intakes for  calcium  and D. [place]: [employer]. 2. Holick MF, Binkley [place], Bischoff-Ferrari HA, et al. Evaluation,  treatment, and prevention of vitamin D  deficiency: an Endocrine  Society clinical practice  guideline, JCEM. 2011 Jul; 96(7): 1911-30.  Performed at [facility], [address] [place], [place] [address]   Basic metabolic panel     Status: Abnormal   Collection Time: 12/29/23  2:27 AM  Result Value Ref Range   Sodium 136 135 - 145 mmol/L   Potassium 3.8 3.5 - 5.1 mmol/L   Chloride 105 98 - 111 mmol/L   CO2 23 22 - 32 mmol/L   Glucose, Bld 106 (H) 70 - 99 mg/dL    Comment: Glucose reference range applies only to samples taken after fasting for at least 8 hours.   BUN 11 8 - 23 mg/dL   Creatinine, Ser 2.95 0.61 - 1.24 mg/dL   Calcium  8.3 (L) 8.9 - 10.3 mg/dL   GFR, Estimated >62 >13 mL/min    Comment: (NOTE) Calculated using the CKD-EPI Creatinine Equation (2021)    Anion gap 8 5 - 15    Comment: Performed at [facility], [address] [place], [place] [address]  CBC     Status: Abnormal   Collection Time: 12/29/23  2:27 AM  Result Value Ref Range   WBC 10.2 4.0 - 10.5 K/uL   RBC 2.81 (L) 4.22 - 5.81 MIL/uL   Hemoglobin 8.2 (L) 13.0 - 17.0 g/dL   HCT 84.6 (L) 96.2 - 95.2 %   MCV 89.7 80.0 - 100.0 fL   MCH 29.2 26.0 - 34.0 pg   MCHC 32.5 30.0 - 36.0 g/dL   RDW 84.1 32.4 - 40.1 %   Platelets 253 150 - 400 K/uL   nRBC 0.0 0.0 - 0.2 %    Comment: Performed at [facility], [address] [place], [place] [address]  Magnesium      Status: None   Collection Time: 12/29/23  2:27 AM  Result Value Ref Range   Magnesium  2.0 1.7 - 2.4 mg/dL    Comment: Performed at [facility], [address] [place], [place] [address]  Phosphorus     Status: Abnormal   Collection Time: 12/29/23  2:27 AM  Result Value Ref Range   Phosphorus 2.4 (L) 2.5 - 4.6 mg/dL    Comment: Performed at [facility], [address] [place], [place] [address]  Heparin  level (unfractionated)     Status: Abnormal   Collection Time: 12/29/23  2:27 AM  Result Value Ref Range   Heparin  Unfractionated 0.24 (L) 0.30 - 0.70 IU/mL    Comment: (NOTE) The  clinical reportable range upper limit is being lowered to >1.10 to align with the FDA approved guidance for the current laboratory assay.  If heparin  results are below expected values, and patient dosage has  been confirmed, suggest follow up testing of antithrombin III levels. Performed at [facility], [address] [place], [place] [id number]   Heparin  level (unfractionated)     Status: Abnormal   Collection Time: 12/29/23 10:52 AM  Result Value Ref Range   Heparin  Unfractionated 0.14 (L) 0.30 - 0.70 IU/mL    Comment: (NOTE) The clinical reportable range upper limit is being lowered to >1.10 to align with the FDA approved guidance for the current laboratory assay.  If heparin  results are below expected values, and patient dosage has  been confirmed, suggest follow up testing of antithrombin III levels. Performed at [facility], [address] [place], [place] [address]   Heparin  level (unfractionated)     Status: None   Collection Time: 12/29/23  6:45 PM  Result Value Ref Range   Heparin  Unfractionated 0.41 0.30 - 0.70 IU/mL    Comment: (NOTE) The clinical reportable range upper limit is being lowered to >1.10 to align with the FDA approved guidance for the current laboratory assay.  If heparin  results are below expected values, and patient dosage has  been confirmed, suggest follow up testing of antithrombin III levels. Performed at [facility], [address] [place], [place] [address]   Heparin  level (unfractionated)     Status: Abnormal   Collection Time: 12/30/23 12:47 AM  Result Value Ref Range   Heparin  Unfractionated <0.10 (L) 0.30 - 0.70 IU/mL    Comment: (NOTE) The clinical reportable range upper limit is being lowered to >1.10 to align with the FDA approved guidance for the current laboratory assay.  If heparin  results are below expected values, and patient dosage has  been confirmed, suggest follow up testing of  antithrombin III levels. Performed at [facility], [address] [place], [place] [address]   Basic metabolic panel     Status: Abnormal   Collection Time: 12/30/23  4:51 AM  Result Value Ref Range   Sodium 138 135 - 145 mmol/L   Potassium 3.6 3.5 - 5.1 mmol/L   Chloride 105 98 - 111 mmol/L   CO2 25 22 - 32 mmol/L   Glucose, Bld 121 (H) 70 - 99 mg/dL    Comment: Glucose reference range applies only to samples taken after fasting for at least 8 hours.   BUN 6 (L) 8 - 23 mg/dL   Creatinine, Ser 5.95 0.61 - 1.24 mg/dL   Calcium  8.6 (L) 8.9 - 10.3 mg/dL   GFR, Estimated >63 >87 mL/min    Comment: (NOTE) Calculated using the CKD-EPI Creatinine Equation (2021)    Anion gap 8 5 - 15  Comment: Performed at [facility], [address] [place], [place] [address]  CBC     Status: Abnormal   Collection Time: 12/30/23  4:51 AM  Result Value Ref Range   WBC 9.4 4.0 - 10.5 K/uL   RBC 2.78 (L) 4.22 - 5.81 MIL/uL   Hemoglobin 8.0 (L) 13.0 - 17.0 g/dL   HCT 60.1 (L) 09.3 - 23.5 %   MCV 88.1 80.0 - 100.0 fL   MCH 28.8 26.0 - 34.0 pg   MCHC 32.7 30.0 - 36.0 g/dL   RDW 57.3 22.0 - 25.4 %   Platelets 261 150 - 400 K/uL   nRBC 0.0 0.0 - 0.2 %    Comment: Performed at [facility], [address], [place], [place] [address]  Magnesium      Status: None   Collection Time: 12/30/23  4:51 AM  Result Value Ref Range   Magnesium  1.8 1.7 - 2.4 mg/dL    Comment: Performed at [facility], [address] [place], [place] [address]  Phosphorus     Status: None   Collection Time: 12/30/23  4:51 AM  Result Value Ref Range   Phosphorus 3.5 2.5 - 4.6 mg/dL    Comment: Performed at [facility], [address] [place], [place] [address]  Heparin  level (unfractionated)     Status: None   Collection Time: 12/30/23  8:00 AM  Result Value Ref Range   Heparin  Unfractionated 0.39 0.30 - 0.70 IU/mL    Comment: (NOTE) The clinical reportable range upper  limit is being lowered to >1.10 to align with the FDA approved guidance for the current laboratory assay.  If heparin  results are below expected values, and patient dosage has  been confirmed, suggest follow up testing of antithrombin III levels. Performed at [facility], [address] [place], [place] [address]   Heparin  level (unfractionated)     Status: None   Collection Time: 12/30/23  1:34 PM  Result Value Ref Range   Heparin  Unfractionated 0.40 0.30 - 0.70 IU/mL    Comment: (NOTE) The clinical reportable range upper limit is being lowered to >1.10 to align with the FDA approved guidance for the current laboratory assay.  If heparin  results are below expected values, and patient dosage has  been confirmed, suggest follow up testing of antithrombin III levels. Performed at [facility], [address] [place], [place] [address]   Basic metabolic panel     Status: Abnormal   Collection Time: 12/31/23  2:20 AM  Result Value Ref Range   Sodium 138 135 - 145 mmol/L   Potassium 3.5 3.5 - 5.1 mmol/L   Chloride 106 98 - 111 mmol/L   CO2 25 22 - 32 mmol/L   Glucose, Bld 137 (H) 70 - 99 mg/dL    Comment: Glucose reference range applies only to samples taken after fasting for at least 8 hours.   BUN 6 (L) 8 - 23 mg/dL   Creatinine, Ser 2.69 (L) 0.61 - 1.24 mg/dL   Calcium  9.0 8.9 - 10.3 mg/dL   GFR, Estimated >48 >54 mL/min    Comment: (NOTE) Calculated using the CKD-EPI Creatinine Equation (2021)    Anion gap 7 5 - 15    Comment: Performed at [facility], [address] [place], [place] [address]  CBC     Status: Abnormal   Collection Time: 12/31/23  2:20 AM  Result Value Ref Range   WBC 9.1 4.0 - 10.5 K/uL  RBC 2.86 (L) 4.22 - 5.81 MIL/uL   Hemoglobin 8.2 (L) 13.0 - 17.0 g/dL   HCT 65.7 (L) 84.6 - 96.2 %   MCV 88.5 80.0 - 100.0 fL   MCH 28.7 26.0 - 34.0 pg   MCHC 32.4 30.0 - 36.0 g/dL   RDW 95.2 84.1 - 32.4 %   Platelets 281 150 -  400 K/uL   nRBC 0.0 0.0 - 0.2 %    Comment: Performed at [facility], [address] [place] [address], [place], [place] [address]  Magnesium      Status: None   Collection Time: 12/31/23  2:20 AM  Result Value Ref Range   Magnesium  2.0 1.7 - 2.4 mg/dL    Comment: Performed at [facility], [address] [place], [place] [address]  Phosphorus     Status: None   Collection Time: 12/31/23  2:20 AM  Result Value Ref Range   Phosphorus 3.6 2.5 - 4.6 mg/dL    Comment: Performed at [facility], [address] [place], [place] [address]  Heparin  level (unfractionated)     Status: Abnormal   Collection Time: 12/31/23  2:20 AM  Result Value Ref Range   Heparin  Unfractionated <0.10 (L) 0.30 - 0.70 IU/mL    Comment: (NOTE) The clinical reportable range upper limit is being lowered to >1.10 to align with the FDA approved guidance for the current laboratory assay.  If heparin  results are below expected values, and patient dosage has  been confirmed, suggest follow up testing of antithrombin III levels. Performed at [facility], [address] [place], [place] [address]   Heparin  level (unfractionated)     Status: None   Collection Time: 12/31/23  6:09 AM  Result Value Ref Range   Heparin  Unfractionated 0.30 0.30 - 0.70 IU/mL    Comment: (NOTE) The clinical reportable range upper limit is being lowered to >1.10 to align with the FDA approved guidance for the current laboratory assay.  If heparin  results are below expected values, and patient dosage has  been confirmed, suggest follow up testing of antithrombin III levels. Performed at [facility], [address] [place], [place] [address]   Basic metabolic panel     Status: Abnormal   Collection Time: 01/01/24  3:59 AM  Result Value Ref Range   Sodium 137 135 - 145 mmol/L   Potassium 3.7 3.5 - 5.1 mmol/L   Chloride 104 98 - 111 mmol/L   CO2 26 22 - 32 mmol/L   Glucose, Bld 119 (H) 70 - 99  mg/dL    Comment: Glucose reference range applies only to samples taken after fasting for at least 8 hours.   BUN 7 (L) 8 - 23 mg/dL   Creatinine, Ser 7.56 0.61 - 1.24 mg/dL   Calcium  9.1 8.9 - 10.3 mg/dL   GFR, Estimated >43 >32 mL/min    Comment: (NOTE) Calculated using the CKD-EPI Creatinine Equation (2021)    Anion gap 7 5 - 15    Comment: Performed at [facility], [address] [place], [place] [address]  CBC     Status: Abnormal   Collection Time: 01/01/24  3:59 AM  Result Value Ref Range   WBC 9.3 4.0 - 10.5 K/uL   RBC 2.68 (L) 4.22 - 5.81 MIL/uL   Hemoglobin 7.8 (L) 13.0 - 17.0 g/dL   HCT 41.6 (L) 60.6 - 30.1 %   MCV 89.2 80.0 - 100.0 fL   MCH 29.1 26.0 - 34.0 pg  MCHC 32.6 30.0 - 36.0 g/dL   RDW 54.0 98.1 - 19.1 %   Platelets 325 150 - 400 K/uL   nRBC 0.0 0.0 - 0.2 %    Comment: Performed at [facility], [address] [place], [place] [address]  Magnesium      Status: None   Collection Time: 01/01/24  3:59 AM  Result Value Ref Range   Magnesium  2.0 1.7 - 2.4 mg/dL    Comment: Performed at [facility], [address] [place], [place] [address]  Phosphorus     Status: None   Collection Time: 01/01/24  3:59 AM  Result Value Ref Range   Phosphorus 3.1 2.5 - 4.6 mg/dL    Comment: Performed at [facility], [address] [place], [place] [address]  Heparin  level (unfractionated)     Status: None   Collection Time: 01/01/24  3:59 AM  Result Value Ref Range   Heparin  Unfractionated 0.33 0.30 - 0.70 IU/mL    Comment: (NOTE) The clinical reportable range upper limit is being lowered to >1.10 to align with the FDA approved guidance for the current laboratory assay.  If heparin  results are below expected values, and patient dosage has  been confirmed, suggest follow up testing of antithrombin III levels. Performed at [facility], [address] [place], [place] [address]   VAS US  ABI WITH/WO TBI     Status:  None   Collection Time: 02/06/24  1:26 PM  Result Value Ref Range   Right ABI AKA    Left ABI 0.72     Radiology VAS US  ABI WITH/WO TBI Result Date: 02/07/2024  LOWER EXTREMITY DOPPLER STUDY Patient Name:  Brendan Williams  Date of Exam:   02/06/2024 Medical Rec #: [id number]         Accession #:    [id number] Date of Birth: [birth year]          Patient Gender: M Patient Age:   88 years Exam Location:  [facility] Procedure:      VAS US  ABI WITH/WO TBI Referring Phys: Mikki Alexander --------------------------------------------------------------------------------  Indications: Claudication, rest pain, peripheral artery disease, and Severe pain              right leg. High Risk Factors: Hyperlipidemia, past history of smoking. Other Factors: Right AKA.  Vascular Interventions: 05/28/2023: right SFA atherectomy, stent, ATA PTA                          11/14/19: Bilateral CIA and EIA Stents.                          11/28/2019: Aorta, Bilateral CIA, Bilateral EIA and                         Bilateral SFA Stents with Right ATA Angioplasty.                         01/2022 rt thrombectomy and new stents sfa pop. Performing Technologist: Oneta Bilberry RVT  Examination Guidelines: A complete evaluation includes at minimum, Doppler waveform signals and systolic blood pressure reading at the level of bilateral brachial, anterior tibial, and posterior tibial arteries, when vessel segments are accessible. Bilateral testing is considered an integral part of a complete examination. Photoelectric Plethysmograph (PPG) waveforms and toe systolic pressure readings are included as required  and additional duplex testing as needed. Limited examinations for reoccurring indications may be performed as noted.  ABI Findings: +--------+------------------+-----+--------+--------+ Right   Rt Pressure (mmHg)IndexWaveformComment  +--------+------------------+-----+--------+--------+ [id number]                                      +--------+------------------+-----+--------+--------+ +---------+------------------+-----+----------+-------+ Left     Lt Pressure (mmHg)IndexWaveform  Comment +---------+------------------+-----+----------+-------+ Brachial 130                                      +---------+------------------+-----+----------+-------+ PTA      72                0.55 monophasic        +---------+------------------+-----+----------+-------+ DP       93                0.72 monophasic        +---------+------------------+-----+----------+-------+ Great Toe39                0.30                   +---------+------------------+-----+----------+-------+ +-------+-----------+-----------+------------+------------+ ABI/TBIToday's ABIToday's TBIPrevious ABIPrevious TBI +-------+-----------+-----------+------------+------------+ Right  AKA                   0.00        amputation   +-------+-----------+-----------+------------+------------+ Left   0.72       0.30       0.74        0.29         +-------+-----------+-----------+------------+------------+  Left ABIs and TBIs appear essentially unchanged compared to prior study on 09/27/2023.  Summary: Left: Resting left ankle-brachial index indicates moderate left lower extremity arterial disease. The left toe-brachial index is abnormal. Limited imaging showed occluded SFA and patent mid to distal popliteal artery which fills via collaterals. *See table(s) above for measurements and observations.  Electronically signed by Mikki Alexander MD on 02/07/2024 at 10:15:27 AM.    Final     Assessment/Plan  Hyperlipidemia lipid control important in reducing the progression of atherosclerotic disease. Continue statin therapy   Non-healing wound of amputation stump (HCC) Continue VAC therapy.  Wound seems to be improving steadily.  Atherosclerosis of native arteries of extremity with rest pain (HCC) ABI recently was 0.72 on the left. He has a  known left SFA occlusion but his aorta/iliac intervention is patent.  This is probably as good as his perfusion can get on the left side with very limited options for his SFA disease at this point and no limb threatening symptoms.  Continue medicines. Recheck in 3-4 months.      Mikki Alexander, MD  02/11/2024 8:40 AM    This note was created with Dragon medical transcription system.  Any errors from dictation are purely unintentional

## 2024-02-11 NOTE — Assessment & Plan Note (Signed)
 lipid control important in reducing the progression of atherosclerotic disease. Continue statin therapy

## 2024-02-11 NOTE — Assessment & Plan Note (Signed)
 Continue VAC therapy.  Wound seems to be improving steadily.

## 2024-02-11 NOTE — Assessment & Plan Note (Signed)
 ABI recently was 0.72 on the left. He has a known left SFA occlusion but his aorta/iliac intervention is patent.  This is probably as good as his perfusion can get on the left side with very limited options for his SFA disease at this point and no limb threatening symptoms.  Continue medicines. Recheck in 3-4 months.

## 2024-02-13 ENCOUNTER — Telehealth (INDEPENDENT_AMBULATORY_CARE_PROVIDER_SITE_OTHER): Payer: Self-pay

## 2024-02-13 ENCOUNTER — Other Ambulatory Visit (INDEPENDENT_AMBULATORY_CARE_PROVIDER_SITE_OTHER): Payer: Self-pay | Admitting: Nurse Practitioner

## 2024-02-13 MED ORDER — OXYCODONE-ACETAMINOPHEN 7.5-325 MG PO TABS: 1.0000 | ORAL_TABLET | ORAL | 0 refills | Status: DC | PRN

## 2024-02-13 NOTE — Telephone Encounter (Signed)
 Patient called for a refill on Oxycodone  7.5 mg sent to [facility] in [place]

## 2024-02-13 NOTE — Telephone Encounter (Signed)
 sent

## 2024-02-15 ENCOUNTER — Encounter (INDEPENDENT_AMBULATORY_CARE_PROVIDER_SITE_OTHER): Payer: Self-pay

## 2024-02-15 ENCOUNTER — Ambulatory Visit (INDEPENDENT_AMBULATORY_CARE_PROVIDER_SITE_OTHER): Admitting: Nurse Practitioner

## 2024-02-15 VITALS — BP 110/75 | HR 63 | Resp 16

## 2024-02-15 DIAGNOSIS — T8789 Other complications of amputation stump: Secondary | ICD-10-CM

## 2024-02-15 NOTE — Progress Notes (Unsigned)
 Patient was seen today for wound vac change with right above knee amputation. Patient wound has decreased in size. Patient wound was clean and vac was replaced. Patient will return in next week wound vac change.

## 2024-02-18 ENCOUNTER — Encounter (INDEPENDENT_AMBULATORY_CARE_PROVIDER_SITE_OTHER): Payer: Self-pay | Admitting: Nurse Practitioner

## 2024-02-19 ENCOUNTER — Ambulatory Visit (INDEPENDENT_AMBULATORY_CARE_PROVIDER_SITE_OTHER): Admitting: Nurse Practitioner

## 2024-02-19 ENCOUNTER — Encounter (INDEPENDENT_AMBULATORY_CARE_PROVIDER_SITE_OTHER): Payer: Self-pay | Admitting: Nurse Practitioner

## 2024-02-19 VITALS — BP 92/62 | HR 67 | Resp 18

## 2024-02-19 DIAGNOSIS — T8789 Other complications of amputation stump: Secondary | ICD-10-CM

## 2024-02-19 NOTE — Progress Notes (Signed)
 Wound vc change

## 2024-02-21 ENCOUNTER — Other Ambulatory Visit (INDEPENDENT_AMBULATORY_CARE_PROVIDER_SITE_OTHER): Payer: Self-pay | Admitting: Nurse Practitioner

## 2024-02-21 ENCOUNTER — Telehealth (INDEPENDENT_AMBULATORY_CARE_PROVIDER_SITE_OTHER): Payer: Self-pay

## 2024-02-21 MED ORDER — OXYCODONE-ACETAMINOPHEN 7.5-325 MG PO TABS: 1.0000 | ORAL_TABLET | ORAL | 0 refills | Status: DC | PRN

## 2024-02-21 NOTE — Telephone Encounter (Signed)
 Left message

## 2024-02-21 NOTE — Telephone Encounter (Signed)
 Brendan Williams called requesting a refill of 7.5 oxy   Please advise

## 2024-02-21 NOTE — Telephone Encounter (Signed)
 sent

## 2024-02-22 ENCOUNTER — Ambulatory Visit (INDEPENDENT_AMBULATORY_CARE_PROVIDER_SITE_OTHER)

## 2024-02-26 ENCOUNTER — Ambulatory Visit (INDEPENDENT_AMBULATORY_CARE_PROVIDER_SITE_OTHER)

## 2024-02-26 VITALS — BP 127/86 | HR 54 | Resp 18

## 2024-02-26 DIAGNOSIS — T8789 Other complications of amputation stump: Secondary | ICD-10-CM

## 2024-02-26 NOTE — Progress Notes (Unsigned)
 Pt was seen in office today for a wound check.  Wound Vac was discontinued today per Sharla Davis NP.  Aquacel and gauze was placed over wound and wrapped with curlex.  Pt was advised to change the bandage every 2 days and replace with Aquacel and gauze.  Pts family member states they will keep the bandage changed.  Pt ask could he now bathe daily and I let him know he could as long as he changes the bandage daily.  He states understanding.  He will follow up with us  in office on Mar 14 2024.

## 2024-02-28 ENCOUNTER — Telehealth (INDEPENDENT_AMBULATORY_CARE_PROVIDER_SITE_OTHER): Payer: Self-pay

## 2024-02-28 ENCOUNTER — Encounter (INDEPENDENT_AMBULATORY_CARE_PROVIDER_SITE_OTHER): Payer: Self-pay | Admitting: Nurse Practitioner

## 2024-02-28 NOTE — Telephone Encounter (Signed)
 Pt was called.

## 2024-02-29 ENCOUNTER — Other Ambulatory Visit (INDEPENDENT_AMBULATORY_CARE_PROVIDER_SITE_OTHER): Payer: Self-pay | Admitting: Nurse Practitioner

## 2024-02-29 ENCOUNTER — Ambulatory Visit (INDEPENDENT_AMBULATORY_CARE_PROVIDER_SITE_OTHER)

## 2024-02-29 MED ORDER — OXYCODONE-ACETAMINOPHEN 7.5-325 MG PO TABS: 1.0000 | ORAL_TABLET | ORAL | 0 refills | Status: DC | PRN

## 2024-02-29 NOTE — Telephone Encounter (Signed)
 I will refill his oxycodone  but as I have stated repeatedly, the Dexilant  needs to be refilled by his PCP.  This was sent one-time as a courtesy because he was not able to see his PCP.

## 2024-03-04 ENCOUNTER — Encounter (INDEPENDENT_AMBULATORY_CARE_PROVIDER_SITE_OTHER): Payer: Self-pay

## 2024-03-07 ENCOUNTER — Ambulatory Visit (INDEPENDENT_AMBULATORY_CARE_PROVIDER_SITE_OTHER)

## 2024-03-11 ENCOUNTER — Ambulatory Visit (INDEPENDENT_AMBULATORY_CARE_PROVIDER_SITE_OTHER): Admitting: Nurse Practitioner

## 2024-03-11 ENCOUNTER — Other Ambulatory Visit (INDEPENDENT_AMBULATORY_CARE_PROVIDER_SITE_OTHER): Payer: Self-pay | Admitting: Nurse Practitioner

## 2024-03-11 VITALS — BP 129/97 | HR 83 | Resp 17

## 2024-03-11 DIAGNOSIS — T8789 Other complications of amputation stump: Secondary | ICD-10-CM

## 2024-03-12 ENCOUNTER — Encounter (INDEPENDENT_AMBULATORY_CARE_PROVIDER_SITE_OTHER): Payer: Self-pay | Admitting: Nurse Practitioner

## 2024-03-12 ENCOUNTER — Telehealth (INDEPENDENT_AMBULATORY_CARE_PROVIDER_SITE_OTHER): Payer: Self-pay

## 2024-03-12 ENCOUNTER — Other Ambulatory Visit (INDEPENDENT_AMBULATORY_CARE_PROVIDER_SITE_OTHER): Payer: Self-pay | Admitting: Nurse Practitioner

## 2024-03-12 MED ORDER — OXYCODONE-ACETAMINOPHEN 7.5-325 MG PO TABS: 1.0000 | ORAL_TABLET | ORAL | 0 refills | Status: AC | PRN

## 2024-03-12 NOTE — Progress Notes (Signed)
 Subjective:    Patient ID: Brendan Williams, male    DOB: [birth year], 63 y.o.   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  His wound was recently debrided and revised after falling and opening the wound.  He had a wound VAC placement done at the time.  He has made a significant improvement since initially happened.  The wound VAC is now been removed.  It is still sensitive for me he has some irritation from the tape around the wound itself.    Review of Systems  Skin:  Positive for wound.  All other systems reviewed and are negative.      Objective:    Physical Exam Vitals reviewed.  HENT:     Head: Normocephalic.  Cardiovascular:     Rate and Rhythm: Normal rate.  Pulmonary:     Effort: Pulmonary effort is normal.  Musculoskeletal:     Right Lower Extremity: Right leg is amputated above knee.  Skin:    General: Skin is warm and dry.  Neurological:     Mental Status: He is alert and oriented to person, place, and time.     Motor: Weakness present.     Gait: Gait abnormal.  Psychiatric:        Mood and Affect: Mood normal.        Behavior: Behavior normal.        Thought Content: Thought content normal.        Judgment: Judgment normal.     BP (!) 129/97 (BP Location: Left Arm, Patient Position: Sitting, Cuff Size: Normal)   Pulse 83   Resp 17   Past Medical History:  Diagnosis Date   Anxiety    situational   Asthma    as a child   Atherosclerosis of native arteries of extremity with intermittent claudication (HCC)    Cellulitis and abscess of toe of right foot    Critical limb ischemia of right lower extremity (HCC)    Current smoker    Quit 04/16/23   Peripheral arterial disease (HCC)     Social History   Socioeconomic History   Marital status: Married    Spouse name: Tax,BRENDA (Spouse)   Number of children: Not on  file   Years of education: Not on file   Highest education level: Not on file  Occupational History   Occupation: gutter work  Tobacco Use   Smoking status: Former    Current packs/day: 0.00    Average packs/day: 1 pack/day for 40.5 years (40.5 ttl pk-yrs)    Types: Cigarettes    Start date: 44    Quit date: 04/16/2023    Years since quitting: 0.9   Smokeless tobacco: Never   Tobacco comments:    Last smoked 3-4 days ago  Vaping Use   Vaping status: Never Used  Substance and Sexual Activity   Alcohol use: Yes    Alcohol/week: 5.0 standard drinks of alcohol    Types: 5 Cans of beer per week    Comment: 4-5 beers a week   Drug use: Yes    Frequency: 7.0 times per week    Types: Marijuana    Comment: a joint daily   Sexual activity: Yes  Other Topics Concern   Not on file  Social History Narrative   Live in private residence with wife, Brendan Williams.  Had grandson and step daughter in the home   Social Drivers of Health   Financial Resource Strain: Not on file  Food Insecurity: Patient Declined (12/27/2023)   Hunger Vital Sign    Worried About Running Out of Food in the Last Year: Patient declined    Ran Out of Food in the Last Year: Patient declined  Transportation Needs: Patient Declined (12/27/2023)   PRAPARE - Administrator, Civil Service (Medical): Patient declined    Lack of Transportation (Non-Medical): Patient declined  Physical Activity: Not on file  Stress: Not on file  Social Connections: Not on file  Intimate Partner Violence: Patient Declined (12/27/2023)   Humiliation, Afraid, Rape, and Kick questionnaire    Fear of Current or Ex-Partner: Patient declined    Emotionally Abused: Patient declined    Physically Abused: Patient declined    Sexually Abused: Patient declined    Past Surgical History:  Procedure Laterality Date   AMPUTATION Right 10/12/2023   Procedure: AMPUTATION ABOVE KNEE;  Surgeon: Celso College, MD;  Location: [facility];  Service:  General;  Laterality: Right;   AMPUTATION TOE Right 12/02/2019   Procedure: AMPUTATION RIGHT 5TH TOE;  Surgeon: Pink Bridges, DPM;  Location: [facility];  Service: Podiatry;  Laterality: Right;   APPLICATION OF WOUND VAC Bilateral 11/28/2019   Procedure: APPLICATION OF WOUND VAC;  Surgeon: Celso College, MD;  Location: [facility];  Service: Vascular;  Laterality: Bilateral;  Prevena    APPLICATION OF WOUND VAC Right 12/31/2023   Procedure: APPLICATION, WOUND VAC;  Surgeon: Celso College, MD;  Location: [facility];  Service: Vascular;  Laterality: Right;   BACK SURGERY     lower ruptured disk   CENTRAL VENOUS CATHETER INSERTION  11/28/2019   Procedure: INSERTION CENTRAL LINE ADULT;  Surgeon: Celso College, MD;  Location: [facility];  Service: Vascular;;   ENDARTERECTOMY FEMORAL Bilateral 11/28/2019   Procedure: ENDARTERECTOMY FEMORAL;  Surgeon: Celso College, MD;  Location: [facility];  Service: Vascular;  Laterality: Bilateral;   ENDARTERECTOMY FEMORAL Right 05/23/2023   Procedure: ENDARTERECTOMY FEMORAL REDO;  Surgeon: Celso College, MD;  Location: [facility];  Service: Vascular;  Laterality: Right;   ENDOVASCULAR REPAIR/STENT GRAFT Right 05/23/2023   Procedure: ENDOVASCULAR REPAIR/STENT GRAFT;  Surgeon: Celso College, MD;  Location: [facility];  Service: Vascular;  Laterality: Right;  Right SFA Stents   INCISION AND DRAINAGE OF WOUND Right 12/31/2023   Procedure: IRRIGATION AND DEBRIDEMENT WOUND;  Surgeon: Celso College, MD;  Location: [facility];  Service: Vascular;  Laterality: Right;  RIGHT AKA STUMP WOUND   LOWER EXTREMITY ANGIOGRAPHY Right 11/14/2019   Procedure: LOWER EXTREMITY ANGIOGRAPHY;  Surgeon: Celso College, MD;  Location: [facility];  Service: Cardiovascular;  Laterality: Right;   LOWER EXTREMITY ANGIOGRAPHY Right 12/19/2021   Procedure: Lower Extremity Angiography;  Surgeon: Celso College, MD;  Location: [facility];  Service: Cardiovascular;  Laterality: Right;   LOWER EXTREMITY  ANGIOGRAPHY Right 01/30/2022   Procedure: Lower Extremity Angiography;  Surgeon: Celso College, MD;  Location: [facility];  Service: Cardiovascular;  Laterality: Right;   LOWER EXTREMITY ANGIOGRAPHY Right 05/21/2023   Procedure: Lower Extremity Angiography;  Surgeon: Celso College, MD;  Location: [facility];  Service: Cardiovascular;  Laterality: Right;   LOWER EXTREMITY ANGIOGRAPHY Right 09/28/2023   Procedure: Lower Extremity Angiography;  Surgeon: Celso College, MD;  Location: [facility];  Service: Cardiovascular;  Laterality: Right;   LOWER EXTREMITY ANGIOGRAPHY Left 12/27/2023   Procedure: Lower Extremity Angiography;  Surgeon: Celso College, MD;  Location: [facility];  Service: Cardiovascular;  Laterality: Left;   MECHANICAL THROMBECTOMY WITH AORTOGRAM AND INTERVENTION  05/23/2023   Procedure: MECHANICAL THROMBECTOMY right popliteal artery;  Surgeon: Celso College, MD;  Location: [facility];  Service: Vascular;;   STOMACH SURGERY     exploratory   TONSILECTOMY, ADENOIDECTOMY, BILATERAL MYRINGOTOMY AND TUBES     TONSILLECTOMY     TRANSMETATARSAL AMPUTATION Right 06/21/2023   Procedure: TRANSMETATARSAL AMPUTATION;  Surgeon: Pink Bridges, DPM;  Location: [facility];  Service: Orthopedics/Podiatry;  Laterality: Right;    Family History  Problem Relation Age of Onset   Leukemia Mother    Heart disease Father        a. CABG   Heart disease Sister        a. MVR   Cancer Sister    Cancer Brother    CAD Brother        a. CABG    No Known Allergies     Latest Ref Rng & Units 01/01/2024    3:59 AM 12/31/2023    2:20 AM 12/30/2023    4:51 AM  CBC  WBC 4.0 - 10.5 K/uL 9.3  9.1  9.4   Hemoglobin 13.0 - 17.0 g/dL 7.8  8.2  8.0   Hematocrit 39.0 - 52.0 % 23.9  25.3  24.5   Platelets 150 - 400 K/uL 325  281  261        CMP     Component Value Date/Time   NA 137 01/01/2024 0359   K 3.7 01/01/2024 0359   CL 104 01/01/2024 0359   CO2 26  01/01/2024 0359   GLUCOSE 119 (H) 01/01/2024 0359   BUN 7 (L) 01/01/2024 0359   CREATININE 0.68 01/01/2024 0359   CALCIUM  9.1 01/01/2024 0359   PROT 7.3 12/26/2023 1231   ALBUMIN  3.5 12/26/2023 1231   AST 29 12/26/2023 1231   ALT 13 12/26/2023 1231   ALKPHOS 73 12/26/2023 1231   BILITOT 1.9 (H) 12/26/2023 1231   GFRNONAA >60 01/01/2024 0359     VAS US  ABI WITH/WO TBI Result Date: 02/07/2024  LOWER EXTREMITY DOPPLER STUDY Patient Name:  TYAN DY  Date of Exam:   02/06/2024 Medical Rec #: [id number]         Accession #:    [id number] Date of Birth: Jul 24, 1961          Patient Gender: M Patient Age:   12 years Exam Location:  [facility] Procedure:      VAS US  ABI WITH/WO TBI Referring Phys: Reymundo Caulk DEW --------------------------------------------------------------------------------  Indications: Claudication, rest pain, peripheral artery disease, and Severe pain              right leg. High Risk Factors: Hyperlipidemia, past history of smoking. Other Factors: Right AKA.  Vascular Interventions: 05/28/2023: right SFA atherectomy, stent, ATA PTA                          11/14/19: Bilateral CIA and EIA Stents.                          11/28/2019: Aorta, Bilateral CIA, Bilateral EIA and  Bilateral SFA Stents with Right ATA Angioplasty.                         01/2022 rt thrombectomy and new stents sfa pop. Performing Technologist: Oneta Bilberry RVT  Examination Guidelines: A complete evaluation includes at minimum, Doppler waveform signals and systolic blood pressure reading at the level of bilateral brachial, anterior tibial, and posterior tibial arteries, when vessel segments are accessible. Bilateral testing is considered an integral part of a complete examination. Photoelectric Plethysmograph (PPG) waveforms and toe systolic pressure readings are included as required and additional duplex testing as needed. Limited examinations for reoccurring indications may be  performed as noted.  ABI Findings: +--------+------------------+-----+--------+--------+ Right   Rt Pressure (mmHg)IndexWaveformComment  +--------+------------------+-----+--------+--------+ [id number]                                     +--------+------------------+-----+--------+--------+ +---------+------------------+-----+----------+-------+ Left     Lt Pressure (mmHg)IndexWaveform  Comment +---------+------------------+-----+----------+-------+ Brachial 130                                      +---------+------------------+-----+----------+-------+ PTA      72                0.55 monophasic        +---------+------------------+-----+----------+-------+ DP       93                0.72 monophasic        +---------+------------------+-----+----------+-------+ Great Toe39                0.30                   +---------+------------------+-----+----------+-------+ +-------+-----------+-----------+------------+------------+ ABI/TBIToday's ABIToday's TBIPrevious ABIPrevious TBI +-------+-----------+-----------+------------+------------+ Right  AKA                   0.00        amputation   +-------+-----------+-----------+------------+------------+ Left   0.72       0.30       0.74        0.29         +-------+-----------+-----------+------------+------------+  Left ABIs and TBIs appear essentially unchanged compared to prior study on 09/27/2023.  Summary: Left: Resting left ankle-brachial index indicates moderate left lower extremity arterial disease. The left toe-brachial index is abnormal. Limited imaging showed occluded SFA and patent mid to distal popliteal artery which fills via collaterals. *See table(s) above for measurements and observations.  Electronically signed by Mikki Alexander MD on 02/07/2024 at 10:15:27 AM.    Final        Assessment & Plan:   1. Non-healing wound of amputation stump (HCC) (Primary) The patient's wound has progressed  significantly.  He is no longer utilizing his wound VAC.  The wound is very small.  Will reculture the wound as the previous culture was not taken by transport and so it was unable to be used.  He will continue with Aquacel to the wound to be changed every day or every other day.  Will have him return in 3 to 4 weeks with noninvasive studies.   Current Outpatient Medications on File Prior to Visit  Medication Sig Dispense Refill   apixaban  (ELIQUIS ) 5 MG TABS tablet Take 1 tablet (5 mg total) by mouth 2 (two) times  daily. 60 tablet 3   ascorbic acid  (VITAMIN C ) 500 MG tablet Take 1 tablet (500 mg total) by mouth daily. 30 tablet 2   aspirin  EC 81 MG tablet Take 1 tablet (81 mg total) by mouth daily. 90 tablet 1   busPIRone  (BUSPAR ) 5 MG tablet Take 1 tablet (5 mg total) by mouth 3 (three) times daily as needed (anxiety). 30 tablet 0   ciprofloxacin  (CIPRO ) 250 MG tablet Take 3 tablets (750 mg total) by mouth 2 (two) times daily. 60 tablet 0   cyanocobalamin  1000 MCG tablet Take 1 tablet (1,000 mcg total) by mouth daily. 30 tablet 2   dexlansoprazole  (DEXILANT ) 60 MG capsule Take 1 capsule (60 mg total) by mouth as needed. 30 capsule 0   iron  polysaccharides (NIFEREX) 150 MG capsule Take 1 capsule (150 mg total) by mouth daily. 30 capsule 2   ondansetron  (ZOFRAN ) 4 MG tablet Take 1 tablet (4 mg total) by mouth every 8 (eight) hours as needed for nausea or vomiting. 20 tablet 0   Vitamin D , Ergocalciferol , (DRISDOL ) 1.25 MG (50000 UNIT) CAPS capsule Take 1 capsule (50,000 Units total) by mouth every 7 (seven) days. 12 capsule 0   atorvastatin  (LIPITOR ) 40 MG tablet Take 1 tablet (40 mg total) by mouth daily. 30 tablet 1   gabapentin  (NEURONTIN ) 300 MG capsule Take 1 capsule (300 mg total) by mouth 2 (two) times daily. 60 capsule 1   No current facility-administered medications on file prior to visit.    There are no Patient Instructions on file for this visit. No follow-ups on file.   Kirt Chew E  Mariangela Heldt, NP

## 2024-03-12 NOTE — Telephone Encounter (Signed)
 [employer] called to get a wound vac removal date for Brendan Williams. Which was 02/26/24. Caller stated that he will call and see if [name] needed help with shipping equipment back.

## 2024-03-13 LAB — AEROBIC CULTURE

## 2024-03-14 ENCOUNTER — Ambulatory Visit (INDEPENDENT_AMBULATORY_CARE_PROVIDER_SITE_OTHER): Admitting: Nurse Practitioner

## 2024-03-18 ENCOUNTER — Other Ambulatory Visit (INDEPENDENT_AMBULATORY_CARE_PROVIDER_SITE_OTHER): Payer: Self-pay | Admitting: Nurse Practitioner

## 2024-03-20 NOTE — Telephone Encounter (Signed)
 This shoud be filled with patient's PCP.

## 2024-03-25 ENCOUNTER — Telehealth (INDEPENDENT_AMBULATORY_CARE_PROVIDER_SITE_OTHER): Payer: Self-pay

## 2024-03-25 NOTE — Telephone Encounter (Signed)
 Patient left a message requesting for pain medication refill. Patient is currently taking Oxycodone  7.5-325 mg. Please Advise

## 2024-03-25 NOTE — Telephone Encounter (Signed)
 I left a message on patient voicemail to return call pertaining to note below

## 2024-03-26 NOTE — Telephone Encounter (Signed)
 Patient was notified with medical recommendations and verbalized that he would like to moved forward with pain clinic referral.

## 2024-03-28 ENCOUNTER — Other Ambulatory Visit (INDEPENDENT_AMBULATORY_CARE_PROVIDER_SITE_OTHER): Payer: Self-pay | Admitting: Nurse Practitioner

## 2024-03-28 ENCOUNTER — Ambulatory Visit (INDEPENDENT_AMBULATORY_CARE_PROVIDER_SITE_OTHER): Payer: Self-pay | Admitting: Nurse Practitioner

## 2024-03-28 DIAGNOSIS — G629 Polyneuropathy, unspecified: Secondary | ICD-10-CM

## 2024-03-28 DIAGNOSIS — Z89611 Acquired absence of right leg above knee: Secondary | ICD-10-CM

## 2024-03-28 NOTE — Telephone Encounter (Signed)
 sent

## 2024-03-28 NOTE — Progress Notes (Signed)
 Let him know his culture was negative

## 2024-03-28 NOTE — Progress Notes (Signed)
 Left a detailed message on patient voicemail with culture results and referral has been sent

## 2024-04-03 ENCOUNTER — Encounter (INDEPENDENT_AMBULATORY_CARE_PROVIDER_SITE_OTHER): Payer: Self-pay | Admitting: Nurse Practitioner

## 2024-04-03 ENCOUNTER — Ambulatory Visit (INDEPENDENT_AMBULATORY_CARE_PROVIDER_SITE_OTHER): Admitting: Nurse Practitioner

## 2024-04-03 ENCOUNTER — Ambulatory Visit (INDEPENDENT_AMBULATORY_CARE_PROVIDER_SITE_OTHER)

## 2024-04-03 VITALS — BP 92/70 | HR 77 | Resp 16

## 2024-04-03 DIAGNOSIS — G547 Phantom limb syndrome without pain: Secondary | ICD-10-CM | POA: Diagnosis not present

## 2024-04-03 DIAGNOSIS — G629 Polyneuropathy, unspecified: Secondary | ICD-10-CM | POA: Diagnosis not present

## 2024-04-03 DIAGNOSIS — Z89611 Acquired absence of right leg above knee: Secondary | ICD-10-CM

## 2024-04-03 DIAGNOSIS — I70221 Atherosclerosis of native arteries of extremities with rest pain, right leg: Secondary | ICD-10-CM | POA: Diagnosis not present

## 2024-04-04 LAB — VAS US ABI WITH/WO TBI: Left ABI: 0.64

## 2024-04-06 ENCOUNTER — Encounter (INDEPENDENT_AMBULATORY_CARE_PROVIDER_SITE_OTHER): Payer: Self-pay | Admitting: Nurse Practitioner

## 2024-04-06 NOTE — Progress Notes (Signed)
 Subjective:    Patient ID: Brendan Williams, male    DOB: Jul 24, 1961, 63 y.o.   MRN: [id number] Chief Complaint  Patient presents with   Follow-up    3-4 week abi follow up    Patient returns today for follow-up evaluation of his right above-knee amputation site.  Today wound appears to be healed.  He does have an area of irritation where his tape was.  He previously had cultures of the wound and it was negative.  He had ABIs done today which are fairly consistent with his previous studies done in April.  He has a left ABI 0.64 worse the previous was 0.72.  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  He is currently denying any claudication, rest pain or any development of any ischemic symptoms.    Review of Systems  Neurological:  Positive for numbness.  All other systems reviewed and are negative.      Objective:   Physical Exam Vitals reviewed.  HENT:     Head: Normocephalic.   Cardiovascular:     Rate and Rhythm: Normal rate.  Pulmonary:     Effort: Pulmonary effort is normal.   Skin:    General: Skin is warm and dry.   Neurological:     Mental Status: He is alert and oriented to person, place, and time.     Gait: Gait abnormal.   Psychiatric:        Mood and Affect: Mood normal.        Behavior: Behavior normal.        Thought Content: Thought content normal.        Judgment: Judgment normal.     BP 92/70   Pulse 77   Resp 16   Past Medical History:  Diagnosis Date   Anxiety    situational   Asthma    as a child   Atherosclerosis of native arteries of extremity with intermittent claudication (HCC)    Cellulitis and abscess of toe of right foot    Critical limb ischemia of right lower extremity (HCC)    Current smoker    Quit 04/16/23   Peripheral arterial disease (HCC)     Social History   Socioeconomic History   Marital status: Married    Spouse name: Kissel,BRENDA (Spouse)   Number of children: Not on file   Years of education: Not on file    Highest education level: Not on file  Occupational History   Occupation: gutter work  Tobacco Use   Smoking status: Former    Current packs/day: 0.00    Average packs/day: 1 pack/day for 40.5 years (40.5 ttl pk-yrs)    Types: Cigarettes    Start date: 82    Quit date: 04/16/2023    Years since quitting: 0.9   Smokeless tobacco: Never   Tobacco comments:    Last smoked 3-4 days ago  Vaping Use   Vaping status: Never Used  Substance and Sexual Activity   Alcohol use: Yes    Alcohol/week: 5.0 standard drinks of alcohol    Types: 5 Cans of beer per week    Comment: 4-5 beers a week   Drug use: Yes    Frequency: 7.0 times per week    Types: Marijuana    Comment: a joint daily   Sexual activity: Yes  Other Topics Concern   Not on file  Social History Narrative   Live in private residence with wife, Erminio. Had grandson and step  daughter in the home   Social Drivers of Health   Financial Resource Strain: Not on file  Food Insecurity: Patient Declined (12/27/2023)   Hunger Vital Sign    Worried About Running Out of Food in the Last Year: Patient declined    Ran Out of Food in the Last Year: Patient declined  Transportation Needs: Patient Declined (12/27/2023)   PRAPARE - Administrator, Civil Service (Medical): Patient declined    Lack of Transportation (Non-Medical): Patient declined  Physical Activity: Not on file  Stress: Not on file  Social Connections: Not on file  Intimate Partner Violence: Patient Declined (12/27/2023)   Humiliation, Afraid, Rape, and Kick questionnaire    Fear of Current or Ex-Partner: Patient declined    Emotionally Abused: Patient declined    Physically Abused: Patient declined    Sexually Abused: Patient declined    Past Surgical History:  Procedure Laterality Date   AMPUTATION Right 10/12/2023   Procedure: AMPUTATION ABOVE KNEE;  Surgeon: Marea Selinda RAMAN, MD;  Location: [facility];  Service: General;  Laterality: Right;   AMPUTATION TOE  Right 12/02/2019   Procedure: AMPUTATION RIGHT 5TH TOE;  Surgeon: Lennie Barter, DPM;  Location: [facility];  Service: Podiatry;  Laterality: Right;   APPLICATION OF WOUND VAC Bilateral 11/28/2019   Procedure: APPLICATION OF WOUND VAC;  Surgeon: Marea Selinda RAMAN, MD;  Location: [facility];  Service: Vascular;  Laterality: Bilateral;  Prevena    APPLICATION OF WOUND VAC Right 12/31/2023   Procedure: APPLICATION, WOUND VAC;  Surgeon: Marea Selinda RAMAN, MD;  Location: [facility];  Service: Vascular;  Laterality: Right;   BACK SURGERY     lower ruptured disk   CENTRAL VENOUS CATHETER INSERTION  11/28/2019   Procedure: INSERTION CENTRAL LINE ADULT;  Surgeon: Marea Selinda RAMAN, MD;  Location: [facility];  Service: Vascular;;   ENDARTERECTOMY FEMORAL Bilateral 11/28/2019   Procedure: ENDARTERECTOMY FEMORAL;  Surgeon: Marea Selinda RAMAN, MD;  Location: [facility];  Service: Vascular;  Laterality: Bilateral;   ENDARTERECTOMY FEMORAL Right 05/23/2023   Procedure: ENDARTERECTOMY FEMORAL REDO;  Surgeon: Marea Selinda RAMAN, MD;  Location: [facility];  Service: Vascular;  Laterality: Right;   ENDOVASCULAR REPAIR/STENT GRAFT Right 05/23/2023   Procedure: ENDOVASCULAR REPAIR/STENT GRAFT;  Surgeon: Marea Selinda RAMAN, MD;  Location: [facility];  Service: Vascular;  Laterality: Right;  Right SFA Stents   INCISION AND DRAINAGE OF WOUND Right 12/31/2023   Procedure: IRRIGATION AND DEBRIDEMENT WOUND;  Surgeon: Marea Selinda RAMAN, MD;  Location: [facility];  Service: Vascular;  Laterality: Right;  RIGHT AKA STUMP WOUND   LOWER EXTREMITY ANGIOGRAPHY Right 11/14/2019   Procedure: LOWER EXTREMITY ANGIOGRAPHY;  Surgeon: Marea Selinda RAMAN, MD;  Location: [facility];  Service: Cardiovascular;  Laterality: Right;   LOWER EXTREMITY ANGIOGRAPHY Right 12/19/2021   Procedure: Lower Extremity Angiography;  Surgeon: Marea Selinda RAMAN, MD;  Location: [facility];  Service: Cardiovascular;  Laterality: Right;   LOWER EXTREMITY ANGIOGRAPHY Right 01/30/2022   Procedure: Lower  Extremity Angiography;  Surgeon: Marea Selinda RAMAN, MD;  Location: [facility];  Service: Cardiovascular;  Laterality: Right;   LOWER EXTREMITY ANGIOGRAPHY Right 05/21/2023   Procedure: Lower Extremity Angiography;  Surgeon: Marea Selinda RAMAN, MD;  Location: [facility];  Service: Cardiovascular;  Laterality: Right;   LOWER EXTREMITY ANGIOGRAPHY Right 09/28/2023   Procedure: Lower Extremity Angiography;  Surgeon: Marea Selinda RAMAN, MD;  Location: [facility];  Service: Cardiovascular;  Laterality: Right;  LOWER EXTREMITY ANGIOGRAPHY Left 12/27/2023   Procedure: Lower Extremity Angiography;  Surgeon: Marea Selinda RAMAN, MD;  Location: [facility];  Service: Cardiovascular;  Laterality: Left;   MECHANICAL THROMBECTOMY WITH AORTOGRAM AND INTERVENTION  05/23/2023   Procedure: MECHANICAL THROMBECTOMY right popliteal artery;  Surgeon: Marea Selinda RAMAN, MD;  Location: [facility];  Service: Vascular;;   STOMACH SURGERY     exploratory   TONSILECTOMY, ADENOIDECTOMY, BILATERAL MYRINGOTOMY AND TUBES     TONSILLECTOMY     TRANSMETATARSAL AMPUTATION Right 06/21/2023   Procedure: TRANSMETATARSAL AMPUTATION;  Surgeon: Lennie Barter, DPM;  Location[facility];  Service: Orthopedics/Podiatry;  Laterality: Right;    Family History  Problem Relation Age of Onset   Leukemia Mother    Heart disease Father        a. CABG   Heart disease Sister        a. MVR   Cancer Sister    Cancer Brother    CAD Brother        a. CABG    No Known Allergies     Latest Ref Rng & Units 01/01/2024    3:59 AM 12/31/2023    2:20 AM 12/30/2023    4:51 AM  CBC  WBC 4.0 - 10.5 K/uL 9.3  9.1  9.4   Hemoglobin 13.0 - 17.0 g/dL 7.8  8.2  8.0   Hematocrit 39.0 - 52.0 % 23.9  25.3  24.5   Platelets 150 - 400 K/uL 325  281  261       CMP     Component Value Date/Time   NA 137 01/01/2024 0359   K 3.7 01/01/2024 0359   CL 104 01/01/2024 0359   CO2 26 01/01/2024 0359   GLUCOSE 119 (H) 01/01/2024 0359    BUN 7 (L) 01/01/2024 0359   CREATININE 0.68 01/01/2024 0359   CALCIUM  9.1 01/01/2024 0359   PROT 7.3 12/26/2023 1231   ALBUMIN  3.5 12/26/2023 1231   AST 29 12/26/2023 1231   ALT 13 12/26/2023 1231   ALKPHOS 73 12/26/2023 1231   BILITOT 1.9 (H) 12/26/2023 1231   GFRNONAA >60 01/01/2024 0359     VAS US  ABI WITH/WO TBI Result Date: 04/04/2024  LOWER EXTREMITY DOPPLER STUDY Patient Name:  KAVEN CUMBIE  Date of Exam:   04/03/2024 Medical Rec #: [id number]         Accession #:    [id number] Date of Birth: 07/03/1961          Patient Gender: M Patient Age:   38 years Exam Location:  [facility] Procedure:      VAS US  ABI WITH/WO TBI Referring Phys: Selinda Marea --------------------------------------------------------------------------------  Indications: Claudication, rest pain, peripheral artery disease, and Severe pain              right leg. High Risk Factors: Hyperlipidemia. Other Factors: Right AKA.  Vascular Interventions: 05/28/2023: right SFA atherectomy, stent, ATA PTA                          11/14/19: Bilateral CIA and EIA Stents.                          11/28/2019: Aorta, Bilateral CIA, Bilateral EIA and                         Bilateral SFA Stents with Right ATA Angioplasty.  01/2022 rt thrombectomy and new stents sfa pop. Comparison Study: 02/06/2024 Performing Technologist: Leafy Gibes RVS  Examination Guidelines: A complete evaluation includes at minimum, Doppler waveform signals and systolic blood pressure reading at the level of bilateral brachial, anterior tibial, and posterior tibial arteries, when vessel segments are accessible. Bilateral testing is considered an integral part of a complete examination. Photoelectric Plethysmograph (PPG) waveforms and toe systolic pressure readings are included as required and additional duplex testing as needed. Limited examinations for reoccurring indications may be performed as noted.  ABI Findings:  +--------+------------------+-----+--------+--------+ Right   Rt Pressure (mmHg)IndexWaveformComment  +--------+------------------+-----+--------+--------+ [id number]                                     +--------+------------------+-----+--------+--------+ +---------+------------------+-----+----------+-------+ Left     Lt Pressure (mmHg)IndexWaveform  Comment +---------+------------------+-----+----------+-------+ Brachial 140                                      +---------+------------------+-----+----------+-------+ ATA      69                0.49 monophasic        +---------+------------------+-----+----------+-------+ PTA      89                0.64 monophasic        +---------+------------------+-----+----------+-------+ Great Toe94                0.67 Abnormal          +---------+------------------+-----+----------+-------+ +-------+-----------+-----------+------------+------------+ ABI/TBIToday's ABIToday's TBIPrevious ABIPrevious TBI +-------+-----------+-----------+------------+------------+ Right  Rt AKA                Rt AKA                   +-------+-----------+-----------+------------+------------+ Left   .64        .67        .72         .30          +-------+-----------+-----------+------------+------------+ Left ABIs appear decreased compared to prior study on 02/06/2024. Left TBIs appear increased compared to prior study on 02/06/2024.  Summary: Right: Rt AKA. Left: Resting left ankle-brachial index indicates moderate left lower extremity arterial disease. The left toe-brachial index is abnormal. *See table(s) above for measurements and observations.  Electronically signed by Selinda Gu MD on 04/04/2024 at 9:04:08 AM.    Final    VAS US  ABI WITH/WO TBI Result Date: 02/07/2024  LOWER EXTREMITY DOPPLER STUDY Patient Name:  HUE FRICK  Date of Exam:   02/06/2024 Medical Rec #: [id number]         Accession #:    [id number] Date of  Birth: October 05, 1961          Patient Gender: M Patient Age:   75 years [facility] Procedure:      VAS US  ABI WITH/WO TBI Referring Phys: SELINDA GU --------------------------------------------------------------------------------  Indications: Claudication, rest pain, peripheral artery disease, and Severe pain              right leg. High Risk Factors: Hyperlipidemia, past history of smoking. Other Factors: Right AKA.  Vascular Interventions: 05/28/2023: right SFA atherectomy, stent, ATA PTA  11/14/19: Bilateral CIA and EIA Stents.                          11/28/2019: Aorta, Bilateral CIA, Bilateral EIA and                         Bilateral SFA Stents with Right ATA Angioplasty.                         01/2022 rt thrombectomy and new stents sfa pop. Performing Technologist: Donnice Charnley RVT  Examination Guidelines: A complete evaluation includes at minimum, Doppler waveform signals and systolic blood pressure reading at the level of bilateral brachial, anterior tibial, and posterior tibial arteries, when vessel segments are accessible. Bilateral testing is considered an integral part of a complete examination. Photoelectric Plethysmograph (PPG) waveforms and toe systolic pressure readings are included as required and additional duplex testing as needed. Limited examinations for reoccurring indications may be performed as noted.  ABI Findings: +--------+------------------+-----+--------+--------+ Right   Rt Pressure (mmHg)IndexWaveformComment  +--------+------------------+-----+--------+--------+ [id number]                                     +--------+------------------+-----+--------+--------+ +---------+------------------+-----+----------+-------+ Left     Lt Pressure (mmHg)IndexWaveform  Comment +---------+------------------+-----+----------+-------+ Brachial 130                                       +---------+------------------+-----+----------+-------+ PTA      72                0.55 monophasic        +---------+------------------+-----+----------+-------+ DP       93                0.72 monophasic        +---------+------------------+-----+----------+-------+ Great Toe39                0.30                   +---------+------------------+-----+----------+-------+ +-------+-----------+-----------+------------+------------+ ABI/TBIToday's ABIToday's TBIPrevious ABIPrevious TBI +-------+-----------+-----------+------------+------------+ Right  AKA                   0.00        amputation   +-------+-----------+-----------+------------+------------+ Left   0.72       0.30       0.74        0.29         +-------+-----------+-----------+------------+------------+  Left ABIs and TBIs appear essentially unchanged compared to prior study on 09/27/2023.  Summary: Left: Resting left ankle-brachial index indicates moderate left lower extremity arterial disease. The left toe-brachial index is abnormal. Limited imaging showed occluded SFA and patent mid to distal popliteal artery which fills via collaterals. *See table(s) above for measurements and observations.  Electronically signed by Selinda Gu MD on 02/07/2024 at 10:15:27 AM.    Final        Assessment & Plan:   1. Neuropathy (Primary) As noted below we will refer the patient to the pain clinic. - Ambulatory referral to Pain Clinic  2. Phantom limb [facility]) The patient continues to have significant pain post amputation.  I believe this is related to phantom limb pain.  Will refer the patient to her pain clinic. -  Ambulatory referral to Pain Clinic   3. Hx of AKA (above knee amputation), right [facility]) Mr. Pavone was seen for further evaluation for right above knee prosthesis.He Is a 62 year old male who had amputation on 10/11/2024.  At the time he is well healed and ready for fitting of new above knee prosthesis.  He is  a highly motivated individual and should do well once fitted with prosthesis.  She has no problem returning to a K2 ambulator, which will allow him to walk inside and outside of his home and overload level barriers.   Current Outpatient Medications on File Prior to Visit  Medication Sig Dispense Refill   apixaban  (ELIQUIS ) 5 MG TABS tablet Take 1 tablet (5 mg total) by mouth 2 (two) times daily. 60 tablet 3   aspirin  EC 81 MG tablet Take 1 tablet (81 mg total) by mouth daily. 90 tablet 1   atorvastatin  (LIPITOR ) 40 MG tablet TAKE 1 TABLET BY MOUTH ONCE EVERY EVENING 30 tablet 1   busPIRone  (BUSPAR ) 5 MG tablet Take 1 tablet (5 mg total) by mouth 3 (three) times daily as needed (anxiety). 30 tablet 0   ciprofloxacin  (CIPRO ) 250 MG tablet Take 3 tablets (750 mg total) by mouth 2 (two) times daily. 60 tablet 0   dexlansoprazole  (DEXILANT ) 60 MG capsule Take 1 capsule (60 mg total) by mouth as needed. 30 capsule 0   gabapentin  (NEURONTIN ) 300 MG capsule Take 1 capsule (300 mg total) by mouth 2 (two) times daily. 60 capsule 1   iron  polysaccharides (NIFEREX) 150 MG capsule Take 1 capsule (150 mg total) by mouth daily. 30 capsule 2   ondansetron  (ZOFRAN ) 4 MG tablet Take 1 tablet (4 mg total) by mouth every 8 (eight) hours as needed for nausea or vomiting. 20 tablet 0   oxyCODONE -acetaminophen  (PERCOCET) 7.5-325 MG tablet Take 1 tablet by mouth every 4 (four) hours as needed for severe pain (pain score 7-10). (Patient not taking: Reported on 04/03/2024) 35 tablet 0   No current facility-administered medications on file prior to visit.    There are no Patient Instructions on file for this visit. No follow-ups on file.   Kelsha Older E Macaria Bias, NP

## 2024-04-24 ENCOUNTER — Telehealth: Payer: Self-pay

## 2024-04-24 ENCOUNTER — Encounter: Attending: Physician Assistant | Admitting: Physician Assistant

## 2024-04-24 DIAGNOSIS — L97822 Non-pressure chronic ulcer of other part of left lower leg with fat layer exposed: Secondary | ICD-10-CM | POA: Diagnosis not present

## 2024-04-24 DIAGNOSIS — Z7901 Long term (current) use of anticoagulants: Secondary | ICD-10-CM | POA: Insufficient documentation

## 2024-04-24 DIAGNOSIS — I7389 Other specified peripheral vascular diseases: Secondary | ICD-10-CM | POA: Insufficient documentation

## 2024-04-24 DIAGNOSIS — Z89611 Acquired absence of right leg above knee: Secondary | ICD-10-CM | POA: Insufficient documentation

## 2024-04-24 DIAGNOSIS — R7303 Prediabetes: Secondary | ICD-10-CM | POA: Diagnosis not present

## 2024-04-24 NOTE — Telephone Encounter (Signed)
 Left message to call back for New Patient Appointment

## 2024-05-01 ENCOUNTER — Ambulatory Visit: Admitting: Physician Assistant

## 2024-05-09 ENCOUNTER — Ambulatory Visit (INDEPENDENT_AMBULATORY_CARE_PROVIDER_SITE_OTHER): Admitting: Nurse Practitioner

## 2024-05-09 ENCOUNTER — Encounter (INDEPENDENT_AMBULATORY_CARE_PROVIDER_SITE_OTHER)

## 2024-07-03 ENCOUNTER — Other Ambulatory Visit (INDEPENDENT_AMBULATORY_CARE_PROVIDER_SITE_OTHER): Payer: Self-pay | Admitting: Nurse Practitioner

## 2024-07-03 DIAGNOSIS — I739 Peripheral vascular disease, unspecified: Secondary | ICD-10-CM

## 2024-07-03 DIAGNOSIS — Z89611 Acquired absence of right leg above knee: Secondary | ICD-10-CM

## 2024-07-04 ENCOUNTER — Ambulatory Visit (INDEPENDENT_AMBULATORY_CARE_PROVIDER_SITE_OTHER): Admitting: Nurse Practitioner

## 2024-07-04 ENCOUNTER — Encounter (INDEPENDENT_AMBULATORY_CARE_PROVIDER_SITE_OTHER)

## 2024-07-06 DIAGNOSIS — M899 Disorder of bone, unspecified: Secondary | ICD-10-CM | POA: Insufficient documentation

## 2024-07-06 DIAGNOSIS — Z789 Other specified health status: Secondary | ICD-10-CM | POA: Insufficient documentation

## 2024-07-06 DIAGNOSIS — G894 Chronic pain syndrome: Secondary | ICD-10-CM | POA: Insufficient documentation

## 2024-07-06 DIAGNOSIS — Z79899 Other long term (current) drug therapy: Secondary | ICD-10-CM | POA: Insufficient documentation

## 2024-07-06 NOTE — Progress Notes (Unsigned)
 PROVIDER NOTE: Interpretation of information contained herein should be left to medically-trained personnel. Specific patient instructions are provided elsewhere under Patient Instructions section of medical record. This document was created in part using AI and STT-dictation technology, any transcriptional errors that may result from this process are unintentional.  Patient: Brendan Williams Paras  Service: E/M Encounter  Provider: Eric DELENA Como, MD  DOB: 07/28/1961  Delivery: Face-to-face  Specialty: Interventional Pain Management  MRN: [id number]  Setting: Ambulatory outpatient facility  Specialty designation: 09  Type: New Patient  Location: Outpatient office facility  PCP: Autry Grayce DELENA, PA  DOS: 07/07/2024    Referring Prov.: Brown, Fallon E, NP   Primary Reason(s) for Visit: Encounter for initial evaluation of one or more chronic problems (new to examiner) potentially causing chronic pain, and posing a threat to normal musculoskeletal function. (Level of risk: High) CC: No chief complaint on file.  HPI  Brendan Williams is a 63 y.o. year old, male patient, who comes for the first time to our practice referred by Brown, Fallon E, NP for our initial evaluation of his chronic pain. He has Tobacco use disorder; Atherosclerosis of native arteries of extremity with intermittent claudication (HCC); Atherosclerosis of native arteries of extremity with rest pain (HCC); PAD (peripheral artery disease) (HCC); Critical limb ischemia of right lower extremity (HCC); Sepsis (HCC); Cellulitis and abscess of toe of right foot; Acute lower limb ischemia; Chronic anticoagulation; Hyperlipidemia; Neuropathy; Elevated ETOH level; Lactic acidosis; Lower extremity cellulitis; Critical limb ischemia of left lower extremity (HCC); Non-healing wound of amputation stump (HCC); Hx of AKA (above knee amputation), right (HCC); Chronic pain syndrome; Pharmacologic therapy; Disorder of skeletal system; and Problems influencing  health status on their problem list. Today he comes in for evaluation of his No chief complaint on file.  Pain Assessment: Location:     Radiating:   Onset:   Duration:   Quality:   Severity:  /10 (subjective, self-reported pain score)  Effect on ADL:   Timing:   Modifying factors:   BP:    HR:    Onset and Duration: {Hx; Onset and Duration:210120511} Cause of pain: {Hx; Cause:210120521} Severity: {Pain Severity:210120502} Timing: {Symptoms; Timing:210120501} Aggravating Factors: {Causes; Aggravating pain factors:210120507} Alleviating Factors: {Causes; Alleviating Factors:210120500} Associated Problems: {Hx; Associated problems:210120515} Quality of Pain: {Hx; Symptom quality or Descriptor:210120531} Previous Examinations or Tests: {Hx; Previous examinations or test:210120529} Previous Treatments: {Hx; Previous Treatment:210120503}  Brendan Williams is being evaluated for possible interventional pain management therapies for the treatment of his chronic pain.  Discussed the use of AI scribe software for clinical note transcription with the patient, who gave verbal consent to proceed.  History of Present Illness            Brendan Williams has been informed that this initial visit was an evaluation only.  On the follow up appointment I will go over the results, including ordered tests and available interventional therapies. At that time he will have the opportunity to decide whether to proceed with offered therapies or not. In the event that Mr. Silva prefers avoiding interventional options, this will conclude our involvement in the case.  Medication management recommendations may be provided upon request.  Patient informed that diagnostic tests may be ordered to assist in identifying underlying causes, narrow the list of differential diagnoses and aid in determining candidacy for (or contraindications to) planned therapeutic interventions.  Historic Controlled Substance  Pharmacotherapy Review PMP and historical list of controlled substances: Oxycodone -Acetaminophen  7.5-32 ; Oxycodone -Acetaminophen  5-325 ;Hydrocodone -Acetaminophen  5-32 ;Gabapentin   300 Mg Capsule  Most recently prescribed controlled substance(s): Opioid Analgesic: *** MME/day: *** mg/day  Historical Monitoring: The patient  reports current drug use. Frequency: 7.00 times per week. Drug: Marijuana. List of prior UDS Testing: No results found for: MDMA, COCAINSCRNUR, PCPSCRNUR, PCPQUANT, CANNABQUANT, THCU, ETH, CBDTHCR, D8THCCBX, D9THCCBX Historical Background Evaluation: [place] PMP: PDMP reviewed during this encounter. Review of the past 43-months conducted.             PMP NARX Score Report:  Narcotic: 270 Sedative: 120 Stimulant: 000 [place] Department of public safety, offender search: Engineer, mining Information) Non-contributory Risk Assessment Profile: Aberrant behavior: None observed or detected today Risk factors for fatal opioid overdose: None identified today PMP NARX Overdose Risk Score: 270 Fatal overdose hazard ratio (HR): Calculation deferred Non-fatal overdose hazard ratio (HR): Calculation deferred Risk of opioid abuse or dependence: 0.7-3.0% with doses <= 36 MME/day and 6.1-26% with doses >= 120 MME/day. Substance use disorder (SUD) risk level: See below Personal History of Substance Abuse (SUD-Substance use disorder):  Alcohol:    Illegal Drugs:    Rx Drugs:    ORT Risk Level calculation:    ORT Scoring interpretation table:  Score <3 = Low Risk for SUD  Score between 4-7 = Moderate Risk for SUD  Score >8 = High Risk for Opioid Abuse   PHQ-2 Depression Scale:  Total score:    PHQ-2 Scoring interpretation table: (Score and probability of major depressive disorder)  Score 0 = No depression  Score 1 = 15.4% Probability  Score 2 = 21.1% Probability  Score 3 = 38.4% Probability  Score 4 = 45.5% Probability  Score 5 = 56.4% Probability  Score 6 = 78.6%  Probability   PHQ-9 Depression Scale:  Total score:    PHQ-9 Scoring interpretation table:  Score 0-4 = No depression  Score 5-9 = Mild depression  Score 10-14 = Moderate depression  Score 15-19 = Moderately severe depression  Score 20-27 = Severe depression (2.4 times higher risk of SUD and 2.89 times higher risk of overuse)   Pharmacologic Plan: As per protocol, I have not taken over any controlled substance management, pending the results of ordered tests and/or consults.            Initial impression: Pending review of available data and ordered tests.  Meds   Current Outpatient Medications:    apixaban  (ELIQUIS ) 5 MG TABS tablet, Take 1 tablet (5 mg total) by mouth 2 (two) times daily., Disp: 60 tablet, Rfl: 3   aspirin  EC 81 MG tablet, Take 1 tablet (81 mg total) by mouth daily., Disp: 90 tablet, Rfl: 1   atorvastatin  (LIPITOR ) 40 MG tablet, TAKE 1 TABLET BY MOUTH ONCE EVERY EVENING, Disp: 30 tablet, Rfl: 1   busPIRone  (BUSPAR ) 5 MG tablet, Take 1 tablet (5 mg total) by mouth 3 (three) times daily as needed (anxiety)., Disp: 30 tablet, Rfl: 0   ciprofloxacin  (CIPRO ) 250 MG tablet, Take 3 tablets (750 mg total) by mouth 2 (two) times daily., Disp: 60 tablet, Rfl: 0   dexlansoprazole  (DEXILANT ) 60 MG capsule, Take 1 capsule (60 mg total) by mouth as needed., Disp: 30 capsule, Rfl: 0   gabapentin  (NEURONTIN ) 300 MG capsule, Take 1 capsule (300 mg total) by mouth 2 (two) times daily., Disp: 60 capsule, Rfl: 1   iron  polysaccharides (NIFEREX) 150 MG capsule, Take 1 capsule (150 mg total) by mouth daily., Disp: 30 capsule, Rfl: 2   ondansetron  (ZOFRAN ) 4 MG tablet, Take 1 tablet (4 mg  total) by mouth every 8 (eight) hours as needed for nausea or vomiting., Disp: 20 tablet, Rfl: 0   oxyCODONE -acetaminophen  (PERCOCET) 7.5-325 MG tablet, Take 1 tablet by mouth every 4 (four) hours as needed for severe pain (pain score 7-10). (Patient not taking: Reported on 04/03/2024), Disp: 35 tablet, Rfl:  0  Imaging Review   Complexity Note: No results found under the [facility] electronic medical record.                         ROS  Cardiovascular: {Hx; Cardiovascular History:210120525} Pulmonary or Respiratory: {Hx; Pumonary and/or Respiratory History:210120523} Neurological: {Hx; Neurological:210120504} Psychological-Psychiatric: {Hx; Psychological-Psychiatric History:210120512} Gastrointestinal: {Hx; Gastrointestinal:210120527} Genitourinary: {Hx; Genitourinary:210120506} Hematological: {Hx; Hematological:210120510} Endocrine: {Hx; Endocrine history:210120509} Rheumatologic: {Hx; Rheumatological:210120530} Musculoskeletal: {Hx; Musculoskeletal:210120528} Work History: {Hx; Work history:210120514}  Allergies  Mr. Jaggi has no known allergies.  Laboratory Chemistry Profile   Renal Lab Results  Component Value Date   BUN 7 (L) 01/01/2024   CREATININE 0.68 01/01/2024   GFRAA >60 12/04/2019   GFRNONAA >60 01/01/2024     Electrolytes Lab Results  Component Value Date   NA 137 01/01/2024   K 3.7 01/01/2024   CL 104 01/01/2024   CALCIUM  9.1 01/01/2024   MG 2.0 01/01/2024   PHOS 3.1 01/01/2024     Hepatic Lab Results  Component Value Date   AST 29 12/26/2023   ALT 13 12/26/2023   ALBUMIN  3.5 12/26/2023   ALKPHOS 73 12/26/2023   LIPASE 24 09/01/2021     ID Lab Results  Component Value Date   HIV Non Reactive 05/19/2023   SARSCOV2NAA NEGATIVE 09/01/2021   STAPHAUREUS NEGATIVE 11/25/2019   MRSAPCR NEGATIVE 11/25/2019     Bone Lab Results  Component Value Date   VD25OH 15.06 (L) 12/28/2023     Endocrine Lab Results  Component Value Date   GLUCOSE 119 (H) 01/01/2024   HGBA1C 6.3 (H) 10/12/2023     Neuropathy Lab Results  Component Value Date   VITAMINB12 207 12/28/2023   FOLATE 15.6 12/28/2023   HGBA1C 6.3 (H) 10/12/2023   HIV Non Reactive 05/19/2023     CNS No results found for: COLORCSF, APPEARCSF, RBCCOUNTCSF, WBCCSF,  POLYSCSF, LYMPHSCSF, EOSCSF, PROTEINCSF, GLUCCSF, JCVIRUS, CSFOLI, IGGCSF, LABACHR, ACETBL   Inflammation (CRP: Acute  ESR: Chronic) Lab Results  Component Value Date   CRP 9.2 (H) 10/11/2023   ESRSEDRATE 70 (H) 10/11/2023   LATICACIDVEN 0.8 10/16/2023     Rheumatology No results found for: RF, ANA, LABURIC, URICUR, LYMEIGGIGMAB, LYMEABIGMQN, HLAB27   Coagulation Lab Results  Component Value Date   INR 1.0 12/26/2023   LABPROT 13.4 12/26/2023   APTT 35 12/26/2023   PLT 325 01/01/2024     Cardiovascular Lab Results  Component Value Date   HGB 7.8 (L) 01/01/2024   HCT 23.9 (L) 01/01/2024     Screening Lab Results  Component Value Date   SARSCOV2NAA NEGATIVE 09/01/2021   STAPHAUREUS NEGATIVE 11/25/2019   MRSAPCR NEGATIVE 11/25/2019   HIV Non Reactive 05/19/2023     Cancer No results found for: CEA, CA125, LABCA2   Allergens No results found for: ALMOND, APPLE, ASPARAGUS, AVOCADO, BANANA, BARLEY, BASIL, BAYLEAF, GREENBEAN, LIMABEAN, WHITEBEAN, BEEFIGE, REDBEET, BLUEBERRY, BROCCOLI, CABBAGE, MELON, CARROT, CASEIN, CASHEWNUT, CAULIFLOWER, CELERY     Note: Lab results reviewed.  PFSH  Drug: Mr. Gehres  reports current drug use. Frequency: 7.00 times per week. Drug: Marijuana. Alcohol:  reports current alcohol use of about 5.0 standard drinks of alcohol  per week. Tobacco:  reports that he quit smoking about 14 months ago. His smoking use included cigarettes. He started smoking about 41 years ago. He has a 40.5 pack-year smoking history. He has never used smokeless tobacco. Medical:  has a past medical history of Anxiety, Asthma, Atherosclerosis of native arteries of extremity with intermittent claudication (HCC), Cellulitis and abscess of toe of right foot, Critical limb ischemia of right lower extremity (HCC), Current smoker, and Peripheral arterial disease (HCC). Family: family history  includes CAD in his brother; Cancer in his brother and sister; Heart disease in his father and sister; Leukemia in his mother.  Past Surgical History:  Procedure Laterality Date   AMPUTATION Right 10/12/2023   Procedure: AMPUTATION ABOVE KNEE;  Surgeon: Marea Selinda RAMAN, MD;  Location: [facility];  Service: General;  Laterality: Right;   AMPUTATION TOE Right 12/02/2019   Procedure: AMPUTATION RIGHT 5TH TOE;  Surgeon: Lennie Barter, DPM;  Location: [facility];  Service: Podiatry;  Laterality: Right;   APPLICATION OF WOUND VAC Bilateral 11/28/2019   Procedure: APPLICATION OF WOUND VAC;  Surgeon: Marea Selinda RAMAN, MD;  Location: [facility];  Service: Vascular;  Laterality: Bilateral;  Pre[place]a    APPLICATION OF WOUND VAC Right 12/31/2023   Procedure: APPLICATION, WOUND VAC;  Surgeon: Marea Selinda RAMAN, MD;  Location: [facility];  Service: Vascular;  Laterality: Right;   BACK SURGERY     lower ruptured disk   CENTRAL VENOUS CATHETER INSERTION  11/28/2019   Procedure: INSERTION CENTRAL LINE ADULT;  Surgeon: Marea Selinda RAMAN, MD;  Location: [facility];  Service: Vascular;;   ENDARTERECTOMY FEMORAL Bilateral 11/28/2019   Procedure: ENDARTERECTOMY FEMORAL;  Surgeon: Marea Selinda RAMAN, MD;  Location: [facility];  Service: Vascular;  Laterality: Bilateral;   ENDARTERECTOMY FEMORAL Right 05/23/2023   Procedure: ENDARTERECTOMY FEMORAL REDO;  Surgeon: Marea Selinda RAMAN, MD;  Location: [facility];  Service: Vascular;  Laterality: Right;   ENDOVASCULAR REPAIR/STENT GRAFT Right 05/23/2023   Procedure: ENDOVASCULAR REPAIR/STENT GRAFT;  Surgeon: Marea Selinda RAMAN, MD;  Location: [facility];  Service: Vascular;  Laterality: Right;  Right SFA Stents   INCISION AND DRAINAGE OF WOUND Right 12/31/2023   Procedure: IRRIGATION AND DEBRIDEMENT WOUND;  Surgeon: Marea Selinda RAMAN, MD;  Location: [facility];  Service: Vascular;  Laterality: Right;  RIGHT AKA STUMP WOUND   LOWER EXTREMITY ANGIOGRAPHY Right 11/14/2019   Procedure: LOWER EXTREMITY ANGIOGRAPHY;  Surgeon: Marea Selinda RAMAN, MD;  Location: [facility];  Service: Cardiovascular;  Laterality: Right;   LOWER EXTREMITY ANGIOGRAPHY Right 12/19/2021   Procedure: Lower Extremity Angiography;  Surgeon: Marea Selinda RAMAN, MD;  Location: [facility];  Service: Cardiovascular;  Laterality: Right;   LOWER EXTREMITY ANGIOGRAPHY Right 01/30/2022   Procedure: Lower Extremity Angiography;  Surgeon: Marea Selinda RAMAN, MD;  Location: [facility];  Service: Cardiovascular;  Laterality: Right;   LOWER EXTREMITY ANGIOGRAPHY Right 05/21/2023   Procedure: Lower Extremity Angiography;  Surgeon: Marea Selinda RAMAN, MD;  Location: [facility];  Service: Cardiovascular;  Laterality: Right;   LOWER EXTREMITY ANGIOGRAPHY Right 09/28/2023   Procedure: Lower Extremity Angiography;  Surgeon: Marea Selinda RAMAN, MD;  Location: [facility];  Service: Cardiovascular;  Laterality: Right;   LOWER EXTREMITY ANGIOGRAPHY Left 12/27/2023   Procedure: Lower Extremity Angiography;  Surgeon: Marea Selinda RAMAN, MD;  Location: [facility];  Service: Cardiovascular;  Laterality: Left;   MECHANICAL THROMBECTOMY WITH AORTOGRAM AND INTERVENTION  05/23/2023   Procedure: MECHANICAL THROMBECTOMY  right popliteal artery;  Surgeon: Marea Selinda RAMAN, MD;  Location: [facility];  Service: Vascular;;   STOMACH SURGERY     exploratory   TONSILECTOMY, ADENOIDECTOMY, BILATERAL MYRINGOTOMY AND TUBES     TONSILLECTOMY     TRANSMETATARSAL AMPUTATION Right 06/21/2023   Procedure: TRANSMETATARSAL AMPUTATION;  Surgeon: Lennie Barter, DPM;  Location[facility];  Service: Orthopedics/Podiatry;  Laterality: Right;   Active Ambulatory Problems    Diagnosis Date Noted   Tobacco use disorder 11/11/2019   Atherosclerosis of native arteries of extremity with intermittent claudication (HCC) 11/11/2019   Atherosclerosis of native arteries of extremity with rest pain (HCC) 11/25/2019   PAD (peripheral artery disease) (HCC) 11/25/2019   Critical limb  ischemia of right lower extremity (HCC) 05/18/2023   Sepsis (HCC) 05/18/2023   Cellulitis and abscess of toe of right foot 05/18/2023   Acute lower limb ischemia 09/27/2023   Chronic anticoagulation 09/27/2023   Hyperlipidemia 09/27/2023   Neuropathy 09/27/2023   Elevated ETOH level 09/27/2023   Lactic acidosis 10/11/2023   Lower extremity cellulitis 10/12/2023   Critical limb ischemia of left lower extremity (HCC) 12/26/2023   Non-healing wound of amputation stump (HCC) 12/26/2023   Hx of AKA (above knee amputation), right (HCC) 01/08/2024   Chronic pain syndrome 07/06/2024   Pharmacologic therapy 07/06/2024   Disorder of skeletal system 07/06/2024   Problems influencing health status 07/06/2024   Resolved Ambulatory Problems    Diagnosis Date Noted   No Resolved Ambulatory Problems   Past Medical History:  Diagnosis Date   Anxiety    Asthma    Current smoker    Peripheral arterial disease (HCC)    Constitutional Exam  General appearance: Well nourished, well developed, and well hydrated. In no apparent acute distress There were no vitals filed for this visit. BMI Assessment: Estimated body mass index is 22.43 kg/m as calculated from the following:   Height as of 01/23/24: 6' 1 (1.854 m).   Weight as of 01/23/24: 170 lb (77.1 kg).  BMI interpretation table: BMI level Category Range association with higher incidence of chronic pain  <18 kg/m2 Underweight   18.5-24.9 kg/m2 Ideal body weight   25-29.9 kg/m2 Overweight Increased incidence by 20%  30-34.9 kg/m2 Obese (Class I) Increased incidence by 68%  35-39.9 kg/m2 Severe obesity (Class II) Increased incidence by 136%  >40 kg/m2 Extreme obesity (Class III) Increased incidence by 254%   Patient's current BMI Ideal Body weight  There is no height or weight on file to calculate BMI. Patient weight not recorded   BMI Readings from Last 4 Encounters:  01/23/24 22.43 kg/m  12/26/23 22.43 kg/m  12/25/23 22.43 kg/m   12/07/23 22.43 kg/m   Wt Readings from Last 4 Encounters:  01/23/24 170 lb (77.1 kg)  12/26/23 170 lb (77.1 kg)  12/25/23 170 lb (77.1 kg)  12/07/23 170 lb (77.1 kg)    Psych/Mental status: Alert, oriented x 3 (person, place, & time)       Eyes: PERLA Respiratory: No evidence of acute respiratory distress  Assessment  Primary Diagnosis & Pertinent Problem List: The primary encounter diagnosis was Chronic pain syndrome. Diagnoses of Pharmacologic therapy, Disorder of skeletal system, and Problems influencing health status were also pertinent to this visit.  Visit Diagnosis (New problems to examiner): 1. Chronic pain syndrome   2. Pharmacologic therapy   3. Disorder of skeletal system   4. Problems influencing health status    Plan of Care (Initial workup plan)  Note: Mr. Appling  was reminded that as per protocol, today's visit has been an evaluation only. We have not taken over the patient's controlled substance management.  Problem-specific plan: Assessment and Plan            Lab Orders  No laboratory test(s) ordered today   Imaging Orders  No imaging studies ordered today   Referral Orders  No referral(s) requested today   Procedure Orders    No procedure(s) ordered today   Pharmacotherapy (current): Medications ordered:  No orders of the defined types were placed in this encounter.  Medications administered during this visit: Marcella Dunnaway. Armentrout had no medications administered during this visit.   Analgesic Pharmacotherapy:  Opioid Analgesics: For patients currently taking or requesting to take opioid analgesics, in accordance with [place]  Medical Board Guidelines, we will assess their risks and indications for the use of these substances. After completing our evaluation, we may offer recommendations, but we no longer take patients for medication management. The prescribing physician will ultimately decide, based on his/her training and level of  comfort whether to adopt any of the recommendations, including whether or not to prescribe such medicines.  Membrane stabilizer: To be determined at a later time  Muscle relaxant: To be determined at a later time  NSAID: To be determined at a later time  Other analgesic(s): To be determined at a later time   Interventional management options: Mr. Dalto was informed that there is no guarantee that he would be a candidate for interventional therapies. The decision will be based on the results of diagnostic studies, as well as Mr. Gantt's risk profile.  Procedure(s) under consideration:  Pending results of ordered studies     Interventional Therapies  Risk Factors  Considerations  Medical Comorbidities:     Planned  Pending:      Under consideration:   Pending   Completed: (Analgesic benefit)1  None at this time   Therapeutic  Palliative (PRN) options:   None established   Completed by other providers:   None reported  1(Analgesic benefit): Expressed in percentage (%). (Local anesthetic[LA] +/- sedation  L.A.Local Anesthetic  Steroid benefit  Ongoing benefit)   Provider-requested follow-up: No follow-ups on file.  Future Appointments  Date Time Provider Department Center  07/07/2024  2:00 PM Tanya Glisson, MD [facility] None   I discussed the assessment and treatment plan with the patient. The patient was provided an opportunity to ask questions and all were answered. The patient agreed with the plan and demonstrated an understanding of the instructions.  Patient advised to call back or seek an in-person evaluation if the symptoms or condition worsens.  Duration of encounter: *** minutes.  Total time on encounter, as per AMA guidelines included both the face-to-face and non-face-to-face time personally spent by the physician and/or other qualified health care professional(s) on the day of the encounter (includes time in activities that require the physician or  other qualified health care professional and does not include time in activities normally performed by clinical staff). Physician's time may include the following activities when performed: Preparing to see the patient (e.g., pre-charting review of records, searching for previously ordered imaging, lab work, and nerve conduction tests) Review of prior analgesic pharmacotherapies. Reviewing PMP Interpreting ordered tests (e.g., lab work, imaging, nerve conduction tests) Performing post-procedure evaluations, including interpretation of diagnostic procedures Obtaining and/or reviewing separately obtained history Performing a medically appropriate examination and/or evaluation Counseling and educating the patient/family/caregiver Ordering medications, tests, or procedures Referring and communicating with other  health care professionals (when not separately reported) Documenting clinical information in the electronic or other health record Independently interpreting results (not separately reported) and communicating results to the patient/ family/caregiver Care coordination (not separately reported)  Note by: Eric DELENA Como, MD (TTS and AI technology used. I apologize for any typographical errors that were not detected and corrected.) Date: 07/07/2024; Time: 2:52 PM

## 2024-07-07 ENCOUNTER — Ambulatory Visit (HOSPITAL_BASED_OUTPATIENT_CLINIC_OR_DEPARTMENT_OTHER): Admitting: Pain Medicine

## 2024-07-07 DIAGNOSIS — Z789 Other specified health status: Secondary | ICD-10-CM

## 2024-07-07 DIAGNOSIS — G8929 Other chronic pain: Secondary | ICD-10-CM

## 2024-07-07 DIAGNOSIS — Z79899 Other long term (current) drug therapy: Secondary | ICD-10-CM

## 2024-07-07 DIAGNOSIS — Z91199 Patient's noncompliance with other medical treatment and regimen due to unspecified reason: Secondary | ICD-10-CM

## 2024-07-07 DIAGNOSIS — G894 Chronic pain syndrome: Secondary | ICD-10-CM

## 2024-07-07 DIAGNOSIS — M899 Disorder of bone, unspecified: Secondary | ICD-10-CM
